# Patient Record
Sex: Female | Born: 1960 | Race: White | Hispanic: No | Marital: Married | Smoking: Never smoker
Health system: Southern US, Community
[De-identification: ages and names within clinical notes are randomized; demographics above are authoritative.]

## PROBLEM LIST (undated history)

## (undated) DIAGNOSIS — R519 Headache, unspecified: Secondary | ICD-10-CM

## (undated) DIAGNOSIS — R51 Headache: Secondary | ICD-10-CM

## (undated) DIAGNOSIS — D649 Anemia, unspecified: Secondary | ICD-10-CM

## (undated) DIAGNOSIS — I700 Atherosclerosis of aorta: Secondary | ICD-10-CM

## (undated) DIAGNOSIS — K760 Fatty (change of) liver, not elsewhere classified: Secondary | ICD-10-CM

## (undated) DIAGNOSIS — A159 Respiratory tuberculosis unspecified: Secondary | ICD-10-CM

## (undated) DIAGNOSIS — I10 Essential (primary) hypertension: Secondary | ICD-10-CM

## (undated) DIAGNOSIS — K219 Gastro-esophageal reflux disease without esophagitis: Secondary | ICD-10-CM

## (undated) DIAGNOSIS — E039 Hypothyroidism, unspecified: Secondary | ICD-10-CM

## (undated) DIAGNOSIS — T7840XA Allergy, unspecified, initial encounter: Secondary | ICD-10-CM

## (undated) HISTORY — PX: LAPAROSCOPIC APPENDECTOMY: SUR753

## (undated) HISTORY — DX: Essential (primary) hypertension: I10

## (undated) HISTORY — DX: Respiratory tuberculosis unspecified: A15.9

## (undated) HISTORY — DX: Allergy, unspecified, initial encounter: T78.40XA

## (undated) HISTORY — PX: BACK SURGERY: SHX140

## (undated) HISTORY — DX: Anemia, unspecified: D64.9

---

## 1998-05-12 ENCOUNTER — Emergency Department (HOSPITAL_COMMUNITY): Admission: EM | Admit: 1998-05-12 | Discharge: 1998-05-12 | Payer: Self-pay | Admitting: Emergency Medicine

## 2001-10-15 ENCOUNTER — Encounter: Payer: Self-pay | Admitting: Neurosurgery

## 2001-10-21 ENCOUNTER — Encounter: Payer: Self-pay | Admitting: Neurosurgery

## 2001-10-21 ENCOUNTER — Ambulatory Visit (HOSPITAL_COMMUNITY): Admission: RE | Admit: 2001-10-21 | Discharge: 2001-10-22 | Payer: Self-pay | Admitting: Neurosurgery

## 2001-11-24 ENCOUNTER — Ambulatory Visit (HOSPITAL_COMMUNITY): Admission: RE | Admit: 2001-11-24 | Discharge: 2001-11-24 | Payer: Self-pay | Admitting: Neurosurgery

## 2001-11-24 ENCOUNTER — Encounter: Payer: Self-pay | Admitting: Neurosurgery

## 2002-02-26 ENCOUNTER — Encounter: Payer: Self-pay | Admitting: Neurosurgery

## 2002-02-26 ENCOUNTER — Encounter: Admission: RE | Admit: 2002-02-26 | Discharge: 2002-02-26 | Payer: Self-pay | Admitting: Neurosurgery

## 2002-03-05 ENCOUNTER — Encounter: Admission: RE | Admit: 2002-03-05 | Discharge: 2002-05-28 | Payer: Self-pay | Admitting: Neurosurgery

## 2002-08-06 ENCOUNTER — Encounter: Admission: RE | Admit: 2002-08-06 | Discharge: 2002-09-09 | Payer: Self-pay | Admitting: Neurosurgery

## 2002-10-09 ENCOUNTER — Encounter: Admission: RE | Admit: 2002-10-09 | Discharge: 2003-01-07 | Payer: Self-pay | Admitting: Anesthesiology

## 2002-11-16 ENCOUNTER — Encounter: Admission: RE | Admit: 2002-11-16 | Discharge: 2002-11-16 | Payer: Self-pay | Admitting: *Deleted

## 2002-11-16 ENCOUNTER — Encounter: Payer: Self-pay | Admitting: *Deleted

## 2004-06-28 ENCOUNTER — Ambulatory Visit (HOSPITAL_COMMUNITY): Admission: RE | Admit: 2004-06-28 | Discharge: 2004-06-29 | Payer: Self-pay | Admitting: Neurosurgery

## 2004-08-02 ENCOUNTER — Encounter: Admission: RE | Admit: 2004-08-02 | Discharge: 2004-08-02 | Payer: Self-pay | Admitting: Neurosurgery

## 2004-08-02 IMAGING — CR DG CERVICAL SPINE 1V
1 series · 1 of 1 positions shown · non-contrast
Comparison: none

CLINICAL DATA: Anterior cervical diskectomy and fusion, follow up evaluation.
 LATERAL CERVICAL SPINE ? [DATE] 
 There has been anterior cervical diskectomy and fusion at the C3-4 and C4-5 levels.  Position and alignment appear satisfactory, and anterior plate and screws appear intact.  There has been previous fusion at the C5-6 level, and there is C6-7 degenerative disk space narrowing noted.  
 IMPRESSION
 Stable appearance of anterior cervical diskectomy and fusion at C3-4 and C4-5 levels, as discussed.

[view not recorded]
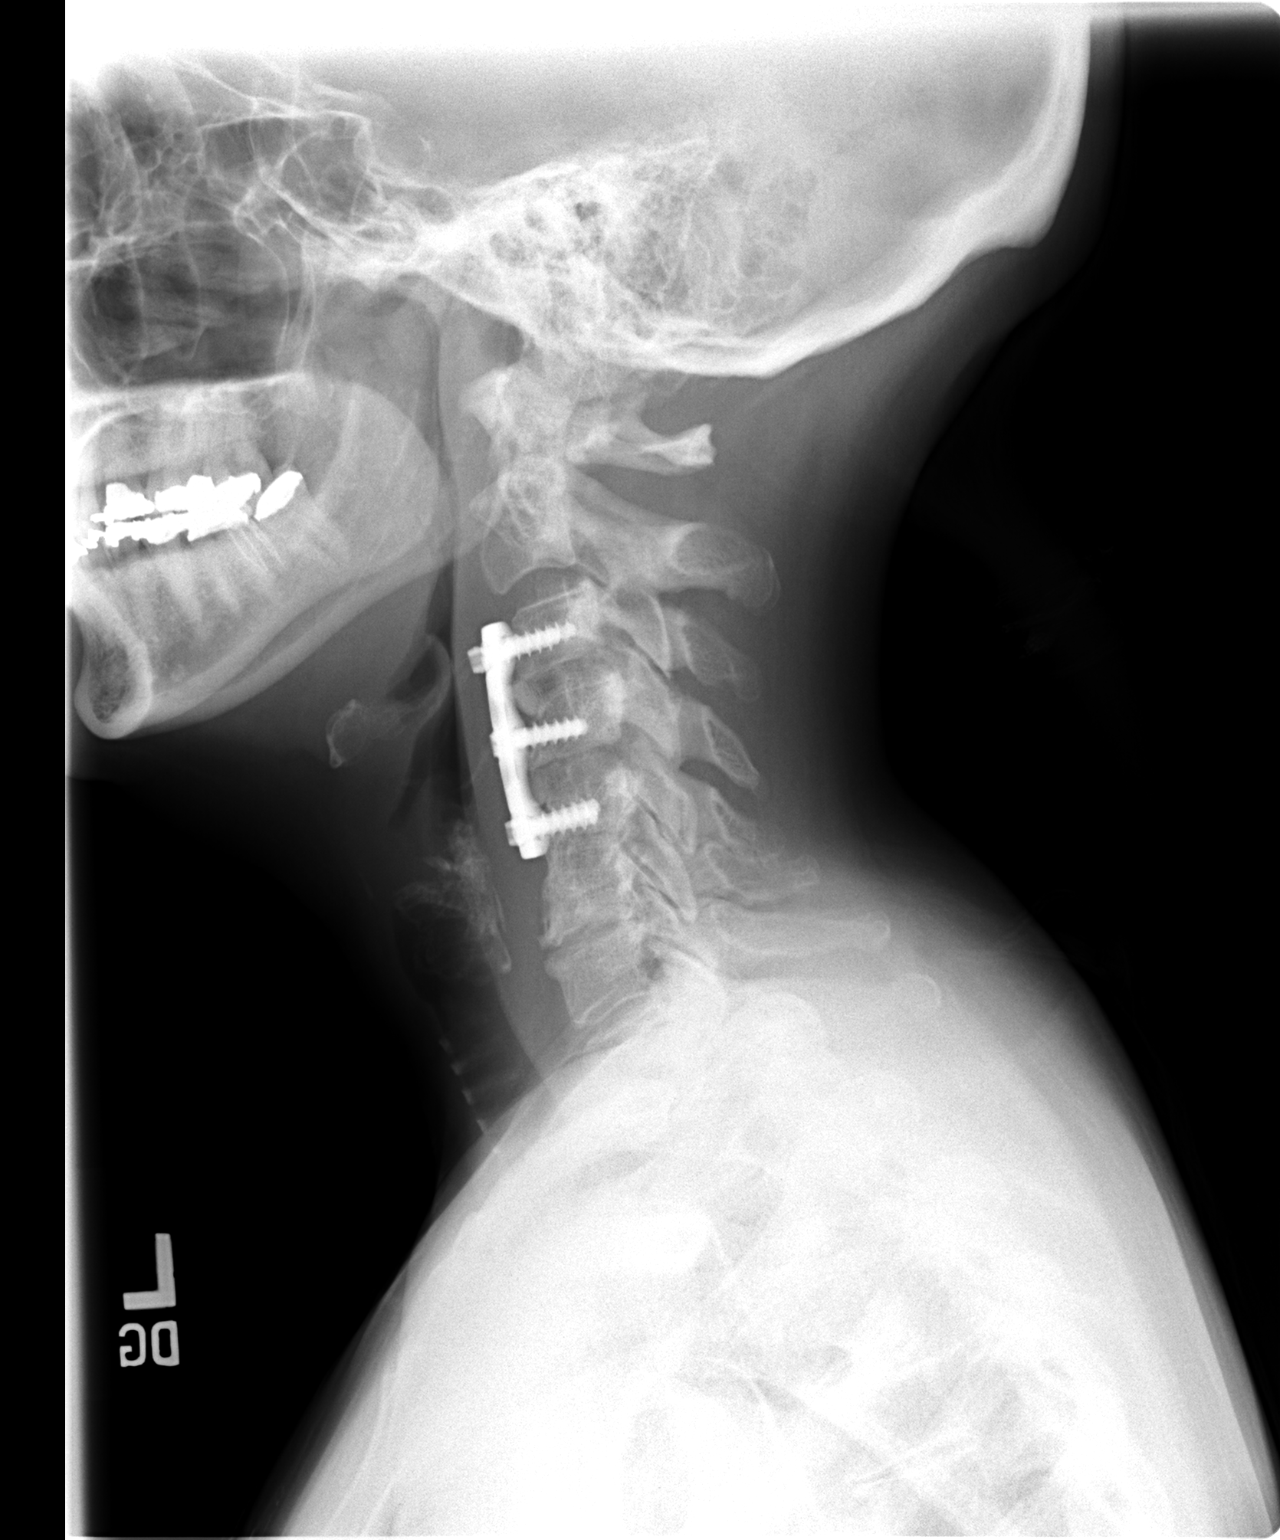

[1 of 1 positions shown; findings below may reference images not displayed]

## 2004-08-07 ENCOUNTER — Emergency Department (HOSPITAL_COMMUNITY): Admission: EM | Admit: 2004-08-07 | Discharge: 2004-08-08 | Payer: Self-pay | Admitting: Emergency Medicine

## 2004-08-16 ENCOUNTER — Emergency Department (HOSPITAL_COMMUNITY): Admission: EM | Admit: 2004-08-16 | Discharge: 2004-08-16 | Payer: Self-pay | Admitting: Emergency Medicine

## 2004-09-14 ENCOUNTER — Encounter: Admission: RE | Admit: 2004-09-14 | Discharge: 2004-09-14 | Payer: Self-pay | Admitting: Neurosurgery

## 2004-09-14 IMAGING — CR DG CERVICAL SPINE 2 OR 3 VIEWS
2 series · 2 of 2 positions shown · non-contrast
Comparison: none

CLINICAL DATA: Anterior cervical spine fusion.  Cervical pain.
 CERVICAL SPINE:
 Two views of the cervical spine were obtained.  These are compared to a lateral cervical spine of [DATE].  Anterior fusion at C3-4 and C4-5 appear stable.  It is difficult to exclude slight compression of the interbody fusion plug, but no change in alignment is seen.  Solid fusion is noted at C5-6.  There is degenerative disk disease at C6-7.

[view not recorded (1 of 2)]
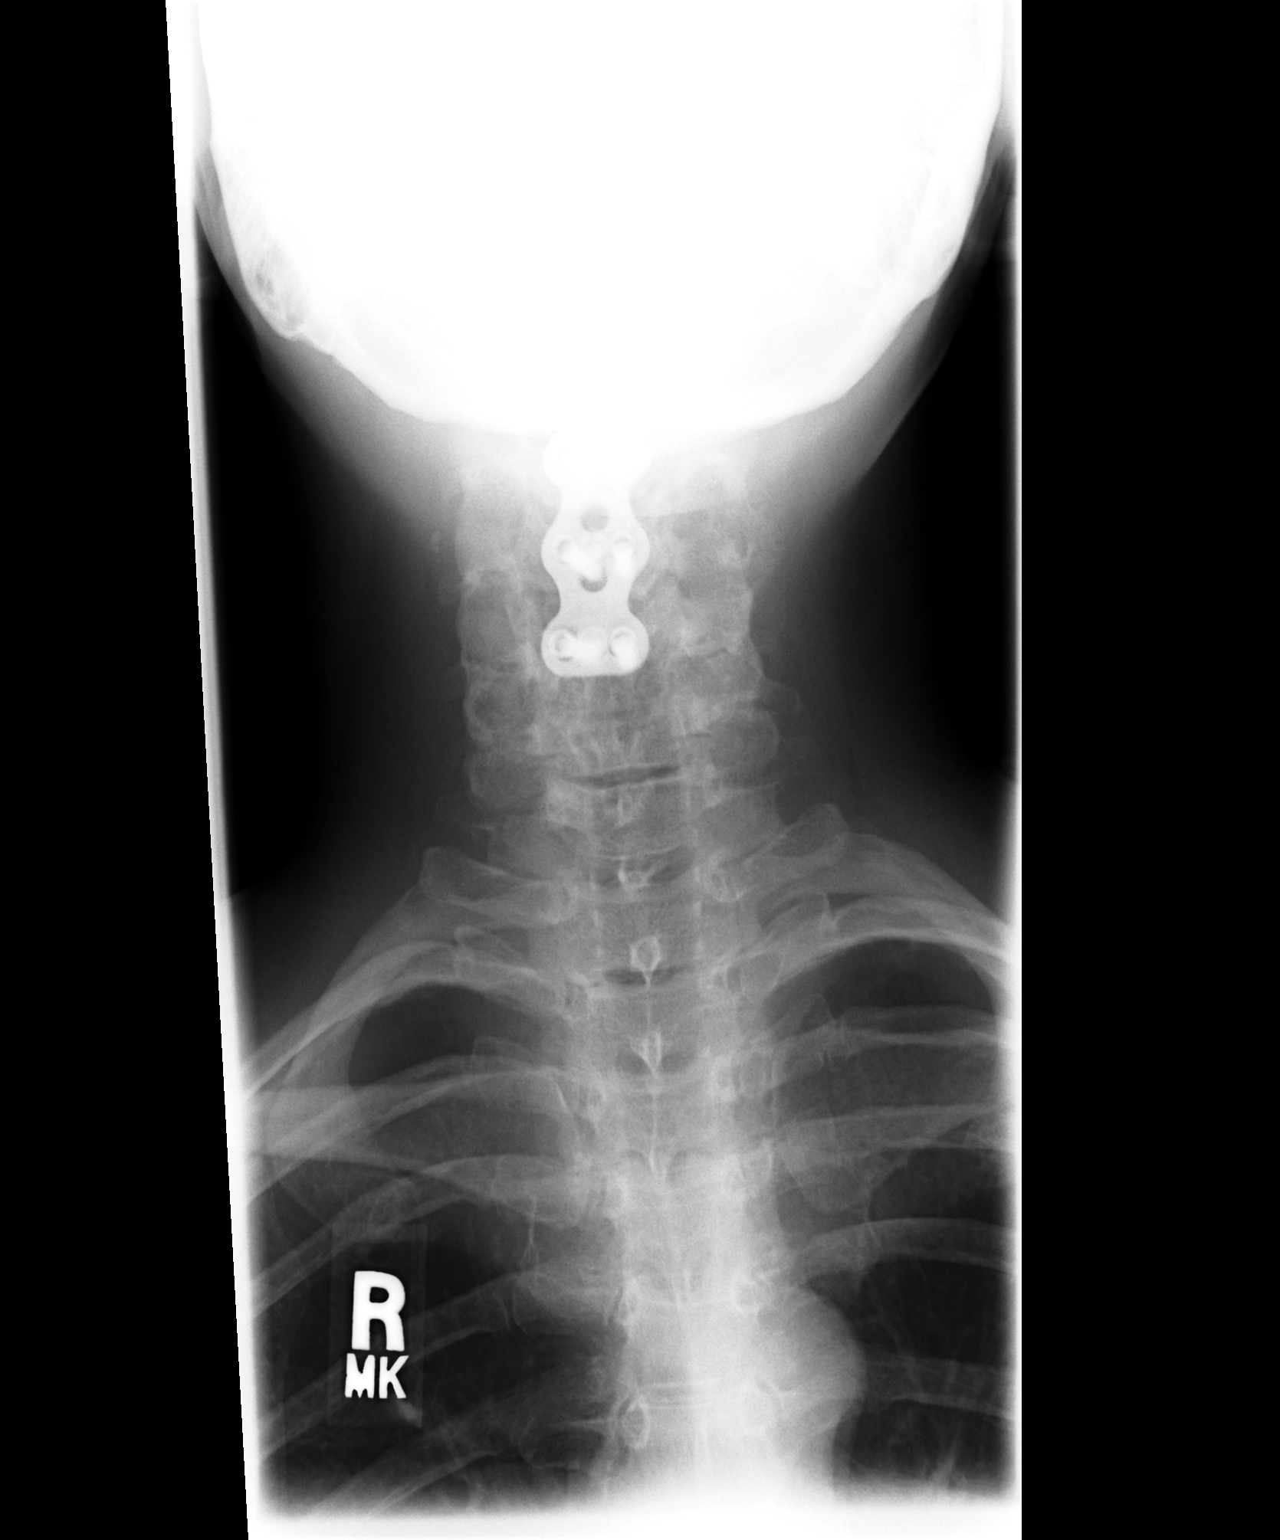

[view not recorded (2 of 2)]
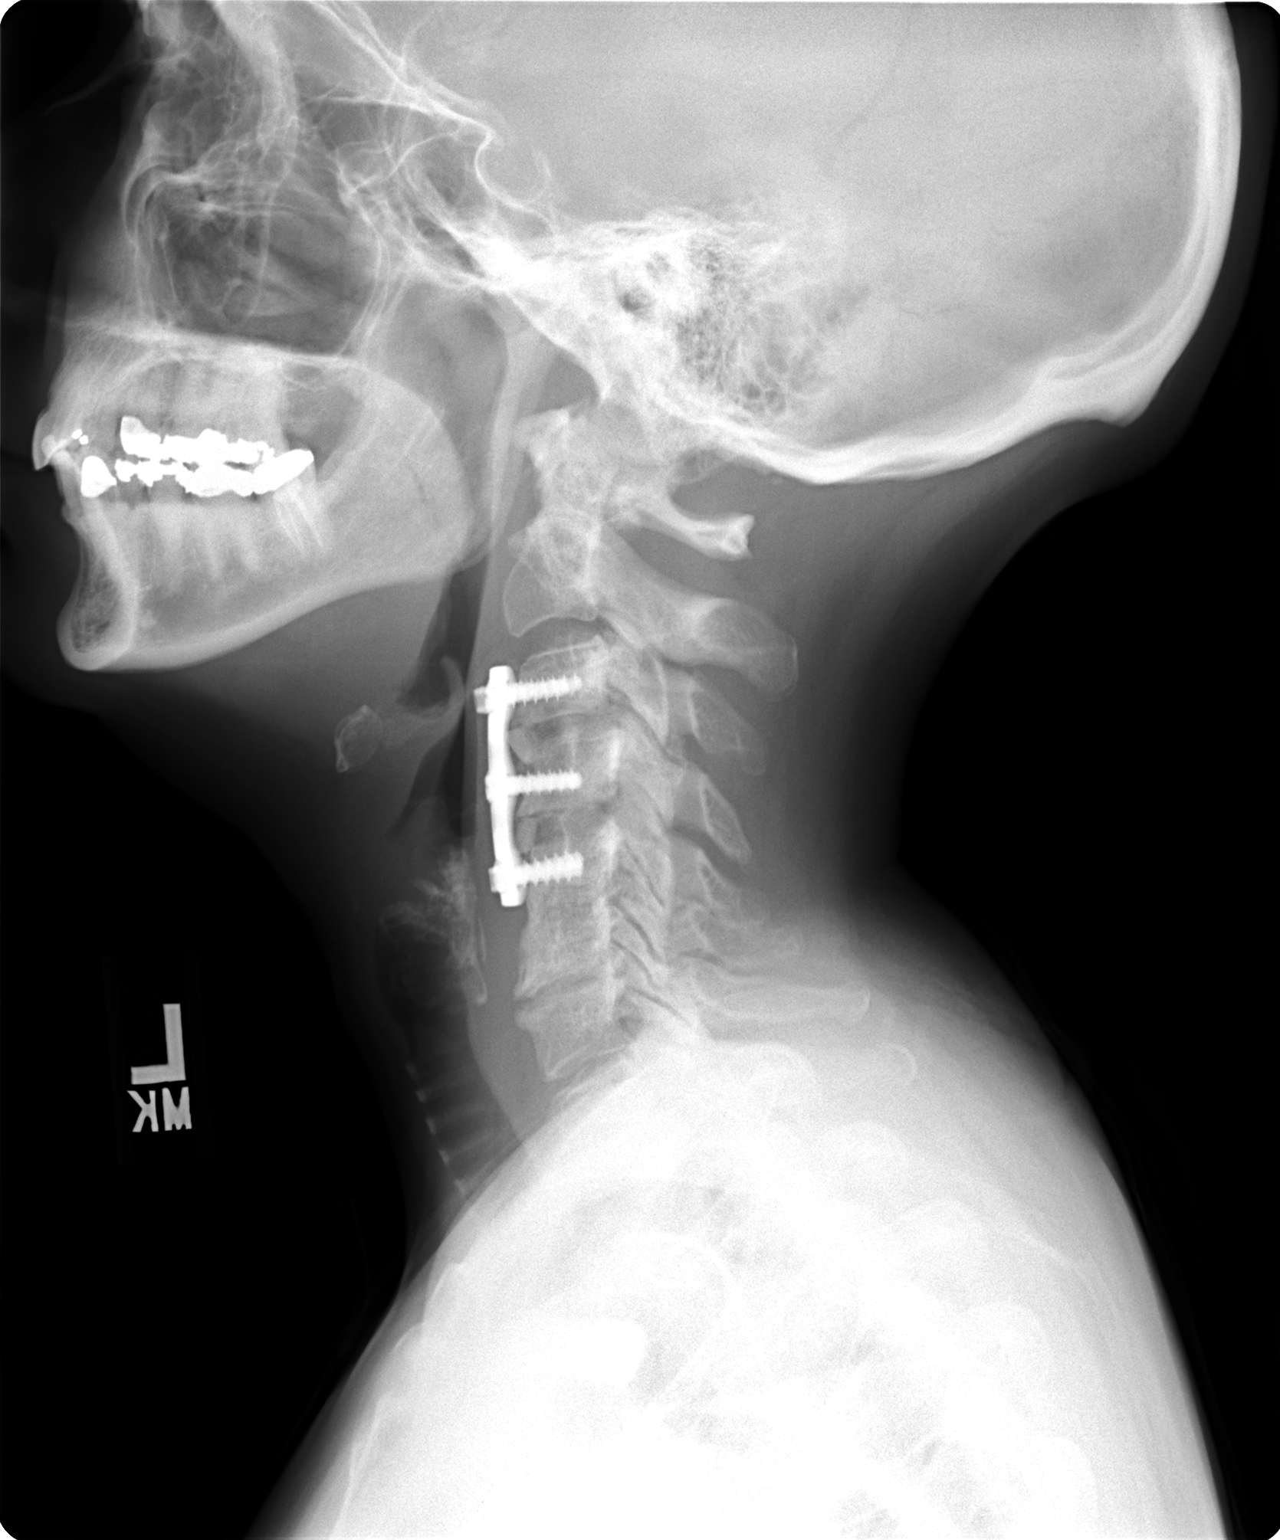

[2 of 2 positions shown; findings below may reference images not displayed]

IMPRESSION: 1.  No change in anterior fusion at C3-4 and C4-5.  Cannot exclude slight compression of bone plug.
 2.  Solid fusion at C5-6.

## 2005-03-06 ENCOUNTER — Encounter: Admission: RE | Admit: 2005-03-06 | Discharge: 2005-03-06 | Payer: Self-pay | Admitting: Neurosurgery

## 2005-03-06 IMAGING — CR DG CERVICAL SPINE 1V
1 series · 1 of 1 positions shown · non-contrast
Comparison: none

CLINICAL DATA: Surgery [DATE] ? Follow up. 
 CERVICAL SPINE: 
 A single lateral view of the cervical spine is compared to a lateral view of [DATE].  Anterior fusion at C3-4 and C4-5 is unchanged.  Interbody fusion plugs are unchanged in position and in height as is the anterior metallic fixation plate.  Solid fusion at C5-6 again is noted with degenerative disc disease at C6-7.  Slight straightened alignment is present.

[w c-spine lat]
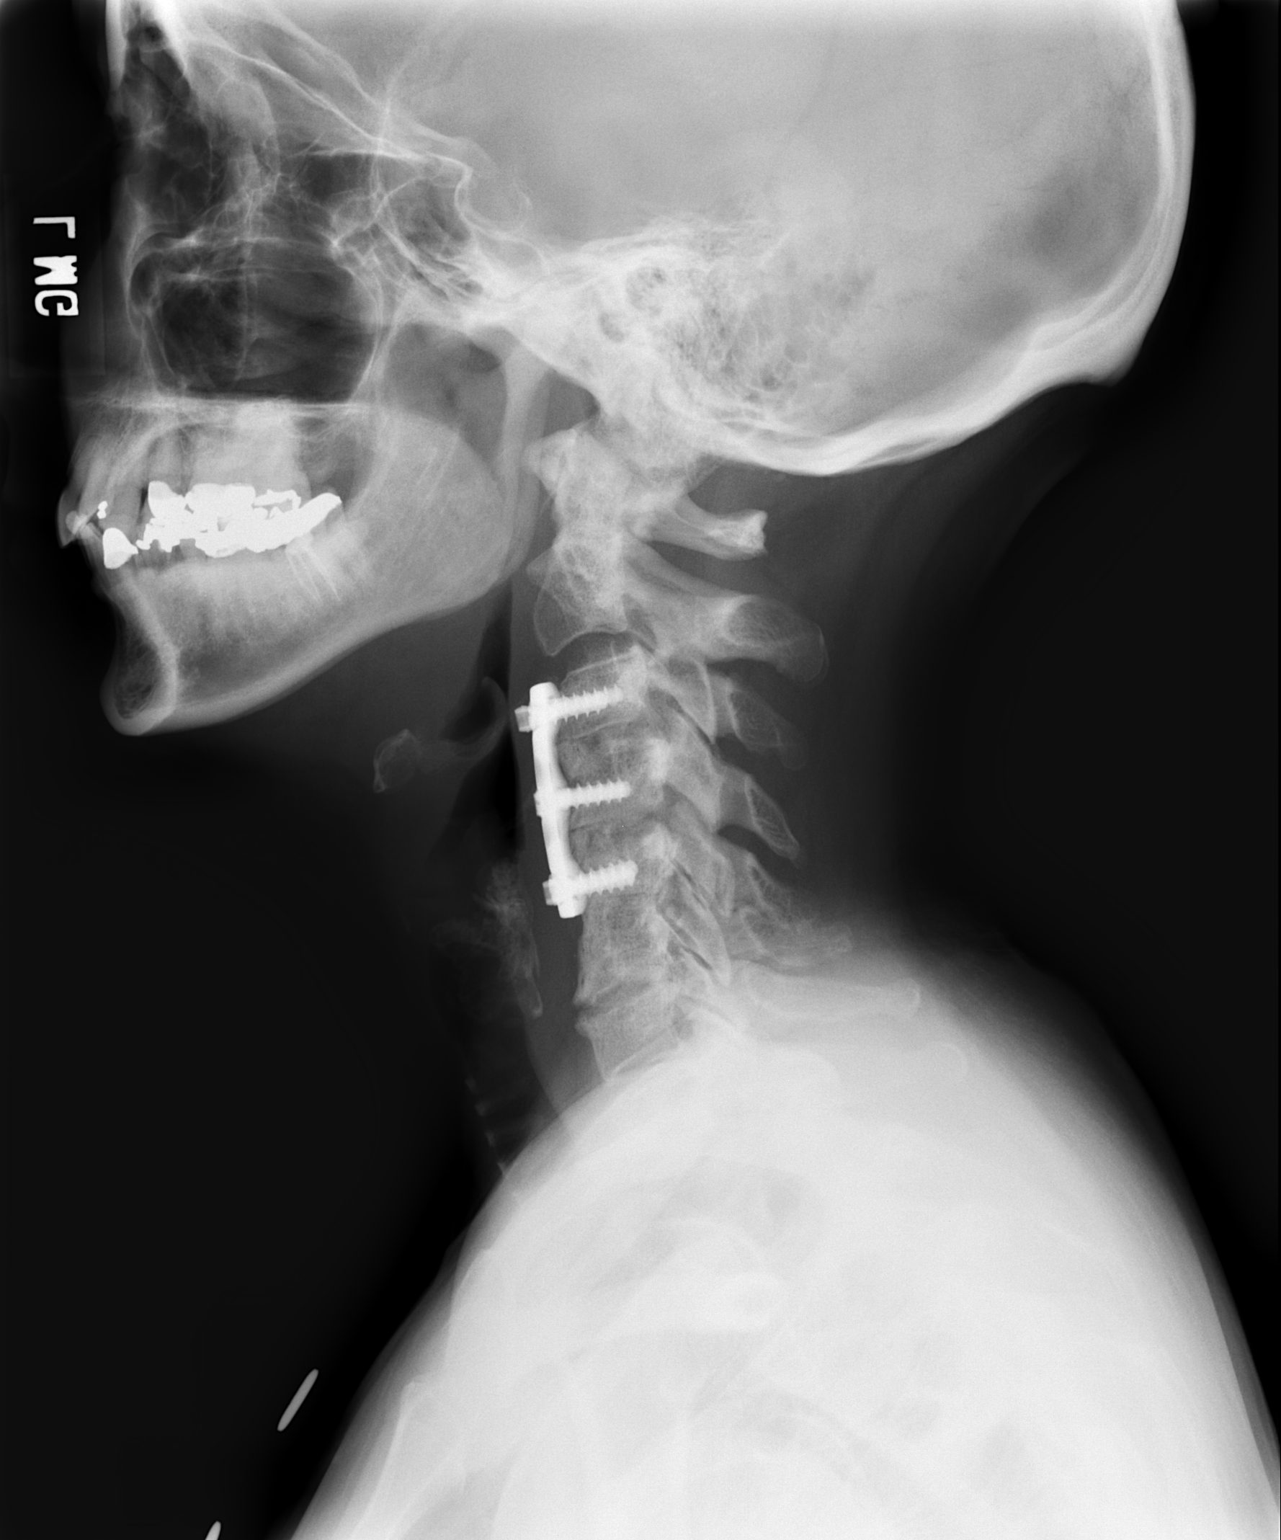

[1 of 1 positions shown; findings below may reference images not displayed]

IMPRESSION: Stable appearance of anterior fusions at C3-4 and C5-6.  Straightened alignment.

## 2007-06-13 ENCOUNTER — Ambulatory Visit (HOSPITAL_COMMUNITY): Admission: RE | Admit: 2007-06-13 | Discharge: 2007-06-13 | Payer: Self-pay | Admitting: Emergency Medicine

## 2007-06-13 IMAGING — US US ABDOMEN COMPLETE
1 series · 14 of 25 positions shown · non-contrast
Comparison: CT dated [DATE]

CLINICAL DATA: Elevated liver function tests.

COMPLETE ABDOMEN ULTRASOUND
TECHNIQUE: Complete abdominal ultrasound examination was performed including
evaluation of the liver, gallbladder, bile ducts, pancreas, kidneys, spleen,
IVC, and abdominal aorta.

[Series 1: unknown · 0.28mm/px · 14 of 68 slices shown]
[im 1/68]
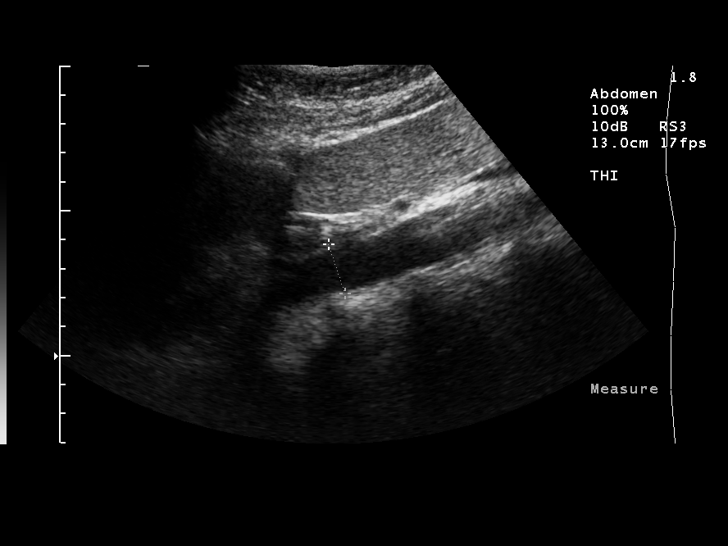
[im 6/68]
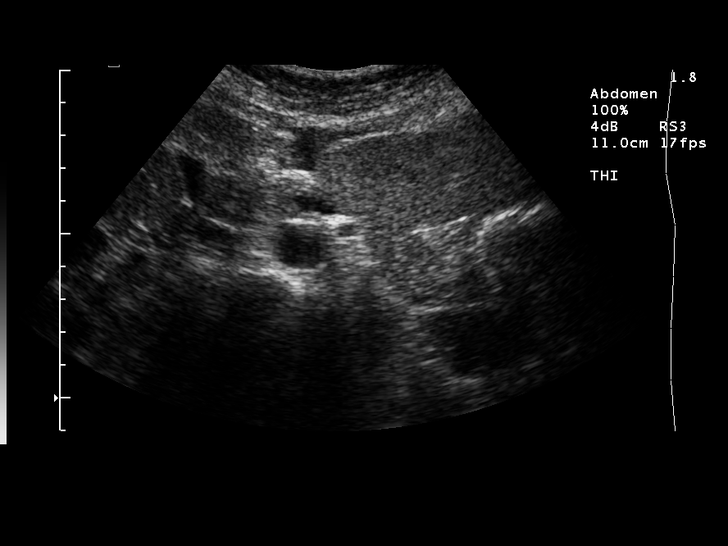
[im 12/68]
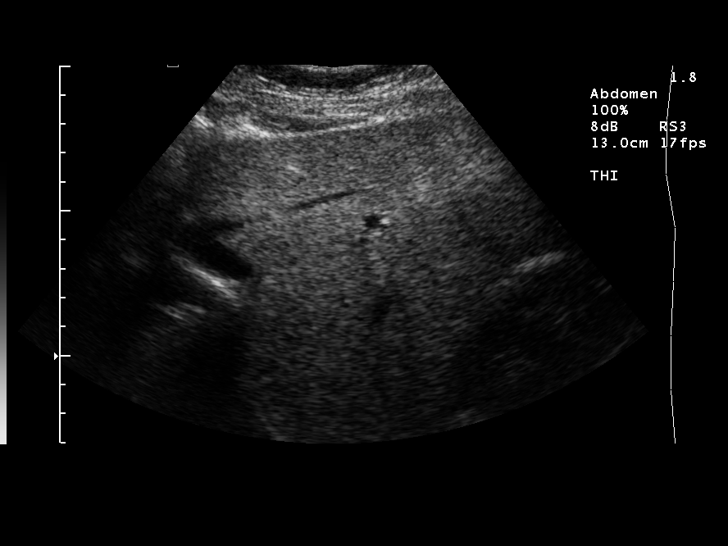
[im 17/68]
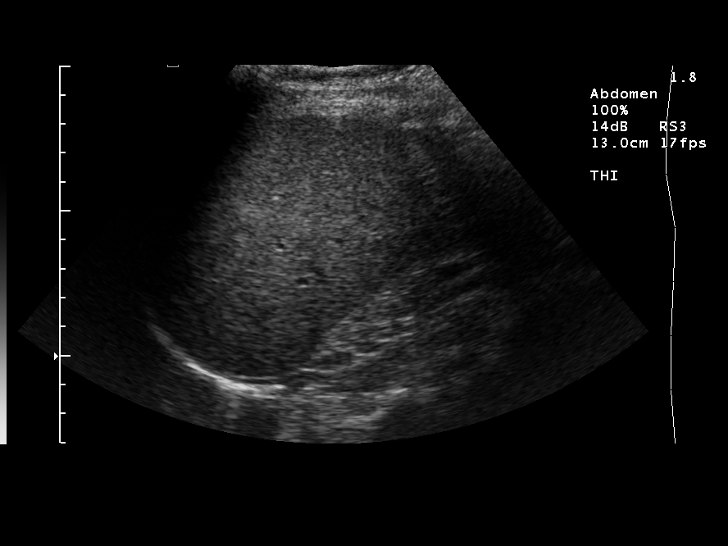
[im 23/68]
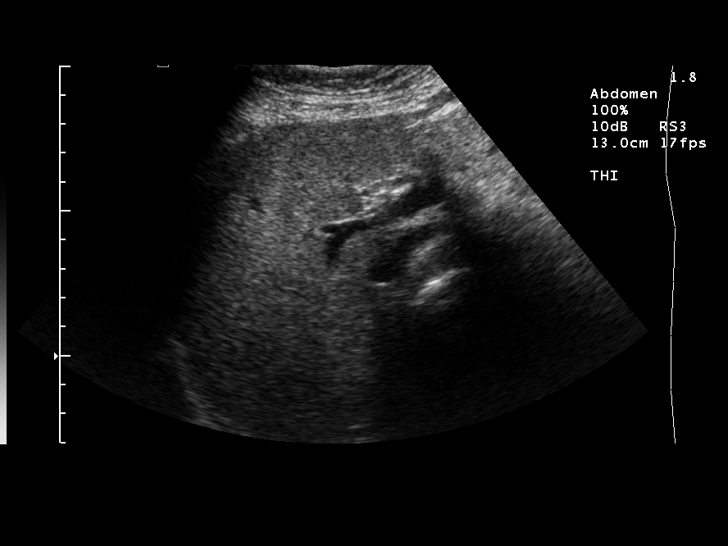
[im 26/68]
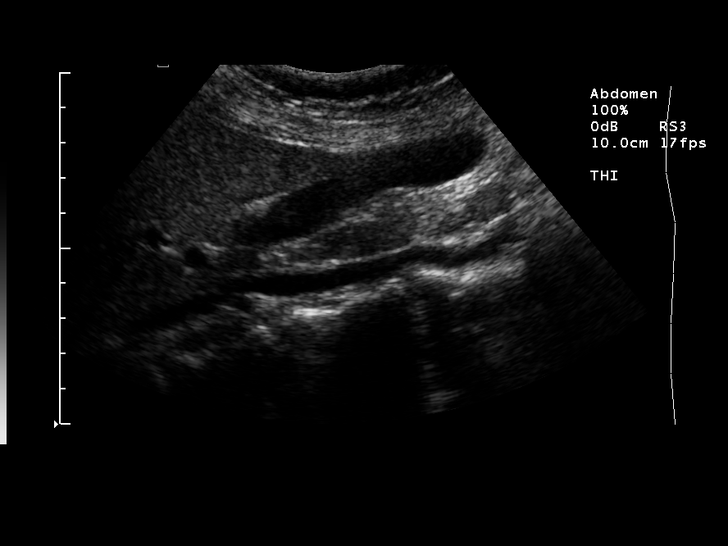
[im 31/68]
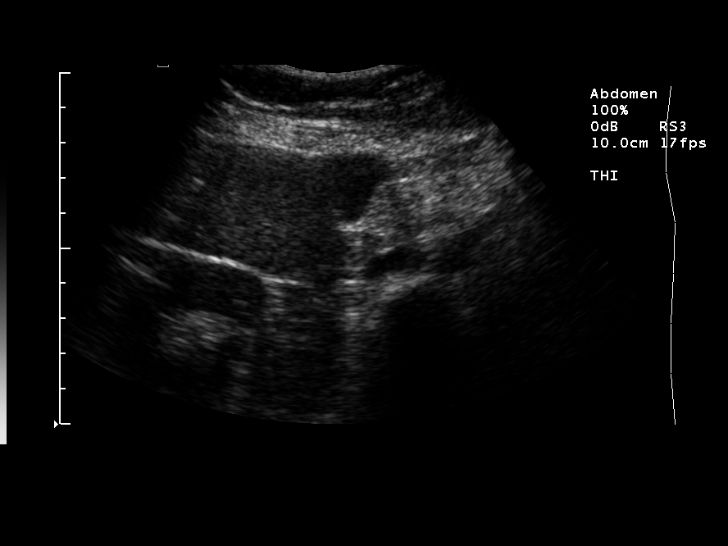
[im 37/68]
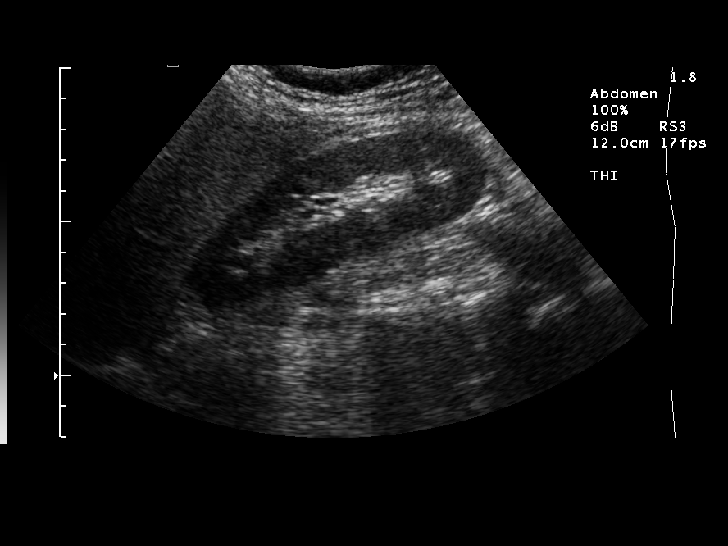
[im 42/68]
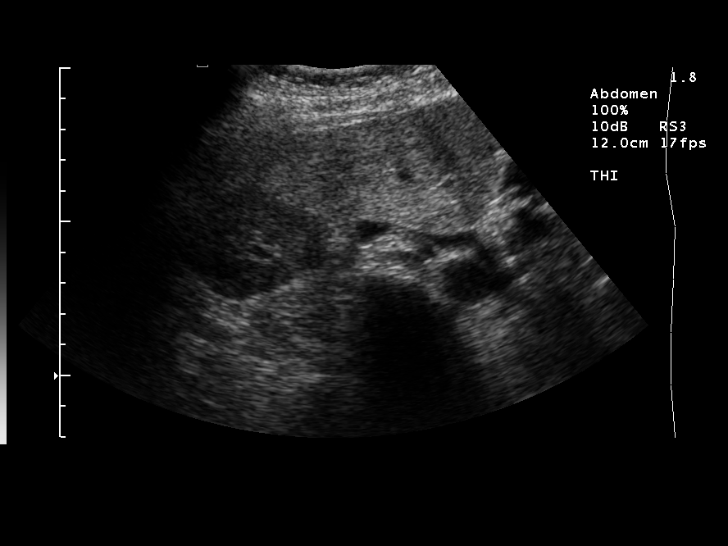
[im 45/68]
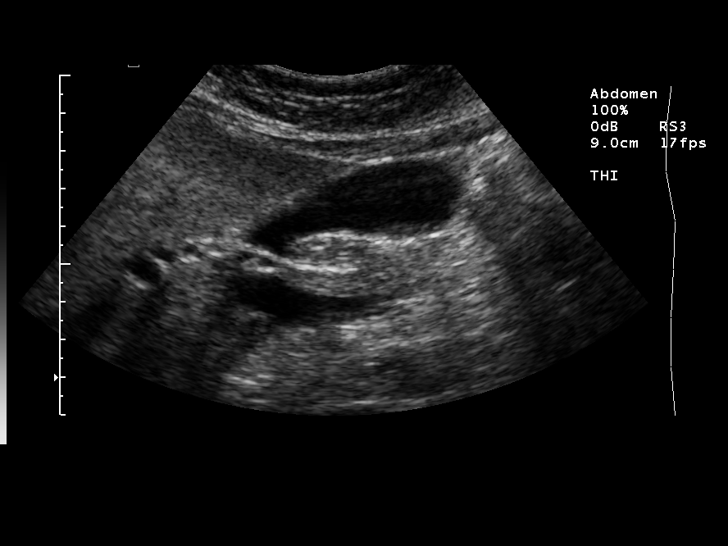
[im 51/68]
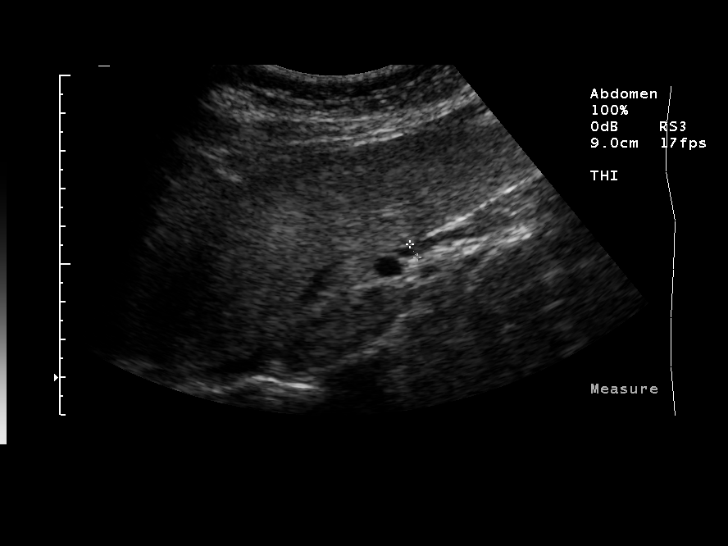
[im 56/68]
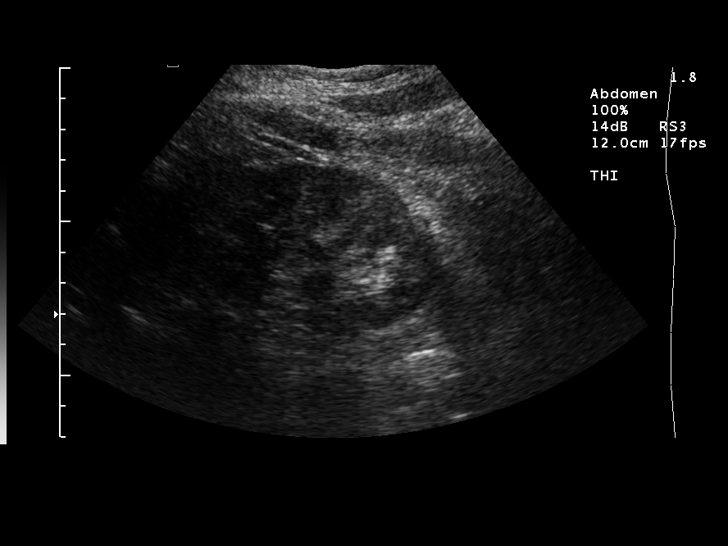
[im 62/68]
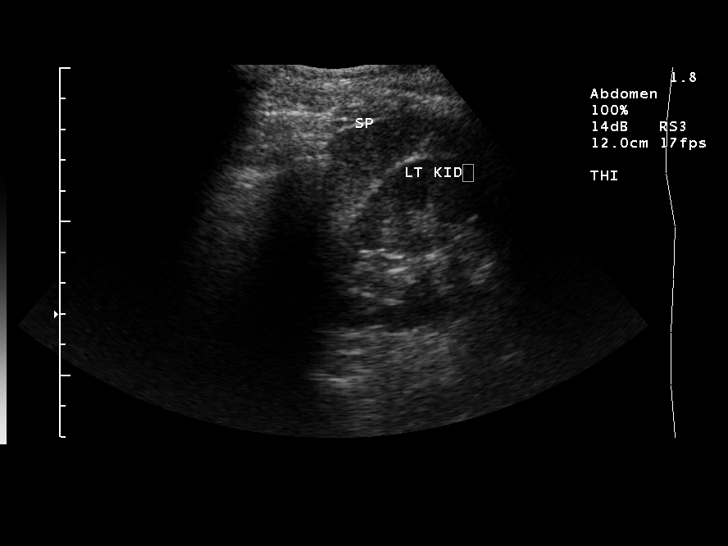
[im 68/68]
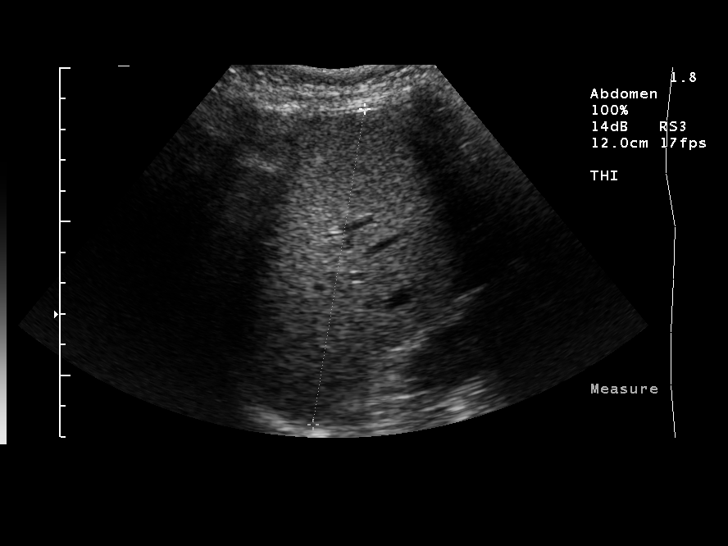

[14 of 25 positions shown; findings below may reference images not displayed]

FINDINGS: Mildly echogenic liver, corresponding to diffuse fatty infiltration
on the previous CT. The gallbladder, spleen, pancreas, kidneys, abdominal aorta
and inferior vena cava have normal appearances. No gallstones, biliary ductal
dilatation or free peritoneal fluid.  The common duct measures 4.0 mm in maximum
diameter proximally.

IMPRESSION

Diffuse fatty infiltration of the liver. Otherwise, normal examination.

## 2007-06-17 ENCOUNTER — Ambulatory Visit: Payer: Self-pay | Admitting: Gastroenterology

## 2007-12-22 ENCOUNTER — Inpatient Hospital Stay (HOSPITAL_COMMUNITY): Admission: EM | Admit: 2007-12-22 | Discharge: 2007-12-26 | Payer: Self-pay | Admitting: Emergency Medicine

## 2008-03-01 DIAGNOSIS — R7989 Other specified abnormal findings of blood chemistry: Secondary | ICD-10-CM | POA: Insufficient documentation

## 2008-03-01 DIAGNOSIS — F411 Generalized anxiety disorder: Secondary | ICD-10-CM

## 2008-03-01 DIAGNOSIS — R945 Abnormal results of liver function studies: Secondary | ICD-10-CM

## 2008-03-01 DIAGNOSIS — F1010 Alcohol abuse, uncomplicated: Secondary | ICD-10-CM | POA: Insufficient documentation

## 2008-03-01 DIAGNOSIS — F419 Anxiety disorder, unspecified: Secondary | ICD-10-CM | POA: Insufficient documentation

## 2008-12-30 ENCOUNTER — Emergency Department (HOSPITAL_COMMUNITY): Admission: EM | Admit: 2008-12-30 | Discharge: 2008-12-30 | Payer: Self-pay | Admitting: Emergency Medicine

## 2008-12-30 IMAGING — CR DG HIP COMPLETE 2+V*R*
3 series · 3 of 3 positions shown · non-contrast
Comparison: None

CLINICAL DATA: Right hip pain.

RIGHT HIP - COMPLETE 2+ VIEW

[t pelvis a.p.]
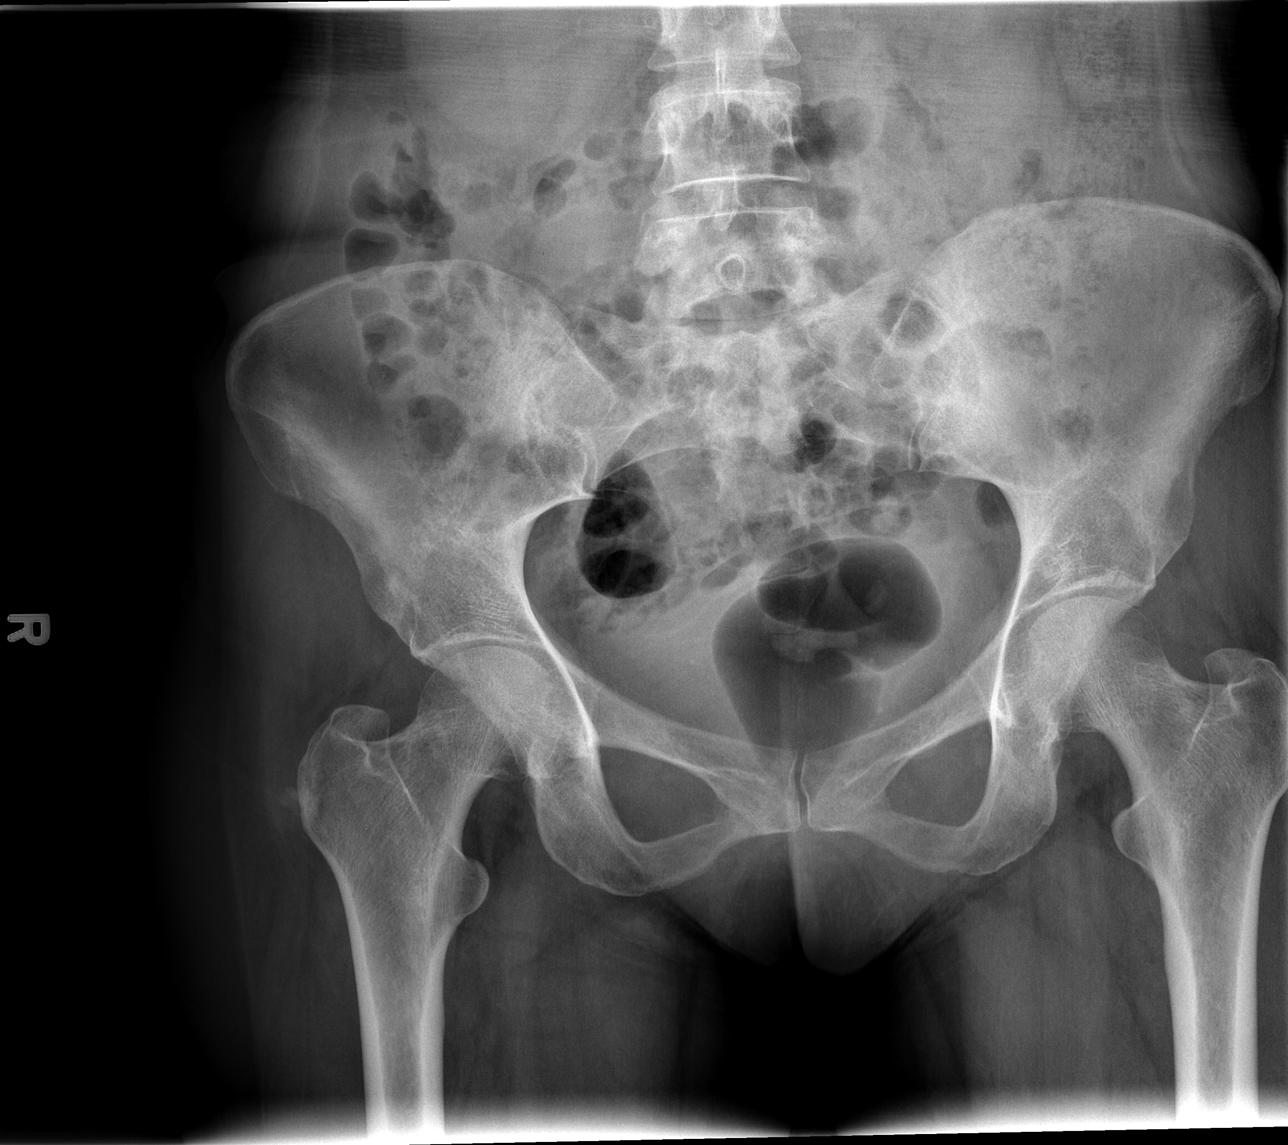

[t hip ap right]
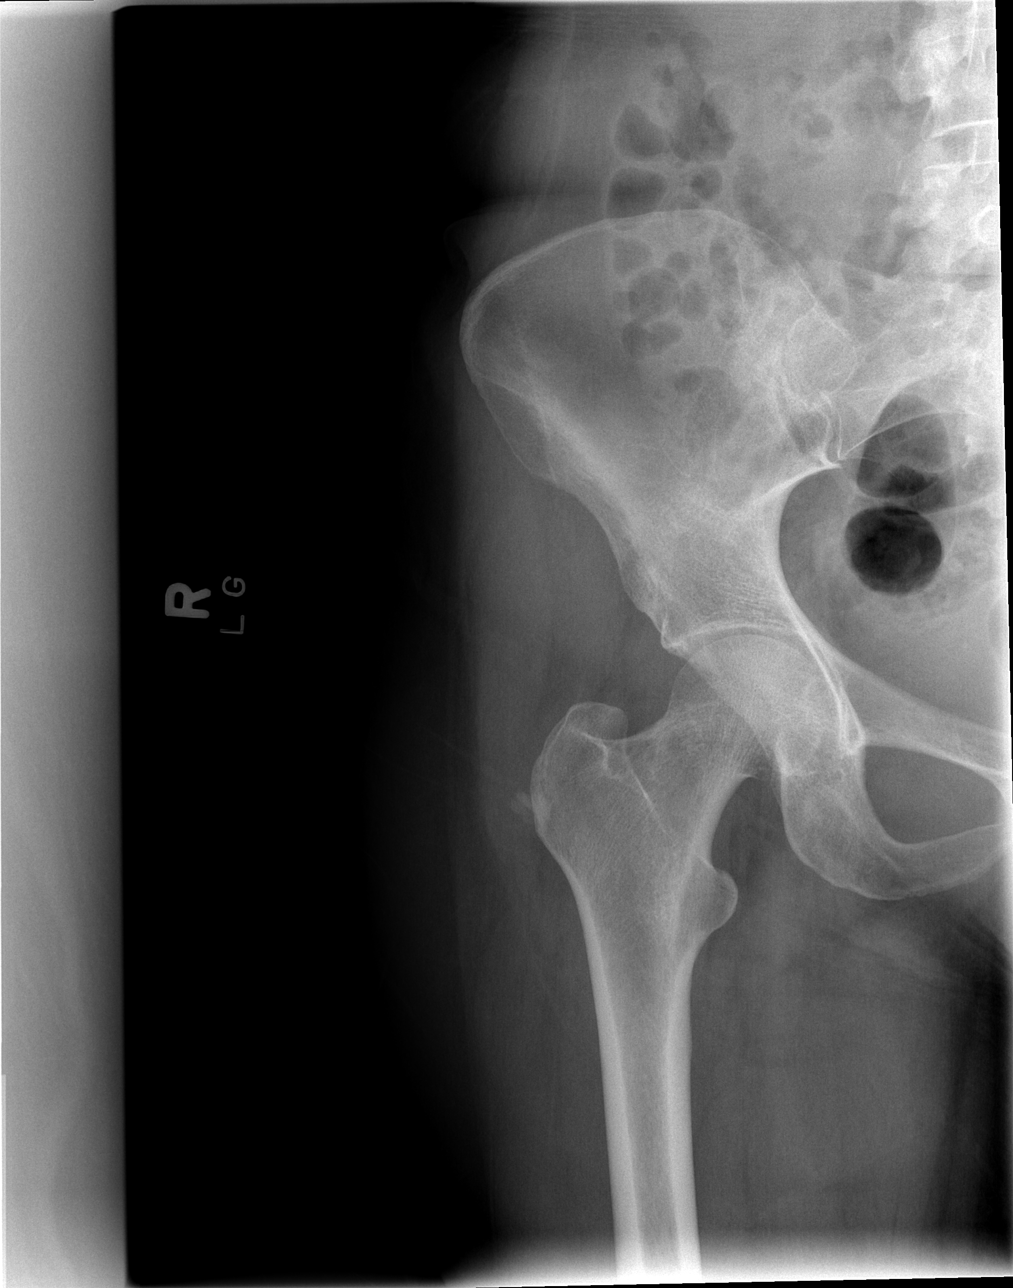

[t hip frog leg right]
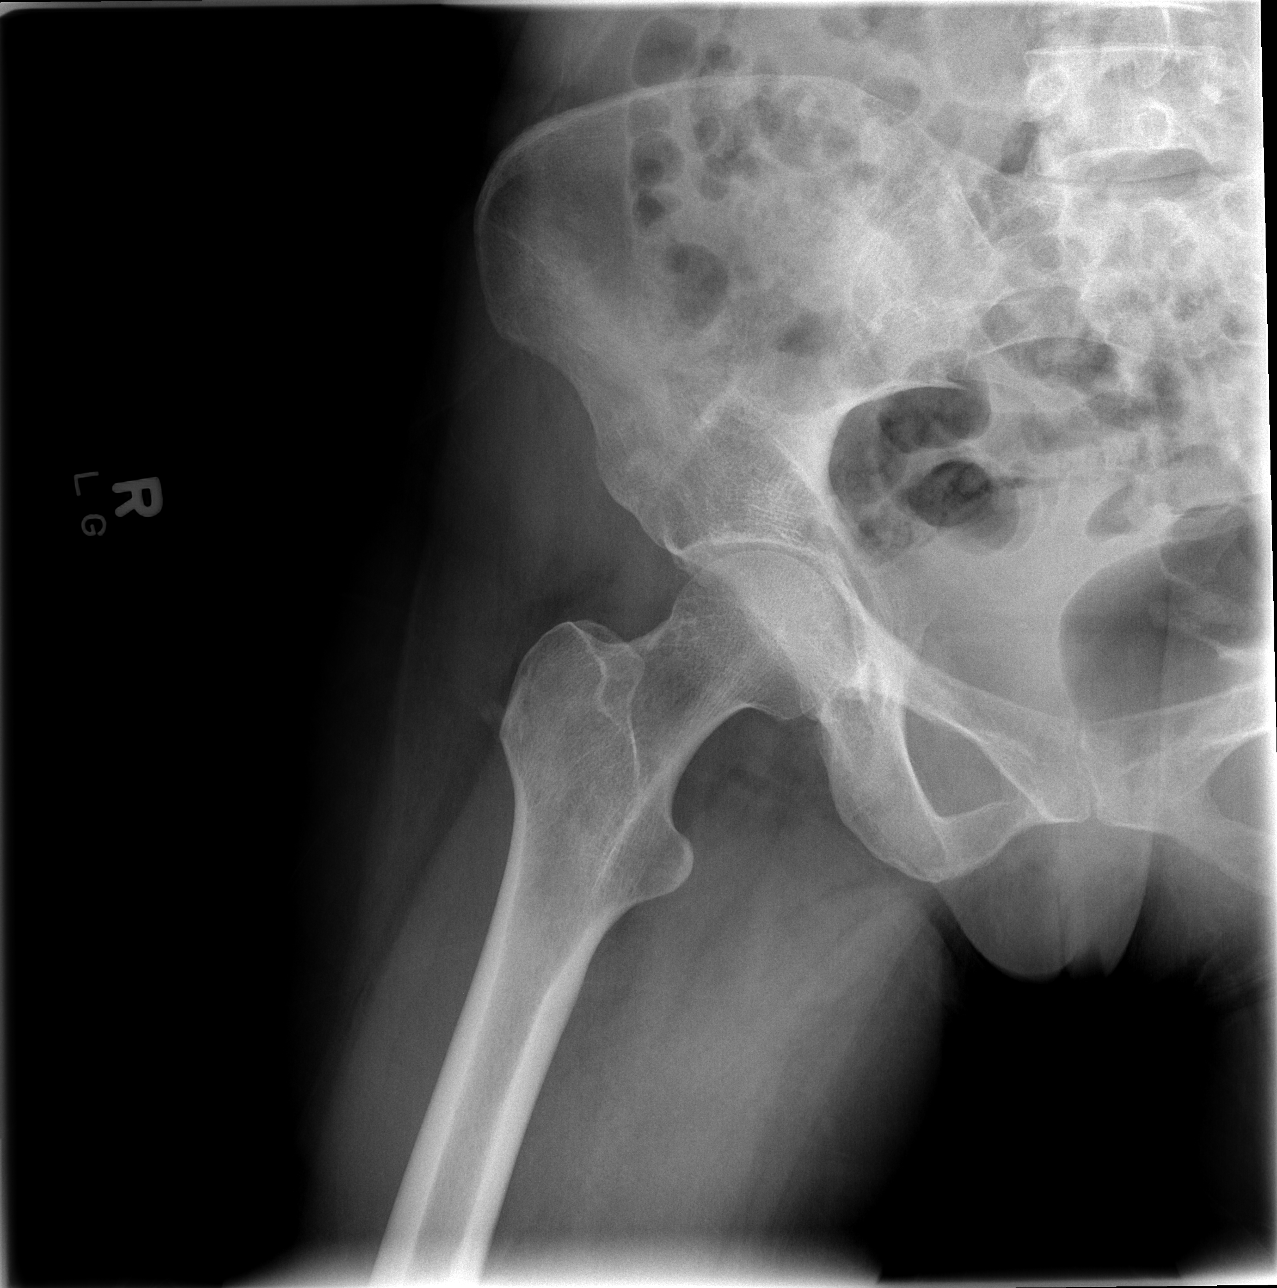

[3 of 3 positions shown; findings below may reference images not displayed]

FINDINGS: Both hips are normally located.  No significant
degenerative changes.  No acute bony findings.  No plain film
evidence for avascular necrosis.  The pubic symphysis and SI joints
are intact.  Small calcifications near the greater trochanter are
likely due to tendon insertion.
IMPRESSION: No acute bony findings.

## 2010-05-07 ENCOUNTER — Emergency Department (HOSPITAL_COMMUNITY): Admission: EM | Admit: 2010-05-07 | Discharge: 2010-05-07 | Payer: Self-pay | Admitting: Emergency Medicine

## 2010-05-07 IMAGING — CT CT HEAD W/O CM
1 of 2 series · 16 of 30 positions shown, 20 images · non-contrast
Comparison: [DATE]

CLINICAL DATA: Fall

CT HEAD WITHOUT CONTRAST
TECHNIQUE: Contiguous axial images were obtained from the base of
the skull through the vertex without contrast.

[Series 3: headseq 2.4 h60s · axial · 0.43mm/px · z∈[-120,+6]mm · 16 of 60 slices shown, 20 images]
[im 4/60  brain]
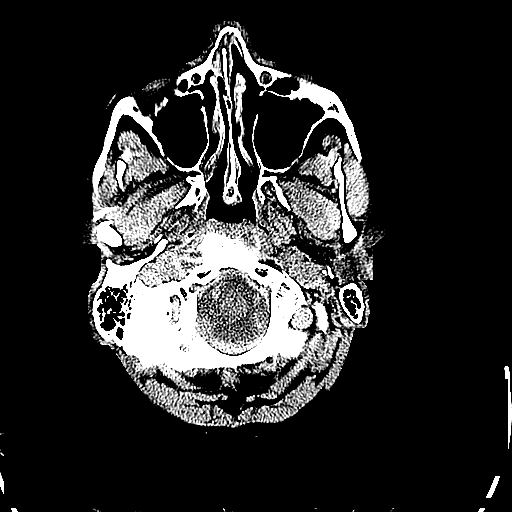
[im 4/60  bone]
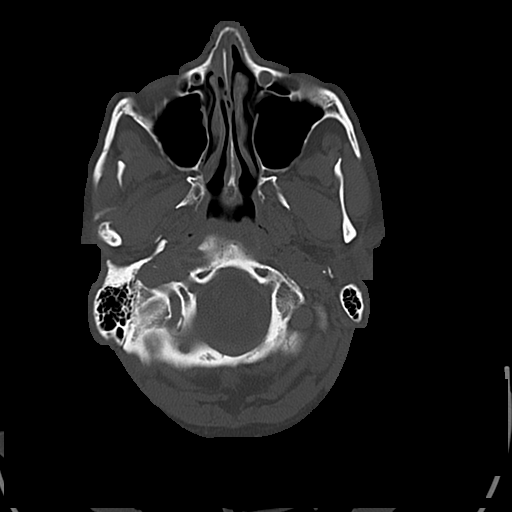
[im 7/60  brain]
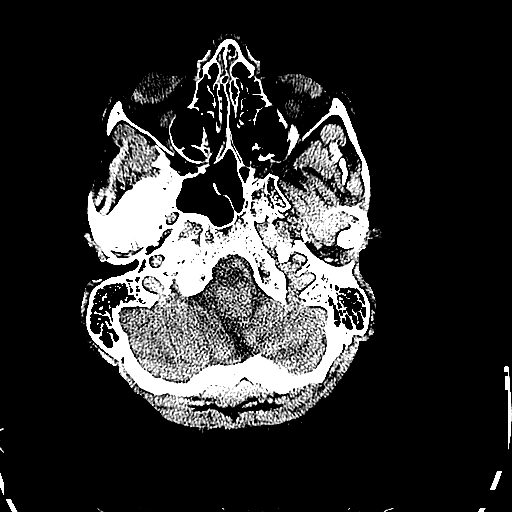
[im 10/60  brain]
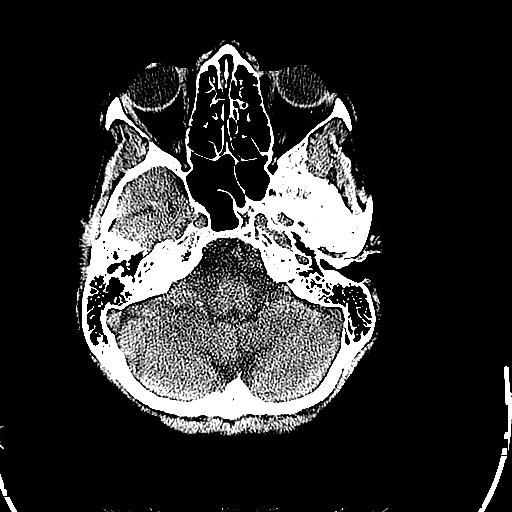
[im 13/60  brain]
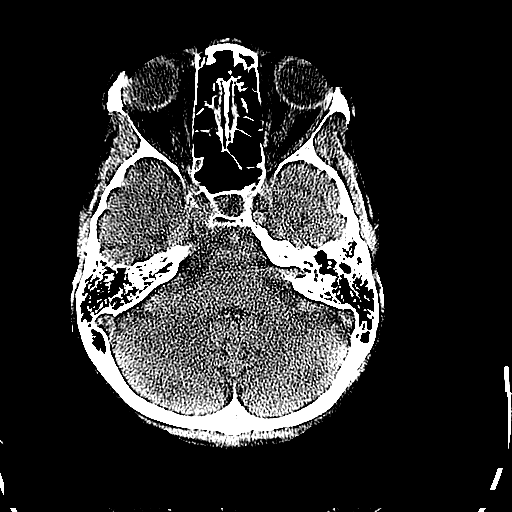
[im 19/60  brain]
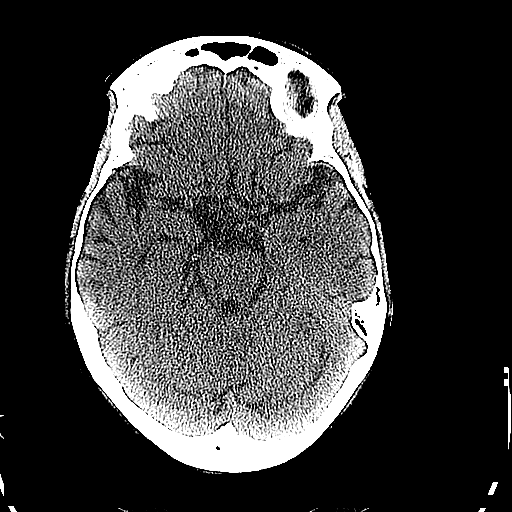
[im 19/60  bone]
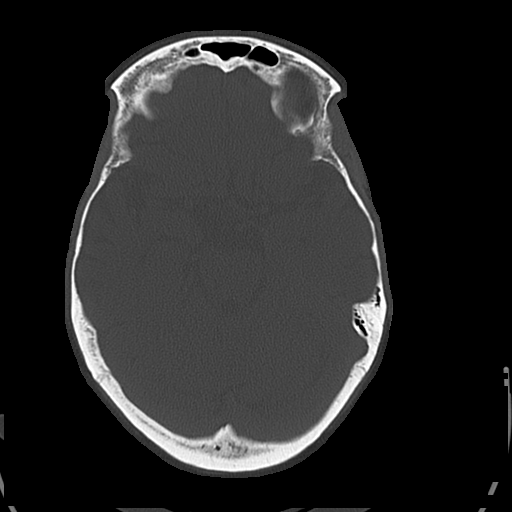
[im 22/60  brain]
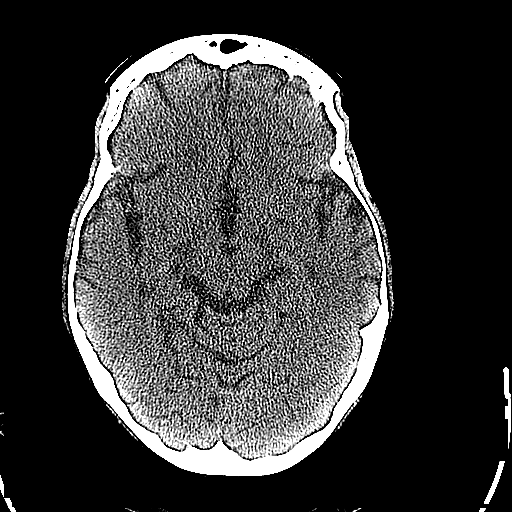
[im 25/60  brain]
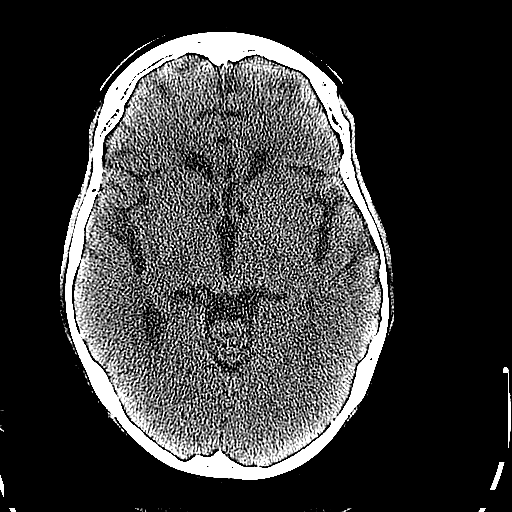
[im 28/60  brain]
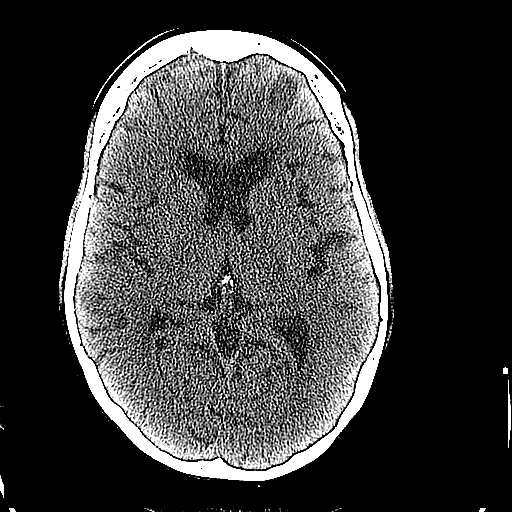
[im 32/60  brain]
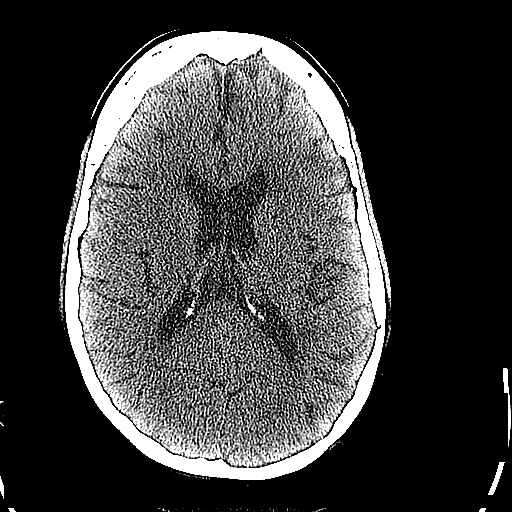
[im 32/60  bone]
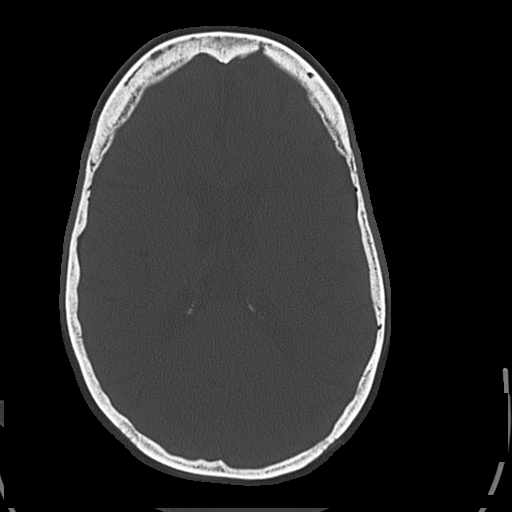
[im 35/60  brain]
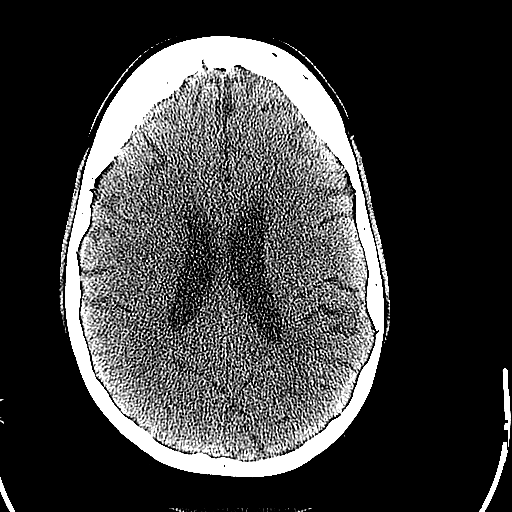
[im 38/60  brain]
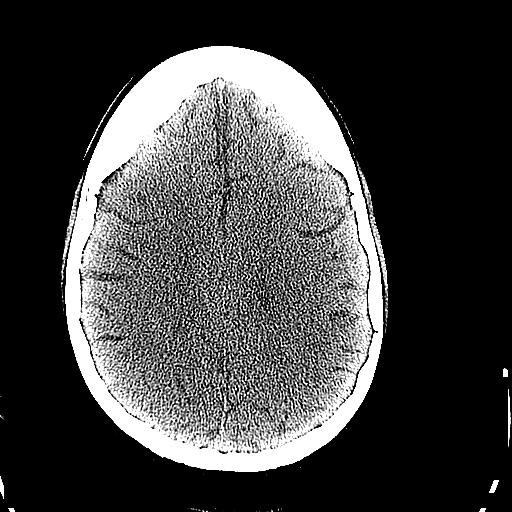
[im 41/60  brain]
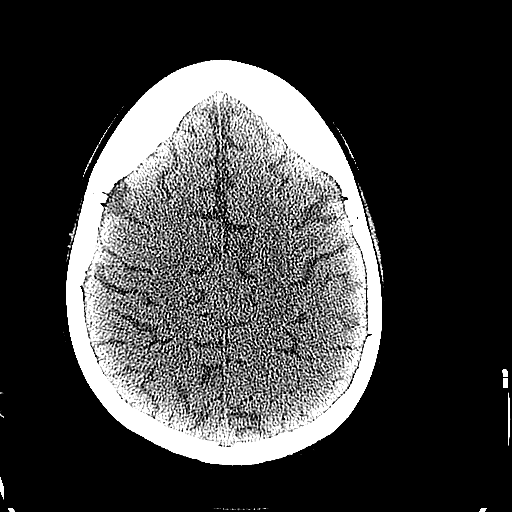
[im 47/60  brain]
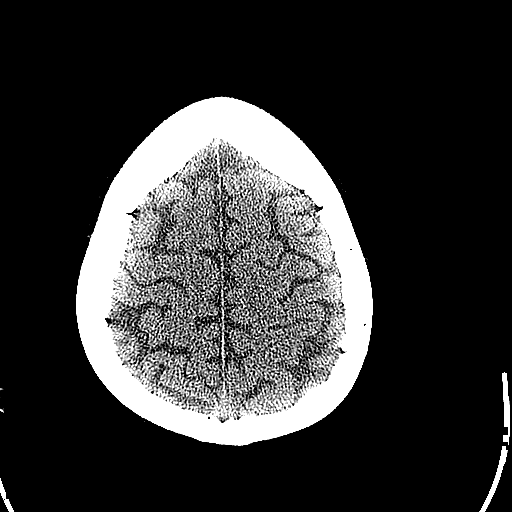
[im 47/60  bone]
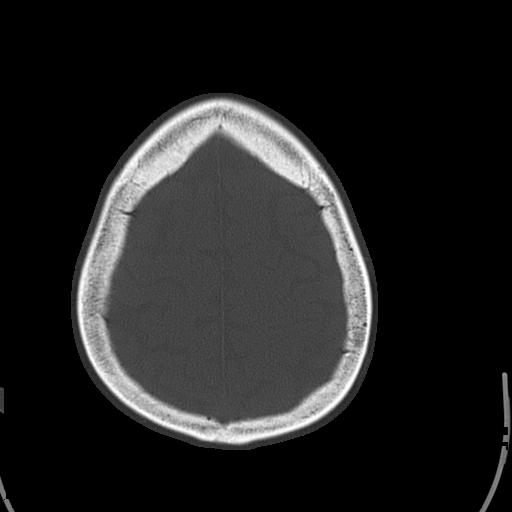
[im 50/60  brain]
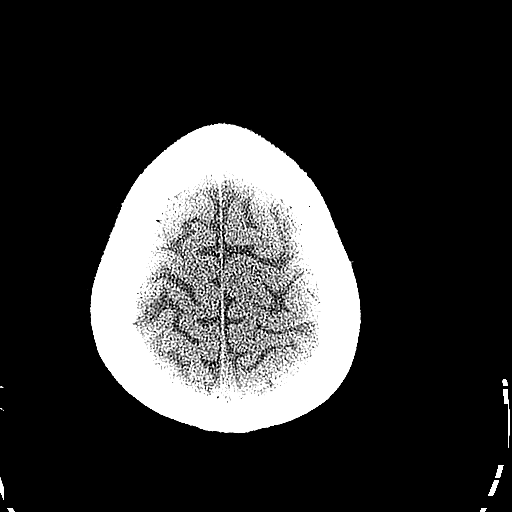
[im 53/60  brain]
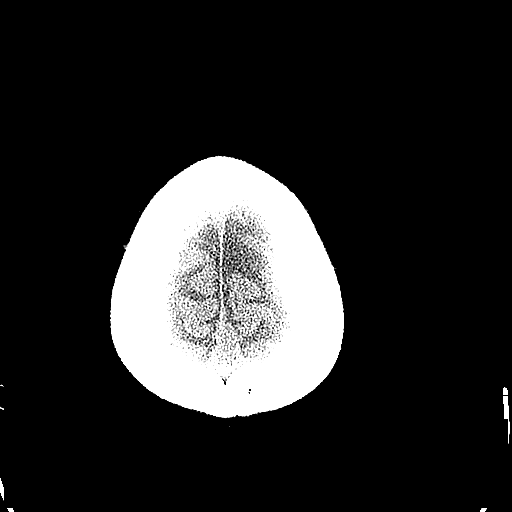
[im 56/60  brain]
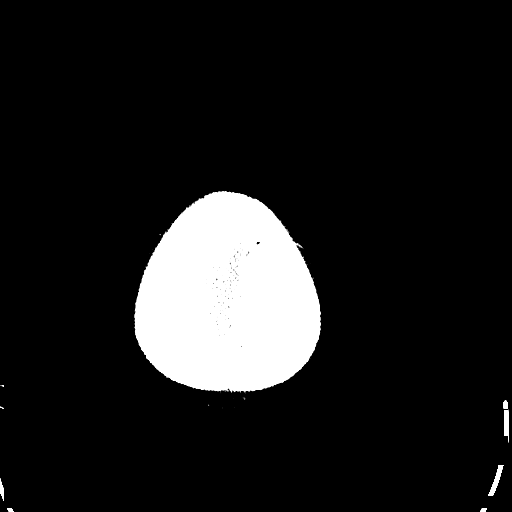

[16 of 30 positions shown; findings below may reference images not displayed]

FINDINGS: Chronic ischemic changes in the left anterior internal
capsule.  No mass effect, midline shift, or acute intracranial
hemorrhage.  Cranium is intact.  Mastoid air cells are clear.  A
right nasal fracture is suspected.  Air is present in the soft
tissues.
IMPRESSION: Nasal fracture and associated soft tissue injury are suspected.

Otherwise no acute intracranial pathology.

## 2010-05-07 IMAGING — CR DG CERVICAL SPINE COMPLETE 4+V
8 series · 8 of 8 positions shown · non-contrast
Comparison: None.

CLINICAL DATA: Fall

CERVICAL SPINE - COMPLETE 4+ VIEW

[w c-spine lat *]
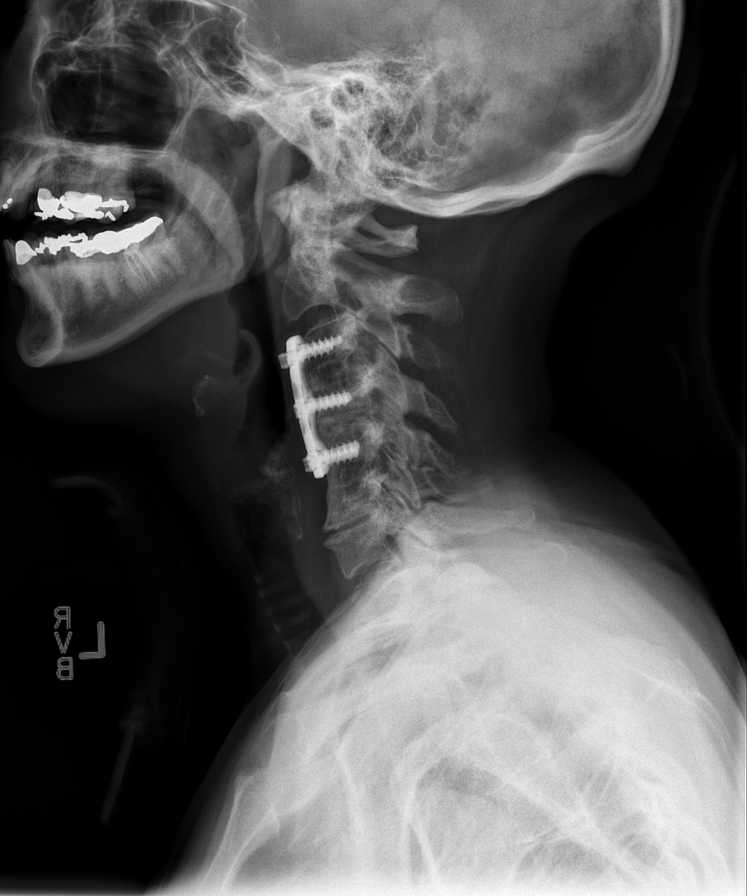

[w c-spine oblique * (1 of 3)]
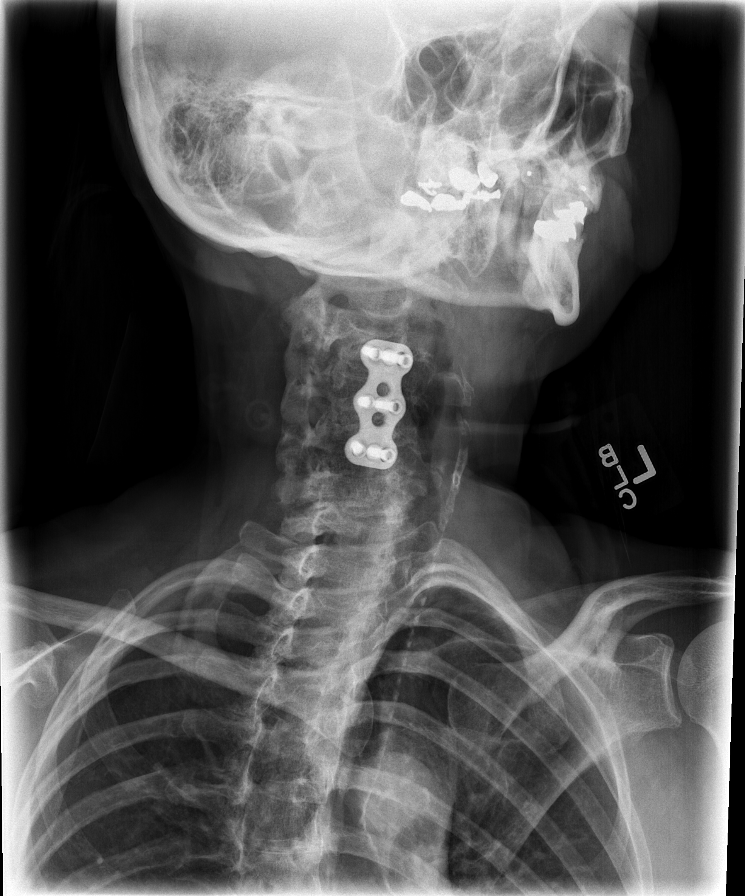

[w c-spine oblique * (2 of 3)]
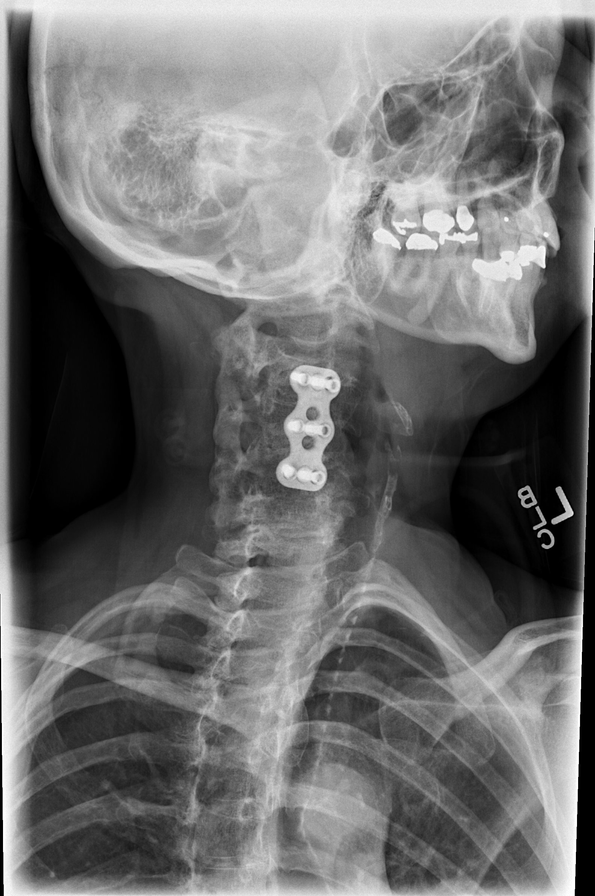

[w swimmers view * (1 of 2)]
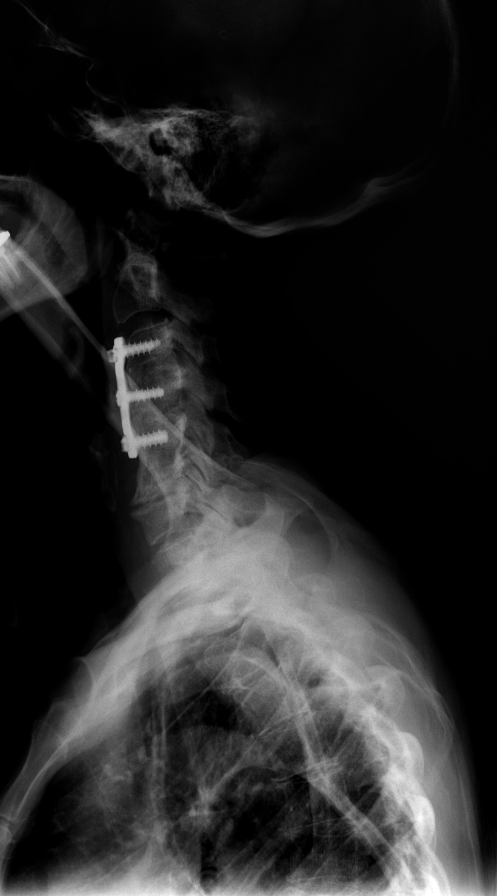

[w c-spine oblique * (3 of 3)]
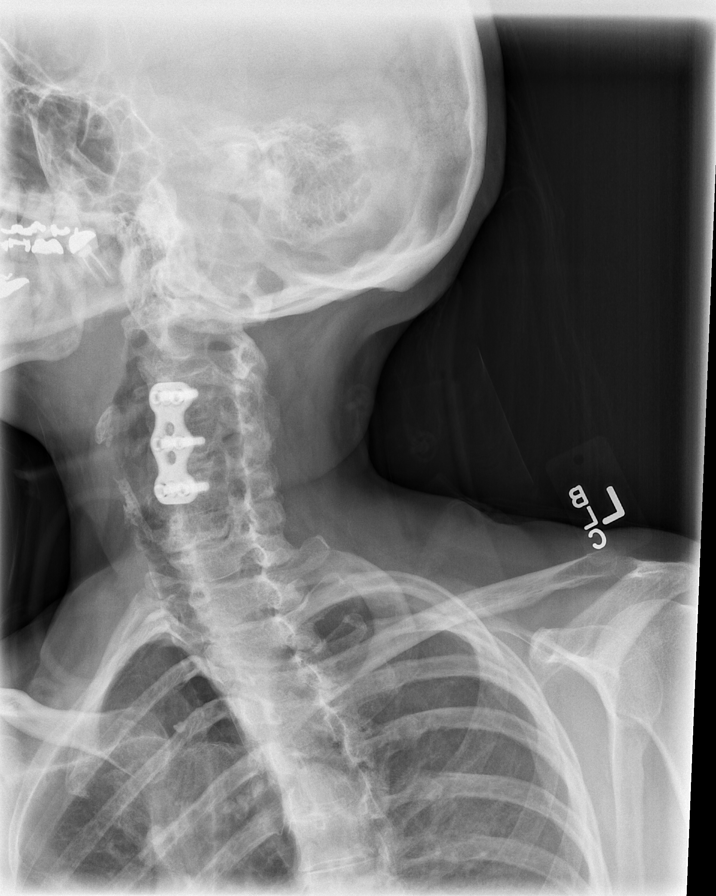

[w c-spine a.p.]
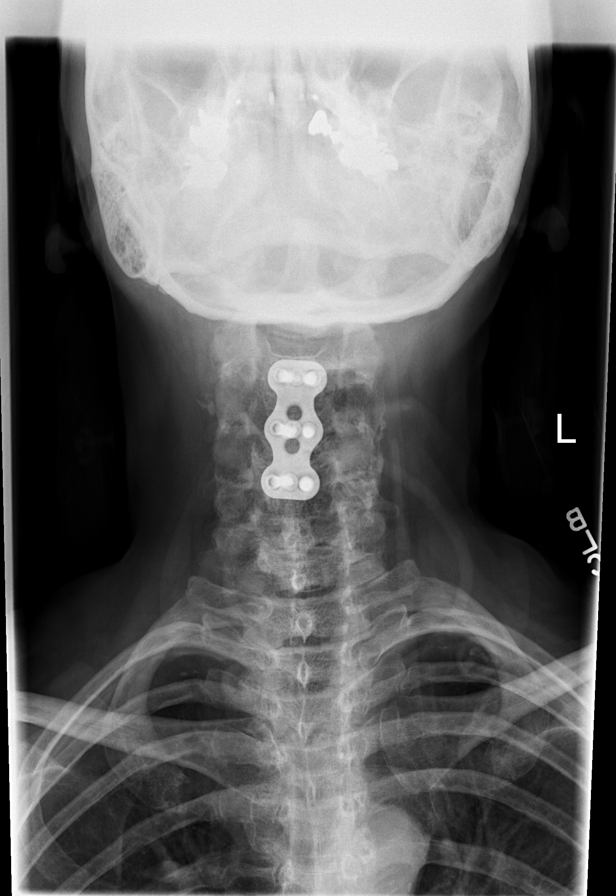

[w c-spine odontoid]
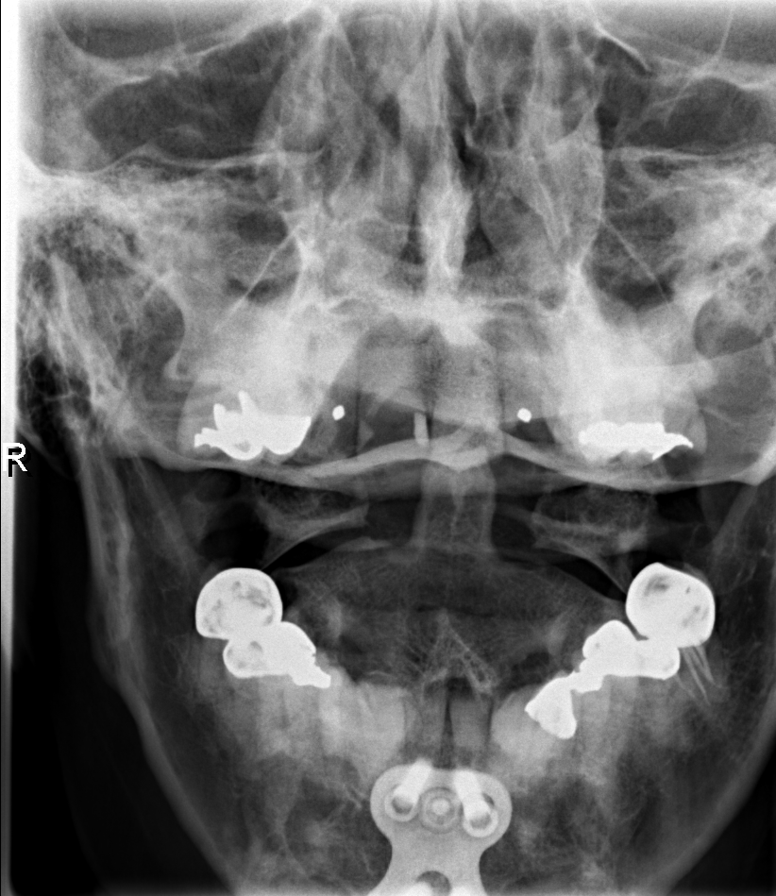

[w swimmers view * (2 of 2)]
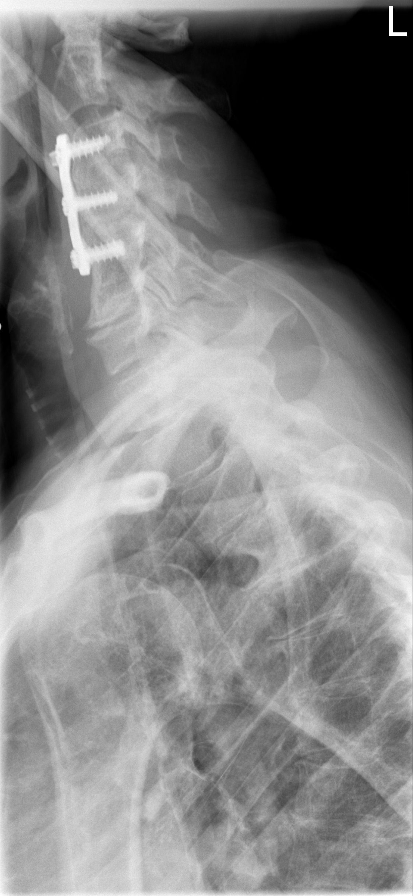

[8 of 8 positions shown; findings below may reference images not displayed]

FINDINGS: Anterior plate and screws are seen transfixing C3-C4 and
C5.  Solid fusion is apparent through the vertebral bodies from C3-
C6.  Severe narrowing of the C6-7 disc is noted.  Anterolisthesis
at C2-3 measures 2 mm.  No obvious acute fractures.  Oblique views
are limited.  Odontoid is intact.
IMPRESSION: Postoperative and degenerative changes.  No definite acute bony
pathology.

## 2011-04-03 NOTE — Letter (Signed)
June 17, 2007    Ms. Merilyn Baba   RE:  REKHA, HOBBINS  MRN:  [id number]  /  DOB:  05-07-1961   Dear Ms. Willeen Cass:   It is my pleasure to have treated you recently as a new patient in my  office.  I appreciate your confidence and the opportunity to participate  in your care.   Since I do have a busy inpatient endoscopy schedule and office schedule,  my office hours vary weekly.  I am, however, available for emergency  calls every day through my office.  If I cannot promptly meet an urgent  office appointment, another one of our gastroenterologists will be able  to assist you.   My well-trained staff are prepared to help you at all times.  For  emergencies after office hours, a physician from our gastroenterology  section is always available through my 24-hour answering service.   While you are under my care, I encourage discussion of your questions  and concerns, and I will be happy to return your calls as soon as I am  available.   Once again, I welcome you as a new patient and I look forward to a happy  and healthy relationship.    Sincerely,      Barbette Hair. Arlyce Dice, MD,FACG  Electronically Signed   RDK/MedQ  DD: 06/17/2007  DT: 06/18/2007  Job #: [id number]

## 2011-04-03 NOTE — Assessment & Plan Note (Signed)
[facility]                         GASTROENTEROLOGY OFFICE NOTE   NAME:BENNETTJimena, Wieczorek                     MRN:          [id number]  DATE:06/17/2007                            DOB:          04/15/1961    PROBLEM:  Abnormal liver tests.   Ms. Shadd is a 50 year old white female referred through the courtesy  of Dr. Perrin Maltese for evaluation.  Routine blood work demonstrated abnormal  liver tests.  This prompted an ultrasound that showed fatty infiltration  of the liver.  On May 30, 2007 alkaline phosphatase was 177, AST 128,  and ALT 53.  Bilirubin was normal.  Blood work was pertinent for a  hemoglobin of 13, and MCV of 101.  Serologies for hepatitis B and C were  negative.  Ms. Wanek admits to drinking 3 to 4 bourbons a day.  There  is no history of alcohol withdrawal symptoms, hepatitis, or IV drug use.   PAST MEDICAL HISTORY:  Pertinent for thyroid disease and hypertension,  and anxiety.  She is status post appendectomy.   FAMILY HISTORY:  Noncontributory.   MEDICATIONS:  Include Synthroid, amlodipine, and potassium.   SHE IS ALLERGIC TO CODEINE AND SULFA.   She smokes.  She is married and works as an Gaffer.   REVIEW OF SYSTEMS:  Positive for back pain.   EXAMINATION:  Pulse 72.  Blood pressure 118/76.  Weight 103.  She has a few vascular spiders on her chest and face.  HEENT: EOMI. PERRLA. Sclerae are anicteric.  Conjunctivae are pink.  NECK:  Supple without thyromegaly, adenopathy or carotid bruits.  CHEST:  Clear to auscultation and percussion without adventitious  sounds.  CARDIAC:  Regular rhythm; normal S1 S2.  There are no murmurs, gallops  or rubs.  ABDOMEN:  Bowel sounds are normoactive.  Abdomen is soft, non-tender and  non-distended.  There are no abdominal masses, tenderness, splenic  enlargement or hepatomegaly.  EXTREMITIES:  Full range of motion.  No cyanosis, clubbing or edema.  RECTAL:  Deferred.   IMPRESSION:  1. Alcoholic hepatitis.  2. 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   RECOMMENDATION:  I explained the implications of her liver disease with  the specific recommendation that she discontinue any alcohol use because  of her high risk for developing  alcoholic cirrhosis.  She appears to understand.  If she is willing to  return, I have recommended that she come back in 6 weeks' time, at which  point I would repeat her liver tests.     Barbette Hair. Arlyce Dice, MD,FACG  Electronically Signed    RDK/MedQ  DD: 06/17/2007  DT: 06/18/2007  Job #: [id number]   cc:   Jonita Albee, M.D.

## 2011-04-03 NOTE — Discharge Summary (Signed)
[name], [name]              ACCOUNT NO.:  [id number]   MEDICAL RECORD NO.:  [id number]          PATIENT TYPE:  INP   LOCATION:  1421                         FACILITY:  [facility]   PHYSICIAN:  Ramiro Harvest, MD    DATE OF BIRTH:  [birth year]   DATE OF ADMISSION:  12/22/2007  DATE OF DISCHARGE:  12/26/2007                               DISCHARGE SUMMARY   PRIMARY CARE PHYSICIAN:  Dr. Dorothe Pea of [facility].   ATTENDING PHYSICIAN:  Ramiro Harvest, MD   DISCHARGE DIAGNOSES:  1. Severe hypokalemia likely secondary to alcohol abuse.  2. Hypomagnesemia.  3. Urinary tract infection.  4. Polysubstance abuse - alcohol/cocaine.  5. Hepatic steatosis secondary to alcohol abuse.  6. Hypertension.  7. Elevated liver function tests.   DISCHARGE MEDICATIONS:  1. Amoxicillin 875 mg p.o. b.i.d. x4 days.  2. Multivitamin 1 tablet p.o. daily.  3. Folic acid 1 mg p.o. daily.  4. Thiamine 100 mg p.o. daily.   DISPOSITION AND FOLLOWUP:  The patient will be discharged home to follow  up with her primary care physician, Dr. Dorothe Pea, in 1 week and a  followup comprehensive metabolic profile needs to be checked to follow  up on the patient's potassium as well as liver function.  Magnesium  level needs to be checked to follow up on the patient's magnesium level.  The patient needs will be set up to enter a chemical dependency  intensive outpatient program on discharge.   CONSULTATIONS:  Psychiatric consult was obtained.  The patient was seen  in consultation with Dr. Jeanie Sewer on December 25, 2007.   PROCEDURES PERFORMED:  None.   BRIEF ADMISSION HISTORY AND PHYSICAL:  [name] is a 50-year-  old white female with past medical history significant for alcohol abuse  and dependence, alcoholic hepatitis and hepatic steatosis secondary to  alcohol abuse, has a history of hypertension and anemia who presented  with complaints of nausea and cramping.  The patient stated that she was  in her usual state of health until 3 days prior to admission when she  began to have generalized malaise and some cramping.  The cramping  continued to worsen and on the day of admission she stated that she had  severe cramps that started out in her lower extremities and up into her  upper extremities as well.  The patient admitted to nausea and vomiting  x2 on the day of admission.  The patient denied any diarrhea, abdominal  pain.  No shortness of breath.  No cough, no fever, no melena, no  hematochezia.  The patient did admit that she has continued to drink  alcohol 2-4 drinks of bourbon on most days and also stated that she has  not followed up with a gastroenterologist as specified.  She also  admitted to some dysuria for the past few days prior to admission.  In  the ED lab work revealed a potassium of 2 with a magnesium of 1.3.  Urinalysis was consistent with a probable urinary tract infection.  The  patient was admitted for further evaluation and management.  The patient  also admitted to noncompliance with her blood pressure medications.   PHYSICAL EXAMINATION:  VITAL SIGNS:  Temperature 98.7, blood pressure  109/65 was initially 151/98, pulse of 98, satting 98%.  GENERAL:  The patient is a middle-aged white female looking older than  her stated age in no respiratory distress.  HEENT:  Normocephalic, atraumatic.  Pupils equal, round and reactive to  light.  Extraocular movements were intact.  Sclerae anicteric.  Moist  mucous membranes.  No oral exudates.  NECK:  Neck was supple.  No lymphadenopathy.  LUNGS:  Lungs were clear to auscultation bilaterally.  No crackles, no  wheezes, no rhonchi.  CARDIOVASCULAR:  Regular rate and rhythm.  Normal S1-S2.  ABDOMEN:  Abdomen was soft, positive bowel sounds, nontender,  nondistended.  No hepatosplenomegaly.  EXTREMITIES:  No clubbing, cyanosis or edema.  The patient did have  tremors in the hands bilaterally.  SKIN:  She had  telangiectasias in the chest and neck area.  NEUROLOGICAL:  The patient was alert and oriented x3.  Cranial nerves II-  XII were grossly intact.  Nonfocal exam.   ADMISSION LABS:  Sodium 138, potassium 2.0, chloride 94, bicarb 27,  glucose 101, BUN 7, creatinine 0.57, calcium of 6.3.  CBC - white count  8.7, hemoglobin 12.3, hematocrit 34.7, platelet count 224, neutrophils  87%.  Magnesium was 1.3.  Urinalysis showed a leukocyte esterase small,  0.2 urobilinogen, urine white blood cells 7-10, many bacteria.   HOSPITAL COURSE:  1. Severe hypokalemia.  It was felt this was secondary to the      patient's chronic alcohol abuse.  A magnesium level was found to      also be low.  The patient's potassium was repleted per IV and      orally.  The patient's potassium was repleted aggressively.  She      was initially started on 40 mEq IV of potassium as well as 40 mEq      orally on day of admission.  On the next day she was given another      40 mEq IV of potassium over 4 hours as well.  The patient ended up      getting a total of 240 mEq of potassium IV before her potassium      stabilized to 3.7.  The patient's symptoms improved and by the day      of discharge the patient was no longer nauseous, was not having any      more cramping and was in stable and improved condition.  The      patient was also hydrated with IV fluids.  The patient will be      discharged home in stable and improved condition to follow up with      her PCP in 1 week.  2. Hypomagnesemia.  The patient's magnesium level was found to be 1.3      on admission.  The patient was initially given 5 g of magnesium      sulfate IV and the patient's magnesium improved and was repleted      and she had a magnesium level of 3.7.  The patient improved during      the hospitalization.  The patient will be discharged in stable and      improved condition to follow up with her PCP to get another      magnesium checked in a week.  3.  Polysubstance abuse, alcohol/cocaine abuse.  The patient was placed  on an Ativan detox protocol.  During the hospitalization the      patient was counseled to quit and psychiatric consult was also      obtained.  The patient was seen in consultation with Dr. Jeanie Sewer      on December 25, 2007.  It was felt that the patient did have      substance abuse dependence and it will be arranged for the patient      to enter a chemical dependence intensive outpatient program on      discharge.  The patient was maintained on multivitamins, folate and      thiamine throughout the hospitalization and will be discharged on      this indefinitely.  The patient finished Ativan detox/withdrawal      protocol during the hospitalization and the patient did not have      any episodes of withdrawal or DTs during the hospitalization.  The      patient will be discharged in stable and improved condition to      follow up with her PCP.  4. Urinary tract infection.  The patient was noted on admission to      have a urinary tract infection.  Urine cultures came back and      cultures had greater than 100,000 of Enterococcus species.  The      patient was initially placed empirically on Rocephin.  Cultures      came back and they were sensitive to ampicillin, levofloxacin,      Macrobid and vancomycin.  The patient was subsequently changed to      amoxicillin 875 mg p.o. b.i.d. to complete a 5 day course of this.      The patient was discharged home on amoxicillin 875 mg p.o. b.i.d.      for 4 more days to complete a 5 day course.  5. Hepatic steatosis stable secondary to alcohol abuse.  6. Elevated LFTs likely secondary to alcohol use.  These will need to      be followed up as an outpatient per her PCP.  On the day of      discharge the patient was in stable and improved condition.  The      patient will be discharged home in stable and improved condition.   DISCHARGE VITAL SIGNS:  Temperature 98.0, pulse  of 79, blood pressure  110/69, respiratory rate 18, satting 100% on room air.   DISCHARGE LABS:  Sodium 141, potassium 3.7, chloride 105, bicarb 28, BUN  2, creatinine 0.51, glucose of 83 and a calcium of 8.7.  It has been a  pleasure taking care of [name].      Ramiro Harvest, MD  Electronically Signed     DT/MEDQ  D:  12/26/2007  T:  12/27/2007  Job:  [id number]   cc:   Jethro Bastos, M.D.  Fax: [phone]

## 2011-04-03 NOTE — Consult Note (Signed)
NAMELEO, WEYANDT              ACCOUNT NO.:  [id number]   MEDICAL RECORD NO.:  [id number]          PATIENT TYPE:  INP   LOCATION:  1421                         FACILITY:  [facility]   PHYSICIAN:  Antonietta Breach, M.D.  DATE OF BIRTH:  14-Feb-1961   DATE OF CONSULTATION:  12/25/2007  DATE OF DISCHARGE:  12/26/2007                                 CONSULTATION   REQUESTING PHYSICIAN:  Dr. Ramiro Harvest.   REASON FOR CONSULTATION:  Anxiety and alcoholism.   HISTORY OF PRESENT ILLNESS:  Ms. Fratto is a 50 year old female  admitted to the [facility] on December 22, 2007 for  hypokalemia. She had been experiencing nausea and vomiting.   The patient had been drinking 11 beers per day for an unknown amount of  time. She acknowledges that she has been having alcohol withdrawal  symptoms including nausea and vomiting.  Her tremors have now resolved,  her sweats have now them resolved.  She is tolerating her Ativan taper  well.  Her period of alcohol withdrawal symptoms lasted approximately 4  days.   The patient does have a history of excessive worry and feeling on edge  as well as muscle tension and insomnia. She has been utilizing alcohol  as an antianxiety agent. She has constructive future goals and  interests, she does not have any thoughts of harming herself or others.  She has no hallucinations or delusions.   PAST PSYCHIATRIC HISTORY:  The patient has no history of suicide  attempts.  She denies any history of psychiatric care or psychotropic  medication treatment.  She denies any history of psychotherapy.   She does have a long-term history of excessive worry, feeling on edge,  and  muscle tension.   FAMILY PSYCHIATRIC HISTORY:  None known.   SOCIAL HISTORY:  The patient is a bookkeeper.  Initially she stated to  one of the evaluating physicians that she only drinks occasionally. She  does have a history of using cocaine.  She makes an effort to keep her  substance  abuse and dependent behavior hidden from her family.   PAST MEDICAL HISTORY:  History of hepatitis with hepatitis steatosis  secondary to alcohol, acute hypokalemia,  nausea and vomiting.   ALLERGIES:  CODEINE, SULFA.   MEDICATIONS:  The  MAR is reviewed.  The patient is on an Ativan taper.  She is also on B1 100 mg daily.   LABORATORY DATA:  Sodium 139, BUN 1, creatinine 0.41, WBC 8.7,  hemoglobin 12.3, platelet count 274.  SGOT 143, SGPT 33.   REVIEW OF SYSTEMS:  Constitutional, head, eyes, ears, nose, throat,  mouth, neurologic, psychiatric,  cardiovascular, respiratory,  gastrointestinal, genitourinary, skin, endocrine, metabolic,  musculoskeletal, neurologic, lymphatic all unremarkable. The patient is  now tolerating food and drink including Coca Cola.   PHYSICAL EXAMINATION:  VITAL SIGNS:  Temperature 98.9, pulse 83,  respiratory rate 20, blood pressure 136/70, O2 saturation on room air  100%.  Bilateral hand extension no tremor.  The patient has no sweats.   GENERAL APPEARANCE:  Ms. Pau is a middle-aged female lying in a  supine  position in her hospital bed with no abnormal involuntary  movements.   OTHER MENTAL STATUS EXAM:  Ms. Woodell is alert.  She has slight  decreased attention span, her concentration is mildly decreased.  Her  eye contact is good. She is oriented to all spheres.  Her memory is  intact to immediate, recent and remote.  Affect is slightly anxious.  Mood is slightly anxious.  Speech involves normal rate and prosody  without dysarthria.  Her speech volume is decreased.  Thought process is  logical, coherent, and goal-directed.  No looseness of associations.  Language expression and comprehension are intact, abstraction intact.  Thought content,  no thoughts of harming herself, no thoughts of harming  others,  no delusions, no hallucinations. Insight is partial,  judgment  is intact.   ASSESSMENT:  AXIS I:  1. 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  2. Alcohol dependence.  AXIS II:  Deferred.  AXIS III:  See general medical section.  AXIS IV:  General medical primary support group.  AXIS V:  55.   Ms. Schartz is not at risk to harm herself or others.  She agrees to  call emergency services immediately for any thoughts of harming herself,  thoughts of harming others or distress.   The undersigned provided ego supportive psychotherapy and education. The  indications, alternatives and adverse effects of the Ativan taper for  prevention of alcohol withdrawal were discussed along with the  psychosocial and 12-step therapies of substance dependence.   The treatments for anxiety were discussed.   The undersigned did recommend that the patient attend a chemical  dependency inpatient rehabilitation program however, the patient  declined and she is not committable.  She wants to attend a chemical  dependency intensive outpatient program.   RECOMMENDATIONS:  1. Would continue the Ativan taper monitoring for any breakthrough      withdrawal. The patient wants to finish the taper in the general      medical hospital.  2. Would continue a multivitamin, thiamine 100 mg and folic acid per      day indefinitely.  3. Will defer definitive psychotropic anti anxiety medication for now.   The patient may be a candidate for an SSRI such as Celexa. Clearly she  is a candidate for cognitive behavioral therapy combined with  progressive muscle relaxation and deep breathing training.      Antonietta Breach, M.D.  Electronically Signed     JW/MEDQ  D:  12/26/2007  T:  12/29/2007  Job:  [id number]

## 2011-04-03 NOTE — Letter (Signed)
June 17, 2007    Jonita Albee, M.D.  [facility]  [address]  [place], [place] [address]   RE:  FRIDA, WAHLSTROM  MRN:  [id number]  /  DOB:  11-09-1961   Dear Dr. Perrin Maltese:   Upon your kind referral, I had the pleasure of evaluating your patient  and I am pleased to offer my findings.  I saw Phyllistine Domingos in the  office today.  Enclosed is a copy of my progress note that details my  findings and recommendations.   Thank you for the opportunity to participate in your patient's care.    Sincerely,      Barbette Hair. Arlyce Dice, MD,FACG  Electronically Signed    RDK/MedQ  DD: 06/17/2007  DT: 06/18/2007  Job #: [id number]

## 2011-04-03 NOTE — H&P (Signed)
Holly Phelps, Holly Phelps              ACCOUNT NO.:  [id number]   MEDICAL RECORD NO.:  [id number]          PATIENT TYPE:  INP   LOCATION:  1421                         FACILITY:  [facility]   PHYSICIAN:  Kela Millin, M.D.DATE OF BIRTH:  Mar 19, 1961   DATE OF ADMISSION:  12/22/2007  DATE OF DISCHARGE:                              HISTORY & PHYSICAL   PRIMARY CARE PHYSICIAN:  Jethro Bastos, M.D.   CHIEF COMPLAINT:  Nausea and cramps.   HISTORY OF PRESENT ILLNESS:  The patient is a 50 year old white female  with past medical history significant for alcohol abuse/dependence,  alcoholic hepatitis and hepatic steatosis secondary to alcohol abuse,  hypertension and anemia, who presents with the above complaints.  She  states that she was in her usual state of health until 3 days ago, when  she began having generalized malaise and some cramping.  The cramping  continued to worsen and today she states she had severe cramps that  started out in her lower extremities and up to her upper extremities as  well.  She admits to nausea and vomiting x2 episodes today.  She denies  diarrhea, abdominal pain, shortness of breath, cough, fever, melena, and  no hematochezia.  She admits that she has continued to drink alcohol, 2-  4 drinks of bourbon on most days.  She states that she has not followed  up with her gastroenterologist.  She admits to dysuria for the past few  days.   In the ER her lab work revealed a potassium of 2 with a magnesium of  1.3, and her urinalysis was consistent with a UTI.  She is admitted for  further evaluation and management.  She also admits to noncompliance  with her blood pressure medications.   PAST MEDICAL HISTORY:  As above.   MEDICATIONS:  She states that she was supposed to be taking blood  pressure medications but she has not and does not remember any of the  names.   ALLERGIES:  CODEINE and SULFA.   SOCIAL HISTORY:  As above, positive for alcohol.  She  states her last  cocaine use was about a month ago, and she smokes about 3 cigarettes per  day.   FAMILY HISTORY:  Her dad had hypertension.   REVIEW OF SYSTEMS:  As per HPI.  Other review of systems negative.   PHYSICAL EXAM:  GENERAL:  The patient is a middle-aged white female.  She looks older than her stated age.  In no respiratory distress.  VITAL SIGNS:  Her temperature is 98.7, blood pressure is 109/65,  initially 151/98.  Her pulse is 98, initially 103.  Her O2 saturation is  98%.  HEENT:  PERRL, EOMI, sclerae anicteric.  Moist mucous membranes and no  oral exudates.  NECK:  Supple, no adenopathy and no thyromegaly.  LUNGS:  Clear to auscultation bilaterally.  No crackles or wheezes.  CARDIOVASCULAR:  Regular rate and rhythm.  Normal S1, S2.  ABDOMEN:  Soft, bowel sounds present, nontender, nondistended, no masses  palpable.  EXTREMITIES:  No cyanosis and no edema.  She has tremors in her  hands.  SKIN:  She has telangiectasias on her chest/neck area.  NEUROLOGIC:  She is alert and oriented x3.  Cranial nerves II-XII  grossly intact.  Nonfocal exam.   LABORATORY DATA:  Her sodium is 138 with a potassium of 2.0, chloride is  94, CO2 is 27, glucose is 101, BUN is 7, creatinine 0.57, calcium is  6.3.  White cell count is 8.7, hemoglobin 12.3, hematocrit of 34.7,  platelet count is 224, neutrophil count is 87%.  Magnesium is 1.3.  Urinalysis shows leukocyte esterase small, 0.2 urobilinogen, urine wbc's  7-10, many bacteria.   ASSESSMENT AND PLAN:  1. Hypokalemia, severe.  Likely secondary to alcohol abuse.  Magnesium      is also depleted.  We will replace.  2. Hypomagnesemia.  As above, replace magnesium.  Also secondary to      alcohol abuse.  3. Alcohol dependence.  The patient counseled to quit.  Will consult      psychiatry.  4. Pyuria, probable urinary tract infection.  Will obtain urine      cultures, place on empiric antibiotics as patient is symptomatic.  5.  History of hepatic steatosis, secondary to alcohol.  We will      recheck LFTs.  6. Hypertension.  Patient noncompliant with medications.  We will      place on clonidine.  7. History of anemia.  Stable.  Follow and recheck.  8. History of cocaine abuse.  Psychiatry consult as above.      Kela Millin, M.D.  Electronically Signed     ACV/MEDQ  D:  12/23/2007  T:  12/23/2007  Job:  [id number]   cc:   Jethro Bastos, M.D.  Fax: [phone]

## 2011-04-06 NOTE — Consult Note (Signed)
NAMECALYSTA, Holly Phelps NO.:  [id number]   MEDICAL RECORD NO.:  [id number]                   PATIENT TYPE:  REC   LOCATION:  TPC                                  FACILITY:  [facility]   PHYSICIAN:  Celene Kras, MD                     DATE OF BIRTH:  05-17-1961   DATE OF CONSULTATION:  10/21/2002  DATE OF DISCHARGE:                                   CONSULTATION   HISTORY OF PRESENT ILLNESS:  The [facility].  I evaluated and reviewed health and history form and 14-  point review of systems.   1. The patient comes in today and I reviewed the elements of the blood tests     that we had obtained.  Notably she has increased LFTs, although modest,     but the GGT is somewhat elevated.  I really do not have a good     explanation for this. She denies excessive acetaminophen and alcohol or     other hepatic toxins, and I did a careful review of these, discussing     them with her, and also discussing p.o. medications, OTC, and     prescription to avoid.  She is not to drink alcohol.  2. Prior to doing any procedures, I am going to go ahead and obtain a     bleeding time, hepatitis panel, CBC, PT, PTT, platelets, sed rate, T4 and     TSH.  Apparently she has not had these done in quite a while, and I am     going to ask her to see her primary care physician.  I really do not see     any reason to hold off doing the injection once we have clearly ruled out     the potential problem with bleeding, but her liver functions need to be     continually monitored and evaluated.  Cause and effect needs to be     identified, and we need a diagnosis here.   OBJECTIVE:  Diffuse paracervical myofacial discomfort, impaired flexion and  extension and lateral rotational pain.  Positive cervical facetal  compression test, this is unchanged.  She has pain with extension.  She has  no neurological findings, motor, sensory or reflexes.   IMPRESSION:  1. Cervicalgia.  2. Degenerative spinal disease cervical spine.  3. Cervical facet syndrome.   PLAN:  I will hold off doing an injection until we can reevaluate her.  Discharge instructions are given.  Extensive consultation as to overall  expectations.  Will see her in follow-up.  Celene Kras, MD    HH/MEDQ  D:  10/21/2002  T:  10/21/2002  Job:  [id number]

## 2011-04-06 NOTE — Consult Note (Signed)
   Holly Phelps, Holly Phelps NO.:  [id number]   MEDICAL RECORD NO.:  [id number]                   PATIENT TYPE:   LOCATION:                                       FACILITY:   PHYSICIAN:  Celene Kras, MD                     DATE OF BIRTH:  Nov 13, 1961   DATE OF CONSULTATION:  11/03/2002  DATE OF DISCHARGE:                                   CONSULTATION   [facility].  I evaluated her  via health and history form and 14-point review of systems.  At no time was  the physician left unattended.  This was a chaperoned visit.   1. As I reviewed the laboratory data and assessed the patient, it is     possible that there is alcohol on her breath today.  Although we planned     interventional procedure, I am going to hold off as her laboratory data     is concerning.  2. As I frankly discussed with her, we are going to go ahead and send her GI     and she agrees to go.  We actually helped make this appointment.  We     strongly counsel as the use of the potential hepatotoxic chemicals.  This     includes ETOH.  3. We would consider readdressing intervention at a later date, but I would     like to see her in follow-up and base this on need and overall directed     care approach with medical issues addressed.    OBJECTIVE:  Diffuse paracervical myofascial discomfort.  Empiric flexion,  extension, and lateral rotational pain.  I do not really feel a liver edge.  She has a soft, nontender abdominal examination.   IMPRESSION:  1. Elevated liver enzymes, possible ethanolism.  2. Degenerative spinal disease of cervical spine.  3. 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   PLAN:  Conservative management.  Refer to GI.  Follow-up in one to two  months.  Discharge instructions given.  Extensive consultation.  Counseling  in excess of 20 minutes face-to-face.                                               Celene Kras, MD    HH/MEDQ  D:  11/03/2002  T:  11/04/2002  Job:  [id number]

## 2011-04-06 NOTE — Op Note (Signed)
NAMEBO, ROGUE                        ACCOUNT NO.:  [id number]   MEDICAL RECORD NO.:  [id number]                   PATIENT TYPE:  OIB   LOCATION:  2899                                 FACILITY:  [facility]   PHYSICIAN:  Donalee Citrin, M.D.                     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   PROCEDURE:  Exploration of fusion at C5-6, removal of plate at E4-5,  anterior cervical decompression and fusion at C3-4 and C4-5 using 6 mm  patella wedges and a 40 mm lance blade with four, 13 mm rescue screws and  two regular 13 mm screws.   SURGEON:  Donalee Citrin, M.D.   ASSISTANT:  Kathaleen Maser. Pool, M.D.   ANESTHESIA:  General endotracheal anesthesia.   INDICATIONS FOR PROCEDURE:  The patient is a very pleasant 50 year old  female who has had long-standing chronic neck pain and underwent an anterior  cervical decompression and fusion at C5-6 a couple of years ago.  She did  very well from this.  However, over the last several months to weeks, she  has had progressively worsening neck pain with radiation into her shoulders.  The patient has been refractory to physical therapy and all forms of  conservative treatment.  Preoperative imaging showed kyphotic deformity with  spinal cord compression at C3-4 and C4-5 with spondylitic compression of the  spinal cord as well as the left C4 and C5 nerve roots.  Due to the patient's  failure of conservative treatment and preoperative imaging, the patient was  recommended to undergo anterior cervical decompression and fusion with  exploration and removal of plate at W0-9.  I subsequently went over the  risks and benefits of surgery with her and she understands and agrees to  proceed forward.   DESCRIPTION OF PROCEDURE:  The patient was brought to the OR and was induced  under  general anesthesia, positioned supine with the neck flexed in slight  extension and in five pounds of halter traction.  An intraoperative x-ray  confirmed localization at the C5 vertebral body. A midline incision was made  superior to the old incision in a curvilinear fashion just off the midline  to the anterior border of the sternocleidomastoid.  The superficial layer of  the platysma was dissected out and divided longitudinally.  The avascular  plane between the sternocleidomastoid and the strap muscles was developed to  the prevertebral fascia.  The prevertebral fascia was dissected away with  the Kitners.  Intraoperative x-ray confirmed localization of the C4-5 disk  space and then the plate was identified. This was dissected free.  The C5-6  level was noted to be strongly fused, so the plate was removed and then the  remainder of the  longus coli was reflected laterally exposing the C3-4 and  C4-5 disk spaces. An annulotomy was made with a 15 blade scalpel after self  retaining retractors were placed.  Then using a BA upgoing curette and 2 and  3 mm Kerrison punches, the anterior margin of the disk space was removed.  There was noted to be large anterior osteophytes coming off the C4-5  interspace and C4 vertebral body. This were bitten off with a Leksell  rongeur.  Then using a combination of a high speed drill and the BA upgoing  curettes, the interspace was drilled down both at C4-5 and C3-4.  The  operating microscope was draped and brought onto the field and under the  microscope with decompression of C4-5.  Using 1 and 2 mm Kerrison punches,  the posterior annulus and posterior longitudinal ligament was removed. A  large posterior osteophytic complex coming off the C4 vertebral body and  uncinate process was removed decompressing the left C5 nerve root.  Then the  remainder of the interspace was decompressed on the right identifying the  proximal right C5 nerve root. Both neural  foramen were explored with an  angled dural hook and there was no further stenosis.  Gelfoam was placed in  the interspace and attention was taken to C3-4.  Again, the annulotomy was  extended and pituitary rongeurs were used to remove the vast majority of the  anterior margin of the annulus.  A high speed drill was used to drill down  to the posterior osteophytic complexes.  There was noted to be severe  osteophytic compression of the left C4 nerve root.  This was all under  bitten with the 1 and 2 mm Kerrison punches decompressing the left C4 neural  foramen. This procedure was continued on the right.  At the end of the  diskectomy, both C4 neural foramen were widely patent. The end plates were  scraped with a BA curette and a 6 mm patellar wedge was sized, selected and  fashioned both in depth and in width and inserted approximately 1 mm deep to  the anterior vertebral body line.  Then the procedure was repeated at C4-5  with the end plates being scraped. A 6 mm wedge was fashioned into the  interspace. After both grafts were in place, the intraoperative x-ray was  removed. A size 40 plate was used.  Two rescue screws were placed in the  previous screw holes at C5 and then the two screws were placed at C3.  The  right sided screw had excellent purchase and was flushed.  The left sided  screw initially stripped and a rescue screw was placed.  The rescue screw  had good purchase, but was not able to be advanced flush with the plate, so  it is sitting approximately one thread proud.  The locking mechanism was  able to capture that thread if not the entire screw head, but it was  anchored down and felt to be stable.  Then two screws were inserted in C4  and his helped bring the C4 vertebral body back in alignment.  All the other  five screws had excellent purchase. The locking screws were tightened down.  Then the wound was copiously irrigated and meticulous hemostasis was maintained.  The  platysma was reapproximated with 3-0 interrupted Vicryl and  the skin was closed with a running 4-0 subcuticular.  Benzoin and Steri-  Strips were applied.  The patient returned to the recovery room in stable  condition.  At the end of the case, needle counts and sponge counts were  correct.                                               Donalee Citrin, M.D.    GC/MEDQ  D:  06/28/2004  T:  06/28/2004  Job:  [id number]

## 2011-04-06 NOTE — Consult Note (Signed)
[name], [name]                          ACCOUNT NO.:  [id number]   MEDICAL RECORD NO.:  [id number]                   PATIENT TYPE:  REC   LOCATION:  TPC                                  FACILITY:  [facility]   PHYSICIAN:  Hans C. Stevphen Rochester, M.D.                DATE OF BIRTH:  [birth year]   DATE OF CONSULTATION:  10/13/2002  DATE OF DISCHARGE:                                   CONSULTATION   HISTORY OF PRESENT ILLNESS:  Holly Phelps comes to pain management today  as a kind referral from Dr. Wynetta Emery. She is a patient complaining of  paracervical discomfort, left shoulder pain, and left arm pain with  associated headaches, relating the pain is a 6/10 on a subjective scale with  dull aching and throbbing. She has had this a number of years but has  escalated over the past few months. She has been recently recovering from  surgery with a cervical ACDF plate, with good fixation and followup. She  relates no specific advancing neurological features and is complaining of  leftward suprascapular myofascial discomfort interfering with rotation and  extension of her cervical spine. She does feel a grinding. She has some  modest left arm pain, described as more of a myofascial facetal referred  pain, C4-5, 5-6, and 6-7. She has improved with ice and medications. There  is no temporal relationship to the day. It is constant, it is also sharp.  She is made worse by working. Tests include x-rays and MRI, which I  reviewed. She has degenerative components in the cervical spine with a  facetal overlay.   MEDICATIONS:  Her medications include Allegra, Naproxen, and diazepam.   ALLERGIES:  Allergic to codeine, which upsets her stomach, sulfa drugs, and  some foods.   States no wish to harm self or others. Fourteen-point review of systems and  health and history form reviewed.   PAST SURGICAL HISTORY:  Cervical diskectomy 12/02 and appendectomy 4/96.   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 She has frequent headaches and numbness on the left side  as so described, but this has been followed and no recent changes.   FAMILY HISTORY:  Remarkable for CVA and hypertension.   SOCIAL HISTORY:  She is a bookkeeper by trade, continues to work. She smokes  five cigarettes a day and is attempting to quit. Occasional alcohol with  caution. She is married.   As I reviewed the records from Dr. Wynetta Emery, we note that she has had previous  elevated LFTs without primary care followup, and cautioned. She is  instructed to maintain contact with primary care.   There is question of ETOH use. She denies at this time.   PHYSICAL EXAMINATION:  GENERAL:  Reveals a pleasant female sitting  comfortably in bed. Gait, affect, appearance is normal, oriented x3.  HEENT:  Unremarkable.  CHEST:  Clear to auscultation and percussion.  HEART:  She has a regular, rate, and rhythm without rub, murmur, or gallop.  She has diffuse paracervical myofascial discomfort.  Flexion, extension,  lateral rotational pain, and positive cervical facetal compression test,  left greater than right. She has no other neurological deficit in motor,  sensory, or reflexes.   Provocative movement includes extension and lateral rotation produces pain.   IMPRESSION:  Cervicalgia, degenerative spinal disease of cervical spine, and  cervical facet syndrome.   PLAN:  1. Cervical facet medial branch block, positive provocative block raises     consideration of radiofrequency and neuroablation.  2. Best outcome of position with cigarette cessation as I relayed to her.  3. Do not believe further imaging or diagnostics are warranted with the     exception of following up with the LFTs. We will also obtain a GGT. She     will follow up with primary care.  4. No evidence of covert coagulopathy and do not believe we need to be     concerned in this regard.  5. We will maintain  nonnarcotic medication alternatives and discuss this     with her. Followup for block is discussed in detail with her. She is to     continue normal work related activities, consider possible stimulator.     Instructed to maintain contact with Dr. Jackelyn Poling C. Stevphen Rochester, M.D.    [facility]  D:  10/13/2002  T:  10/13/2002  Job:  [id number]   cc:   Donalee Citrin, M.D.  [address]  [place]  [place] [address]  Fax: [phone]

## 2011-04-06 NOTE — Op Note (Signed)
[place]  Patient:    Holly Phelps, Holly Phelps Visit Number: [id number] MRN: [id number]          Service Type: DSU Location: 3000 [id number] Attending Physician:  Mariam Dollar Dictated by:   Garlon Hatchet., M.D. Proc. Date: 10/21/01 Admit Date:  10/21/2001                             Operative Report  PREOPERATIVE DIAGNOSIS:  C6 radiculopathy, left greater than right.  PROCEDURE:  Anterior cervical diskectomy and fusion at C5-6 due to severe cervical spondylosis and compressible C6 nerve root split using a 7 mm patellar wedge and a 23 mm Atlantis plate, and four 13 mm variable angle screws.  SURGEON:  Garlon Hatchet., M.D.  ASSISTANT:  ______.  ANESTHESIA:  General.  INDICATIONS FOR PROCEDURE:  The patient is a very pleasant 50 year old female who has had long-standing neck and left arm pain that radiates from the shoulder down into the first two fingers of her left hand.  The patient failed anti-inflammatories and conservative treatment, and films showed severe spondylosis with ______ changes, bilateral compressionable C6 nerve roots and neuroformina with spinal disease in disk.  The patient was subsequently counseled of the risks and benefits of surgery, and decided to proceed forward.  DESCRIPTION OF PROCEDURE:  The patient was brought to the OR, was induced under general anesthesia.  Was positioned supine with neck in slight extension, 5 pounds of Halter traction.  The right side of her neck was prepped and draped in usual sterile fashion.  A curvilinear incision was made just off the midline to the sternocleidomastoid.  Superficially, the platysma was dissected down and divided longitudinally.  The ______ and sternocleidomastoid and the strap muscles were developed down prevertebral fascia.  The prevertebral fascia was dissected away with Kitners, and reflected laterally.  Intraoperative x-ray confirmed localization of needle in C6-7 disk  space.  Preoperative x-ray confirmed needle position over this as well.  Then entering the disk space slightly above this, then the one interspace above the ______ were reflected laterally.  A self-retaining retractor was placed.  Angulotomy was splayed in the disk space above the one that was marked with an 11 blade scalpel.  Pituitary rongeurs were used to remove the anterior margins of the annulus.  Then using high speed drill and upgoing curettes, the remainder of the disk space and end plates were evened off down to the posterior osteophyte.  There was a very large osteophyte coming off the C5 vertebral body.  This was underbitten with a one and two angle Kerrison punch, and then the posterior longitudinal ligament was identified and also removed in a piecemeal fashion.  The thecal sac was visualized.  Then attention was taken to decompress the left C6 nerve root. There was noted to be a severe amount of uncinate hypertrophy and spur compressing the left C6 nerve root.  This was radically decompressed, and the nerve root was widely decompressed, and the foramen explored with angle nerve hook, and noted to have no further osteophytic compression.  Then attention was taken to decompress the right C6 nerve root.  This was also decompressed radically, and an angled nerve hook was easily passed out the foramen.  End plates were then prepared for arthrodesis.  A 7 mm patellar wedge was inserted after antibiotics preoperatively had been placed, and then the 23 mm Atlantis plate was sized, selected, and four  variable angle 13 mm screws were inserted. The wound was copiously irrigated.  Good meticulous hemostasis was maintained. The platysma was closed with 3-0 interrupted Vicryl.  The subcutaneous tissue was closed with running 4-0 Vicryl, and postoperative x-ray confirmed good localization of plate, screws, and bone graft.  At the end of the case, all needle counts and sponge counts were  correct. Dictated by:   Garlon Hatchet., M.D. Attending Physician:  Mariam Dollar DD:  10/21/01 TD:  10/21/01 Job: [id number]

## 2011-06-13 ENCOUNTER — Other Ambulatory Visit: Payer: Self-pay | Admitting: Family Medicine

## 2011-06-13 DIAGNOSIS — R7989 Other specified abnormal findings of blood chemistry: Secondary | ICD-10-CM

## 2011-07-04 ENCOUNTER — Other Ambulatory Visit: Payer: Self-pay | Admitting: Family Medicine

## 2011-07-04 DIAGNOSIS — R779 Abnormality of plasma protein, unspecified: Secondary | ICD-10-CM

## 2011-07-04 DIAGNOSIS — E8809 Other disorders of plasma-protein metabolism, not elsewhere classified: Secondary | ICD-10-CM

## 2011-07-04 DIAGNOSIS — R7989 Other specified abnormal findings of blood chemistry: Secondary | ICD-10-CM

## 2011-07-04 DIAGNOSIS — D649 Anemia, unspecified: Secondary | ICD-10-CM

## 2011-08-10 LAB — COMPREHENSIVE METABOLIC PANEL
ALT: 32
ALT: 33
AST: 109 — ABNORMAL HIGH
AST: 143 — ABNORMAL HIGH
Albumin: 2.8 — ABNORMAL LOW
Albumin: 3 — ABNORMAL LOW
Alkaline Phosphatase: 170 — ABNORMAL HIGH
Alkaline Phosphatase: 179 — ABNORMAL HIGH
BUN: 1 — ABNORMAL LOW
BUN: 1 — ABNORMAL LOW
CO2: 26
CO2: 29
Calcium: 7.2 — ABNORMAL LOW
Calcium: 7.6 — ABNORMAL LOW
Chloride: 105
Chloride: 97
Creatinine, Ser: 0.41
Creatinine, Ser: 0.47
GFR calc Af Amer: >60
GFR calc Af Amer: >60
GFR calc non Af Amer: >60
GFR calc non Af Amer: >60
Glucose, Bld: 111 — ABNORMAL HIGH
Glucose, Bld: 97
Potassium: 2.3 — CL
Potassium: 2.7 — CL
Sodium: 139
Sodium: 139
Total Bilirubin: 1.2
Total Bilirubin: 2.1 — ABNORMAL HIGH
Total Protein: 5.4 — ABNORMAL LOW
Total Protein: 5.8 — ABNORMAL LOW

## 2011-08-10 LAB — BASIC METABOLIC PANEL
BUN: 1 — ABNORMAL LOW
BUN: 2 — ABNORMAL LOW
BUN: 7
CO2: 26
CO2: 27
CO2: 28
Calcium: 8 — ABNORMAL LOW
Calcium: 8.3 — ABNORMAL LOW
Calcium: 8.7
Chloride: 104
Chloride: 105
Chloride: 94 — ABNORMAL LOW
Creatinine, Ser: 0.45
Creatinine, Ser: 0.51
Creatinine, Ser: 0.57
GFR calc Af Amer: >60
GFR calc Af Amer: >60
GFR calc Af Amer: >60
GFR calc non Af Amer: >60
GFR calc non Af Amer: >60
GFR calc non Af Amer: >60
Glucose, Bld: 101 — ABNORMAL HIGH
Glucose, Bld: 83
Glucose, Bld: 87
Potassium: 2 — CL
Potassium: 3.4 — ABNORMAL LOW
Potassium: 3.7
Sodium: 137
Sodium: 138
Sodium: 141

## 2011-08-10 LAB — CBC
HCT: 34.7 — ABNORMAL LOW
Hemoglobin: 12.3
MCHC: 35.4
MCV: 104.3 — ABNORMAL HIGH
Platelets: 274
RBC: 3.33 — ABNORMAL LOW
RDW: 17.3 — ABNORMAL HIGH
WBC: 8.7

## 2011-08-10 LAB — DIFFERENTIAL
Basophils Absolute: 0
Basophils Relative: 0
Eosinophils Absolute: 0
Eosinophils Relative: 0
Lymphocytes Relative: 6 — ABNORMAL LOW
Lymphs Abs: 0.5 — ABNORMAL LOW
Monocytes Absolute: 0.6
Monocytes Relative: 7
Neutro Abs: 7.6
Neutrophils Relative %: 87 — ABNORMAL HIGH

## 2011-08-10 LAB — URINALYSIS, ROUTINE W REFLEX MICROSCOPIC
Bilirubin Urine: NEGATIVE
Glucose, UA: NEGATIVE
Hgb urine dipstick: NEGATIVE
Ketones, ur: 15 — AB
Nitrite: NEGATIVE
Protein, ur: NEGATIVE
Specific Gravity, Urine: 1.009
Urobilinogen, UA: 0.2
pH: 7

## 2011-08-10 LAB — POTASSIUM: Potassium: 3.7

## 2011-08-10 LAB — URINE CULTURE: Colony Count: >=100000

## 2011-08-10 LAB — HEPATIC FUNCTION PANEL
ALT: 44 — ABNORMAL HIGH
AST: 173 — ABNORMAL HIGH
Albumin: 4
Alkaline Phosphatase: 221 — ABNORMAL HIGH
Bilirubin, Direct: 0.4 — ABNORMAL HIGH
Indirect Bilirubin: 1.2 — ABNORMAL HIGH
Total Bilirubin: 1.6 — ABNORMAL HIGH
Total Protein: 7.5

## 2011-08-10 LAB — MAGNESIUM
Magnesium: 1.3 — ABNORMAL LOW
Magnesium: 3.7 — ABNORMAL HIGH

## 2011-08-10 LAB — URINE MICROSCOPIC-ADD ON

## 2011-08-10 LAB — LIPASE, BLOOD: Lipase: 18

## 2011-10-18 ENCOUNTER — Other Ambulatory Visit: Payer: Self-pay | Admitting: Family Medicine

## 2011-10-18 ENCOUNTER — Other Ambulatory Visit (HOSPITAL_COMMUNITY): Payer: Self-pay

## 2011-10-18 ENCOUNTER — Ambulatory Visit
Admission: RE | Admit: 2011-10-18 | Discharge: 2011-10-18 | Disposition: A | Discharge status: Home / Self Care | Payer: No Typology Code available for payment source | Source: Ambulatory Visit | Attending: Family Medicine | Admitting: Family Medicine

## 2011-10-18 DIAGNOSIS — R1031 Right lower quadrant pain: Secondary | ICD-10-CM

## 2011-10-18 DIAGNOSIS — R7989 Other specified abnormal findings of blood chemistry: Secondary | ICD-10-CM

## 2011-10-18 IMAGING — CT CT ABD-PELV W/ CM
2 of 5 series · 17 of 46 positions shown, 19 images · IV contrast (READICAT/WATER & [ID] OMNI 300)
Comparison: CT abdomen pelvis of [DATE]

CLINICAL DATA: Elevated liver function tests, right lower quadrant
pain, hematuria

CT ABDOMEN AND PELVIS WITH CONTRAST
TECHNIQUE: Multidetector CT imaging of the abdomen and pelvis was
performed following the standard protocol during bolus
administration of intravenous contrast.
Contrast: 100mL OMNIPAQUE IOHEXOL 300 MG/ML IV SOLN

[Series 2: abd/pelvis with · axial · 0.66mm/px · z∈[-338,+17]mm · 14 of 79 slices shown, 16 images]
[im 4/79  soft-tissue]
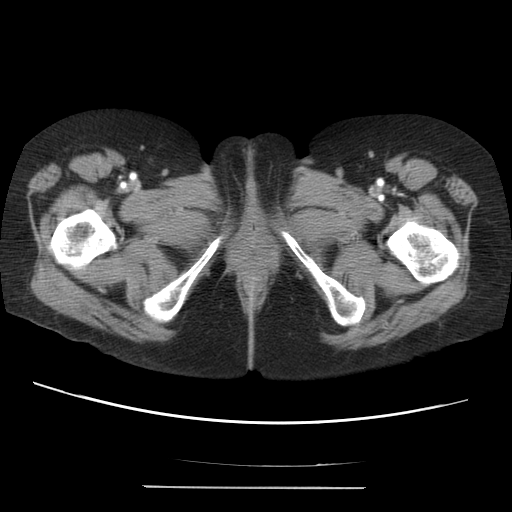
[im 4/79  bone]
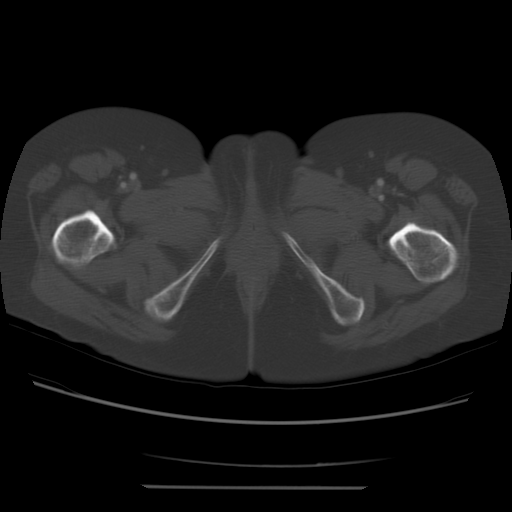
[im 12/79  soft-tissue]
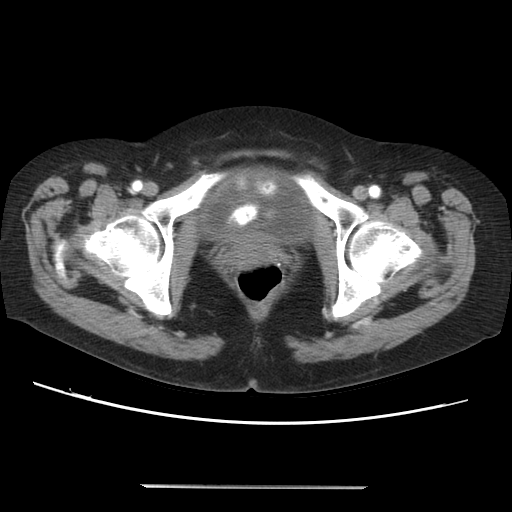
[im 16/79  soft-tissue]
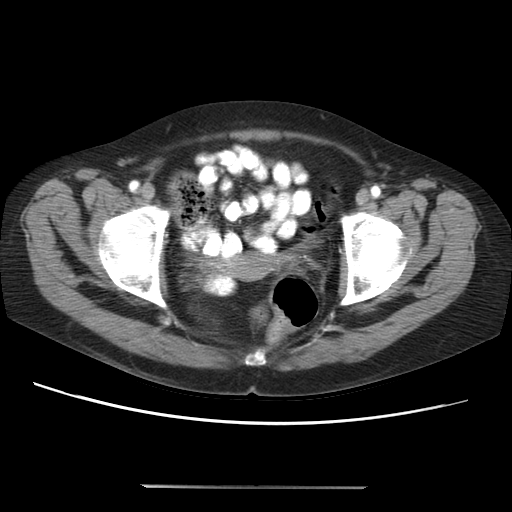
[im 20/79  soft-tissue]
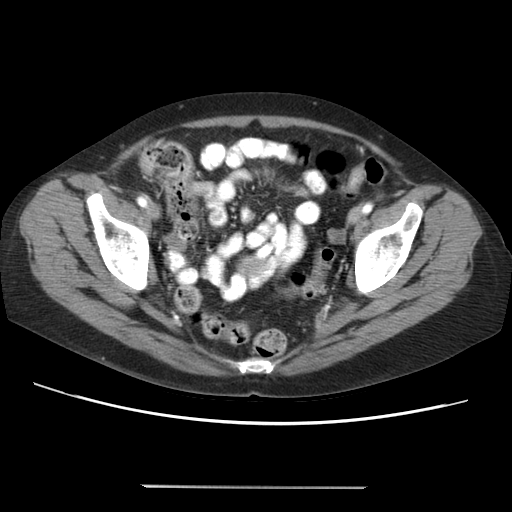
[im 28/79  soft-tissue]
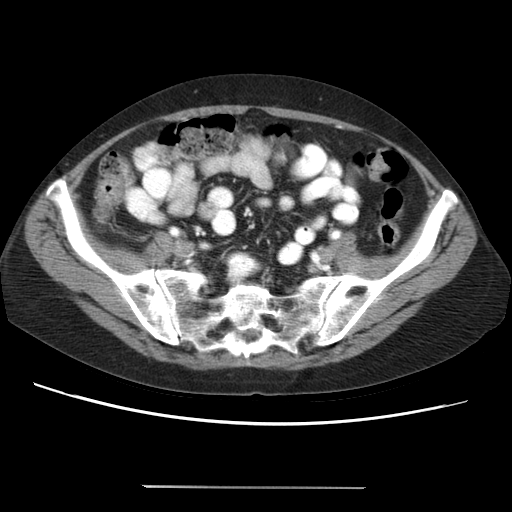
[im 32/79  soft-tissue]
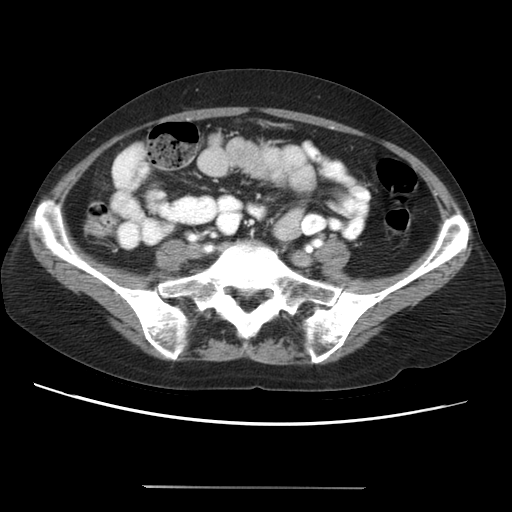
[im 36/79  soft-tissue]
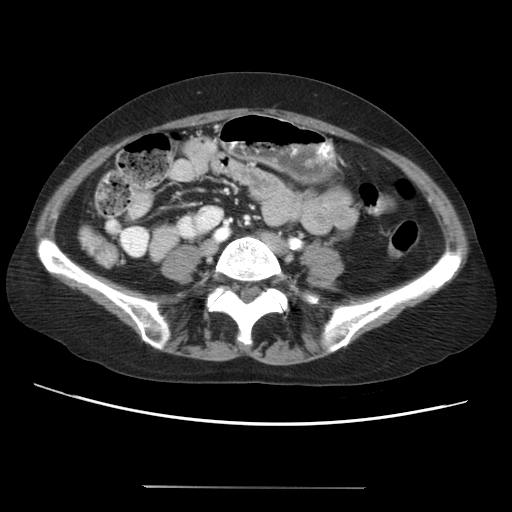
[im 43/79  soft-tissue]
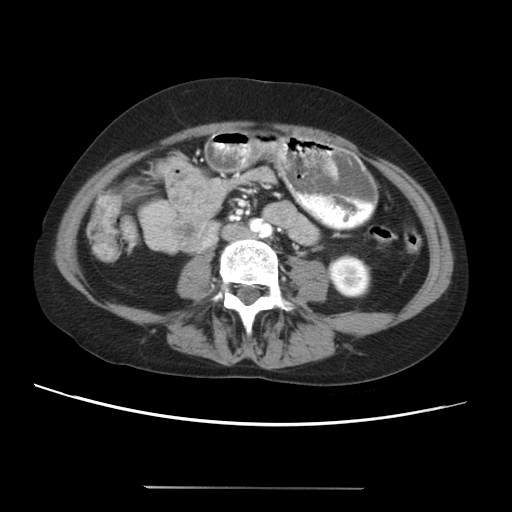
[im 47/79  soft-tissue]
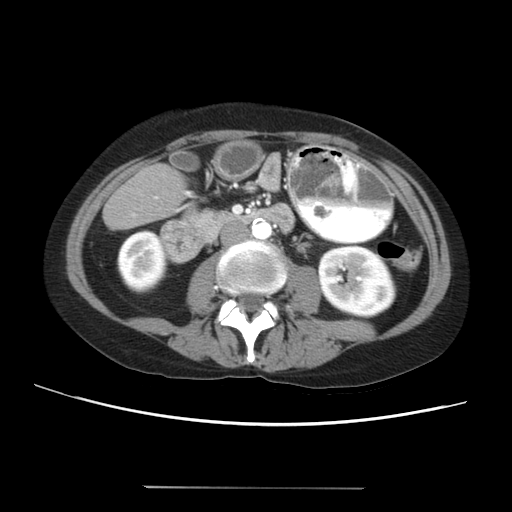
[im 47/79  bone]
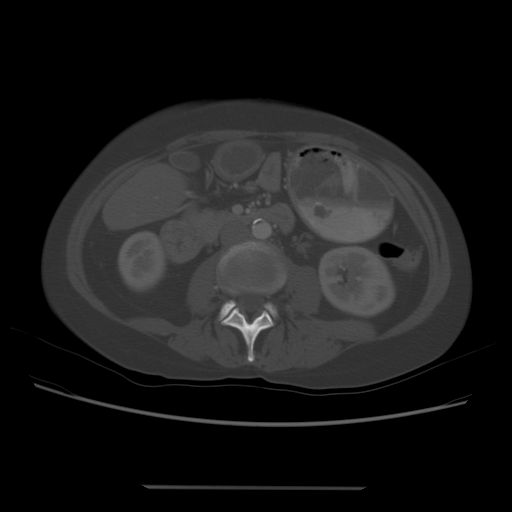
[im 51/79  soft-tissue]
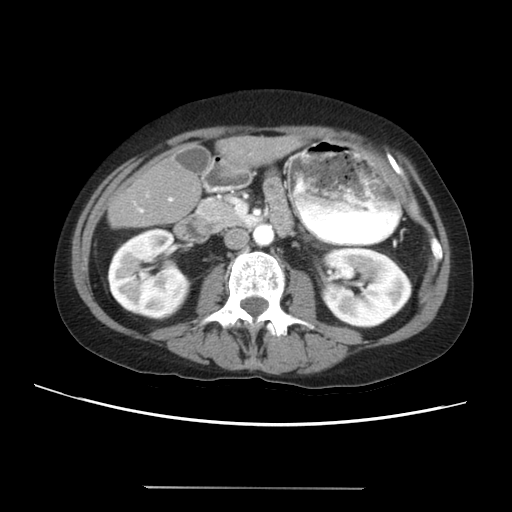
[im 59/79  soft-tissue]
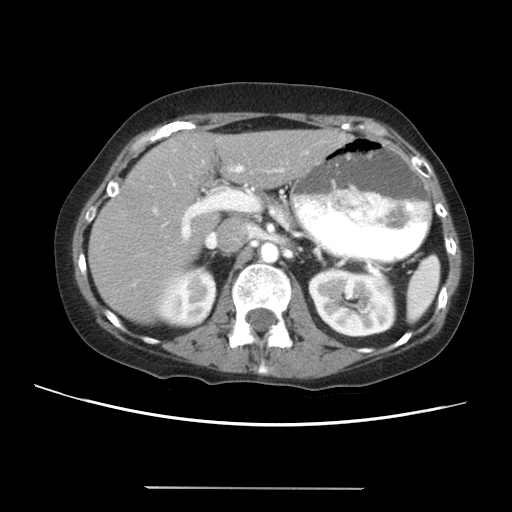
[im 63/79  soft-tissue]
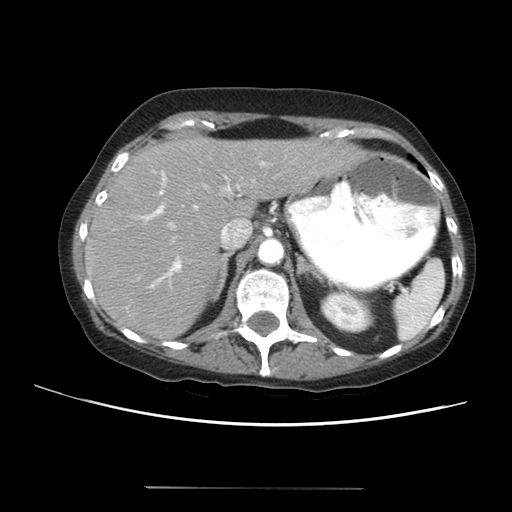
[im 67/79  soft-tissue]
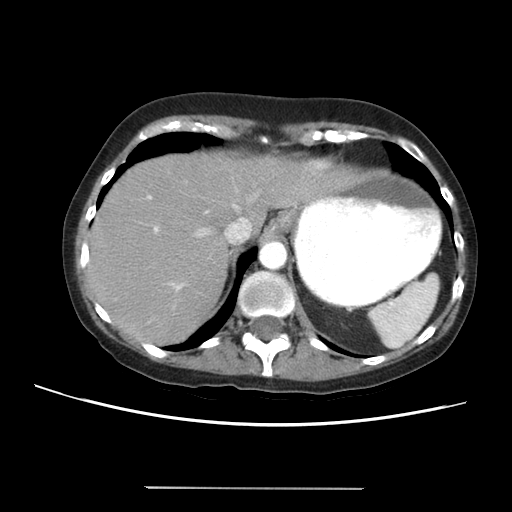
[im 75/79  soft-tissue]
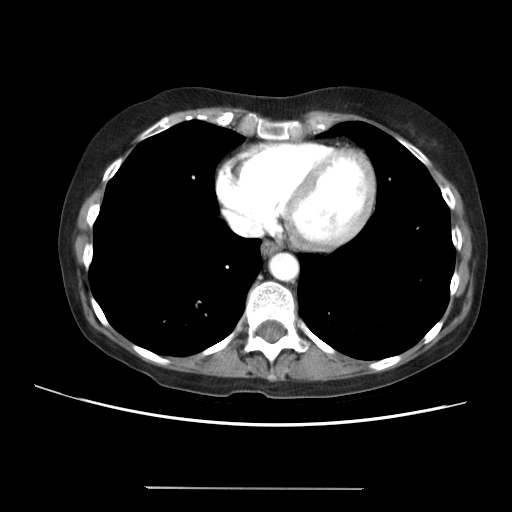

[Series 400: coronal · coronal · 0.84mm/px · 3 of 92 slices shown]
[im 31/92  soft-tissue]
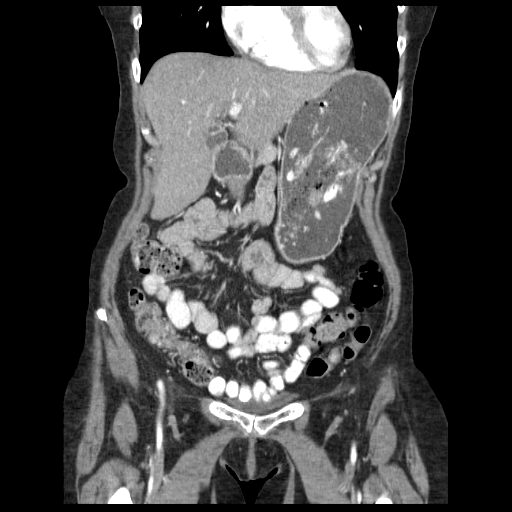
[im 41/92  soft-tissue]
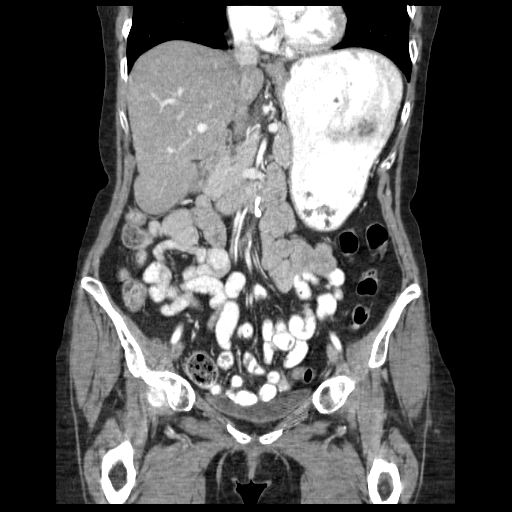
[im 51/92  soft-tissue]
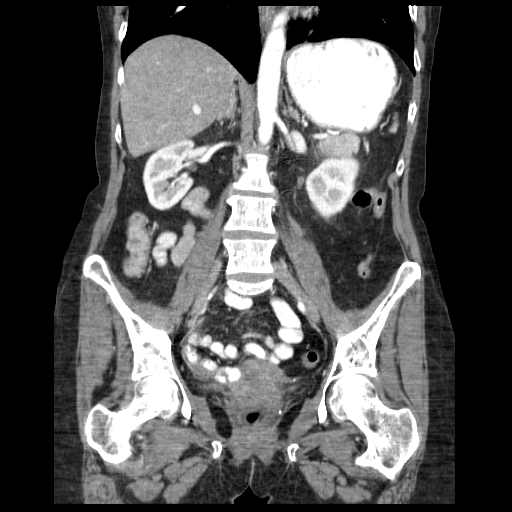

[17 of 46 positions shown; findings below may reference images not displayed]

FINDINGS: The lung bases are clear.  The liver is low attenuation
consistent with fatty infiltration.  No focal abnormality is seen.
No calcified gallstones are noted.  The pancreas is normal in size
and the pancreatic duct is not dilated.  The adrenal glands and
spleen are unremarkable.  The stomach is moderately fluid distended
with no abnormality noted.  The kidneys enhance with no evidence of
calculus or mass.  On delayed images no hydronephrosis is seen and
the pelvocaliceal systems are unremarkable.  Ureters are normal in
caliber.  Atheromatous change is noted within the abdominal aorta.

The uterus is normal in size.  No adnexal lesion is seen.  The
urinary bladder is not well distended.  No fluid is seen within the
pelvis.  No abnormality of the colon is seen with a moderate amount
of feces throughout the colon.  The terminal ileum is unremarkable.
By history the appendix has been resected.  No acute bony
abnormality is seen.
IMPRESSION: 1.  Negative CT of the abdomen and pelvis for mass or adenopathy.
2.  No renal calculi.  No hydronephrosis.
3.  Fatty infiltration of the liver.

## 2011-10-18 MED ORDER — IOHEXOL 300 MG/ML  SOLN
100.0000 mL | Freq: Once | INTRAMUSCULAR | Status: AC | PRN
  Administered 2011-10-18: 100 mL via INTRAVENOUS

## 2012-12-13 ENCOUNTER — Ambulatory Visit (INDEPENDENT_AMBULATORY_CARE_PROVIDER_SITE_OTHER): Payer: BC Managed Care – PPO | Admitting: Emergency Medicine

## 2012-12-13 VITALS — BP 154/78 | HR 92 | Temp 98.6°F | Resp 18 | Ht 62.0 in | Wt 104.6 lb

## 2012-12-13 DIAGNOSIS — R059 Cough, unspecified: Secondary | ICD-10-CM

## 2012-12-13 DIAGNOSIS — R05 Cough: Secondary | ICD-10-CM

## 2012-12-13 DIAGNOSIS — J111 Influenza due to unidentified influenza virus with other respiratory manifestations: Secondary | ICD-10-CM

## 2012-12-13 LAB — POCT INFLUENZA A/B
Influenza A, POC: NEGATIVE
Influenza B, POC: NEGATIVE

## 2012-12-13 MED ORDER — OSELTAMIVIR PHOSPHATE 75 MG PO CAPS: 75.0000 mg | ORAL_CAPSULE | Freq: Two times a day (BID) | ORAL | Status: DC

## 2012-12-13 NOTE — Patient Instructions (Addendum)

## 2012-12-13 NOTE — Progress Notes (Signed)
[facility] [address], [place] [address] [phone]  Date:  12/13/2012   Name:  Holly Phelps   DOB:  03/23/1961   MRN:  [id number]  PCP:  No primary provider on file.    Chief Complaint: URI   History of Present Illness:  Holly Phelps is a 52 y.o. very pleasant female patient who presents with the following:  Ill since Wednesday with myalgias, arthralgias, malaise and fatigue.  Has a cough that is non productive.  Mucoid nasal drainage and post nasal drainage and a sore throat. No improvement with OTC medications.  Did not receive a flu shot this year.  Smokes.  Patient Active Problem List  Diagnosis  . HYPOTHYROIDISM  . ANXIETY  . ALCOHOL ABUSE  . HYPERTENSION  . LIVER FUNCTION TESTS, ABNORMAL, HX OF    Past Medical History  Diagnosis Date  . Allergy     Past Surgical History  Procedure Date  . Appendectomy   . Cervicall fusion     History  Substance Use Topics  . Smoking status: Current Every Day Smoker  . Smokeless tobacco: Not on file     Comment: 3 cigarettes per day  . Alcohol Use: Yes     Comment: occasional    No family history on file.  Allergies  Allergen Reactions  . Codeine Nausea And Vomiting  . Sulfa Antibiotics     Pt does not recall what the reaction was,been too long    Medication list has been reviewed and updated.  No current outpatient prescriptions on file prior to visit.    Review of Systems:  As per HPI, otherwise negative.    Physical Examination: Filed Vitals:   12/13/12 1103  BP: 154/78  Pulse: 92  Temp: 98.6 F (37 C)  Resp: 18   Filed Vitals:   12/13/12 1103  Height: 5\' 2"  (1.575 m)  Weight: 104 lb 9.6 oz (47.446 kg)   Body mass index is 19.13 kg/(m^2). Ideal Body Weight: Weight in (lb) to have BMI = 25: 136.4   GEN: WDWN, NAD, Non-toxic, A & O x 3 HEENT: Atraumatic, Normocephalic. Neck supple. No masses, No LAD. Ears and Nose: No external deformity. CV: RRR, No  M/G/R. No JVD. No thrill. No extra heart sounds. PULM: CTA B, no wheezes, crackles, rhonchi. No retractions. No resp. distress. No accessory muscle use. ABD: S, NT, ND, +BS. No rebound. No HSM. EXTR: No c/c/e NEURO Normal gait.  PSYCH: Normally interactive. Conversant. Not depressed or anxious appearing.  Calm demeanor.    Assessment and Plan: Influenza tamiflu tussionex   Carmelina Dane, MD  Results for orders placed in visit on 12/13/12  POCT INFLUENZA A/B      Component Value Range   Influenza A, POC Negative     Influenza B, POC Negative

## 2012-12-26 ENCOUNTER — Ambulatory Visit (INDEPENDENT_AMBULATORY_CARE_PROVIDER_SITE_OTHER): Payer: BC Managed Care – PPO | Admitting: Emergency Medicine

## 2012-12-26 ENCOUNTER — Ambulatory Visit: Payer: BC Managed Care – PPO

## 2012-12-26 VITALS — BP 143/90 | HR 99 | Temp 97.9°F | Resp 18 | Wt 102.0 lb

## 2012-12-26 DIAGNOSIS — R05 Cough: Secondary | ICD-10-CM

## 2012-12-26 DIAGNOSIS — R059 Cough, unspecified: Secondary | ICD-10-CM

## 2012-12-26 DIAGNOSIS — K921 Melena: Secondary | ICD-10-CM

## 2012-12-26 DIAGNOSIS — D649 Anemia, unspecified: Secondary | ICD-10-CM

## 2012-12-26 LAB — POCT CBC
Granulocyte percent: 69.9 %G (ref 37–80)
HCT, POC: 33.4 % — AB (ref 37.7–47.9)
Hemoglobin: 10 g/dL — AB (ref 12.2–16.2)
Lymph, poc: 2.1 (ref 0.6–3.4)
MCH, POC: 25.2 pg — AB (ref 27–31.2)
MCHC: 29.9 g/dL — AB (ref 31.8–35.4)
MCV: 84.1 fL (ref 80–97)
MID (cbc): 0.6 (ref 0–0.9)
MPV: 7 fL (ref 0–99.8)
POC Granulocyte: 6.2 (ref 2–6.9)
POC LYMPH PERCENT: 23.8 %L (ref 10–50)
POC MID %: 6.3 %M (ref 0–12)
Platelet Count, POC: 467 10*3/uL — AB (ref 142–424)
RBC: 3.97 M/uL — AB (ref 4.04–5.48)
RDW, POC: 19.6 %
WBC: 8.8 10*3/uL (ref 4.6–10.2)

## 2012-12-26 LAB — IFOBT (OCCULT BLOOD): IFOBT: POSITIVE

## 2012-12-26 IMAGING — CR DG CHEST 2V
2 series · 2 of 2 positions shown · non-contrast
Comparison: Chest x-ray of [DATE]

CLINICAL DATA: Cough, shortness of breath

CHEST - 2 VIEW

[PA]
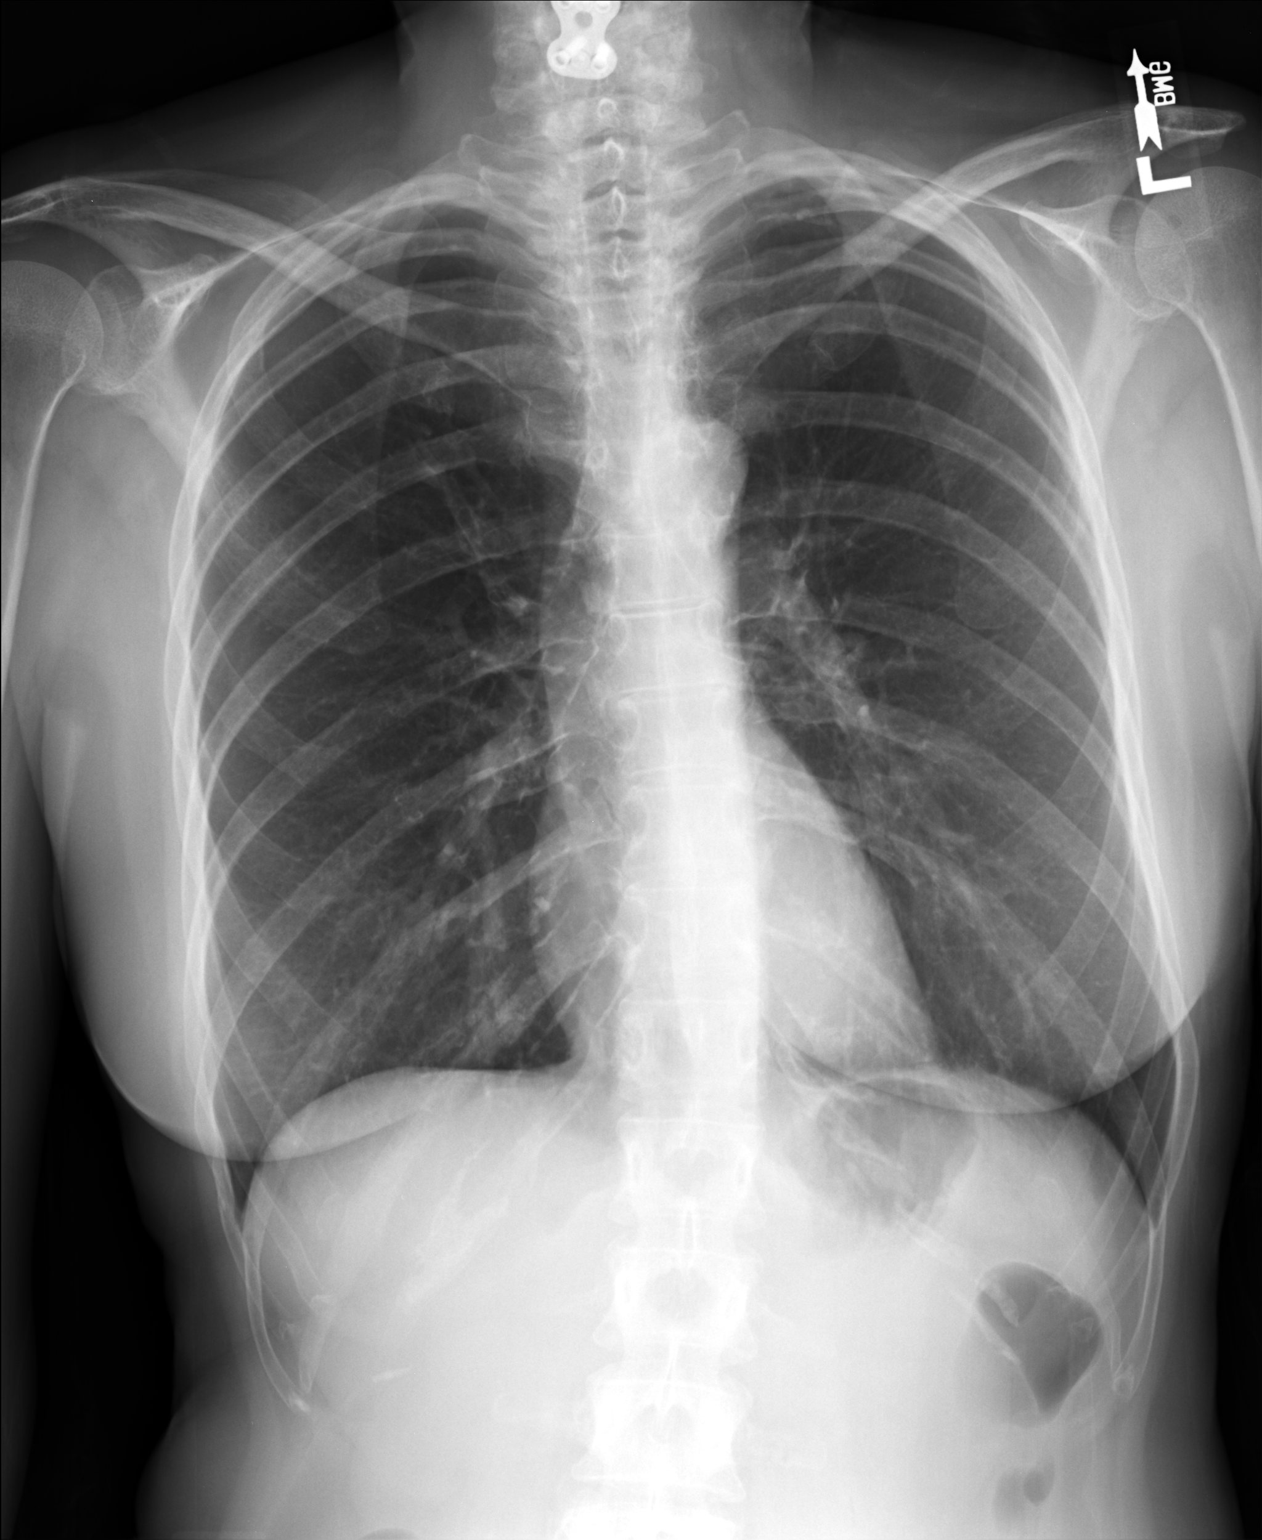

[lateral]
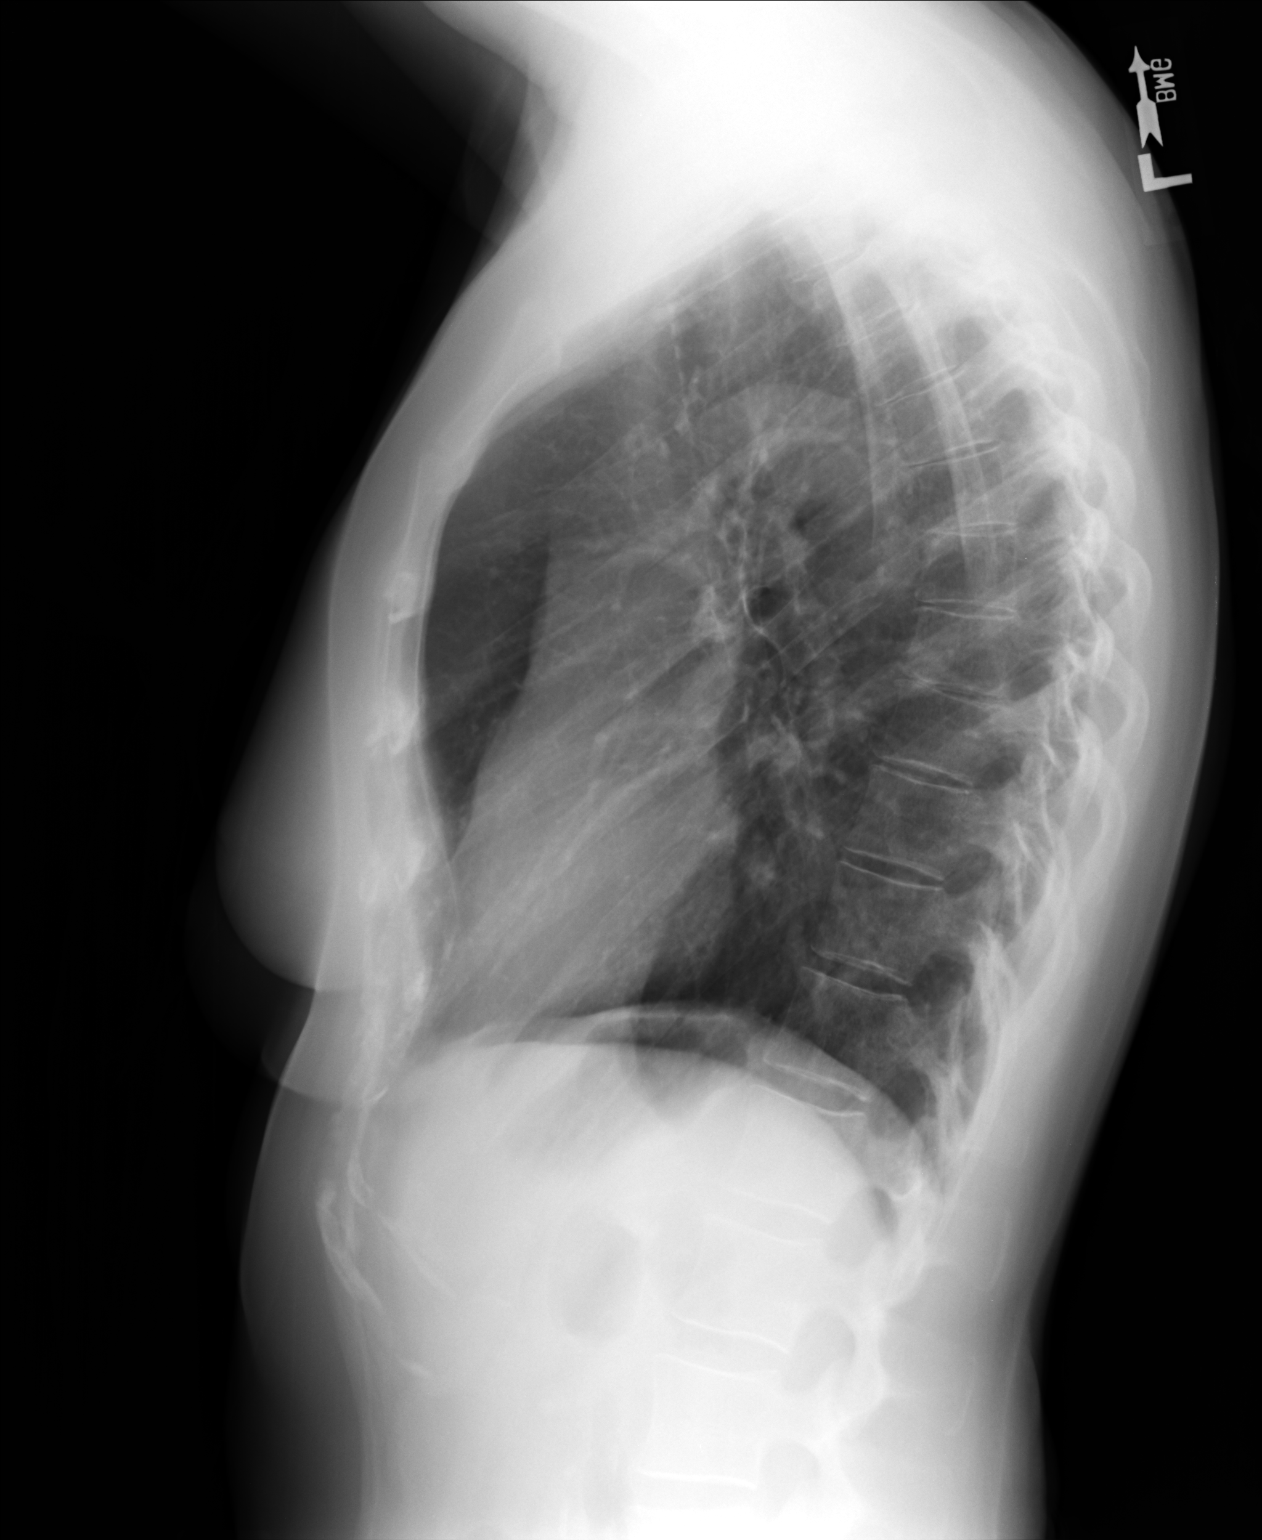

[2 of 2 positions shown; findings below may reference images not displayed]

FINDINGS: The lungs are clear but somewhat hyperaerated.
Mediastinal contours appear stable.  The heart is within normal
limits in size.  The lower anterior cervical spine fusion plate is
noted
IMPRESSION: No active lung disease.  Stable hyperaeration.

## 2012-12-26 MED ORDER — FERROUS SULFATE 325 (65 FE) MG PO TABS: 325.0000 mg | ORAL_TABLET | Freq: Every day | ORAL | Status: DC

## 2012-12-26 MED ORDER — OMEPRAZOLE 20 MG PO CPDR: 20.0000 mg | DELAYED_RELEASE_CAPSULE | Freq: Every day | ORAL | Status: DC

## 2012-12-26 MED ORDER — BENZONATATE 100 MG PO CAPS: 100.0000 mg | ORAL_CAPSULE | Freq: Three times a day (TID) | ORAL | Status: DC | PRN

## 2012-12-26 NOTE — Progress Notes (Signed)
  Subjective:    Patient ID: Holly Phelps, female    DOB: 1961/04/07, 52 y.o.   MRN: [id number]  HPI Pt presents today with a harsh cough that is non productive. Two weeks ago she was diagnosed with the flu and completed the medication Dr. Dareen Piano prescribed, Tamiflu. She reports no interest in eating and extreme fatigue. She ate a small meal yesterday and threw it up. She complains of a headache. Pulse Ox taken during exam 100%.    Review of Systems     Objective:   Physical Exam HEENT exam TMs are clear throat is normal. Neck is supple. Chest is clear to both auscultation and percussion. Cardiac exam has a regular rate no murmurs. Abdomen is soft there are no masses felt patient looks frail and her sclera didn't appear to be pale. On rectal exam I did not feel a mass however there was a bloody mucus on the end of the exam glove  [facility] reading (PRIMARY) by  Dr. Cleta Alberts there are COPD changes no pneumonia is seen .  Results for orders placed in visit on 12/26/12  POCT CBC      Component Value Range   WBC 8.8  4.6 - 10.2 K/uL   Lymph, poc 2.1  0.6 - 3.4   POC LYMPH PERCENT 23.8  10 - 50 %L   MID (cbc) 0.6  0 - 0.9   POC MID % 6.3  0 - 12 %M   POC Granulocyte 6.2  2 - 6.9   Granulocyte percent 69.9  37 - 80 %G   RBC 3.97 (*) 4.04 - 5.48 M/uL   Hemoglobin 10.0 (*) 12.2 - 16.2 g/dL   HCT, POC 40.9 (*) 81.1 - 47.9 %   MCV 84.1  80 - 97 fL   MCH, POC 25.2 (*) 27 - 31.2 pg   MCHC 29.9 (*) 31.8 - 35.4 g/dL   RDW, POC 91.4     Platelet Count, POC 467 (*) 142 - 424 K/uL   MPV 7.0  0 - 99.8 fL   Results for orders placed in visit on 12/26/12  POCT CBC      Component Value Range   WBC 8.8  4.6 - 10.2 K/uL   Lymph, poc 2.1  0.6 - 3.4   POC LYMPH PERCENT 23.8  10 - 50 %L   MID (cbc) 0.6  0 - 0.9   POC MID % 6.3  0 - 12 %M   POC Granulocyte 6.2  2 - 6.9   Granulocyte percent 69.9  37 - 80 %G   RBC 3.97 (*) 4.04 - 5.48 M/uL   Hemoglobin 10.0 (*) 12.2 - 16.2 g/dL   HCT, POC 78.2 (*)  95.6 - 47.9 %   MCV 84.1  80 - 97 fL   MCH, POC 25.2 (*) 27 - 31.2 pg   MCHC 29.9 (*) 31.8 - 35.4 g/dL   RDW, POC 21.3     Platelet Count, POC 467 (*) 142 - 424 K/uL   MPV 7.0  0 - 99.8 fL  IFOBT (OCCULT BLOOD)      Component Value Range   IFOBT Positive        Assessment & Plan:   use Tessalon Perles for cough. We started her on 9 one a day. She is on Prilosec 20 one a day I am somewhat worried about varices as a possibility however she did have red blood on the exam glove during the rectal exam

## 2012-12-27 LAB — COMPREHENSIVE METABOLIC PANEL
ALT: 35 U/L (ref 0–35)
AST: 86 U/L — ABNORMAL HIGH (ref 0–37)
Albumin: 5 g/dL (ref 3.5–5.2)
Alkaline Phosphatase: 158 U/L — ABNORMAL HIGH (ref 39–117)
BUN: 15 mg/dL (ref 6–23)
CO2: 25 mEq/L (ref 19–32)
Calcium: 10.1 mg/dL (ref 8.4–10.5)
Chloride: 93 mEq/L — ABNORMAL LOW (ref 96–112)
Creat: 0.63 mg/dL (ref 0.50–1.10)
Glucose, Bld: 81 mg/dL (ref 70–99)
Potassium: 3.7 mEq/L (ref 3.5–5.3)
Sodium: 137 mEq/L (ref 135–145)
Total Bilirubin: 0.8 mg/dL (ref 0.3–1.2)
Total Protein: 8.4 g/dL — ABNORMAL HIGH (ref 6.0–8.3)

## 2012-12-27 LAB — FERRITIN: Ferritin: 62 ng/mL (ref 10–291)

## 2012-12-27 LAB — IRON: Iron: 221 ug/dL — ABNORMAL HIGH (ref 42–145)

## 2012-12-27 LAB — IBC PANEL
%SAT: 32 % (ref 20–55)
TIBC: 681 ug/dL — ABNORMAL HIGH (ref 250–470)
UIBC: 460 ug/dL — ABNORMAL HIGH (ref 125–400)

## 2013-01-05 IMAGING — CR DG CHEST 2V
2 series · 2 of 2 positions shown · non-contrast
Comparison: [DATE]

CLINICAL DATA: Off and on pain under right breast, chest wall pain,
smoker

EXAM:
CHEST  2 VIEW

[PA]
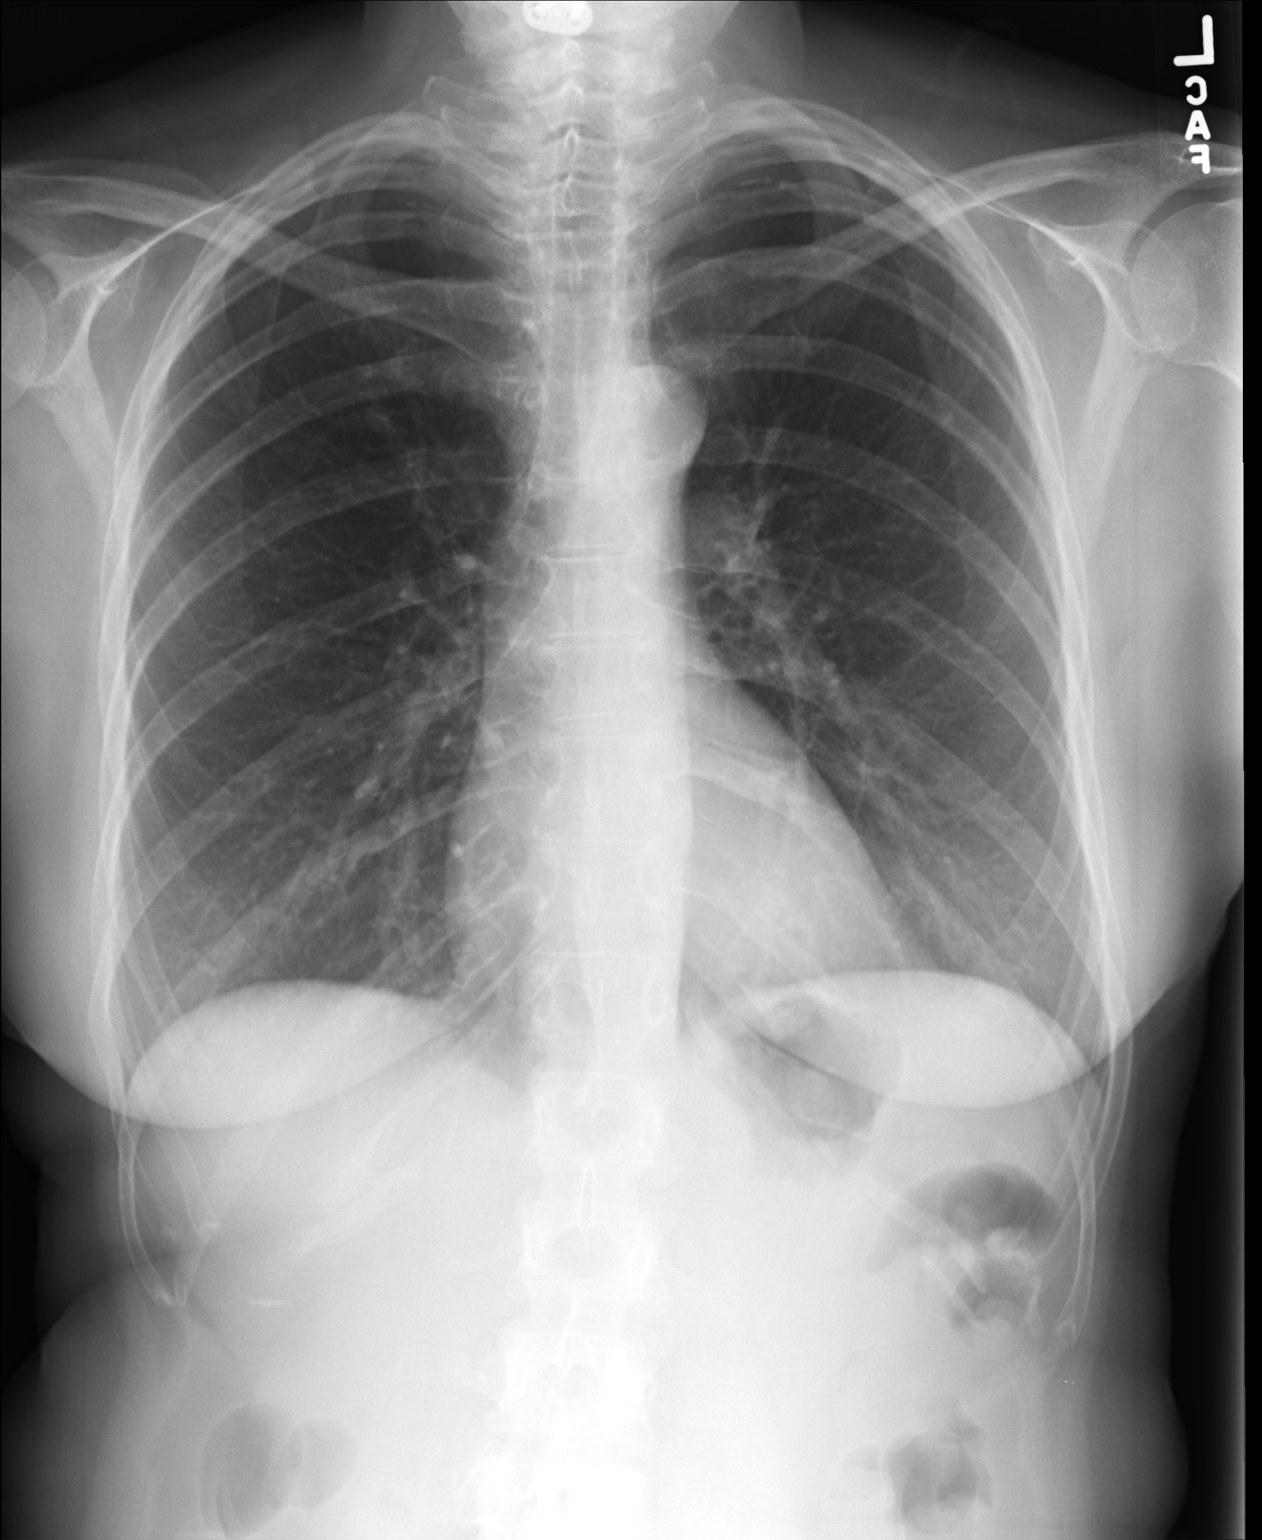

[lateral]
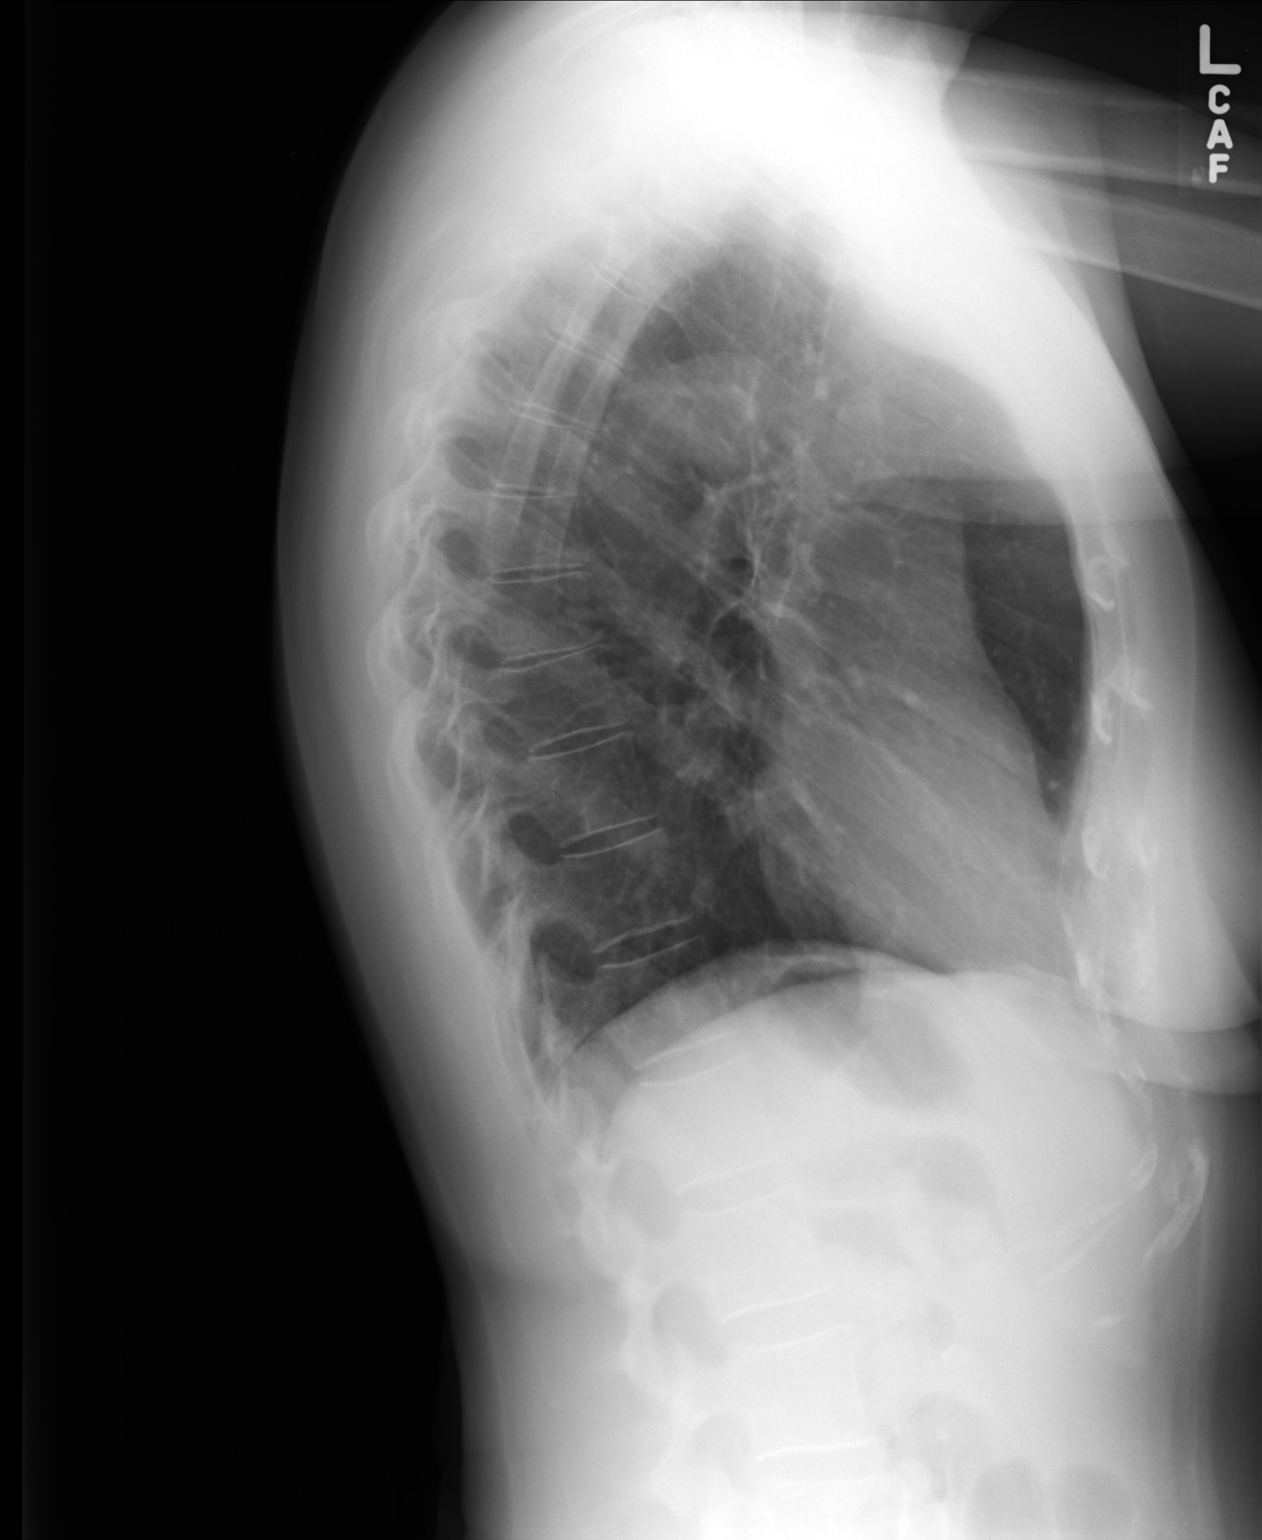

[2 of 2 positions shown; findings below may reference images not displayed]

FINDINGS: Normal heart size, mediastinal contours, and pulmonary vascularity.

Lungs hyperinflated but clear.

No pleural effusion or pneumothorax.

Question mild osseous demineralization.

No acute bony findings identified.

Question old healed fracturea of the lateral right 9th rib.
IMPRESSION: Question old fracture of the lateral right 9th rib.

Hyperinflated lungs.

## 2013-01-13 ENCOUNTER — Ambulatory Visit: Payer: BC Managed Care – PPO

## 2013-01-13 ENCOUNTER — Ambulatory Visit (INDEPENDENT_AMBULATORY_CARE_PROVIDER_SITE_OTHER): Payer: BC Managed Care – PPO | Admitting: Emergency Medicine

## 2013-01-13 VITALS — BP 144/82 | HR 96 | Temp 98.6°F | Resp 16 | Ht 62.0 in | Wt 106.0 lb

## 2013-01-13 DIAGNOSIS — S139XXA Sprain of joints and ligaments of unspecified parts of neck, initial encounter: Secondary | ICD-10-CM

## 2013-01-13 DIAGNOSIS — M542 Cervicalgia: Secondary | ICD-10-CM

## 2013-01-13 DIAGNOSIS — M25512 Pain in left shoulder: Secondary | ICD-10-CM

## 2013-01-13 IMAGING — CR DG CERVICAL SPINE COMPLETE 4+V
5 series · 5 of 5 positions shown · non-contrast
Comparison: [HOSPITAL] cervical spine radiograph
[DATE].

CLINICAL DATA: 51-year-old female with neck pain.

CERVICAL SPINE - COMPLETE 4+ VIEW

[lateral]
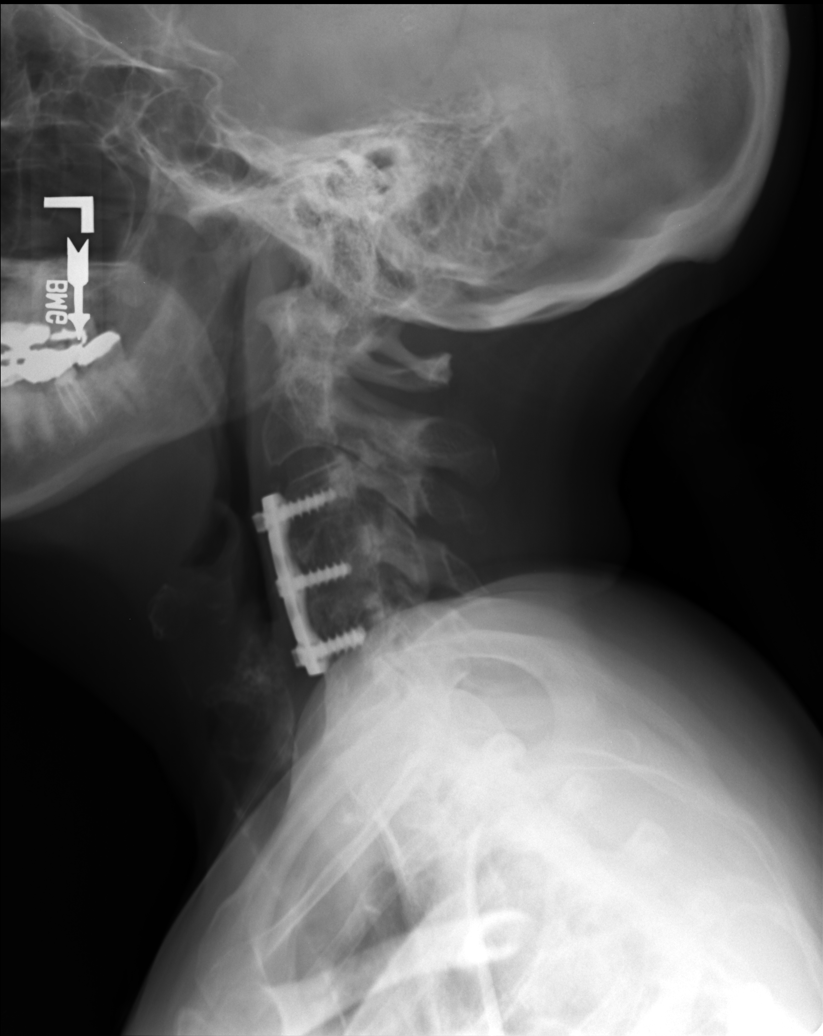

[AP]
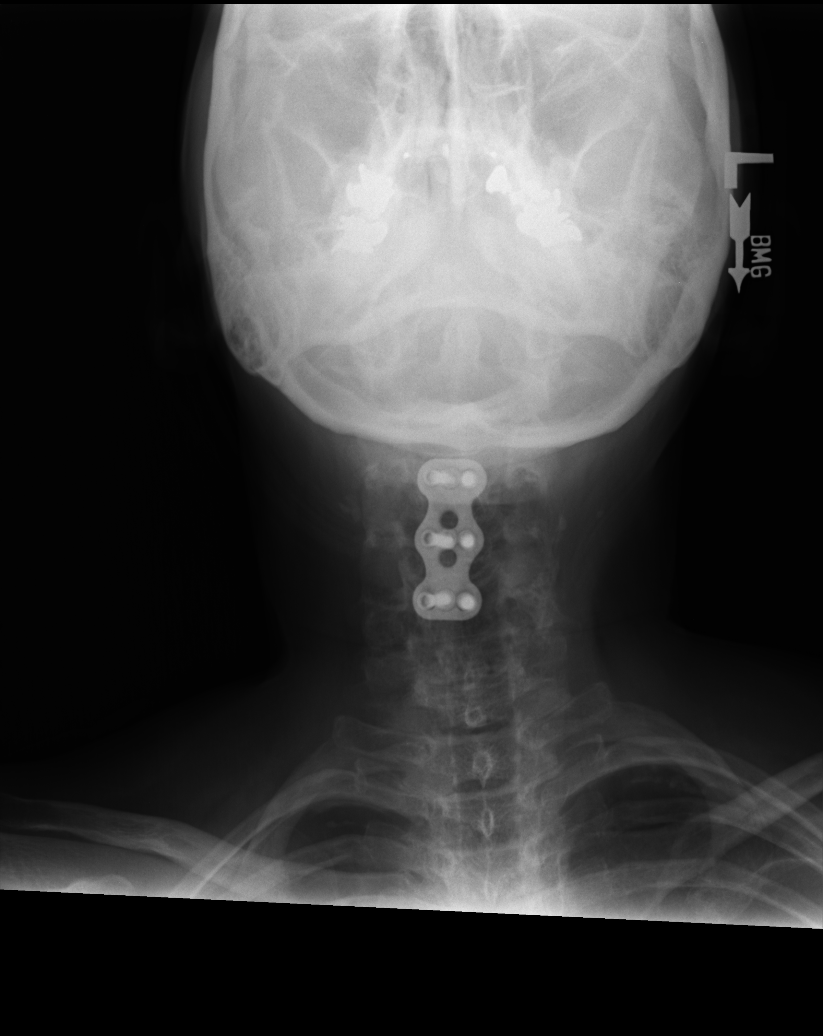

[ap open mouth]
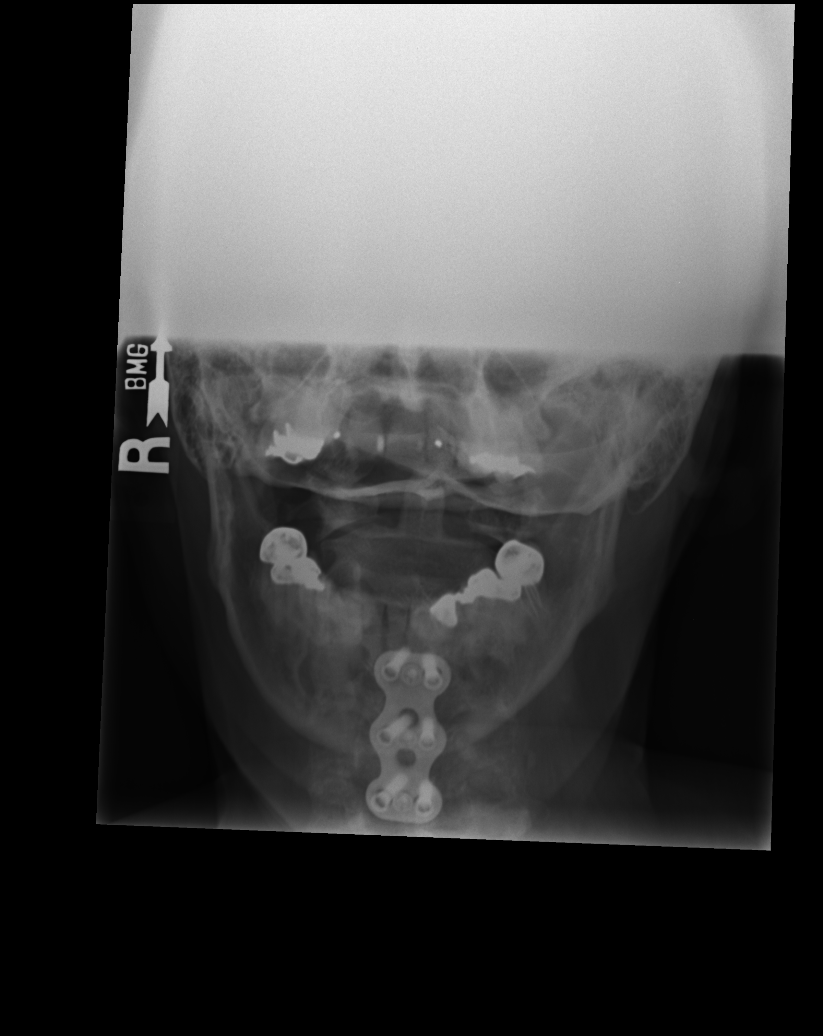

[lpo]
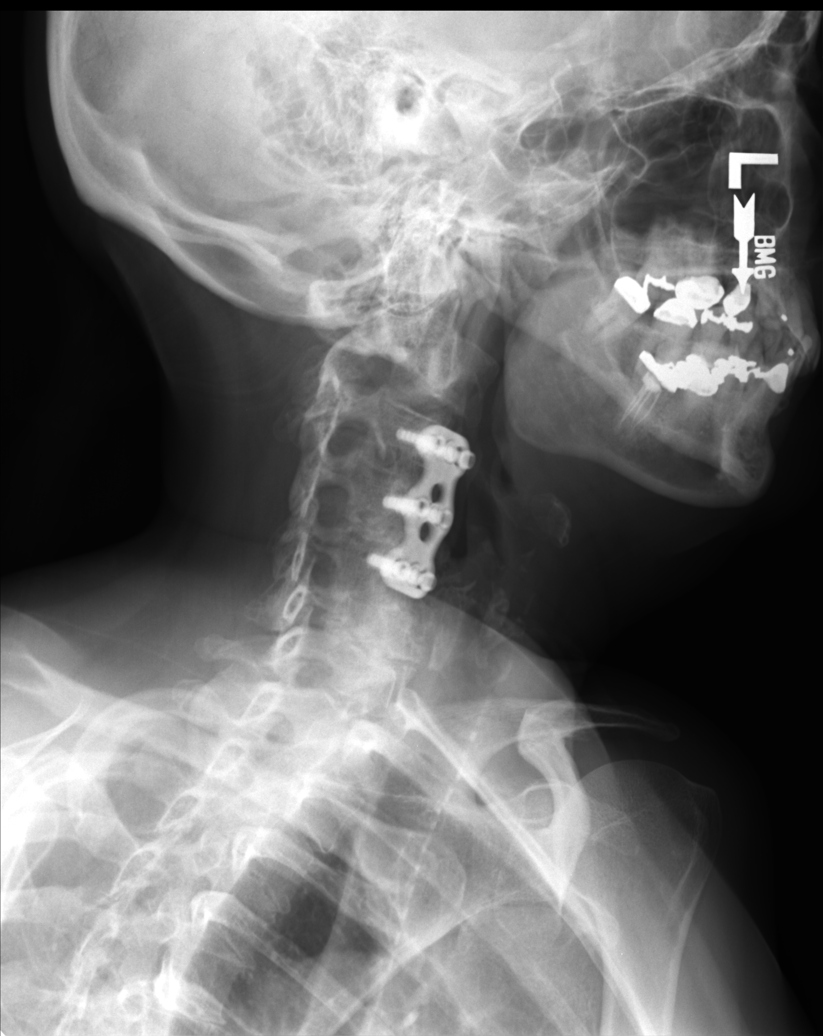

[rpo]
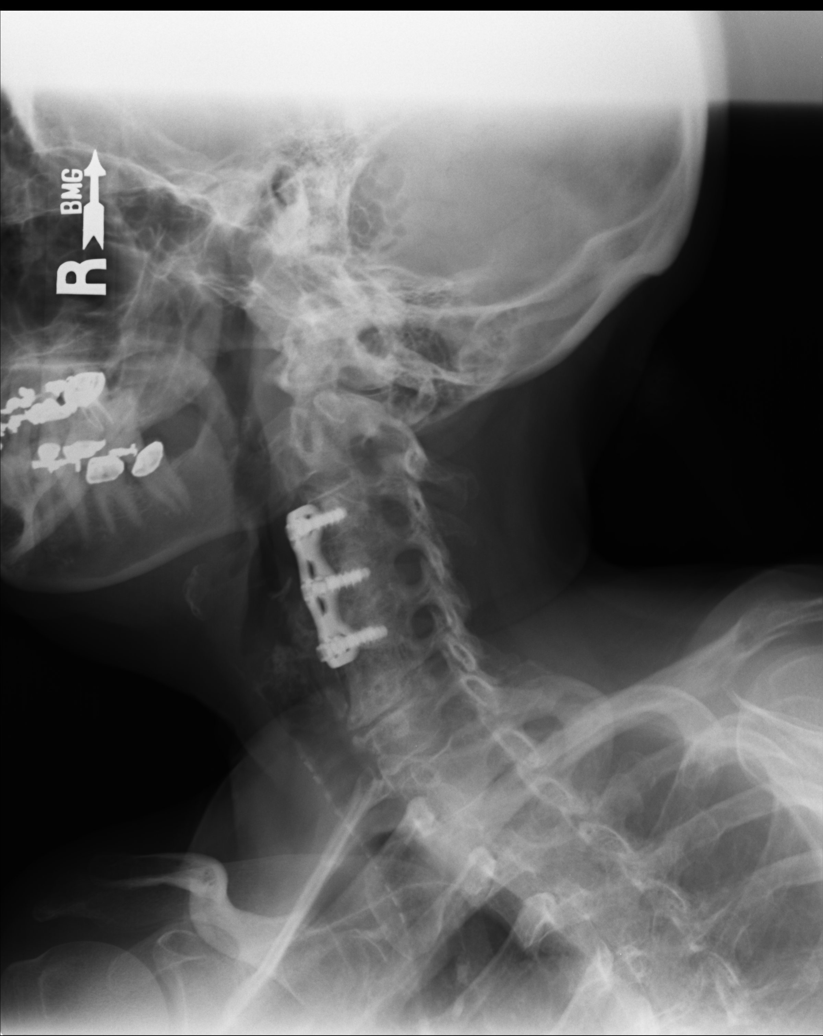

[5 of 5 positions shown; findings below may reference images not displayed]

FINDINGS: C3-C4 and C4-C5 ACDF hardware re-identified and appears
stable in configuration.  Previous C5-C6 ACDF with no C6 level
hardware.  C6-C7 disc space narrowing and endplate spurring.  Mild
anterolisthesis of C2 on C3 is stable.  Prevertebral soft tissue
contours are stable within normal limits.  AP alignment within
normal limits.  Lung apices are stable with mild calcified scarring
or great vessel atherosclerosis.  There is cervical carotid
calcified atherosclerosis re-identified.  On today's left oblique
view there is a fracture through the left C4 screw evident.  The
other levels appear intact on these views. Bilateral posterior
element alignment is within normal limits.
IMPRESSION: 1.  Stable postoperative appearance of the cervical spine except
for evidence of left C4 cortical screw fracture (see oblique view).
2.  Chronic adjacent segment disease at C6-C7.  Stable mild
anterolisthesis at C2-C3.

## 2013-01-13 IMAGING — CR DG SHOULDER 2+V*L*
3 series · 3 of 3 positions shown · non-contrast
Comparison: Chest radiographs [DATE].

CLINICAL DATA: 51-year-old female with joint pain left shoulder.

LEFT SHOULDER - 2+ VIEW

[ap int rot]
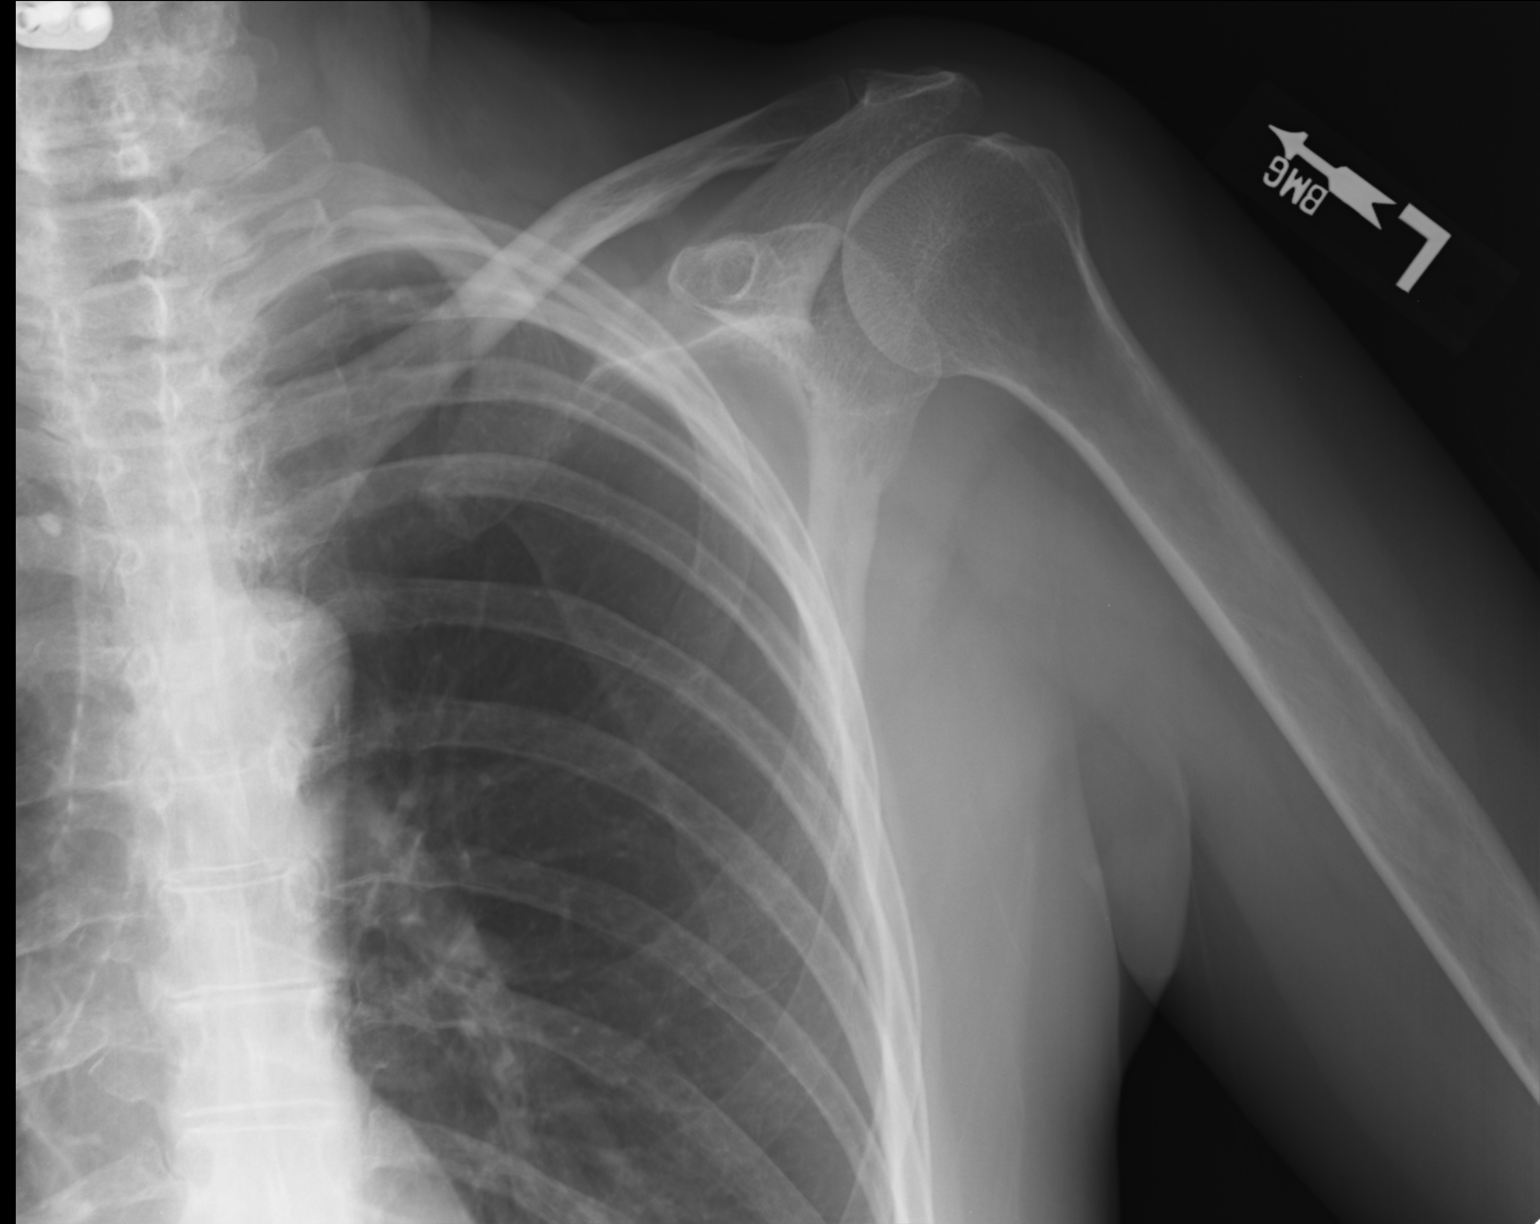

[ap ext rot]
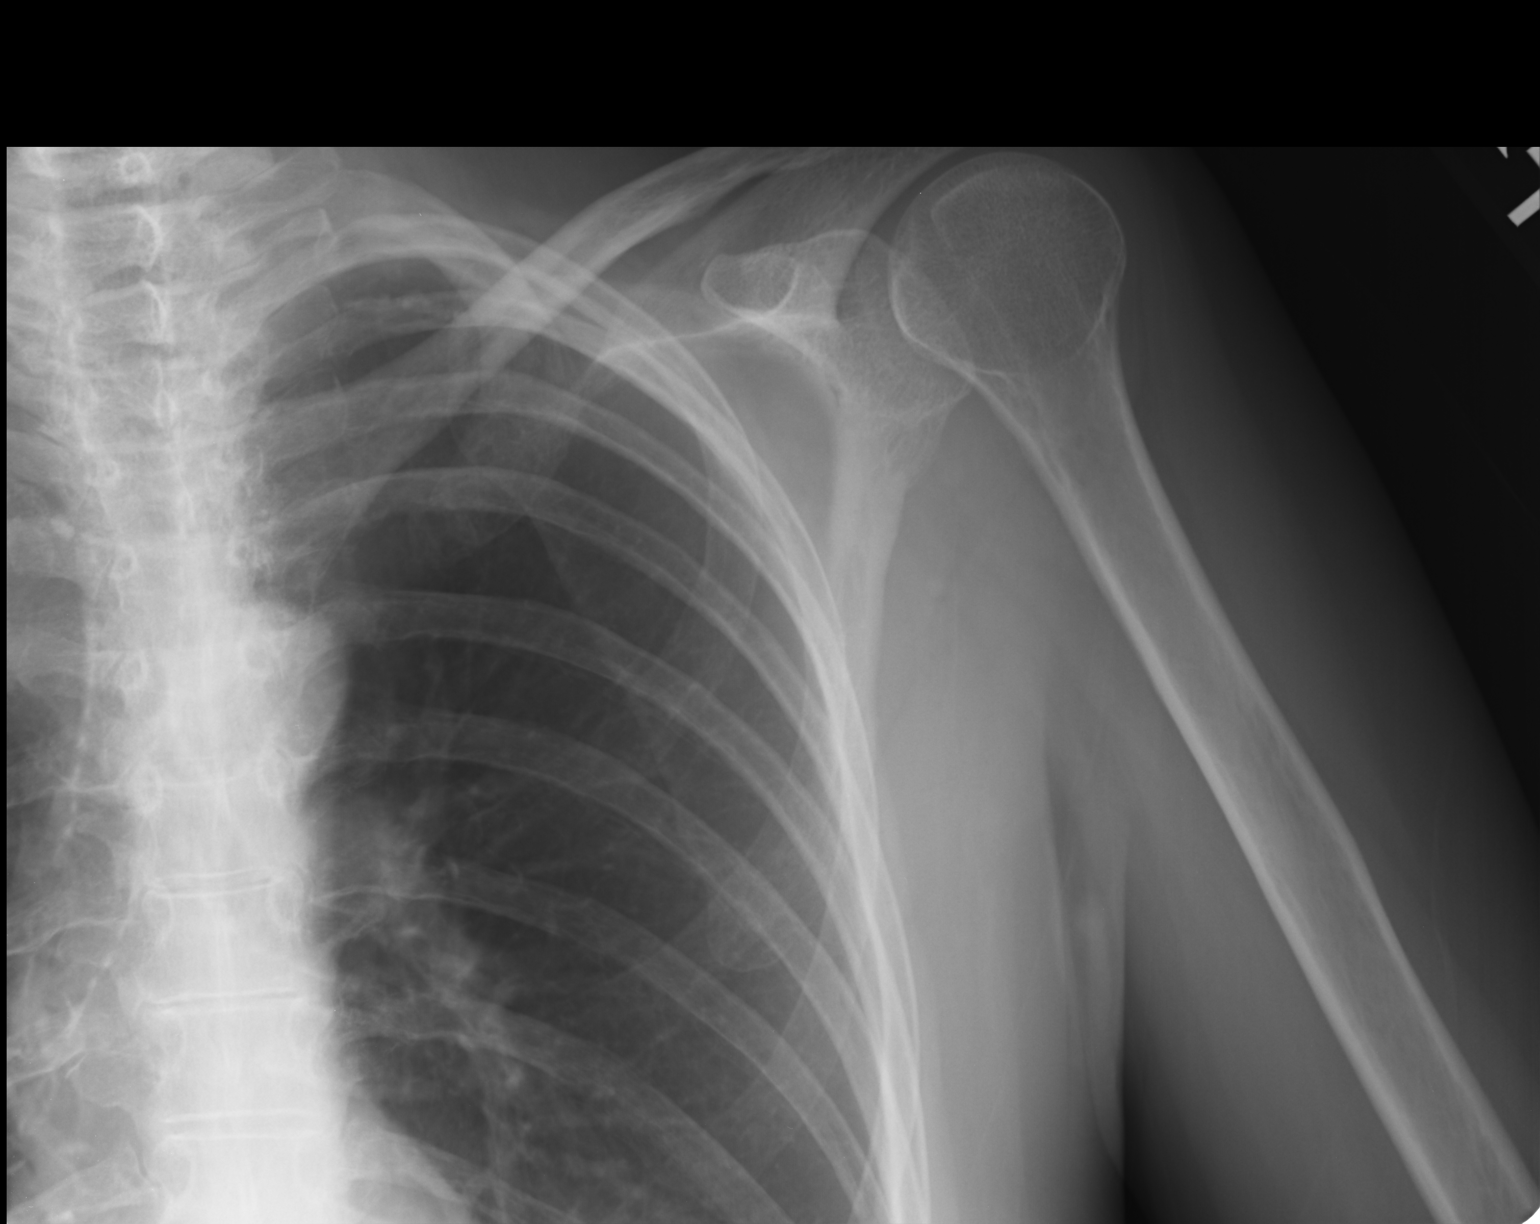

[AP]
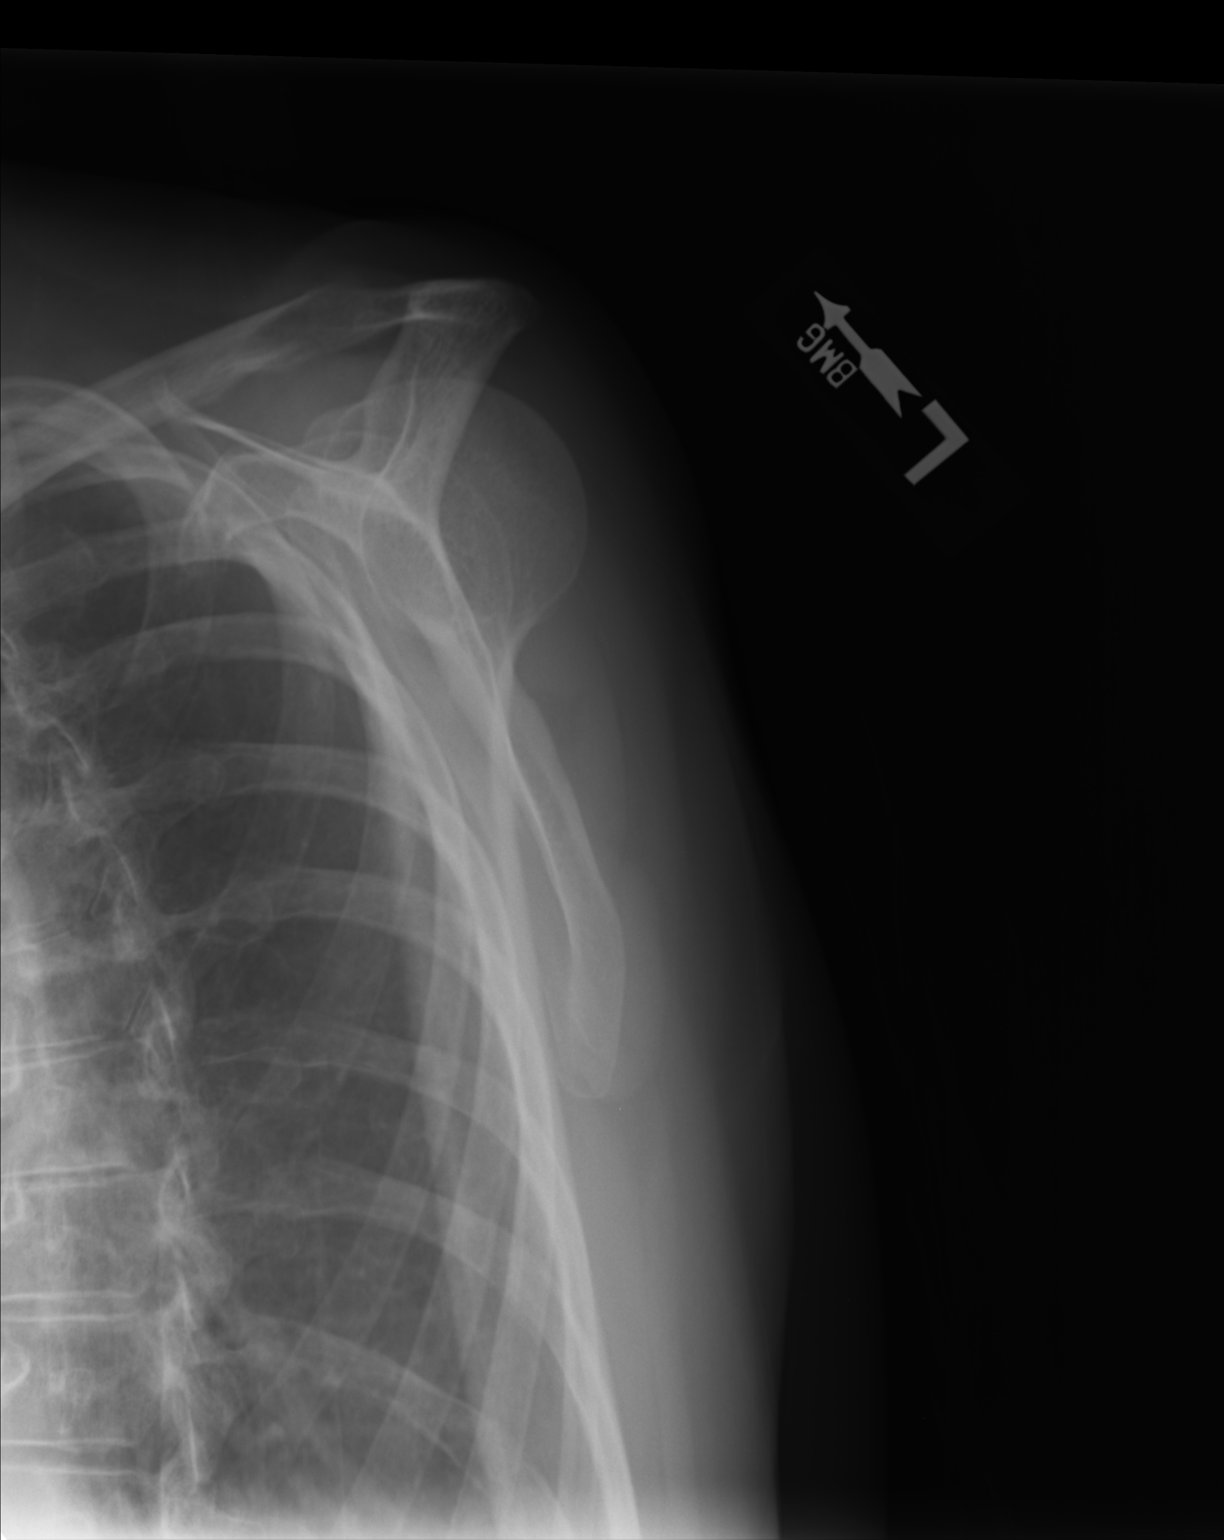

[3 of 3 positions shown; findings below may reference images not displayed]

FINDINGS: No glenohumeral joint dislocation.   Bone mineralization
is within normal limits.  Proximal left humerus intact.  Left
clavicle and scapula intact.  Visualized left ribs and lung
parenchyma within normal limits.  Cervical ACDF hardware partially
visible.
IMPRESSION: No acute osseous abnormality identified about the left shoulder.

## 2013-01-13 MED ORDER — CYCLOBENZAPRINE HCL 10 MG PO TABS: 10.0000 mg | ORAL_TABLET | Freq: Three times a day (TID) | ORAL | Status: DC | PRN

## 2013-01-13 MED ORDER — NAPROXEN SODIUM 550 MG PO TABS: 550.0000 mg | ORAL_TABLET | Freq: Two times a day (BID) | ORAL | Status: DC
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
## 2013-01-13 NOTE — Patient Instructions (Signed)

## 2013-01-26 ENCOUNTER — Emergency Department (HOSPITAL_COMMUNITY): Payer: BC Managed Care – PPO

## 2013-01-26 ENCOUNTER — Other Ambulatory Visit: Payer: Self-pay

## 2013-01-26 ENCOUNTER — Encounter (HOSPITAL_COMMUNITY): Payer: Self-pay | Admitting: *Deleted

## 2013-01-26 ENCOUNTER — Inpatient Hospital Stay (HOSPITAL_COMMUNITY)
Admission: EM | Admit: 2013-01-26 | Discharge: 2013-01-29 | DRG: 551 | Disposition: A | Discharge status: Home / Self Care | Payer: BC Managed Care – PPO | Attending: Internal Medicine | Admitting: Internal Medicine

## 2013-01-26 DIAGNOSIS — K859 Acute pancreatitis without necrosis or infection, unspecified: Secondary | ICD-10-CM | POA: Diagnosis present

## 2013-01-26 DIAGNOSIS — F1010 Alcohol abuse, uncomplicated: Secondary | ICD-10-CM | POA: Diagnosis present

## 2013-01-26 DIAGNOSIS — D649 Anemia, unspecified: Secondary | ICD-10-CM | POA: Diagnosis present

## 2013-01-26 DIAGNOSIS — E860 Dehydration: Secondary | ICD-10-CM | POA: Diagnosis present

## 2013-01-26 DIAGNOSIS — Z885 Allergy status to narcotic agent status: Secondary | ICD-10-CM

## 2013-01-26 DIAGNOSIS — K219 Gastro-esophageal reflux disease without esophagitis: Secondary | ICD-10-CM | POA: Diagnosis present

## 2013-01-26 DIAGNOSIS — Z9089 Acquired absence of other organs: Secondary | ICD-10-CM

## 2013-01-26 DIAGNOSIS — R7989 Other specified abnormal findings of blood chemistry: Secondary | ICD-10-CM | POA: Diagnosis present

## 2013-01-26 DIAGNOSIS — F17200 Nicotine dependence, unspecified, uncomplicated: Secondary | ICD-10-CM | POA: Diagnosis present

## 2013-01-26 DIAGNOSIS — E871 Hypo-osmolality and hyponatremia: Secondary | ICD-10-CM | POA: Diagnosis present

## 2013-01-26 DIAGNOSIS — Z882 Allergy status to sulfonamides status: Secondary | ICD-10-CM

## 2013-01-26 DIAGNOSIS — R748 Abnormal levels of other serum enzymes: Secondary | ICD-10-CM

## 2013-01-26 DIAGNOSIS — Z79899 Other long term (current) drug therapy: Secondary | ICD-10-CM

## 2013-01-26 DIAGNOSIS — E876 Hypokalemia: Secondary | ICD-10-CM | POA: Diagnosis present

## 2013-01-26 DIAGNOSIS — K7689 Other specified diseases of liver: Secondary | ICD-10-CM | POA: Diagnosis present

## 2013-01-26 DIAGNOSIS — R112 Nausea with vomiting, unspecified: Secondary | ICD-10-CM

## 2013-01-26 DIAGNOSIS — K209 Esophagitis, unspecified without bleeding: Principal | ICD-10-CM | POA: Diagnosis present

## 2013-01-26 DIAGNOSIS — E039 Hypothyroidism, unspecified: Secondary | ICD-10-CM | POA: Diagnosis present

## 2013-01-26 DIAGNOSIS — R197 Diarrhea, unspecified: Secondary | ICD-10-CM

## 2013-01-26 LAB — COMPREHENSIVE METABOLIC PANEL
ALT: 59 U/L — ABNORMAL HIGH (ref 0–35)
AST: 127 U/L — ABNORMAL HIGH (ref 0–37)
Albumin: 5.1 g/dL (ref 3.5–5.2)
Alkaline Phosphatase: 215 U/L — ABNORMAL HIGH (ref 39–117)
BUN: 10 mg/dL (ref 6–23)
CO2: 18 mEq/L — ABNORMAL LOW (ref 19–32)
Calcium: 11.5 mg/dL — ABNORMAL HIGH (ref 8.4–10.5)
Chloride: 82 mEq/L — ABNORMAL LOW (ref 96–112)
Creatinine, Ser: 1.05 mg/dL (ref 0.50–1.10)
GFR calc Af Amer: 70 mL/min — ABNORMAL LOW (ref >90)
GFR calc non Af Amer: 60 mL/min — ABNORMAL LOW (ref >90)
Glucose, Bld: 123 mg/dL — ABNORMAL HIGH (ref 70–99)
Potassium: 3.1 mEq/L — ABNORMAL LOW (ref 3.5–5.1)
Sodium: 139 mEq/L (ref 135–145)
Total Bilirubin: 0.7 mg/dL (ref 0.3–1.2)
Total Protein: 9.7 g/dL — ABNORMAL HIGH (ref 6.0–8.3)

## 2013-01-26 LAB — URINE MICROSCOPIC-ADD ON

## 2013-01-26 LAB — CBC WITH DIFFERENTIAL/PLATELET
Basophils Absolute: 0.1 10*3/uL (ref 0.0–0.1)
Basophils Relative: 1 % (ref 0–1)
Eosinophils Absolute: 0 10*3/uL (ref 0.0–0.7)
Eosinophils Relative: 0 % (ref 0–5)
HCT: 34.3 % — ABNORMAL LOW (ref 36.0–46.0)
Hemoglobin: 10.8 g/dL — ABNORMAL LOW (ref 12.0–15.0)
Lymphocytes Relative: 5 % — ABNORMAL LOW (ref 12–46)
Lymphs Abs: 0.6 10*3/uL — ABNORMAL LOW (ref 0.7–4.0)
MCH: 27.4 pg (ref 26.0–34.0)
MCHC: 31.5 g/dL (ref 30.0–36.0)
MCV: 87.1 fL (ref 78.0–100.0)
Monocytes Absolute: 1.7 10*3/uL — ABNORMAL HIGH (ref 0.1–1.0)
Monocytes Relative: 13 % — ABNORMAL HIGH (ref 3–12)
Neutro Abs: 10.2 10*3/uL — ABNORMAL HIGH (ref 1.7–7.7)
Neutrophils Relative %: 81 % — ABNORMAL HIGH (ref 43–77)
Platelets: 479 10*3/uL — ABNORMAL HIGH (ref 150–400)
RBC: 3.94 MIL/uL (ref 3.87–5.11)
RDW: 20 % — ABNORMAL HIGH (ref 11.5–15.5)
WBC: 12.6 10*3/uL — ABNORMAL HIGH (ref 4.0–10.5)

## 2013-01-26 LAB — URINALYSIS, ROUTINE W REFLEX MICROSCOPIC
Bilirubin Urine: NEGATIVE
Glucose, UA: NEGATIVE mg/dL
Hgb urine dipstick: NEGATIVE
Ketones, ur: >80 mg/dL — AB
Nitrite: NEGATIVE
Protein, ur: 30 mg/dL — AB
Specific Gravity, Urine: 1.046 — ABNORMAL HIGH (ref 1.005–1.030)
Urobilinogen, UA: 0.2 mg/dL (ref 0.0–1.0)
pH: 6.5 (ref 5.0–8.0)

## 2013-01-26 LAB — LIPASE, BLOOD: Lipase: 583 U/L — ABNORMAL HIGH (ref 11–59)

## 2013-01-26 LAB — TROPONIN I: Troponin I: <0.3 ng/mL (ref <0.30)

## 2013-01-26 IMAGING — CR DG CHEST 2V
2 series · 2 of 2 positions shown · non-contrast
Comparison: [DATE].

CLINICAL DATA: Chest pain.

CHEST - 2 VIEW

[w chest lat]
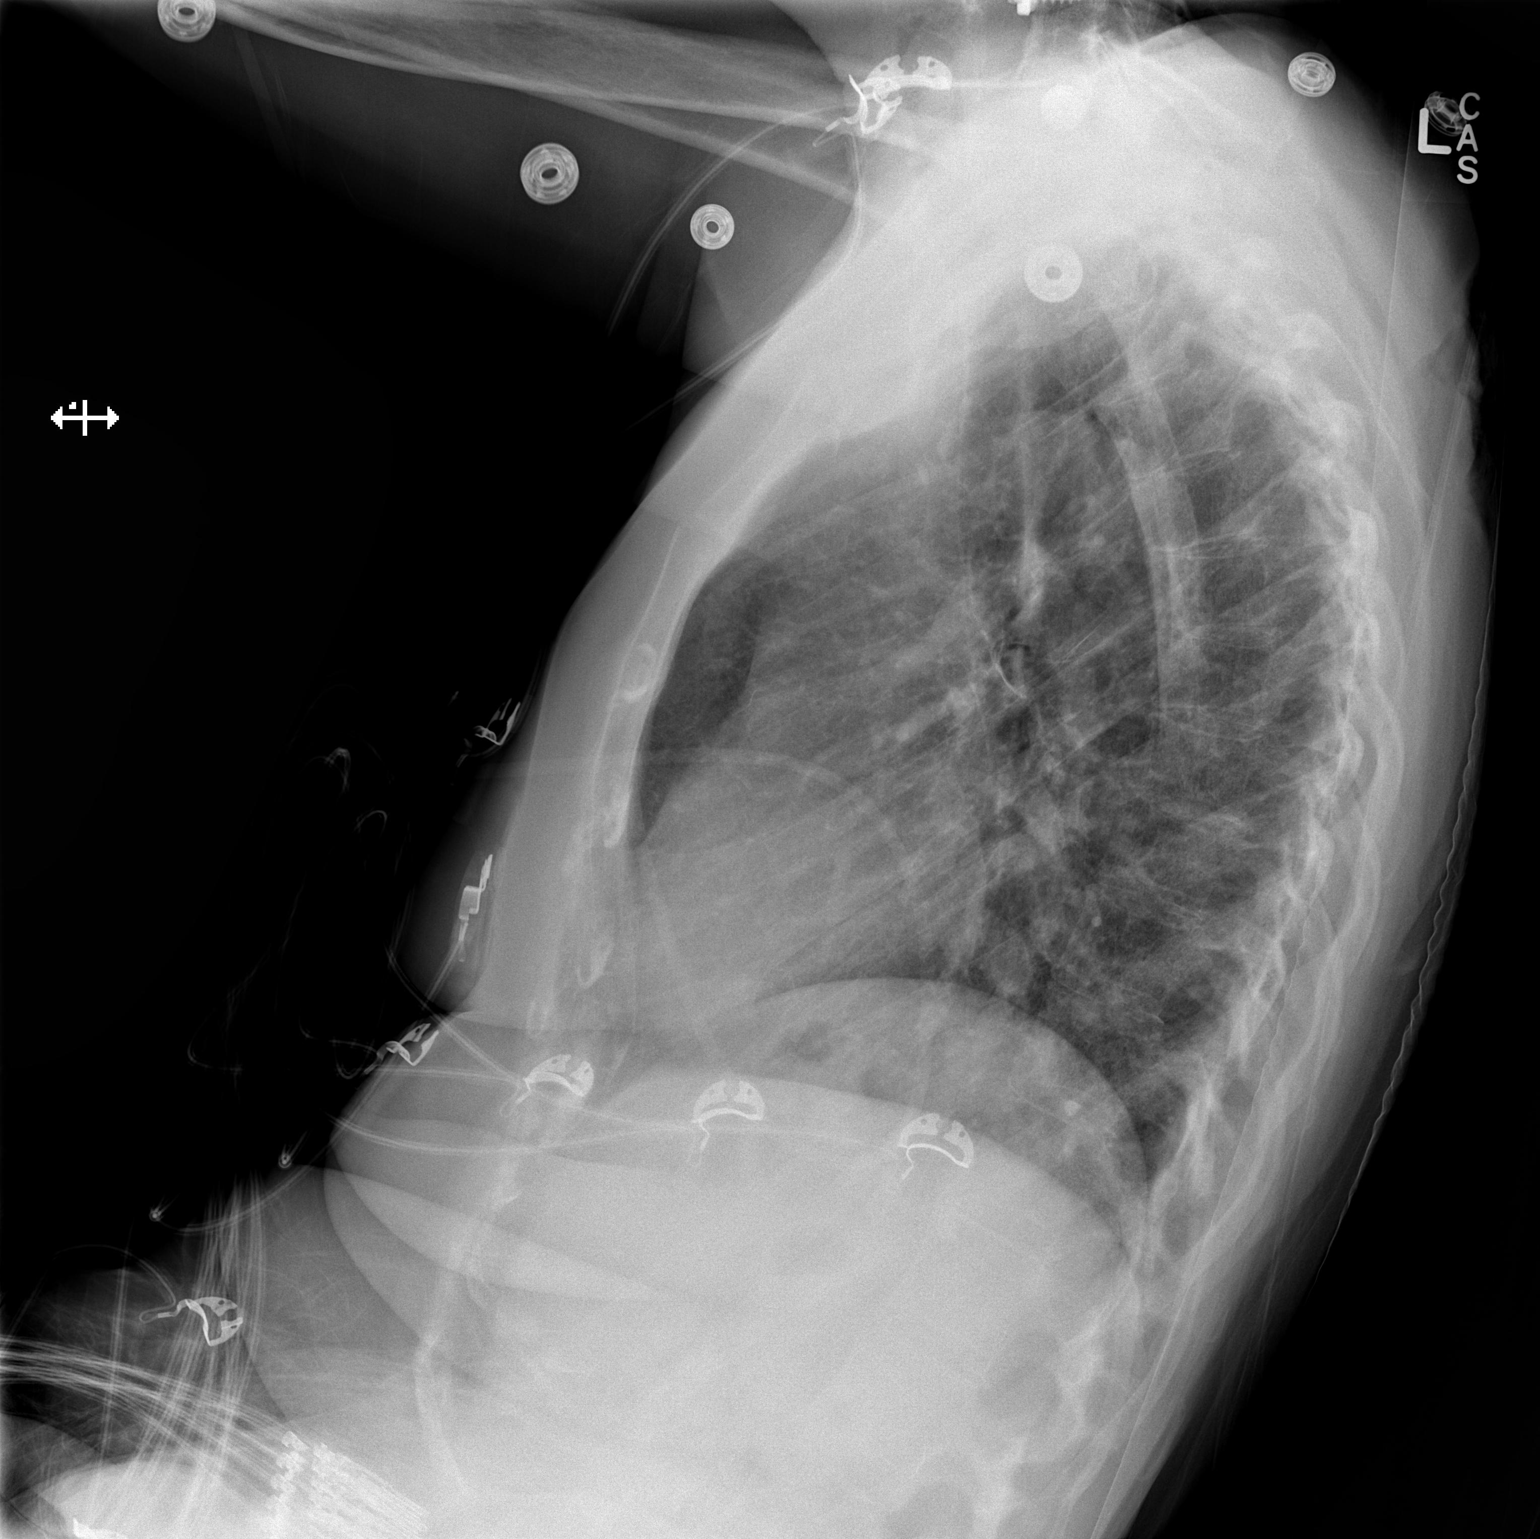

[x chest ap]
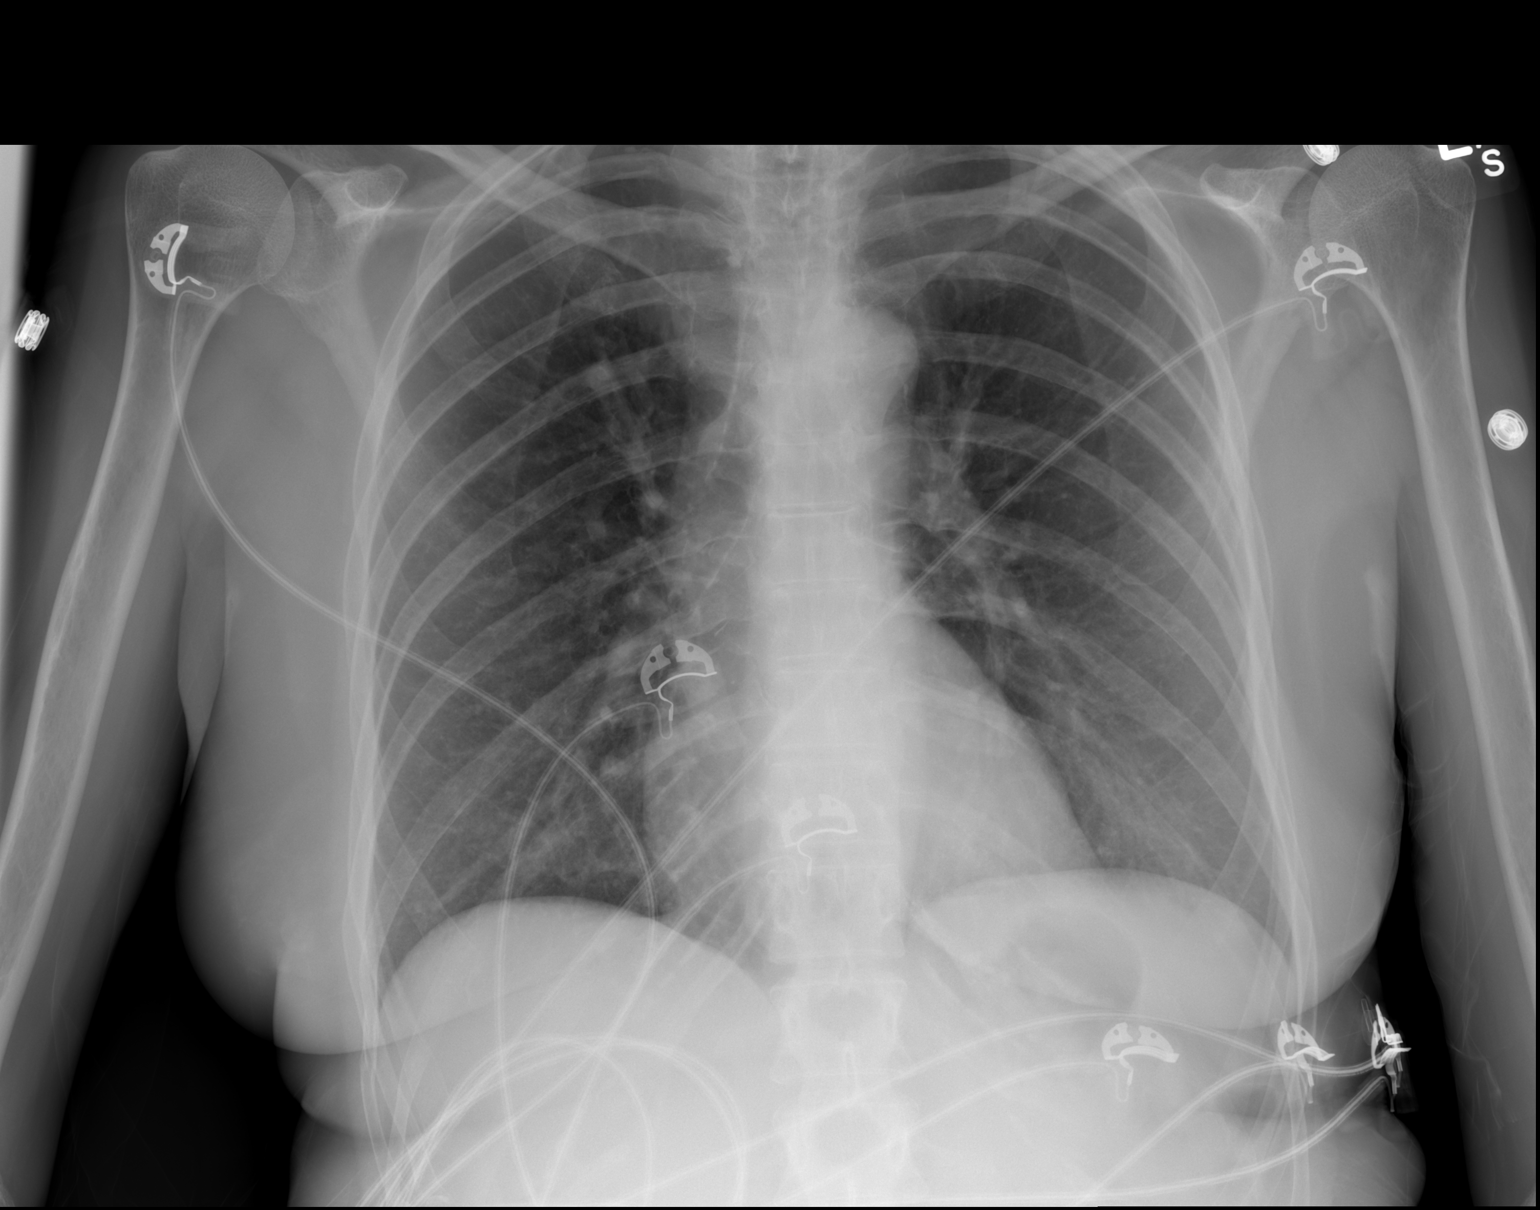

[2 of 2 positions shown; findings below may reference images not displayed]

FINDINGS: Normal sized heart.  Clear lungs.  Normal appearing
bones.
IMPRESSION: Normal examination.

## 2013-01-26 IMAGING — CT CT ABD-PELV W/ CM
1 of 3 series · 14 of 32 positions shown, 19 images · IV contrast (OMNIPAQUE 300)
Comparison: [DATE].

CLINICAL DATA: Epigastric abdominal pain, nausea, vomiting and
diarrhea.  Status post endoscopy 5 days ago.

CT ABDOMEN AND PELVIS WITH CONTRAST
TECHNIQUE: Multidetector CT imaging of the abdomen and pelvis was
performed following the standard protocol during bolus
administration of intravenous contrast.
Contrast: 100mL OMNIPAQUE IOHEXOL 300 MG/ML  SOLN, 50mL OMNIPAQUE
IOHEXOL 300 MG/ML  SOLN

[Series 2: abd/pel with · axial · 0.67mm/px · z∈[+1093,+1433]mm · 14 of 78 slices shown, 19 images]
[im 5/78  soft-tissue]
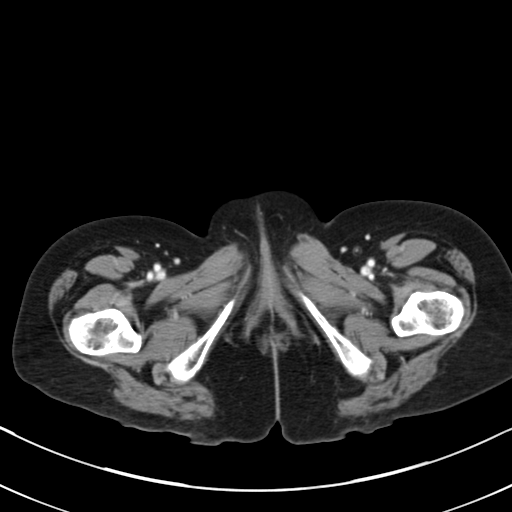
[im 5/78  bone]
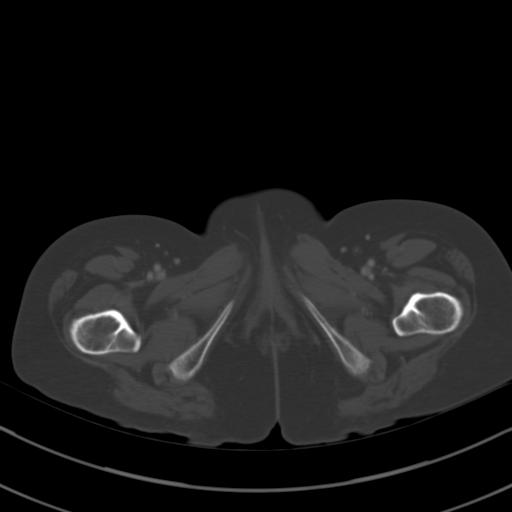
[im 13/78  soft-tissue]
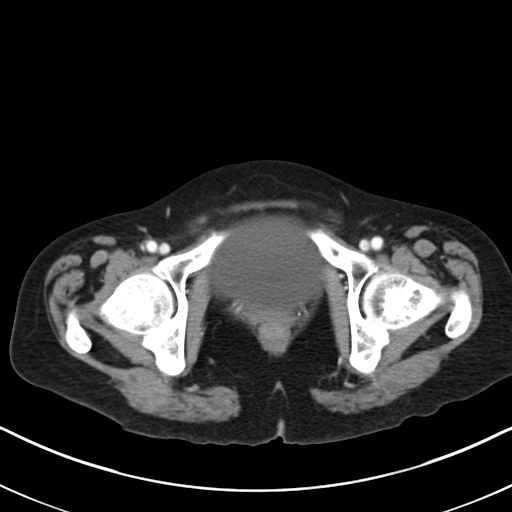
[im 17/78  soft-tissue]
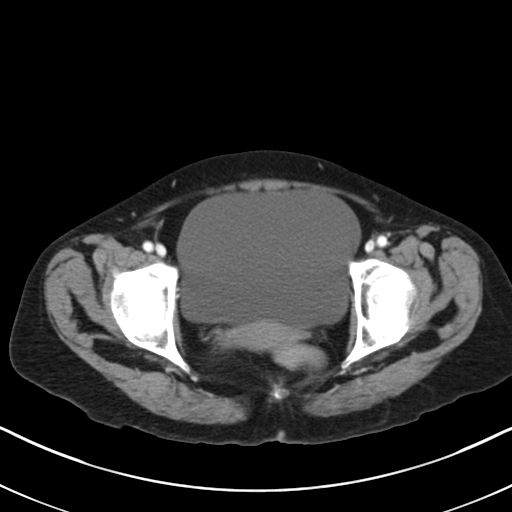
[im 21/78  soft-tissue]
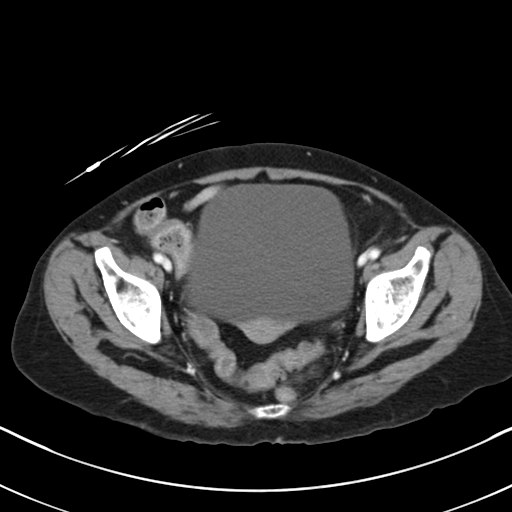
[im 29/78  soft-tissue]
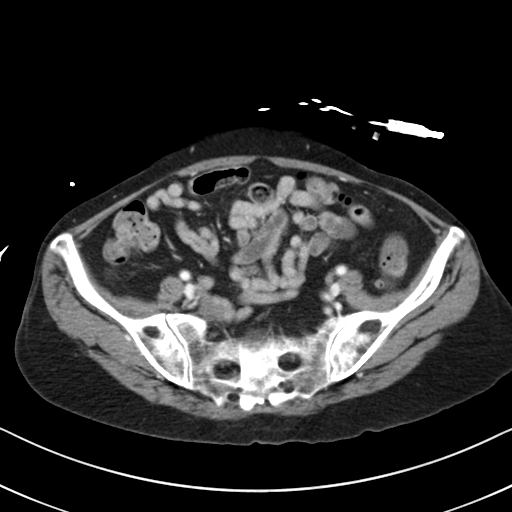
[im 33/78  soft-tissue]
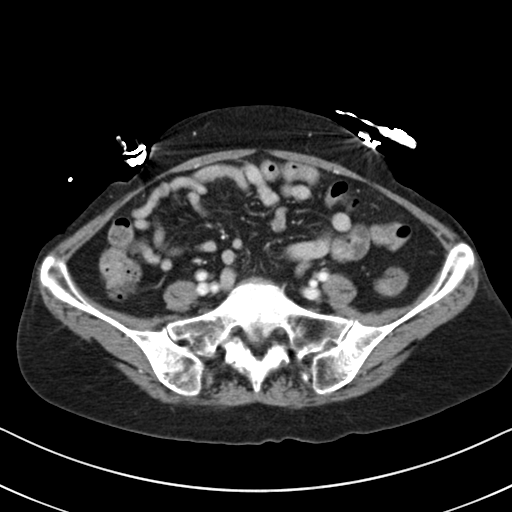
[im 41/78  soft-tissue]
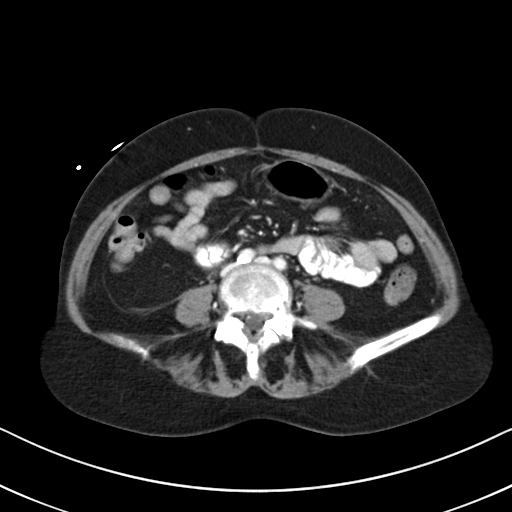
[im 45/78  soft-tissue]
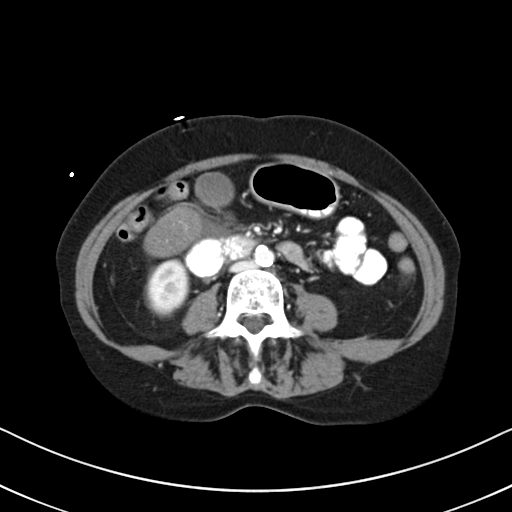
[im 49/78  soft-tissue]
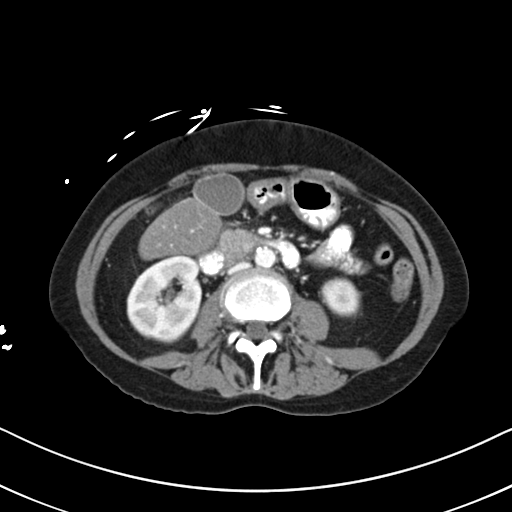
[im 49/78  bone]
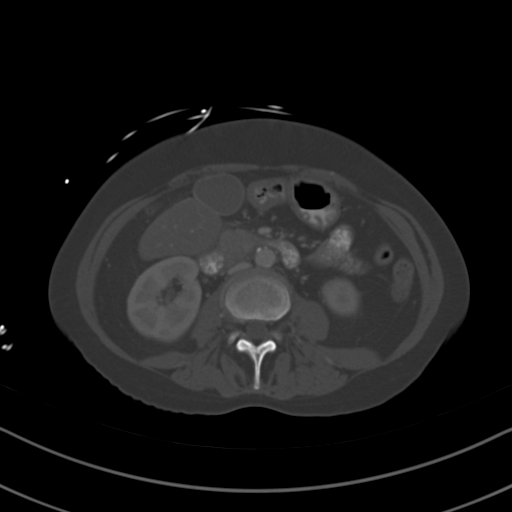
[im 57/78  soft-tissue]
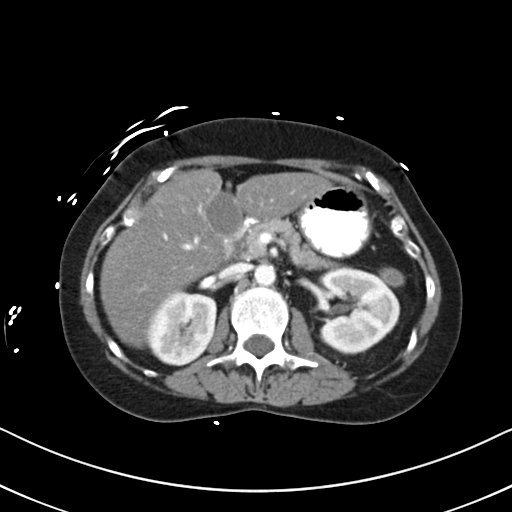
[im 61/78  soft-tissue]
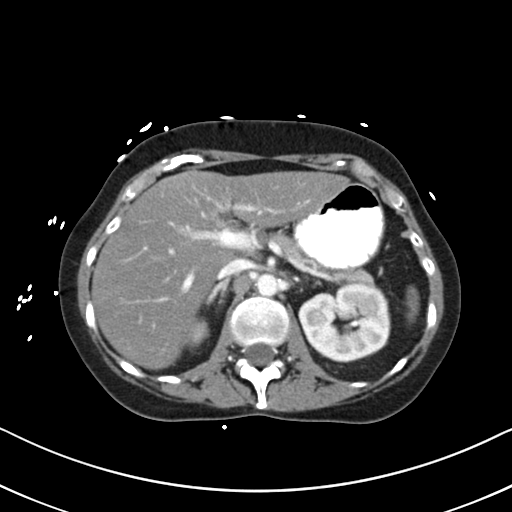
[im 61/78  lung]
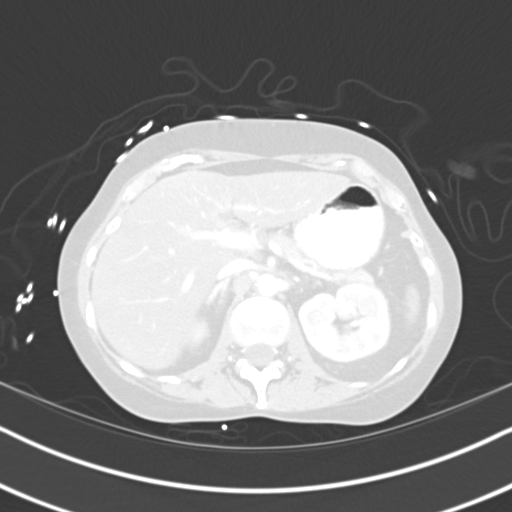
[im 65/78  soft-tissue]
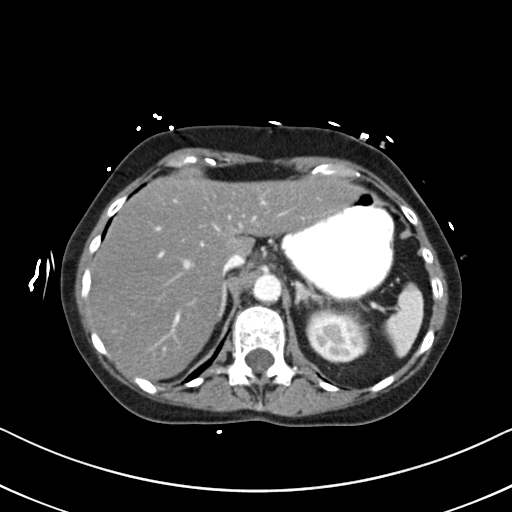
[im 65/78  lung]
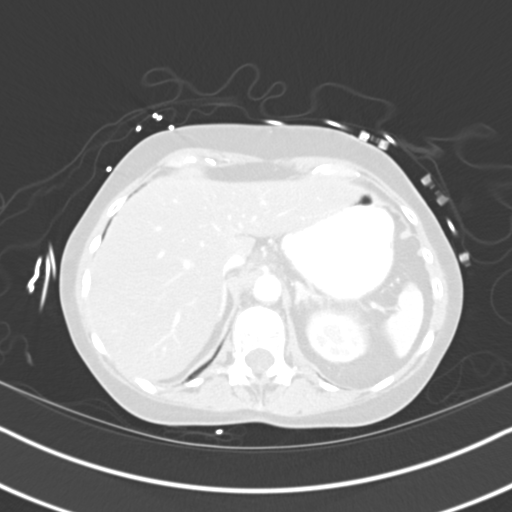
[im 69/78  lung]
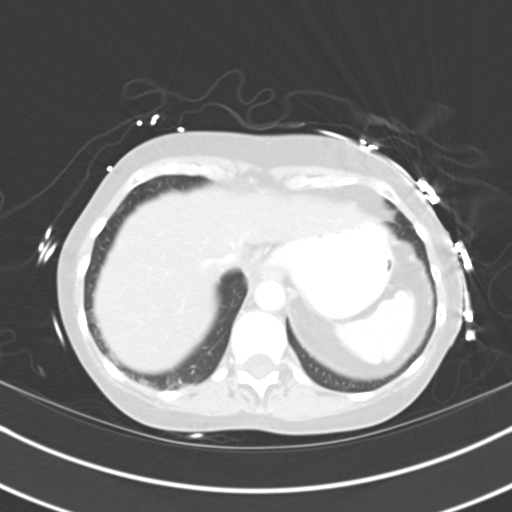
[im 73/78  soft-tissue]
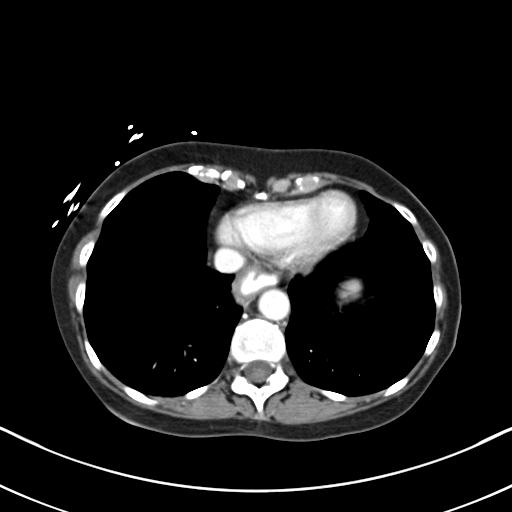
[im 73/78  lung]
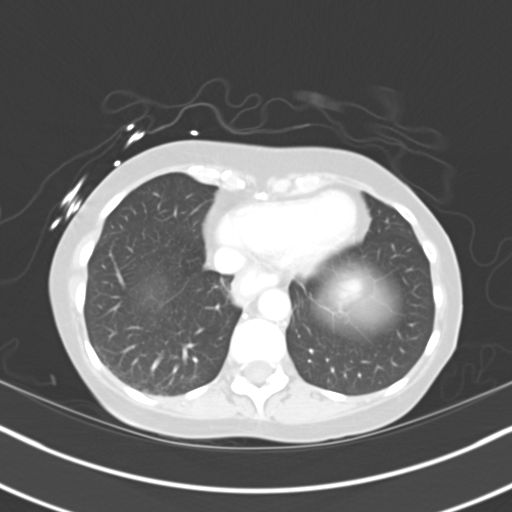

[14 of 32 positions shown; findings below may reference images not displayed]

FINDINGS: Small sliding hiatal hernia.  Diffuse distal esophageal
wall thickening with a maximum thickness of the 9 mm.

Diffuse low density of the liver relative to the spleen.  Better
visualized small cyst in the midportion of the right kidney,
measuring 4 mm in diameter on image number 18 of series 7.
Unremarkable left kidney, ureters and urinary bladder.  No urinary
tract calculi or hydronephrosis seen.

Unremarkable spleen, pancreas, gallbladder, adrenal glands, uterus
and ovaries.  No intestinal abnormalities or enlarged lymph nodes.
Atheromatous arterial calcifications without aneurysm or
dissection.  Minimal dependent atelectasis at the right lung base.
Minimal lumbar spine degenerative changes.
IMPRESSION: 1.  Moderate diffuse distal esophageal wall thickening, most likely
due to esophagitis.  A concentric neoplasm is less likely.
Correlation with the recent endoscopy findings is necessary.
2.  Small sliding hiatal hernia.
3.  Diffuse hepatic steatosis.

## 2013-01-26 MED ORDER — SODIUM CHLORIDE 0.9 % IV BOLUS (SEPSIS)
1000.0000 mL | Freq: Once | INTRAVENOUS | Status: AC
  Administered 2013-01-26: 1000 mL via INTRAVENOUS

## 2013-01-26 MED ORDER — SUCRALFATE 1 GM/10ML PO SUSP
1.0000 g | Freq: Three times a day (TID) | ORAL | Status: DC
  Administered 2013-01-27 – 2013-01-29 (×11): 1 g via ORAL
  Filled 2013-01-26 (×13): qty 10

## 2013-01-26 MED ORDER — ONDANSETRON HCL 4 MG/2ML IJ SOLN: 4.0000 mg | Freq: Three times a day (TID) | INTRAMUSCULAR | Status: DC | PRN

## 2013-01-26 MED ORDER — LORAZEPAM 1 MG PO TABS: 1.0000 mg | ORAL_TABLET | Freq: Four times a day (QID) | ORAL | Status: DC | PRN

## 2013-01-26 MED ORDER — SODIUM CHLORIDE 0.9 % IV BOLUS (SEPSIS): 1000.0000 mL | Freq: Once | INTRAVENOUS | Status: DC

## 2013-01-26 MED ORDER — PANTOPRAZOLE SODIUM 40 MG IV SOLR
40.0000 mg | Freq: Two times a day (BID) | INTRAVENOUS | Status: DC
  Administered 2013-01-27 – 2013-01-28 (×3): 40 mg via INTRAVENOUS
  Filled 2013-01-26 (×4): qty 40

## 2013-01-26 MED ORDER — VITAMIN B-1 100 MG PO TABS
100.0000 mg | ORAL_TABLET | Freq: Every day | ORAL | Status: DC
  Administered 2013-01-27 – 2013-01-29 (×3): 100 mg via ORAL
  Filled 2013-01-26 (×3): qty 1

## 2013-01-26 MED ORDER — ONDANSETRON 8 MG PO TBDP: 8.0000 mg | ORAL_TABLET | Freq: Once | ORAL | Status: DC

## 2013-01-26 MED ORDER — THIAMINE HCL 100 MG/ML IJ SOLN
100.0000 mg | Freq: Every day | INTRAMUSCULAR | Status: DC
  Administered 2013-01-29: 10:00:00 via INTRAVENOUS
  Filled 2013-01-26 (×3): qty 1

## 2013-01-26 MED ORDER — ONDANSETRON HCL 4 MG/2ML IJ SOLN
4.0000 mg | Freq: Once | INTRAMUSCULAR | Status: AC
  Administered 2013-01-26: 4 mg via INTRAVENOUS
  Filled 2013-01-26: qty 2

## 2013-01-26 MED ORDER — NICOTINE 7 MG/24HR TD PT24
7.0000 mg | MEDICATED_PATCH | Freq: Every day | TRANSDERMAL | Status: DC
  Filled 2013-01-26 (×3): qty 1

## 2013-01-26 MED ORDER — IOHEXOL 300 MG/ML  SOLN
50.0000 mL | Freq: Once | INTRAMUSCULAR | Status: AC | PRN
  Administered 2013-01-26: 50 mL via ORAL

## 2013-01-26 MED ORDER — MORPHINE SULFATE 4 MG/ML IJ SOLN
4.0000 mg | Freq: Once | INTRAMUSCULAR | Status: AC
  Administered 2013-01-26: 4 mg via INTRAVENOUS
  Filled 2013-01-26: qty 1

## 2013-01-26 MED ORDER — SODIUM CHLORIDE 0.9 % IV SOLN: INTRAVENOUS | Status: DC

## 2013-01-26 MED ORDER — IOHEXOL 300 MG/ML  SOLN
100.0000 mL | Freq: Once | INTRAMUSCULAR | Status: AC | PRN
  Administered 2013-01-26: 100 mL via INTRAVENOUS

## 2013-01-26 MED ORDER — ONDANSETRON HCL 4 MG PO TABS: 4.0000 mg | ORAL_TABLET | Freq: Four times a day (QID) | ORAL | Status: DC | PRN

## 2013-01-26 MED ORDER — LORAZEPAM 2 MG/ML IJ SOLN: 1.0000 mg | Freq: Four times a day (QID) | INTRAMUSCULAR | Status: DC | PRN

## 2013-01-26 MED ORDER — CYCLOBENZAPRINE HCL 10 MG PO TABS
10.0000 mg | ORAL_TABLET | Freq: Three times a day (TID) | ORAL | Status: DC | PRN
  Filled 2013-01-26: qty 1

## 2013-01-26 MED ORDER — ONDANSETRON HCL 4 MG/2ML IJ SOLN: 4.0000 mg | Freq: Four times a day (QID) | INTRAMUSCULAR | Status: DC | PRN

## 2013-01-26 MED ORDER — SODIUM CHLORIDE 0.9 % IV SOLN
INTRAVENOUS | Status: DC
  Administered 2013-01-27 – 2013-01-28 (×4): via INTRAVENOUS

## 2013-01-26 NOTE — ED Provider Notes (Signed)
  Physical Exam  BP 167/100  Pulse 103  Temp(Src) 99 F (37.2 C) (Oral)  Resp 17  Wt 101 lb (45.813 kg)  BMI 18.47 kg/m2  SpO2 100%  Physical Exam Patient with continued abdominal pain nausea and vomiting   + bilateral upper abdominal pain  ED Course  Procedures  MDM Will admit for pain control       Arman Filter, NP 01/26/13 2210

## 2013-01-26 NOTE — H&P (Signed)
History and Physical  Holly Phelps ZOX:[id number] DOB: 07/15/1961 DOA: 01/26/2013  Referring physician: Earley Favor, NP in ED PCP: No primary provider on file.  GI: Dr. Elnoria Howard  Chief Complaint: Cannot keep anything down  HPI:  52 year old woman presented with four-day history of nausea vomiting and diarrhea. Initial evaluation notable for dehydration, elevated lipase, esophagitis on CT scan.  The patient reports routine screening colonoscopy Dr. Elnoria Howard 3/6. She also had an EGD done at that time, she is not aware of any problems with it. She denies a history of dyspepsia, acid reflux, solid or liquid dysphagia. On 3/7 she developed nausea, vomiting and diarrhea. This persisted throughout the weekend without relief and she has had minimal oral intake. She was seen by Dr. Elnoria Howard in the office today and referred to the emergency department.  In ED  Afebrile, HR 100-120s  CO2 18, chloride 82  WBC 12.6  CXR--negative  CT ab/pelvis--Moderate diffuse distal esophageal wall thickening, most likely due to esophagitis  EKG ST, no acute changes  Review of Systems:  Negative for fever, visual changes, sore throat, rash, new muscle aches, chest pain, SOB, dysuria, bleeding.  Past Medical History  Diagnosis Date  . Allergy   . Anemia     Past Surgical History  Procedure Laterality Date  . Appendectomy    . Cervicall fusion      Social History:  reports that she has been smoking Cigarettes.  She has been smoking about 0.50 packs per day. She does not have any smokeless tobacco history on file. She reports that  drinks alcohol. She reports that she does not use illicit drugs.  Allergies  Allergen Reactions  . Codeine Nausea And Vomiting  . Sulfa Antibiotics     Pt does not recall what the reaction was,been too long    Family History  Problem Relation Age of Onset  . Stroke Father   . Stroke Brother      Prior to Admission medications   Medication Sig Start Date End Date Taking?  Authorizing Provider  cyclobenzaprine (FLEXERIL) 10 MG tablet Take 1 tablet (10 mg total) by mouth 3 (three) times daily as needed for muscle spasms. 01/13/13  Yes Phillips Odor, MD  esomeprazole (NEXIUM) 40 MG capsule Take 40 mg by mouth daily before breakfast.   Yes Historical Provider, MD  ferrous sulfate 325 (65 FE) MG tablet Take 1 tablet (325 mg total) by mouth daily with breakfast. 12/26/12  Yes Collene Gobble, MD  naproxen sodium (ANAPROX DS) 550 MG tablet Take 1 tablet (550 mg total) by mouth 2 (two) times daily with a meal. 01/13/13 01/13/14 Yes Phillips Odor, MD   Physical Exam: Filed Vitals:   01/26/13 1845 01/26/13 1915 01/26/13 1939 01/26/13 1956  BP:   161/100 167/100  Pulse:   113 103  Temp:   99 F (37.2 C)   TempSrc:   Oral   Resp: 16 17 24 17   Weight:      SpO2:   98% 100%    General:  Examined in emergency department. Appears calm and comfortable Eyes: PERRL, normal lids, irises. Wears glasses. ENT: grossly normal hearing, lips & tongue Neck: no LAD, masses or thyromegaly Cardiovascular: Tachycardic, regular rhythm, no m/r/g. No LE edema. Respiratory: CTA bilaterally, no w/r/r. Normal respiratory effort. Abdomen: soft, ntnd, no epigastric pain, no right upper quadrant Skin: no rash or induration seen on limited exam Musculoskeletal: grossly normal tone BUE/BLE Psychiatric: grossly normal mood and affect, speech fluent and appropriate  Neurologic: grossly non-focal.  Wt Readings from Last 3 Encounters:  01/26/13 45.813 kg (101 lb)  01/13/13 48.081 kg (106 lb)  12/26/12 46.267 kg (102 lb)    Labs on Admission:  Basic Metabolic Panel:  Recent Labs Lab 01/26/13 1753  NA 139  K 3.1*  CL 82*  CO2 18*  GLUCOSE 123*  BUN 10  CREATININE 1.05  CALCIUM 11.5*    Liver Function Tests:  Recent Labs Lab 01/26/13 1753  AST 127*  ALT 59*  ALKPHOS 215*  BILITOT 0.7  PROT 9.7*  ALBUMIN 5.1    Recent Labs Lab 01/26/13 1753  LIPASE 583*     CBC:  Recent Labs Lab 01/26/13 1753  WBC 12.6*  NEUTROABS 10.2*  HGB 10.8*  HCT 34.3*  MCV 87.1  PLT 479*    Cardiac Enzymes:  Recent Labs Lab 01/26/13 1753  TROPONINI <0.30   Radiological Exams on Admission: Dg Chest 2 View  01/26/2013  *RADIOLOGY REPORT*  Clinical Data: Chest pain.  CHEST - 2 VIEW  Comparison: 12/26/2012.  Findings: Normal sized heart.  Clear lungs.  Normal appearing bones.  IMPRESSION: Normal examination.   Original Report Authenticated By: Beckie Salts, M.D.    Ct Abdomen Pelvis W Contrast  01/26/2013  *RADIOLOGY REPORT*  Clinical Data: Epigastric abdominal pain, nausea, vomiting and diarrhea.  Status post endoscopy 5 days ago.  CT ABDOMEN AND PELVIS WITH CONTRAST  Technique:  Multidetector CT imaging of the abdomen and pelvis was performed following the standard protocol during bolus administration of intravenous contrast.  Contrast: OMNIPAQUE IOHEXOL 300 MG/ML  SOLN, 50mL OMNIPAQUE IOHEXOL 300 MG/ML  SOLN  Comparison: 10/18/2011.  Findings: Small sliding hiatal hernia.  Diffuse distal esophageal wall thickening with a maximum thickness of the 9 mm.  Diffuse low density of the liver relative to the spleen.  Better visualized small cyst in the midportion of the right kidney, measuring 4 mm in diameter on image number 18 of series 7. Unremarkable left kidney, ureters and urinary bladder.  No urinary tract calculi or hydronephrosis seen.  Unremarkable spleen, pancreas, gallbladder, adrenal glands, uterus and ovaries.  No intestinal abnormalities or enlarged lymph nodes. Atheromatous arterial calcifications without aneurysm or dissection.  Minimal dependent atelectasis at the right lung base. Minimal lumbar spine degenerative changes.  IMPRESSION:  1.  Moderate diffuse distal esophageal wall thickening, most likely due to esophagitis.  A concentric neoplasm is less likely. Correlation with the recent endoscopy findings is necessary. 2.  Small sliding hiatal  hernia. 3.  Diffuse hepatic steatosis.   Original Report Authenticated By: Beckie Salts, M.D.     EKG: Independently reviewed. As above   Principal Problem:   Nausea vomiting and diarrhea Active Problems:   ALCOHOL ABUSE   LIVER FUNCTION TESTS, ABNORMAL, HX OF   Dehydration   Esophagitis   Elevated lipase   Assessment/Plan 1. Nausea, vomiting, diarrhea: Likely secondary to esophagitis. Etiology of diarrhea unclear. Low serum bicarbonate likely secondary to diarrhea. Favor a viral process. Appears clinically well, history does not suggest sepsis. 2. Dehydration: IV fluids. 3. Esophagitis: PPI. GI consultation in the morning for further recommendations. Patient denies NSAID use although Anaprox as listed as a medication. No evidence of perforation. 4. Status post colonoscopy, EGD 3/6: Results not available. 5. Elevated lipase: Denies abdominal pain, no epigastric pain. Lack of pain argues against acute pancreatitis. Secondary to recent procedure? Check fasting lipid panel. 6. Mixed LFT elevation: Chronic. Likely associated with known diffuse fatty infiltration of liver. 7.  Hypokalemia: Replete. 8. Chronic alcohol use, suspect abuse: Monitor for withdrawal. CIWA. 9. Cigarette smoker: Recommend cessation.  Code Status: Full code Family Communication: None present Disposition Plan/Anticipated LOS: Admit, 2-3 days  Time spent: 50 minutes  Brendia Sacks, MD  [facility] Pager [phone] 01/26/2013, 9:55 PM

## 2013-01-26 NOTE — ED Provider Notes (Signed)
History     CSN: [id number]  Arrival date & time 01/26/13  1601   First MD Initiated Contact with Patient 01/26/13 1723      Chief Complaint  Patient presents with  . Abdominal Pain    Epigastric  . Nausea  . Emesis  . Diarrhea    (Consider location/radiation/quality/duration/timing/severity/associated sxs/prior treatment) HPI Comments: Pt states that the symptoms started after a endoscopy that was done by Dr. Elnoria Howard 5 days ago:pt states that she was seen by gi again today and they did some labs and told her that if symptoms continue then she needed to follow up in the er:pt states that she has been unable to keep down food or fluids in the last 4 days:pt has not taken any medications for the symptoms  Patient is a 52 y.o. female presenting with abdominal pain, vomiting, and diarrhea. The history is provided by the patient. No language interpreter was used.  Abdominal Pain Pain location:  Epigastric Pain quality: aching   Pain radiates to:  Does not radiate Pain severity:  Severe Onset quality:  Gradual Duration:  4 days Timing:  Constant Progression:  Unchanged Relieved by:  Nothing Worsened by:  Nothing tried Ineffective treatments:  None tried Associated symptoms: diarrhea and vomiting   Associated symptoms: no cough and no fever   Emesis Associated symptoms: abdominal pain and diarrhea   Diarrhea Associated symptoms: abdominal pain and vomiting   Associated symptoms: no fever     Past Medical History  Diagnosis Date  . Allergy   . Anemia     Past Surgical History  Procedure Laterality Date  . Appendectomy    . Cervicall fusion      History reviewed. No pertinent family history.  History  Substance Use Topics  . Smoking status: Current Every Day Smoker -- 0.50 packs/day    Types: Cigarettes  . Smokeless tobacco: Not on file     Comment: 3 cigarettes per day  . Alcohol Use: Yes     Comment: occasional    OB History   Grav Para Term Preterm Abortions  TAB SAB Ect Mult Living                  Review of Systems  Constitutional: Negative for fever.  Respiratory: Negative for cough.   Cardiovascular: Negative.   Gastrointestinal: Positive for vomiting, abdominal pain and diarrhea.    Allergies  Codeine and Sulfa antibiotics  Home Medications   Current Outpatient Rx  Name  Route  Sig  Dispense  Refill  . cyclobenzaprine (FLEXERIL) 10 MG tablet   Oral   Take 1 tablet (10 mg total) by mouth 3 (three) times daily as needed for muscle spasms.   30 tablet   0   . esomeprazole (NEXIUM) 40 MG capsule   Oral   Take 40 mg by mouth daily before breakfast.         . ferrous sulfate 325 (65 FE) MG tablet   Oral   Take 1 tablet (325 mg total) by mouth daily with breakfast.   90 tablet   3   . naproxen sodium (ANAPROX DS) 550 MG tablet   Oral   Take 1 tablet (550 mg total) by mouth 2 (two) times daily with a meal.   40 tablet   0     BP 157/99  Pulse 74  Temp(Src) 98.2 F (36.8 C) (Oral)  Resp 17  Wt 101 lb (45.813 kg)  BMI 18.47 kg/m2  SpO2  100%  Physical Exam  Nursing note and vitals reviewed. Constitutional: She is oriented to person, place, and time. She appears well-developed and well-nourished.  HENT:  Head: Normocephalic and atraumatic.  Eyes: Conjunctivae and EOM are normal.  Neck: Normal range of motion. Neck supple.  Cardiovascular: Regular rhythm.   Pulmonary/Chest: Effort normal and breath sounds normal.  Abdominal: Soft. Bowel sounds are normal. There is tenderness in the epigastric area.  Musculoskeletal: Normal range of motion.  Neurological: She is alert and oriented to person, place, and time.  Skin: Skin is warm and dry.  Psychiatric: She has a normal mood and affect.    ED Course  Procedures (including critical care time)  Labs Reviewed  CBC WITH DIFFERENTIAL - Abnormal; Notable for the following:    WBC 12.6 (*)    Hemoglobin 10.8 (*)    HCT 34.3 (*)    RDW 20.0 (*)    Platelets 479  (*)    Neutrophils Relative 81 (*)    Neutro Abs 10.2 (*)    Lymphocytes Relative 5 (*)    Lymphs Abs 0.6 (*)    Monocytes Relative 13 (*)    Monocytes Absolute 1.7 (*)    All other components within normal limits  COMPREHENSIVE METABOLIC PANEL - Abnormal; Notable for the following:    Potassium 3.1 (*)    Chloride 82 (*)    CO2 18 (*)    Glucose, Bld 123 (*)    Calcium 11.5 (*)    Total Protein 9.7 (*)    AST 127 (*)    ALT 59 (*)    Alkaline Phosphatase 215 (*)    GFR calc non Af Amer 60 (*)    GFR calc Af Amer 70 (*)    All other components within normal limits  LIPASE, BLOOD - Abnormal; Notable for the following:    Lipase 583 (*)    All other components within normal limits  TROPONIN I  URINALYSIS, ROUTINE W REFLEX MICROSCOPIC   No results found.  Date: 01/26/2013  Rate: 129  Rhythm: sinus tachycardia  QRS Axis: normal  Intervals: normal  ST/T Wave abnormalities: ST depressions inferiorly  Conduction Disutrbances:none  Narrative Interpretation:   Old EKG Reviewed: changes notedtachycardia and st depression    No diagnosis found.    MDM  Ct pending pt left with Earley Favor NP        Teressa Lower, NP 01/26/13 2018

## 2013-01-26 NOTE — ED Notes (Signed)
Writer informed pt that a urine sample was needed pt states that she is unable to to urinate at this time.

## 2013-01-26 NOTE — ED Notes (Signed)
Pt from home with reports of epigastric pain and N/V/D that started on Friday after endoscopy performed on Wednesday. Pt reports seeing GI today for same and was told to come to ED due to not being able to keep anything down for 3 days.

## 2013-01-26 NOTE — ED Notes (Signed)
[id number]<NW> Expected date:<BR> Expected time:<BR> Means of arrival:<BR> Comments:<BR> Triage 2

## 2013-01-26 NOTE — ED Provider Notes (Signed)
  Medical screening examination/treatment/procedure(s) were performed by non-physician practitioner and as supervising physician I was immediately available for consultation/collaboration.    Gerhard Munch, MD 01/26/13 2350

## 2013-01-26 NOTE — ED Provider Notes (Signed)
  Medical screening examination/treatment/procedure(s) were performed by non-physician practitioner and as supervising physician I was immediately available for consultation/collaboration.    Sheela Mcculley, MD 01/26/13 2350 

## 2013-01-27 ENCOUNTER — Encounter (HOSPITAL_COMMUNITY): Payer: Self-pay | Admitting: General Practice

## 2013-01-27 DIAGNOSIS — K859 Acute pancreatitis without necrosis or infection, unspecified: Secondary | ICD-10-CM

## 2013-01-27 DIAGNOSIS — E860 Dehydration: Secondary | ICD-10-CM

## 2013-01-27 LAB — COMPREHENSIVE METABOLIC PANEL
ALT: 39 U/L — ABNORMAL HIGH (ref 0–35)
AST: 75 U/L — ABNORMAL HIGH (ref 0–37)
Albumin: 3.7 g/dL (ref 3.5–5.2)
Alkaline Phosphatase: 145 U/L — ABNORMAL HIGH (ref 39–117)
BUN: 8 mg/dL (ref 6–23)
CO2: 17 mEq/L — ABNORMAL LOW (ref 19–32)
Calcium: 8.7 mg/dL (ref 8.4–10.5)
Chloride: 94 mEq/L — ABNORMAL LOW (ref 96–112)
Creatinine, Ser: 0.7 mg/dL (ref 0.50–1.10)
GFR calc Af Amer: >90 mL/min (ref >90)
GFR calc non Af Amer: >90 mL/min (ref >90)
Glucose, Bld: 84 mg/dL (ref 70–99)
Potassium: 2.5 mEq/L — CL (ref 3.5–5.1)
Sodium: 139 mEq/L (ref 135–145)
Total Bilirubin: 0.4 mg/dL (ref 0.3–1.2)
Total Protein: 7.2 g/dL (ref 6.0–8.3)

## 2013-01-27 LAB — LIPID PANEL
Cholesterol: 228 mg/dL — ABNORMAL HIGH (ref 0–200)
HDL: 102 mg/dL (ref >39)
LDL Cholesterol: 117 mg/dL — ABNORMAL HIGH (ref 0–99)
Total CHOL/HDL Ratio: 2.2 RATIO
Triglycerides: 47 mg/dL (ref <150)
VLDL: 9 mg/dL (ref 0–40)

## 2013-01-27 LAB — CBC
HCT: 26.4 % — ABNORMAL LOW (ref 36.0–46.0)
Hemoglobin: 8.4 g/dL — ABNORMAL LOW (ref 12.0–15.0)
MCH: 27.5 pg (ref 26.0–34.0)
MCHC: 31.8 g/dL (ref 30.0–36.0)
MCV: 86.3 fL (ref 78.0–100.0)
Platelets: 303 10*3/uL (ref 150–400)
RBC: 3.06 MIL/uL — ABNORMAL LOW (ref 3.87–5.11)
RDW: 20.4 % — ABNORMAL HIGH (ref 11.5–15.5)
WBC: 9.8 10*3/uL (ref 4.0–10.5)

## 2013-01-27 MED ORDER — POTASSIUM CHLORIDE CRYS ER 20 MEQ PO TBCR
40.0000 meq | EXTENDED_RELEASE_TABLET | ORAL | Status: AC
  Administered 2013-01-27 (×4): 40 meq via ORAL
  Filled 2013-01-27 (×4): qty 2

## 2013-01-27 MED ORDER — POTASSIUM CHLORIDE 10 MEQ/100ML IV SOLN
10.0000 meq | INTRAVENOUS | Status: AC
  Administered 2013-01-27: 10 meq via INTRAVENOUS
  Filled 2013-01-27 (×5): qty 100

## 2013-01-27 NOTE — Progress Notes (Signed)
Patient asleep, no further c/o burning, IV rate adjusted to 74ml/hr will titrate KCL runs as patient can tolerate.

## 2013-01-27 NOTE — Progress Notes (Signed)
Spoke to Dr. Benjamine Mola, states to discontinue IV KCL runs and will put in order for po potassium

## 2013-01-27 NOTE — Progress Notes (Signed)
Paged Dr. Benjamine Mola that patient had one KCL run but cannot tolerate IV runs, can we switch to po.

## 2013-01-27 NOTE — ED Notes (Signed)
Talked to Dr. Irene Limbo. He d/c'd cardiac monitoring on the phone.

## 2013-01-27 NOTE — Progress Notes (Signed)
[facility] PROGRESS NOTE  Holly Phelps ZOX:[id number] DOB: 1961/02/19 DOA: 01/26/2013 PCP: No primary provider on file.  Assessment/Plan: Nausea, vomiting, diarrhea: Likely secondary to esophagitis. No diarrhea. Low serum bicarbonate likely secondary to diarrhea. Favor a viral process. Appears clinically well, history does not suggest sepsis.   Dehydration: IV fluids.   Esophagitis: PPI. GI consultation in the morning for further recommendations. Patient denies NSAID use although Anaprox as listed as a medication. No evidence of perforation.   Status post colonoscopy, EGD 3/6: Results not available.- Dr. Elnoria Howard consulted  Elevated lipase: Denies abdominal pain, no epigastric pain. Lack of pain argues against acute pancreatitis. Secondary to recent procedure? Check fasting lipid panel.   Mixed LFT elevation: Chronic. Likely associated with known diffuse fatty infiltration of liver and alcohol abuse  Hypokalemia: Replete. Check Mg  Chronic alcohol use, suspect abuse: Monitor for withdrawal. CIWA.   Cigarette smoker: Recommend cessation   Code Status: full Family Communication: patient at bedside Disposition Plan: home 1-2 days   Consultants:  GI- hung- LM at office for consult as he was in a procedure  Procedures:    Antibiotics:    HPI/Subjective: No nausea, vomiting, diarrhea since 2 days ago Asking for food  Objective: Filed Vitals:   01/26/13 2330 01/26/13 2336 01/27/13 0021 01/27/13 0559  BP:  142/83 175/94 149/79  Pulse:   115 109  Temp:   97.9 F (36.6 C) 98.2 F (36.8 C)  TempSrc:   Oral Oral  Resp: 14 12  16   Height:   5\' 2"  (1.575 m)   Weight:   45.813 kg (101 lb)   SpO2:   100% 100%    Intake/Output Summary (Last 24 hours) at 01/27/13 1323 Last data filed at 01/27/13 1226  Gross per 24 hour  Intake 1308.33 ml  Output   1000 ml  Net 308.33 ml   Filed Weights   01/26/13 1651 01/27/13 0021  Weight: 45.813 kg (101 lb) 45.813 kg (101  lb)    Exam:   General:  Wakes up, NAD  Cardiovascular: rrr  Respiratory: clear, no wheezing  Abdomen: +BS, soft, NT  Musculoskeletal: no focal deficit  Data Reviewed: Basic Metabolic Panel:  Recent Labs Lab 01/26/13 1753 01/27/13 0410  NA 139 139  K 3.1* 2.5*  CL 82* 94*  CO2 18* 17*  GLUCOSE 123* 84  BUN 10 8  CREATININE 1.05 0.70  CALCIUM 11.5* 8.7   Liver Function Tests:  Recent Labs Lab 01/26/13 1753 01/27/13 0410  AST 127* 75*  ALT 59* 39*  ALKPHOS 215* 145*  BILITOT 0.7 0.4  PROT 9.7* 7.2  ALBUMIN 5.1 3.7    Recent Labs Lab 01/26/13 1753  LIPASE 583*   No results found for this basename: AMMONIA,  in the last 168 hours CBC:  Recent Labs Lab 01/26/13 1753 01/27/13 0410  WBC 12.6* 9.8  NEUTROABS 10.2*  --   HGB 10.8* 8.4*  HCT 34.3* 26.4*  MCV 87.1 86.3  PLT 479* 303   Cardiac Enzymes:  Recent Labs Lab 01/26/13 1753  TROPONINI <0.30   BNP (last 3 results) No results found for this basename: PROBNP,  in the last 8760 hours CBG: No results found for this basename: GLUCAP,  in the last 168 hours  No results found for this or any previous visit (from the past 240 hour(s)).   Studies: Dg Chest 2 View  01/26/2013  *RADIOLOGY REPORT*  Clinical Data: Chest pain.  CHEST - 2 VIEW  Comparison: 12/26/2012.  Findings: Normal sized heart.  Clear lungs.  Normal appearing bones.  IMPRESSION: Normal examination.   Original Report Authenticated By: Beckie Salts, M.D.    Ct Abdomen Pelvis W Contrast  01/26/2013  *RADIOLOGY REPORT*  Clinical Data: Epigastric abdominal pain, nausea, vomiting and diarrhea.  Status post endoscopy 5 days ago.  CT ABDOMEN AND PELVIS WITH CONTRAST  Technique:  Multidetector CT imaging of the abdomen and pelvis was performed following the standard protocol during bolus administration of intravenous contrast.  Contrast: OMNIPAQUE IOHEXOL 300 MG/ML  SOLN, 50mL OMNIPAQUE IOHEXOL 300 MG/ML  SOLN  Comparison: 10/18/2011.   Findings: Small sliding hiatal hernia.  Diffuse distal esophageal wall thickening with a maximum thickness of the 9 mm.  Diffuse low density of the liver relative to the spleen.  Better visualized small cyst in the midportion of the right kidney, measuring 4 mm in diameter on image number 18 of series 7. Unremarkable left kidney, ureters and urinary bladder.  No urinary tract calculi or hydronephrosis seen.  Unremarkable spleen, pancreas, gallbladder, adrenal glands, uterus and ovaries.  No intestinal abnormalities or enlarged lymph nodes. Atheromatous arterial calcifications without aneurysm or dissection.  Minimal dependent atelectasis at the right lung base. Minimal lumbar spine degenerative changes.  IMPRESSION:  1.  Moderate diffuse distal esophageal wall thickening, most likely due to esophagitis.  A concentric neoplasm is less likely. Correlation with the recent endoscopy findings is necessary. 2.  Small sliding hiatal hernia. 3.  Diffuse hepatic steatosis.   Original Report Authenticated By: Beckie Salts, M.D.     Scheduled Meds: . nicotine  7 mg Transdermal Daily  . pantoprazole (PROTONIX) IV  40 mg Intravenous Q12H  . potassium chloride  40 mEq Oral Q2H  . sucralfate  1 g Oral TID WC & HS  . thiamine  100 mg Oral Daily   Or  . thiamine  100 mg Intravenous Daily   Continuous Infusions: . sodium chloride 125 mL/hr at 01/27/13 1233    Principal Problem:   Nausea vomiting and diarrhea Active Problems:   ALCOHOL ABUSE   LIVER FUNCTION TESTS, ABNORMAL, HX OF   Dehydration   Esophagitis   Elevated lipase    Time spent: 25    [facility], Elesa Garman  [facility] Pager [phone]. If 7PM-7AM, please contact night-coverage at [email or web address], password [facility] 01/27/2013, 1:23 PM  LOS: 1 day

## 2013-01-27 NOTE — Care Management Note (Signed)
    Page 1 of 1   01/27/2013     11:31:10 AM   CARE MANAGEMENT NOTE 01/27/2013  Patient:  Holly Phelps, Holly Phelps   Account Number:  [id number]  Date Initiated:  01/27/2013  Documentation initiated by:  Lorenda Ishihara  Subjective/Objective Assessment:   52 yo female admitted with vomiting and diarrhea. PTA lived at home with spouse.     Action/Plan:   Home when stable   Anticipated DC Date:  01/27/2013   Anticipated DC Plan:  HOME/SELF CARE      DC Planning Services  CM consult      Choice offered to / List presented to:             Status of service:  Completed, signed off Medicare Important Message given?   (If response is "NO", the following Medicare IM given date fields will be blank) Date Medicare IM given:   Date Additional Medicare IM given:    Discharge Disposition:  HOME/SELF CARE  Per UR Regulation:  Reviewed for med. necessity/level of care/duration of stay  If discussed at Long Length of Stay Meetings, dates discussed:    Comments:

## 2013-01-27 NOTE — Consult Note (Signed)
Reason for Consult: Nausea/Vomiting, epigastric pain, and pancreatitis. Referring Physician: [facility].  Holly Phelps HPI: This is a 52 year old female with PMH of ETOH abuse who is well-known to me.  She was recently evaluated with an EGD and attempted colonoscopy last week for an anemia, but she did not prep.  Her EGD was negative for any overt pathology, but small bowel biopsies were obtained to evaluate for Celiac disease.  She started to have nausea, vomiting, and epigastric pain this past Saturday.  She was evaluated in the office yesterday for these symptoms and I encouraged her to go to the ER if she worsened.  Her blood work from the office was consistent with a pancreatitis and this correlated with the findings in the hospital.  The pancreatitis is secondary to her ETOH abuse.  No prior history of this type of pain.  Past Medical History  Diagnosis Date  . Allergy   . Anemia     Past Surgical History  Procedure Laterality Date  . Appendectomy    . Cervicall fusion      Family History  Problem Relation Age of Onset  . Stroke Father   . Stroke Brother     Social History:  reports that she has been smoking Cigarettes.  She has been smoking about 0.50 packs per day. She does not have any smokeless tobacco history on file. She reports that  drinks alcohol. She reports that she does not use illicit drugs.  Allergies:  Allergies  Allergen Reactions  . Codeine Nausea And Vomiting  . Sulfa Antibiotics     Pt does not recall what the reaction was,been too long    Medications:  Scheduled: . nicotine  7 mg Transdermal Daily  . pantoprazole (PROTONIX) IV  40 mg Intravenous Q12H  . sucralfate  1 g Oral TID WC & HS  . thiamine  100 mg Oral Daily   Or  . thiamine  100 mg Intravenous Daily   Continuous: . sodium chloride 125 mL/hr at 01/27/13 1233    Results for orders placed during the hospital encounter of 01/26/13 (from the past 24 hour(s))  URINALYSIS, ROUTINE  W REFLEX MICROSCOPIC     Status: Abnormal   Collection Time    01/26/13 10:42 PM      Result Value Range   Color, Urine YELLOW  YELLOW   APPearance CLEAR  CLEAR   Specific Gravity, Urine 1.046 (*) 1.005 - 1.030   pH 6.5  5.0 - 8.0   Glucose, UA NEGATIVE  NEGATIVE mg/dL   Hgb urine dipstick NEGATIVE  NEGATIVE   Bilirubin Urine NEGATIVE  NEGATIVE   Ketones, ur >80 (*) NEGATIVE mg/dL   Protein, ur 30 (*) NEGATIVE mg/dL   Urobilinogen, UA 0.2  0.0 - 1.0 mg/dL   Nitrite NEGATIVE  NEGATIVE   Leukocytes, UA MODERATE (*) NEGATIVE  URINE MICROSCOPIC-ADD ON     Status: Abnormal   Collection Time    01/26/13 10:42 PM      Result Value Range   Squamous Epithelial / LPF RARE  RARE   WBC, UA 11-20  <3 WBC/hpf   Casts HYALINE CASTS (*) NEGATIVE  COMPREHENSIVE METABOLIC PANEL     Status: Abnormal   Collection Time    01/27/13  4:10 AM      Result Value Range   Sodium 139  135 - 145 mEq/L   Potassium 2.5 (*) 3.5 - 5.1 mEq/L   Chloride 94 (*) 96 - 112 mEq/L  CO2 17 (*) 19 - 32 mEq/L   Glucose, Bld 84  70 - 99 mg/dL   BUN 8  6 - 23 mg/dL   Creatinine, Ser 8.29  0.50 - 1.10 mg/dL   Calcium 8.7  8.4 - 56.2 mg/dL   Total Protein 7.2  6.0 - 8.3 g/dL   Albumin 3.7  3.5 - 5.2 g/dL   AST 75 (*) 0 - 37 U/L   ALT 39 (*) 0 - 35 U/L   Alkaline Phosphatase 145 (*) 39 - 117 U/L   Total Bilirubin 0.4  0.3 - 1.2 mg/dL   GFR calc non Af Amer >90  >90 mL/min   GFR calc Af Amer >90  >90 mL/min  CBC     Status: Abnormal   Collection Time    01/27/13  4:10 AM      Result Value Range   WBC 9.8  4.0 - 10.5 K/uL   RBC 3.06 (*) 3.87 - 5.11 MIL/uL   Hemoglobin 8.4 (*) 12.0 - 15.0 g/dL   HCT 13.0 (*) 86.5 - 78.4 %   MCV 86.3  78.0 - 100.0 fL   MCH 27.5  26.0 - 34.0 pg   MCHC 31.8  30.0 - 36.0 g/dL   RDW 69.6 (*) 29.5 - 28.4 %   Platelets 303  150 - 400 K/uL  LIPID PANEL     Status: Abnormal   Collection Time    01/27/13  4:10 AM      Result Value Range   Cholesterol 228 (*) 0 - 200 mg/dL    Triglycerides 47  <132 mg/dL   HDL 440  >10 mg/dL   Total CHOL/HDL Ratio 2.2     VLDL 9  0 - 40 mg/dL   LDL Cholesterol 272 (*) 0 - 99 mg/dL     Dg Chest 2 View  5/36/6440  *RADIOLOGY REPORT*  Clinical Data: Chest pain.  CHEST - 2 VIEW  Comparison: 12/26/2012.  Findings: Normal sized heart.  Clear lungs.  Normal appearing bones.  IMPRESSION: Normal examination.   Original Report Authenticated By: Beckie Salts, M.D.    Ct Abdomen Pelvis W Contrast  01/26/2013  *RADIOLOGY REPORT*  Clinical Data: Epigastric abdominal pain, nausea, vomiting and diarrhea.  Status post endoscopy 5 days ago.  CT ABDOMEN AND PELVIS WITH CONTRAST  Technique:  Multidetector CT imaging of the abdomen and pelvis was performed following the standard protocol during bolus administration of intravenous contrast.  Contrast: OMNIPAQUE IOHEXOL 300 MG/ML  SOLN, 50mL OMNIPAQUE IOHEXOL 300 MG/ML  SOLN  Comparison: 10/18/2011.  Findings: Small sliding hiatal hernia.  Diffuse distal esophageal wall thickening with a maximum thickness of the 9 mm.  Diffuse low density of the liver relative to the spleen.  Better visualized small cyst in the midportion of the right kidney, measuring 4 mm in diameter on image number 18 of series 7. Unremarkable left kidney, ureters and urinary bladder.  No urinary tract calculi or hydronephrosis seen.  Unremarkable spleen, pancreas, gallbladder, adrenal glands, uterus and ovaries.  No intestinal abnormalities or enlarged lymph nodes. Atheromatous arterial calcifications without aneurysm or dissection.  Minimal dependent atelectasis at the right lung base. Minimal lumbar spine degenerative changes.  IMPRESSION:  1.  Moderate diffuse distal esophageal wall thickening, most likely due to esophagitis.  A concentric neoplasm is less likely. Correlation with the recent endoscopy findings is necessary. 2.  Small sliding hiatal hernia. 3.  Diffuse hepatic steatosis.   Original Report Authenticated By: Beckie Salts,  M.D.     ROS:  As stated above in the HPI otherwise negative.  Blood pressure 147/84, pulse 83, temperature 98.7 F (37.1 C), temperature source Oral, resp. rate 18, height 5\' 2"  (1.575 m), weight 101 lb (45.813 kg), SpO2 100.00%.    PE: Gen: NAD, Alert and Oriented HEENT:  [place]/AT, EOMI Neck: Supple, no LAD Lungs: CTA Bilaterally CV: RRR without M/G/R ABM: Soft, NTND, +BS Ext: No C/C/E  Assessment/Plan: 1) ETOH panreatitis. 2) ETOH abuse.   I had a long talk with the patient about her ETOH abuse.  She needs to get help.  Clinically she appears better and she denies any abdominal pain at this time.  Plan: 1) Continue with supportive care. 2) Advance diet as tolerated. 3) Monitor for any signs of ETOH withdrawal.   Denys Salinger D 01/27/2013, 6:26 PM

## 2013-01-27 NOTE — Progress Notes (Signed)
CRITICAL VALUE ALERT  Critical value received:  Potassium  Date of notification:  01/27/13  Time of notification:  0525  Critical value read back:yes  Nurse who received alert:  Armando Reichert  MD notified (1st page):  Kirtland Bouchard Schorr  Time of first page:  0531  MD notified (2nd page):  Time of second page:  Responding MD:  Merdis Delay   Time MD responded:  [phone]

## 2013-01-27 NOTE — Progress Notes (Signed)
Patient in room screaming and moaning out in pain that IV site burning due to KCL run infusing. Rate turned down to 53ml/hr but continues to burn patient. Blood return noted at site and flushed, rate turned down to 45ml/hr for patient to tolerate. Ice pack applied will continue to monitor.

## 2013-01-28 LAB — COMPREHENSIVE METABOLIC PANEL
ALT: 30 U/L (ref 0–35)
AST: 66 U/L — ABNORMAL HIGH (ref 0–37)
Albumin: 3.4 g/dL — ABNORMAL LOW (ref 3.5–5.2)
Alkaline Phosphatase: 137 U/L — ABNORMAL HIGH (ref 39–117)
BUN: <3 mg/dL — ABNORMAL LOW (ref 6–23)
CO2: 19 mEq/L (ref 19–32)
Calcium: 8.3 mg/dL — ABNORMAL LOW (ref 8.4–10.5)
Chloride: 94 mEq/L — ABNORMAL LOW (ref 96–112)
Creatinine, Ser: 0.5 mg/dL (ref 0.50–1.10)
GFR calc Af Amer: >90 mL/min (ref >90)
GFR calc non Af Amer: >90 mL/min (ref >90)
Glucose, Bld: 77 mg/dL (ref 70–99)
Potassium: 3.3 mEq/L — ABNORMAL LOW (ref 3.5–5.1)
Sodium: 133 mEq/L — ABNORMAL LOW (ref 135–145)
Total Bilirubin: 0.4 mg/dL (ref 0.3–1.2)
Total Protein: 6.8 g/dL (ref 6.0–8.3)

## 2013-01-28 LAB — LIPASE, BLOOD: Lipase: 444 U/L — ABNORMAL HIGH (ref 11–59)

## 2013-01-28 LAB — CBC
HCT: 26.2 % — ABNORMAL LOW (ref 36.0–46.0)
Hemoglobin: 8.2 g/dL — ABNORMAL LOW (ref 12.0–15.0)
MCH: 27.1 pg (ref 26.0–34.0)
MCHC: 31.3 g/dL (ref 30.0–36.0)
MCV: 86.5 fL (ref 78.0–100.0)
Platelets: 292 10*3/uL (ref 150–400)
RBC: 3.03 MIL/uL — ABNORMAL LOW (ref 3.87–5.11)
RDW: 19.8 % — ABNORMAL HIGH (ref 11.5–15.5)
WBC: 7.4 10*3/uL (ref 4.0–10.5)

## 2013-01-28 LAB — MAGNESIUM: Magnesium: 0.8 mg/dL — CL (ref 1.5–2.5)

## 2013-01-28 MED ORDER — MAGNESIUM SULFATE 40 MG/ML IJ SOLN
4.0000 g | Freq: Once | INTRAMUSCULAR | Status: AC
  Administered 2013-01-28: 4 g via INTRAVENOUS
  Filled 2013-01-28: qty 100

## 2013-01-28 MED ORDER — PANTOPRAZOLE SODIUM 40 MG PO TBEC
40.0000 mg | DELAYED_RELEASE_TABLET | Freq: Two times a day (BID) | ORAL | Status: DC
  Administered 2013-01-28 – 2013-01-29 (×2): 40 mg via ORAL
  Filled 2013-01-28 (×3): qty 1

## 2013-01-28 MED ORDER — ADULT MULTIVITAMIN W/MINERALS CH
1.0000 | ORAL_TABLET | Freq: Every day | ORAL | Status: DC
  Administered 2013-01-28 – 2013-01-29 (×2): 1 via ORAL
  Filled 2013-01-28 (×2): qty 1

## 2013-01-28 MED ORDER — POTASSIUM CHLORIDE CRYS ER 20 MEQ PO TBCR
40.0000 meq | EXTENDED_RELEASE_TABLET | Freq: Once | ORAL | Status: AC
  Administered 2013-01-28: 40 meq via ORAL
  Filled 2013-01-28: qty 2

## 2013-01-28 NOTE — Progress Notes (Addendum)
[facility] PROGRESS NOTE  Holly Phelps:[id number] DOB: February 28, 1961 DOA: 01/26/2013 PCP: No primary provider on file.  Assessment/Plan: Nausea vomiting and diarrhea Likely secondary to esophagitis as seen on CT scan however given her history of active alcohol use and elevated lipase she possibly has underlying acute pancreatitis as well. Lipase is trending down. Continue IV hydration. Symptoms much improved now. Tolerating clear liquid diet. We'll advance her to regular and monitor.  Esophagitis As seen on CT scan possibly in the setting of recurrent vomiting. Continue PPI Recent EGD unremarkable.  Alcohol abuse Likely has acute pancreatitis secondary to this and still has least 2 drinks daily. She'll strongly on alcohol cessation and promises to quit drinking upon discharge. Social work consulted to provide resources in the community.  Hypokalemia/hypomagnesemia Replenished  Anemia Recent iron panel normal. Recent EGD unremarkable. We'll check TSH and B12 level.   Code Status: Full code Family Communication: None at bedside Disposition Plan: Home likely tomorrow if tolerating advanced diet.   Consultants:  Dr. Elnoria Howard  Procedures:  None  Antibiotics:  None  HPI/Subjective: Denies any abdominal pain, nausea or vomiting today.  Objective: Filed Vitals:   01/27/13 1400 01/27/13 2109 01/28/13 0558 01/28/13 1400  BP: 147/84 135/79 145/84 140/86  Pulse: 83 102 103 95  Temp: 98.7 F (37.1 C) 99.1 F (37.3 C) 98.7 F (37.1 C) 99.3 F (37.4 C)  TempSrc: Oral Oral Oral Oral  Resp: 18 16 18 18   Height:      Weight:      SpO2: 100% 100% 100% 92%    Intake/Output Summary (Last 24 hours) at 01/28/13 1603 Last data filed at 01/28/13 1400  Gross per 24 hour  Intake   2740 ml  Output   3000 ml  Net   -260 ml   Filed Weights   01/26/13 1651 01/27/13 0021  Weight: 45.813 kg (101 lb) 45.813 kg (101 lb)    Exam:   General: Middle aged female in no acute  distress  HEENT: No pallor, moist oral mucosa  Cardiovascular: Normal S1 and S2, no murmurs rub or gallop  Respiratory: Clear to auscultation bilaterally no added sounds  Abdomen: Soft, nontender, nondistended, bowel sounds present  Musculoskeletal: Warm, no edema  Illness: AAO x3, nonfocal, no tremors  Data Reviewed: Basic Metabolic Panel:  Recent Labs Lab 01/26/13 1753 01/27/13 0410 01/28/13 0405  NA 139 139 133*  K 3.1* 2.5* 3.3*  CL 82* 94* 94*  CO2 18* 17* 19  GLUCOSE 123* 84 77  BUN 10 8 <3*  CREATININE 1.05 0.70 0.50  CALCIUM 11.5* 8.7 8.3*  MG  --   --  0.8*   Liver Function Tests:  Recent Labs Lab 01/26/13 1753 01/27/13 0410 01/28/13 0405  AST 127* 75* 66*  ALT 59* 39* 30  ALKPHOS 215* 145* 137*  BILITOT 0.7 0.4 0.4  PROT 9.7* 7.2 6.8  ALBUMIN 5.1 3.7 3.4*    Recent Labs Lab 01/26/13 1753 01/28/13 0405  LIPASE 583* 444*   No results found for this basename: AMMONIA,  in the last 168 hours CBC:  Recent Labs Lab 01/26/13 1753 01/27/13 0410 01/28/13 0405  WBC 12.6* 9.8 7.4  NEUTROABS 10.2*  --   --   HGB 10.8* 8.4* 8.2*  HCT 34.3* 26.4* 26.2*  MCV 87.1 86.3 86.5  PLT 479* 303 292   Cardiac Enzymes:  Recent Labs Lab 01/26/13 1753  TROPONINI <0.30   BNP (last 3 results) No results found for this basename: PROBNP,  in the last 8760 hours CBG: No results found for this basename: GLUCAP,  in the last 168 hours  No results found for this or any previous visit (from the past 240 hour(s)).   Studies: Dg Chest 2 View  01/26/2013  *RADIOLOGY REPORT*  Clinical Data: Chest pain.  CHEST - 2 VIEW  Comparison: 12/26/2012.  Findings: Normal sized heart.  Clear lungs.  Normal appearing bones.  IMPRESSION: Normal examination.   Original Report Authenticated By: Beckie Salts, M.D.    Ct Abdomen Pelvis W Contrast  01/26/2013  *RADIOLOGY REPORT*  Clinical Data: Epigastric abdominal pain, nausea, vomiting and diarrhea.  Status post endoscopy 5  days ago.  CT ABDOMEN AND PELVIS WITH CONTRAST  Technique:  Multidetector CT imaging of the abdomen and pelvis was performed following the standard protocol during bolus administration of intravenous contrast.  Contrast: OMNIPAQUE IOHEXOL 300 MG/ML  SOLN, 50mL OMNIPAQUE IOHEXOL 300 MG/ML  SOLN  Comparison: 10/18/2011.  Findings: Small sliding hiatal hernia.  Diffuse distal esophageal wall thickening with a maximum thickness of the 9 mm.  Diffuse low density of the liver relative to the spleen.  Better visualized small cyst in the midportion of the right kidney, measuring 4 mm in diameter on image number 18 of series 7. Unremarkable left kidney, ureters and urinary bladder.  No urinary tract calculi or hydronephrosis seen.  Unremarkable spleen, pancreas, gallbladder, adrenal glands, uterus and ovaries.  No intestinal abnormalities or enlarged lymph nodes. Atheromatous arterial calcifications without aneurysm or dissection.  Minimal dependent atelectasis at the right lung base. Minimal lumbar spine degenerative changes.  IMPRESSION:  1.  Moderate diffuse distal esophageal wall thickening, most likely due to esophagitis.  A concentric neoplasm is less likely. Correlation with the recent endoscopy findings is necessary. 2.  Small sliding hiatal hernia. 3.  Diffuse hepatic steatosis.   Original Report Authenticated By: Beckie Salts, M.D.     Scheduled Meds: . nicotine  7 mg Transdermal Daily  . pantoprazole (PROTONIX) IV  40 mg Intravenous Q12H  . sucralfate  1 g Oral TID WC & HS  . thiamine  100 mg Oral Daily   Or  . thiamine  100 mg Intravenous Daily   Continuous Infusions: . sodium chloride 125 mL/hr at 01/28/13 1438      Time spent: 25 minutes    Dwanda Tufano  [facility] Pager [phone] If 7PM-7AM, please contact night-coverage at [email or web address], password [facility] 01/28/2013, 4:03 PM  LOS: 2 days

## 2013-01-28 NOTE — Progress Notes (Signed)
Subjective: Patient seems to be doing somewhat better today. Her abdominal pain, nausea and vomiting have resolved. She is anticipating discharge in AM.   Objective: Vital signs in last 24 hours: Temp:  [98.7 F (37.1 C)-99.3 F (37.4 C)] 99.3 F (37.4 C) (03/12 1400) Pulse Rate:  [95-103] 95 (03/12 1400) Resp:  [16-18] 18 (03/12 1400) BP: (135-145)/(79-86) 140/86 mmHg (03/12 1400) SpO2:  [92 %-100 %] 92 % (03/12 1400) Last BM Date: 01/25/13  Intake/Output from previous day: 03/11 0701 - 03/12 0700 In: 3808.3 [P.O.:300; I.V.:3508.3] Out: 2700 [Urine:2700] Intake/Output this shift:   GPE Alert, oriented x 3 in NAD Chest: CTA S1 and S2 regular.  Abdomen: Soft NTNABS.    Lab Results:  Recent Labs  01/26/13 1753 01/27/13 0410 01/28/13 0405  WBC 12.6* 9.8 7.4  HGB 10.8* 8.4* 8.2*  HCT 34.3* 26.4* 26.2*  PLT 479* 303 292   BMET  Recent Labs  01/26/13 1753 01/27/13 0410 01/28/13 0405  NA 139 139 133*  K 3.1* 2.5* 3.3*  CL 82* 94* 94*  CO2 18* 17* 19  GLUCOSE 123* 84 77  BUN 10 8 <3*  CREATININE 1.05 0.70 0.50  CALCIUM 11.5* 8.7 8.3*   LFT  Recent Labs  01/28/13 0405  PROT 6.8  ALBUMIN 3.4*  AST 66*  ALT 30  ALKPHOS 137*  BILITOT 0.4   Studies/Results: Dg Chest 2 View  01/26/2013  *RADIOLOGY REPORT*  Clinical Data: Chest pain.  CHEST - 2 VIEW  Comparison: 12/26/2012.  Findings: Normal sized heart.  Clear lungs.  Normal appearing bones.  IMPRESSION: Normal examination.   Original Report Authenticated By: Beckie Salts, M.D.    Ct Abdomen Pelvis W Contrast  01/26/2013  *RADIOLOGY REPORT*  Clinical Data: Epigastric abdominal pain, nausea, vomiting and diarrhea.  Status post endoscopy 5 days ago.  CT ABDOMEN AND PELVIS WITH CONTRAST  Technique:  Multidetector CT imaging of the abdomen and pelvis was performed following the standard protocol during bolus administration of intravenous contrast.  Contrast: OMNIPAQUE IOHEXOL 300 MG/ML  SOLN, 50mL OMNIPAQUE  IOHEXOL 300 MG/ML  SOLN  Comparison: 10/18/2011.  Findings: Small sliding hiatal hernia.  Diffuse distal esophageal wall thickening with a maximum thickness of the 9 mm.  Diffuse low density of the liver relative to the spleen.  Better visualized small cyst in the midportion of the right kidney, measuring 4 mm in diameter on image number 18 of series 7. Unremarkable left kidney, ureters and urinary bladder.  No urinary tract calculi or hydronephrosis seen.  Unremarkable spleen, pancreas, gallbladder, adrenal glands, uterus and ovaries.  No intestinal abnormalities or enlarged lymph nodes. Atheromatous arterial calcifications without aneurysm or dissection.  Minimal dependent atelectasis at the right lung base. Minimal lumbar spine degenerative changes.  IMPRESSION:  1.  Moderate diffuse distal esophageal wall thickening, most likely due to esophagitis.  A concentric neoplasm is less likely. Correlation with the recent endoscopy findings is necessary. 2.  Small sliding hiatal hernia. 3.  Diffuse hepatic steatosis.   Original Report Authenticated By: Beckie Salts, M.D.     Medications: I have reviewed the patient's current medications.  Assessment/Plan: 1) Esophagitis/GERD: Improved. Continue PPI's on discharge.  2) Alcohol abuse with pancreatitis: patient has had a long discussion with Dr. Elnoria Howard yesterday about abstention from alcohol use.  3) Diarrhea: improved; probably secondary to pancreatic insufficiency. She will benefit from pancreatic enzyme supplements.  LOS: 2 days   Emani Morad 01/28/2013, 7:10 PM

## 2013-01-28 NOTE — Progress Notes (Signed)
INITIAL NUTRITION ASSESSMENT  Pt meets criteria for severe MALNUTRITION in the context of chronic illness as evidenced by <75% estimated energy intake in the past month in addition to pt with visible severe muscle wasting and subcutaneous fat loss in upper arm and leg regions.  DOCUMENTATION CODES Per approved criteria  -Severe malnutrition in the context of chronic illness -Underweight   INTERVENTION: - Multivitamin 1 tablet PO daily - Educated pt on low fat diet for pancreatitis - Will continue to monitor   NUTRITION DIAGNOSIS: Altered GI function related to alcohol related pancreatitis as evidenced by GI notes.   Goal: Pt to consume >90% of meals.   Monitor:  Weights, labs, intake  Reason for Assessment: Nutrition risk   52 y.o. female  Admitting Dx: Nausea vomiting and diarrhea  ASSESSMENT: Pt admitted with 4 day history of nausea/vomiting/diarrhea. Pt with chronic alcohol use PTA. Pt getting thiamin. Met with pt who reports she could not eat/drink during those 4 days. Pt reports before then she was eating 2 meals/day and snacks. Pt reports losing 5 pounds in the past week. Pt reports nausea/vomiting/diarrhea have resolved. Pt only able to eat a bite of her cheeseburger for lunch. Encouraged low fat choices instead. Pt denies wanting any nutritional supplements at this time. Performed nutrition focused physical exam.   Nutrition Focused Physical Exam:  Subcutaneous Fat:  Orbital Region: WNL Upper Arm Region: severe wasting Thoracic and Lumbar Region: N/A  Muscle:  Temple Region: WNL Clavicle Bone Region: N/A Clavicle and Acromion Bone Region: N/A Scapular Bone Region: N/A Dorsal Hand: severe wasting Patellar Region: severe wasting Anterior Thigh Region: severe wasting Posterior Calf Region: severe wasting  Edema: None noted   Height: Ht Readings from Last 1 Encounters:  01/27/13 5\' 2"  (1.575 m)    Weight: Wt Readings from Last 1 Encounters:  01/27/13 101  lb (45.813 kg)    Ideal Body Weight: 110 lb  % Ideal Body Weight: 92  Wt Readings from Last 10 Encounters:  01/27/13 101 lb (45.813 kg)  01/13/13 106 lb (48.081 kg)  12/26/12 102 lb (46.267 kg)  12/13/12 104 lb 9.6 oz (47.446 kg)    Usual Body Weight: 106 lb in past week per pt report  % Usual Body Weight: 95  BMI:  Body mass index is 18.47 kg/(m^2). Underweight  Estimated Nutritional Needs: Kcal: 1400-1600 Protein: 70g Fluid: 1.4-1.6L/day  Skin: Intact  Diet Order: General  EDUCATION NEEDS: -Education needs addressed - discussed low fat diet for possible pancreatitis and encouraged pt to take daily multivitamin   Intake/Output Summary (Last 24 hours) at 01/28/13 1907 Last data filed at 01/28/13 1800  Gross per 24 hour  Intake 3015.42 ml  Output   2200 ml  Net 815.42 ml    Last BM: 3/9  Labs:   Recent Labs Lab 01/26/13 1753 01/27/13 0410 01/28/13 0405  NA 139 139 133*  K 3.1* 2.5* 3.3*  CL 82* 94* 94*  CO2 18* 17* 19  BUN 10 8 <3*  CREATININE 1.05 0.70 0.50  CALCIUM 11.5* 8.7 8.3*  MG  --   --  0.8*  GLUCOSE 123* 84 77    CBG (last 3)  No results found for this basename: GLUCAP,  in the last 72 hours  Scheduled Meds: . nicotine  7 mg Transdermal Daily  . pantoprazole  40 mg Oral BID  . sucralfate  1 g Oral TID WC & HS  . thiamine  100 mg Oral Daily   Or  .  thiamine  100 mg Intravenous Daily    Continuous Infusions:   Past Medical History  Diagnosis Date  . Allergy   . Anemia     Past Surgical History  Procedure Laterality Date  . Appendectomy    . Cervicall fusion       Levon Hedger MS, RD, [place] [phone] Pager [phone] After Hours Pager

## 2013-01-28 NOTE — Progress Notes (Signed)
CRITICAL VALUE ALERT  Critical value received:  Magnesium of 0.8  Date of notification:  01/28/2013  Time of notification:  0520  Critical value read back:yes  Nurse who received alert:  Pilar Plate, RN  MD notified (1st page):  Kirtland Bouchard Schorr  Time of first page:  0525  MD notified (2nd page):  Time of second page:  Responding MD:  Merdis Delay  Time MD responded:  [phone]

## 2013-01-29 DIAGNOSIS — D649 Anemia, unspecified: Secondary | ICD-10-CM | POA: Diagnosis present

## 2013-01-29 DIAGNOSIS — K859 Acute pancreatitis without necrosis or infection, unspecified: Secondary | ICD-10-CM | POA: Diagnosis present

## 2013-01-29 DIAGNOSIS — E871 Hypo-osmolality and hyponatremia: Secondary | ICD-10-CM | POA: Diagnosis present

## 2013-01-29 DIAGNOSIS — E876 Hypokalemia: Secondary | ICD-10-CM

## 2013-01-29 LAB — GLUCOSE, CAPILLARY: Glucose-Capillary: 123 mg/dL — ABNORMAL HIGH (ref 70–99)

## 2013-01-29 LAB — BASIC METABOLIC PANEL
BUN: <3 mg/dL — ABNORMAL LOW (ref 6–23)
BUN: <3 mg/dL — ABNORMAL LOW (ref 6–23)
CO2: 24 mEq/L (ref 19–32)
CO2: 25 mEq/L (ref 19–32)
Calcium: 8.7 mg/dL (ref 8.4–10.5)
Calcium: 8.7 mg/dL (ref 8.4–10.5)
Chloride: 88 mEq/L — ABNORMAL LOW (ref 96–112)
Chloride: 90 mEq/L — ABNORMAL LOW (ref 96–112)
Creatinine, Ser: 0.42 mg/dL — ABNORMAL LOW (ref 0.50–1.10)
Creatinine, Ser: 0.45 mg/dL — ABNORMAL LOW (ref 0.50–1.10)
GFR calc Af Amer: >90 mL/min (ref >90)
GFR calc Af Amer: >90 mL/min (ref >90)
GFR calc non Af Amer: >90 mL/min (ref >90)
GFR calc non Af Amer: >90 mL/min (ref >90)
Glucose, Bld: 89 mg/dL (ref 70–99)
Glucose, Bld: 106 mg/dL — ABNORMAL HIGH (ref 70–99)
Potassium: 2.6 mEq/L — CL (ref 3.5–5.1)
Potassium: 3.5 mEq/L (ref 3.5–5.1)
Sodium: 129 mEq/L — ABNORMAL LOW (ref 135–145)
Sodium: 129 mEq/L — ABNORMAL LOW (ref 135–145)

## 2013-01-29 LAB — VITAMIN B12: Vitamin B-12: 636 pg/mL (ref 211–911)

## 2013-01-29 LAB — MAGNESIUM: Magnesium: 1.6 mg/dL (ref 1.5–2.5)

## 2013-01-29 LAB — TSH: TSH: 6.076 u[IU]/mL — ABNORMAL HIGH (ref 0.350–4.500)

## 2013-01-29 LAB — LIPASE, BLOOD: Lipase: 508 U/L — ABNORMAL HIGH (ref 11–59)

## 2013-01-29 LAB — T4, FREE: Free T4: 1.01 ng/dL (ref 0.80–1.80)

## 2013-01-29 MED ORDER — PANCRELIPASE (LIP-PROT-AMYL) 12000-38000 UNITS PO CPEP
1.0000 | ORAL_CAPSULE | Freq: Three times a day (TID) | ORAL | Status: DC
  Administered 2013-01-29 (×3): 1 via ORAL
  Filled 2013-01-29 (×4): qty 1

## 2013-01-29 MED ORDER — SUCRALFATE 1 GM/10ML PO SUSP: 1.0000 g | Freq: Three times a day (TID) | ORAL | Status: DC

## 2013-01-29 MED ORDER — MAGNESIUM SULFATE 40 MG/ML IJ SOLN
2.0000 g | Freq: Once | INTRAMUSCULAR | Status: AC
  Administered 2013-01-29: 2 g via INTRAVENOUS
  Filled 2013-01-29: qty 50

## 2013-01-29 MED ORDER — PANCRELIPASE (LIP-PROT-AMYL) 12000-38000 UNITS PO CPEP: 1.0000 | ORAL_CAPSULE | Freq: Three times a day (TID) | ORAL | Status: DC

## 2013-01-29 MED ORDER — ADULT MULTIVITAMIN W/MINERALS CH: 1.0000 | ORAL_TABLET | Freq: Every day | ORAL | Status: DC

## 2013-01-29 MED ORDER — POTASSIUM CHLORIDE 20 MEQ/15ML (10%) PO LIQD
40.0000 meq | Freq: Once | ORAL | Status: AC
  Administered 2013-01-29: 40 meq via ORAL
  Filled 2013-01-29: qty 30

## 2013-01-29 MED ORDER — SODIUM CHLORIDE 0.9 % IV SOLN: INTRAVENOUS | Status: DC

## 2013-01-29 MED ORDER — POTASSIUM CHLORIDE CRYS ER 20 MEQ PO TBCR
30.0000 meq | EXTENDED_RELEASE_TABLET | Freq: Once | ORAL | Status: AC
  Administered 2013-01-29: 30 meq via ORAL
  Filled 2013-01-29: qty 1

## 2013-01-29 MED ORDER — THIAMINE HCL 100 MG PO TABS: 100.0000 mg | ORAL_TABLET | Freq: Every day | ORAL | Status: DC

## 2013-01-29 NOTE — Discharge Summary (Signed)
Physician Discharge Summary  Holly Phelps WUJ:[id number] DOB: 1961-08-24 DOA: 01/26/2013  PCP: follows at [facility]  Admit date: 01/26/2013 Discharge date: 01/29/2013  Time spent 40 minutes  Recommendations for Outpatient Follow-up:  Home with outpatient follow up at [facility]. Follow free T4 level and restart on synthroid Check electrolytes during follow up  -f/up with Dr Elnoria Howard in 2 weeks. ( office will call for appt)   Discharge Diagnoses:  Principal Problem:  acute Etoh  Pancreatitis  Active Problems:    Etoh abuse   Nausea vomiting and diarrhea   Dehydration   Esophagitis   Elevated lipase   Hyponatremia   Anemia hypothyroidism   Discharge Condition: fair  Diet recommendation: regular  Filed Weights   01/26/13 1651 01/27/13 0021  Weight: 45.813 kg (101 lb) 45.813 kg (101 lb)    History of present illness:  Please refer to admission H&P for details but in brief,   Hospital Course:  Acute etoh pancreatitis Patient has active etoh use and presented with markedly elevated lipase. Symptoms of abdominal pain, nausea and vomiting improved with IV hydration and pain control. Symptoms now resolved. Lipase slowly improving.   counseled strongly on etoh cessation. continue thiamine, MV. Will discharge on pancreatic enzymes. Continue PPI.  -Patient refused for any resources offered  for etoh abuse.  Esophagitis  As seen on CT scan possibly in the setting of recurrent vomiting. Continue PPI  Recent EGD unremarkable.   Alcohol abuse  Likely has acute pancreatitis secondary to this and still has least 2 drinks daily. She'll strongly on alcohol cessation and promises to quit drinking upon discharge.   Hypokalemia/hypomagnesemia  Replenished    Hyponatremia hydrated.  still low at 129. Check as outpatient.  Anemia  Recent iron panel normal. Recent EGD unremarkable. B12 wnl. Elevated TSH noted.  Hypothyroidism  patient has hx of  hypothyroidism but informs being taken off meds in past. TSh elevated at 6. Free t4 ordered. Follow up as outpatient.   Code Status: Full code   Procedures:  none  Consultations:  Dr Elnoria Howard ( GI)  Discharge Exam: Filed Vitals:   01/28/13 1400 01/28/13 2100 01/29/13 0515 01/29/13 1409  BP: 140/86 127/83 149/76 139/90  Pulse: 95 100 104 97  Temp: 99.3 F (37.4 C) 99.1 F (37.3 C) 98.2 F (36.8 C) 98.1 F (36.7 C)  TempSrc: Oral Oral Oral Oral  Resp: 18 18 18 18   Height:      Weight:      SpO2: 92% 100% 100% 100%     General: Middle aged female in no acute distress  HEENT: No pallor, moist oral mucosa  Cardiovascular: Normal S1 and S2, no murmurs rub or gallop  Respiratory: Clear to auscultation bilaterally no added sounds  Abdomen: Soft, nontender, nondistended, bowel sounds present  Musculoskeletal: Warm, no edema  Illness: AAO x3, nonfocal, no tremors  Discharge Instructions     Medication List    STOP taking these medications       naproxen sodium 550 MG tablet  Commonly known as:  ANAPROX DS      TAKE these medications       cyclobenzaprine 10 MG tablet  Commonly known as:  FLEXERIL  Take 1 tablet (10 mg total) by mouth 3 (three) times daily as needed for muscle spasms.     esomeprazole 40 MG capsule  Commonly known as:  NEXIUM  Take 40 mg by mouth daily before breakfast.     ferrous  sulfate 325 (65 FE) MG tablet  Take 1 tablet (325 mg total) by mouth daily with breakfast.     lipase/protease/amylase 16109 UNITS Cpep  Commonly known as:  CREON-10/PANCREASE  Take 1 capsule by mouth 3 (three) times daily before meals.     multivitamin with minerals Tabs  Take 1 tablet by mouth daily.     sucralfate 1 GM/10ML suspension  Commonly known as:  CARAFATE  Take 10 mLs (1 g total) by mouth 4 (four) times daily -  with meals and at bedtime.     thiamine 100 MG tablet  Take 1 tablet (100 mg total) by mouth daily.           Follow-up Information    Follow up with HUNG,PATRICK D, MD In 2 weeks. (office will call for appointment)    Contact information:   [address] [place] [address] [phone]       [facility] with [facility] Schedule an appointment as soon as possible for a visit in 1 week.   Contact information:   [address] [place] [address] [phone]       The results of significant diagnostics from this hospitalization (including imaging, microbiology, ancillary and laboratory) are listed below for reference.    Significant Diagnostic Studies: Dg Chest 2 View  01/26/2013  *RADIOLOGY REPORT*  Clinical Data: Chest pain.  CHEST - 2 VIEW  Comparison: 12/26/2012.  Findings: Normal sized heart.  Clear lungs.  Normal appearing bones.  IMPRESSION: Normal examination.   Original Report Authenticated By: Beckie Salts, M.D.    Dg Cervical Spine Complete  01/13/2013  *RADIOLOGY REPORT*  Clinical Data: 52 year old female with neck pain.  CERVICAL SPINE - COMPLETE 4+ VIEW  Comparison: [facility] cervical spine radiograph 05/07/2010.  Findings: C3-C4 and C4-C5 ACDF hardware re-identified and appears stable in configuration.  Previous C5-C6 ACDF with no C6 level hardware.  C6-C7 disc space narrowing and endplate spurring.  Mild anterolisthesis of C2 on C3 is stable.  Prevertebral soft tissue contours are stable within normal limits.  AP alignment within normal limits.  Lung apices are stable with mild calcified scarring or great vessel atherosclerosis.  There is cervical carotid calcified atherosclerosis re-identified.  On today's left oblique view there is a fracture through the left C4 screw evident.  The other levels appear intact on these views. Bilateral posterior element alignment is within normal limits.  IMPRESSION: 1.  Stable postoperative appearance of the cervical spine except for evidence of left C4 cortical screw fracture (see oblique view). 2.  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  Stable mild anterolisthesis at C2-C3.   Original Report Authenticated By: Erskine Speed, M.D.    Ct Abdomen Pelvis W Contrast  01/26/2013  *RADIOLOGY REPORT*  Clinical Data: Epigastric abdominal pain, nausea, vomiting and diarrhea.  Status post endoscopy 5 days ago.  CT ABDOMEN AND PELVIS WITH CONTRAST  Technique:  Multidetector CT imaging of the abdomen and pelvis was performed following the standard protocol during bolus administration of intravenous contrast.  Contrast: OMNIPAQUE IOHEXOL 300 MG/ML  SOLN, 50mL OMNIPAQUE IOHEXOL 300 MG/ML  SOLN  Comparison: 10/18/2011.  Findings: Small sliding hiatal hernia.  Diffuse distal esophageal wall thickening with a maximum thickness of the 9 mm.  Diffuse low density of the liver relative to the spleen.  Better visualized small cyst in the midportion of the right kidney, measuring 4 mm in diameter on image number 18 of series 7.  Unremarkable left kidney, ureters and urinary bladder.  No urinary tract calculi or hydronephrosis seen.  Unremarkable spleen, pancreas, gallbladder, adrenal glands, uterus and ovaries.  No intestinal abnormalities or enlarged lymph nodes. Atheromatous arterial calcifications without aneurysm or dissection.  Minimal dependent atelectasis at the right lung base. Minimal lumbar spine degenerative changes.  IMPRESSION:  1.  Moderate diffuse distal esophageal wall thickening, most likely due to esophagitis.  A concentric neoplasm is less likely. Correlation with the recent endoscopy findings is necessary. 2.  Small sliding hiatal hernia. 3.  Diffuse hepatic steatosis.   Original Report Authenticated By: Beckie Salts, M.D.    Dg Shoulder Left  01/13/2013  *RADIOLOGY REPORT*  Clinical Data: 52 year old female with joint pain left shoulder.  LEFT SHOULDER - 2+ VIEW  Comparison: Chest radiographs 12/26/2012.  Findings: No glenohumeral joint dislocation.   Bone mineralization is within normal limits.  Proximal left humerus  intact.  Left clavicle and scapula intact.  Visualized left ribs and lung parenchyma within normal limits.  Cervical ACDF hardware partially visible.  IMPRESSION: No acute osseous abnormality identified about the left shoulder.   Original Report Authenticated By: Erskine Speed, M.D.     Microbiology: No results found for this or any previous visit (from the past 240 hour(s)).   Labs: Basic Metabolic Panel:  Recent Labs Lab 01/26/13 1753 01/27/13 0410 01/28/13 0405 01/29/13 0355 01/29/13 1543  NA 139 139 133* 129* 129*  K 3.1* 2.5* 3.3* 2.6* 3.5  CL 82* 94* 94* 88* 90*  CO2 18* 17* 19 24 25   GLUCOSE 123* 84 77 89 106*  BUN 10 8 <3* <3* <3*  CREATININE 1.05 0.70 0.50 0.42* 0.45*  CALCIUM 11.5* 8.7 8.3* 8.7 8.7  MG  --   --  0.8* 1.6  --    Liver Function Tests:  Recent Labs Lab 01/26/13 1753 01/27/13 0410 01/28/13 0405  AST 127* 75* 66*  ALT 59* 39* 30  ALKPHOS 215* 145* 137*  BILITOT 0.7 0.4 0.4  PROT 9.7* 7.2 6.8  ALBUMIN 5.1 3.7 3.4*    Recent Labs Lab 01/26/13 1753 01/28/13 0405 01/29/13 0355  LIPASE 583* 444* 508*   No results found for this basename: AMMONIA,  in the last 168 hours CBC:  Recent Labs Lab 01/26/13 1753 01/27/13 0410 01/28/13 0405  WBC 12.6* 9.8 7.4  NEUTROABS 10.2*  --   --   HGB 10.8* 8.4* 8.2*  HCT 34.3* 26.4* 26.2*  MCV 87.1 86.3 86.5  PLT 479* 303 292   Cardiac Enzymes:  Recent Labs Lab 01/26/13 1753  TROPONINI <0.30   BNP: BNP (last 3 results) No results found for this basename: PROBNP,  in the last 8760 hours CBG:  Recent Labs Lab 01/29/13 1142  GLUCAP 123*       Signed:  Rourke Mcquitty  [facility] 01/29/2013, 4:44 PM

## 2013-01-29 NOTE — Progress Notes (Signed)
Subjective: No complaints.    Objective: Vital signs in last 24 hours: Temp:  [98.2 F (36.8 C)-99.3 F (37.4 C)] 98.2 F (36.8 C) (03/13 0515) Pulse Rate:  [95-104] 104 (03/13 0515) Resp:  [18] 18 (03/13 0515) BP: (127-149)/(76-86) 149/76 mmHg (03/13 0515) SpO2:  [92 %-100 %] 100 % (03/13 0515) Last BM Date: 01/25/13  Intake/Output from previous day: 03/12 0701 - 03/13 0700 In: 1600 [P.O.:600; I.V.:1000] Out: 2200 [Urine:2200] Intake/Output this shift:    General appearance: alert and no distress GI: soft, non-tender; bowel sounds normal; no masses,  no organomegaly  Lab Results:  Recent Labs  01/26/13 1753 01/27/13 0410 01/28/13 0405  WBC 12.6* 9.8 7.4  HGB 10.8* 8.4* 8.2*  HCT 34.3* 26.4* 26.2*  PLT 479* 303 292   BMET  Recent Labs  01/27/13 0410 01/28/13 0405 01/29/13 0355  NA 139 133* 129*  K 2.5* 3.3* 2.6*  CL 94* 94* 88*  CO2 17* 19 24  GLUCOSE 84 77 89  BUN 8 <3* <3*  CREATININE 0.70 0.50 0.42*  CALCIUM 8.7 8.3* 8.7   LFT  Recent Labs  01/28/13 0405  PROT 6.8  ALBUMIN 3.4*  AST 66*  ALT 30  ALKPHOS 137*  BILITOT 0.4   PT/INR No results found for this basename: LABPROT, INR,  in the last 72 hours Hepatitis Panel No results found for this basename: HEPBSAG, HCVAB, HEPAIGM, HEPBIGM,  in the last 72 hours C-Diff No results found for this basename: CDIFFTOX,  in the last 72 hours Fecal Lactopherrin No results found for this basename: FECLLACTOFRN,  in the last 72 hours  Studies/Results: No results found.  Medications:  Scheduled: . multivitamin with minerals  1 tablet Oral Daily  . nicotine  7 mg Transdermal Daily  . pantoprazole  40 mg Oral BID  . potassium chloride  30 mEq Oral Once  . sucralfate  1 g Oral TID WC & HS  . thiamine  100 mg Oral Daily   Or  . thiamine  100 mg Intravenous Daily   Continuous:   Assessment/Plan: 1) ETOH pancreatitis. 2) ETOH abuse. 3) Anemia.   The patient appears to be stable for D/C.  I  will have her follow up in the office and continue the work up for her anemia.  The EGD was negative as well as the biopsies for Celiac disease.  Plan: 1) Okay for D/C. 2) Replete K+ as you are doing. 3) Follow up in 2 weeks. 4) Signing off.   LOS: 3 days   Nellie Pester D 01/29/2013, 7:49 AM

## 2013-01-29 NOTE — Progress Notes (Signed)
   Critical value received:  Potassium 2.6  Date of notification:  01/29/2013  Time of notification:  508  Critical value read back:yes  Nurse who received alert:  Macarius Ruark   MD notified (1st page):  Schorr     Time of first page:  509  MD notified (2nd page):  Time of second page:  Responding MD:  Schorr  Time MD responded:  523

## 2013-01-29 NOTE — Discharge Instructions (Signed)
Acute Pancreatitis Acute pancreatitis is a disease in which the pancreas becomes suddenly irritated (inflamed). The pancreas is a large gland behind your stomach. The pancreas makes enzymes that help digest food. The pancreas also makes 2 hormones that help control your blood sugar. Acute pancreatitis happens when the enzymes attack and damage the pancreas. Most attacks last a couple of days and can cause serious problems. HOME CARE  Follow your doctor's diet instructions. You may need to avoid alcohol and limit fat in your diet.  Eat small meals often.  Drink enough fluids to keep your pee (urine) clear or pale yellow.  Only take medicines as told by your doctor.  Avoid drinking alcohol if it caused your disease.  Do not smoke.  Get plenty of rest.  Check your blood sugar at home as told by your doctor.  Keep all doctor visits as told. GET HELP RIGHT AWAY IF:   You are unable to eat or keep fluids down.  Your pain becomes severe.  You have a fever or lasting symptoms for more than 2 to 3 days.  You have a fever and your symptoms suddenly get worse.  Your skin or the white part of your eyes turn yellow (jaundice).  You throw up (vomit).  You feel dizzy, or you pass out (faint).  Your blood sugar is high (over 300 mg/dL).  You do not get better as quickly as expected.  You have new or worsening symptoms.  You have lasting pain, weakness, or feel sick to your stomach (nauseous).  You get better and then have another pain attack. MAKE SURE YOU:   Understand these instructions.  Will watch your condition.  Will get help right away if you are not doing well or get worse. Document Released: 04/23/2008 Document Revised: 05/06/2012 Document Reviewed: 02/14/2012 [facility] Patient Information 2013 [place], [place].  Alcohol and Nutrition Nutrition serves two purposes. It provides energy. It also maintains body structure and function. Food supplies energy. It also provides  the building blocks needed to replace worn or damaged cells. Alcoholics often eat poorly. This limits their supply of essential nutrients. This affects energy supply and structure maintenance. Alcohol also affects the body's nutrients in:  Digestion.  Storage.  Using and getting rid of waste products. IMPAIRMENT OF NUTRIENT DIGESTION AND UTILIZATION   Once ingested, food must be broken down into small components (digested). Then it is available for energy. It helps maintain body structure and function. Digestion begins in the mouth. It continues in the stomach and intestines, with help from the pancreas. The nutrients from digested food are absorbed from the intestines into the blood. Then they are carried to the liver. The liver prepares nutrients for:  Immediate use.  Storage and future use.  Alcohol inhibits the breakdown of nutrients into usable molecules.  It decreases secretion of digestive enzymes from the pancreas.  Alcohol impairs nutrient absorption by damaging the cells lining the stomach and intestines.  It also interferes with moving some nutrients into the blood.  In addition, nutritional deficiencies themselves may lead to further absorption problems.  For example, folate deficiency changes the cells that line the small intestine. This impairs how water is absorbed. It also affects absorbed nutrients. These include glucose, sodium, and additional folate.  Even if nutrients are digested and absorbed, alcohol can prevent them from being fully used. It changes their transport, storage, and excretion. Impaired utilization of nutrients by alcoholics is indicated by:  Decreased liver stores of vitamins, such as vitamin  A.  Increased excretion of nutrients such as fat. ALCOHOL AND ENERGY SUPPLY   Three basic nutritional components found in food are:  Carbohydrates.  Proteins.  Fats.  These are used as energy. Some alcoholics take in as much as 50% of their total  daily calories from alcohol. They often neglect important foods.  Even when enough food is eaten, alcohol can impair the ways the body controls blood sugar (glucose) levels. It may either increase or decrease blood sugar.  In non-diabetic alcoholics, increased blood sugar (hyperglycemia) is caused by poor insulin secretion. It is usually temporary.  Decreased blood sugar (hypoglycemia) can cause serious injury even if this condition is short-lived. Low blood sugar can happen when a fasting or malnourished person drinks alcohol. When there is no food to supply energy, stored sugar is used up. The products of alcohol inhibit forming glucose from other compounds such as amino acids. As a result, alcohol causes the brain and other body tissue to lack glucose. It is needed for energy and function.  Alcohol is an energy source. But how the body processes and uses the energy from alcohol is complex. Also, when alcohol is substituted for carbohydrates, subjects tend to lose weight. This indicates that they get less energy from alcohol than from food. ALCOHOL - MAINTAINING CELL STRUCTURE AND FUNCTION  Structure Cells are made mostly of protein. So an adequate protein diet is important for maintaining cell structure. This is especially true if cells are being damaged. Research indicates that alcohol affects protein nutrition by causing impaired:  Digestion of proteins to amino acids.  Processing of amino acids by the small intestine and liver.  Synthesis of proteins from amino acids.  Protein secretion by the liver. Function Nutrients are essential for the body to function well. They provide the tools that the body needs to work well:   Proteins.  Vitamins.  Minerals. Alcohol can disrupt body function. It may cause nutrient deficiencies. And it may interfere with the way nutrients are processed. Vitamins  Vitamins are essential to maintain growth and normal metabolism. They regulate many of the  body`s processes. Chronic heavy drinking causes deficiencies in many vitamins. This is caused by eating less. And, in some cases, vitamins may be poorly absorbed. For example, alcohol inhibits fat absorption. It impairs how the vitamins A, E, and D are normally absorbed along with dietary fats. Not enough vitamin A may cause night blindness. Not enough vitamin D may cause softening of the bones.  Some alcoholics lack vitamins A, C, D, E, K, and the B vitamins. These are all involved in wound healing and cell maintenance. In particular, because vitamin K is necessary for blood clotting, lacking that vitamin can cause delayed clotting. The result is excess bleeding. Lacking other vitamins involved in brain function may cause severe neurological damage. Minerals Deficiencies of minerals such as calcium, magnesium, iron, and zinc are common in alcoholics. The alcohol itself does not seem to affect how these minerals are absorbed. Rather, they seem to occur secondary to other alcohol-related problems, such as:  Less calcium absorbed.  Not enough magnesium.  More urinary excretion.  Vomiting.  Diarrhea.  Not enough iron due to gastrointestinal bleeding.  Not enough zinc or losses related to other nutrient deficiencies.  Mineral deficiencies can cause a variety of medical consequences. These range from calcium-related bone disease to zinc-related night blindness and skin lesions. ALCOHOL, MALNUTRITION, AND MEDICAL COMPLICATIONS  Liver Disease   Alcoholic liver damage is caused primarily by alcohol  itself. But poor nutrition may increase the risk of alcohol-related liver damage. For example, nutrients normally found in the liver are known to be affected by drinking alcohol. These include carotenoids, which are the major sources of vitamin A, and vitamin E compounds. Decreases in such nutrients may play some role in alcohol-related liver damage. Pancreatitis  Research suggests that malnutrition may  increase the risk of developing alcoholic pancreatitis. Research suggests that a diet lacking in protein may increase alcohol's damaging effect on the pancreas. Brain  Nutritional deficiencies may have severe effects on brain function. These may be permanent. Specifically, thiamine deficiencies are often seen in alcoholics. They can cause severe neurological problems. These include:  Impaired movement.  Memory loss seen in Wernicke-Korsakoff syndrome. Pregnancy  Alcohol has toxic effects on fetal development. It causes alcohol-related birth defects. They include fetal alcohol syndrome. Alcohol itself is toxic to the fetus. Also, the nutritional deficiency can affect how the fetus develops. That may compound the risk of developmental damage.  Nutritional needs during pregnancy are 10% to 30% greater than normal. Food intake can increase by as much as 140% to cover the needs of both mother and fetus. An alcoholic mother`s nutritional problems may adversely affect the nutrition of the fetus. And alcohol itself can also restrict nutrition flow to the fetus. NUTRITIONAL STATUS OF ALCOHOLICS  Techniques for assessing nutritional status include:  Taking body measurements to estimate fat reserves. They include:  Weight.  Height.  Mass.  Skin fold thickness.  Performing blood analysis to provide measurements of circulating:  Proteins.  Vitamins.  Minerals.  These techniques tend to be imprecise. For many nutrients, there is no clear "cut-off" point that would allow an accurate definition of deficiency. So assessing the nutritional status of alcoholics is limited by these techniques. Dietary status may provide information about the risk of developing nutritional problems. Dietary status is assessed by:  Taking patients' dietary histories.  Evaluating the amount and types of food they are eating.  It is difficult to determine what exact amount of alcohol begins to have damaging effects  on nutrition. In general, moderate drinkers have 2 drinks or less per day. They seem to be at little risk for nutritional problems. Various medical disorders begin to appear at greater levels.  Research indicates that the majority of even the heaviest drinkers have few obvious nutritional deficiencies. Many alcoholics who are hospitalized for medical complications of their disease do have severe malnutrition. Alcoholics tend to eat poorly. Often they eat less than the amounts of food necessary to provide enough:  Carbohydrates.  Protein.  Fat.  Vitamins A and C.  B vitamins.  Minerals like calcium and iron. Of major concern is alcohol's effect on digesting food and use of nutrients. It may shift a mildly malnourished person toward severe malnutrition. Document Released: 08/30/2005 Document Revised: 01/28/2012 Document Reviewed: 02/13/2006 [facility] Patient Information 2013 [place], [place].

## 2013-01-29 NOTE — Progress Notes (Signed)
Clinical Social Work Department BRIEF PSYCHOSOCIAL ASSESSMENT 01/29/2013  Patient:  Holly Phelps, Holly Phelps     Account Number:  [id number]     Admit date:  01/26/2013  Clinical Social Worker:  Hattie Perch  Date/Time:  01/29/2013 12:00 M  Referred by:  Physician  Date Referred:  01/29/2013 Referred for  Substance Abuse   Other Referral:   Interview type:  Patient Other interview type:    PSYCHOSOCIAL DATA Living Status:  FAMILY Admitted from facility:   Level of care:   Primary support name:  Rosette Reveal Primary support relationship to patient:  SPOUSE Degree of support available:   fair    CURRENT CONCERNS Current Concerns  Substance Abuse   Other Concerns:    SOCIAL WORK ASSESSMENT / PLAN CSW met with patient. Patient is alert and oriented X3. CSW discussed patient's etoh abuse with patient. Patient does not think she needs any help and declines any resources from CSW   Assessment/plan status:   Other assessment/ plan:   Information/referral to community resources:    PATIENT'S/FAMILY'S RESPONSE TO PLAN OF CARE: Patient declines resources or any assistance for her substance abuse issue. She also denies that she has a substance abuse issue. No further CSW needs noted.

## 2013-02-11 ENCOUNTER — Ambulatory Visit (INDEPENDENT_AMBULATORY_CARE_PROVIDER_SITE_OTHER): Payer: BC Managed Care – PPO | Admitting: Family Medicine

## 2013-02-11 VITALS — BP 144/88 | HR 98 | Temp 97.7°F | Resp 16 | Ht 62.0 in | Wt 108.2 lb

## 2013-02-11 DIAGNOSIS — Z8719 Personal history of other diseases of the digestive system: Secondary | ICD-10-CM

## 2013-02-11 DIAGNOSIS — K209 Esophagitis, unspecified without bleeding: Secondary | ICD-10-CM

## 2013-02-11 DIAGNOSIS — R7989 Other specified abnormal findings of blood chemistry: Secondary | ICD-10-CM

## 2013-02-11 DIAGNOSIS — K625 Hemorrhage of anus and rectum: Secondary | ICD-10-CM

## 2013-02-11 DIAGNOSIS — R6889 Other general symptoms and signs: Secondary | ICD-10-CM

## 2013-02-11 DIAGNOSIS — E876 Hypokalemia: Secondary | ICD-10-CM

## 2013-02-11 LAB — COMPREHENSIVE METABOLIC PANEL
ALT: 34 U/L (ref 0–35)
AST: 43 U/L — ABNORMAL HIGH (ref 0–37)
Albumin: 4 g/dL (ref 3.5–5.2)
Alkaline Phosphatase: 169 U/L — ABNORMAL HIGH (ref 39–117)
BUN: 7 mg/dL (ref 6–23)
CO2: 28 mEq/L (ref 19–32)
Calcium: 10.2 mg/dL (ref 8.4–10.5)
Chloride: 100 mEq/L (ref 96–112)
Creat: 0.52 mg/dL (ref 0.50–1.10)
Glucose, Bld: 89 mg/dL (ref 70–99)
Potassium: 4.4 mEq/L (ref 3.5–5.3)
Sodium: 138 mEq/L (ref 135–145)
Total Bilirubin: 0.3 mg/dL (ref 0.3–1.2)
Total Protein: 6.9 g/dL (ref 6.0–8.3)

## 2013-02-11 LAB — MAGNESIUM: Magnesium: 1.6 mg/dL (ref 1.5–2.5)

## 2013-02-11 LAB — TSH: TSH: 6.212 u[IU]/mL — ABNORMAL HIGH (ref 0.350–4.500)

## 2013-02-11 NOTE — Patient Instructions (Signed)
Continue same medications  Avoid any alcohol  Return if stomach upset or pains

## 2013-02-11 NOTE — Progress Notes (Signed)
Subjective: Patient is here for several things. She was in the hospital a couple of weeks ago. It appears that she was in there with pancreatitis and esophagitis.  She also has a history of having had a referral recently to a gastroenterologist for a colonoscopy for some rectal blood, but she didn't like the physician and has requested that she can go to [facility] for colonoscopy. I told her I  wasn't sure that they would do one, but she says that she caled over there and they said they would. She is feeling okay. She is taking her medicines as ordered which left the hospital. She had a number of labs abnormal, and was asked your thyroid bloodwork recheck  Says she drinks one alcoholic drink every other day, and smokes 3 cigarettes a day.  Objective: Pleasant lady in no major distress. Smells strongly of cigarette smoke. Her neck is supple without nodes. Chest is clear to auscultation. Heart regular without murmurs. Abdomen soft without masses or tenderness.  Assessment: History of elevated TSH, here for repeat History of hypomagnesemia and hypokalemia Recent pancreatitis History of rectal bleeding  Tobacco abuse  Plan: Check her labs. Make a referral she requested. Return when necessary

## 2013-02-13 MED ORDER — LEVOTHYROXINE SODIUM 50 MCG PO TABS: 50.0000 ug | ORAL_TABLET | Freq: Every day | ORAL | Status: DC

## 2013-02-13 NOTE — Addendum Note (Signed)
Addended by: Johnnette Litter on: 02/13/2013 10:08 AM   Modules accepted: Orders

## 2013-02-16 ENCOUNTER — Telehealth: Payer: Self-pay

## 2013-02-16 NOTE — Telephone Encounter (Signed)
Thanks, I have called her to advise. Holly Phelps

## 2013-02-16 NOTE — Telephone Encounter (Signed)
PT STATES SHE IS ITCHING ALL OVER AND THINK THE MEDICINE MAY HAVE SOMETHING TO DO WITH IT. PLEASE CALL [phone] I ADVISED PT TO COME BACK IN IF SHE WAS HAVING A REACTION, BUT SHE WOULD PREFER A CALL BACK    [facility] [address]

## 2013-02-16 NOTE — Telephone Encounter (Signed)
Called her, asked if there is anything else she could be having a reaction to, she states nothing else new, I asked if it could be something she was given in the hospital. She relates it to the Synthroid. Please advise.

## 2013-02-16 NOTE — Telephone Encounter (Signed)
Have her stop the levothyroxine. Since it looks like she may be allergic to a component in the medication and her labs indicate a sub-clinical hypothyroidism we can hold off treatment at this time while we let this rash calm down rather than jumping right into another medication. Why don't we have her come back in to recheck her labs in 6 weeks. If she is still being bothered by pruritis she can take some Benadryl.

## 2013-02-26 ENCOUNTER — Encounter (INDEPENDENT_AMBULATORY_CARE_PROVIDER_SITE_OTHER): Payer: Self-pay | Admitting: General Surgery

## 2013-02-26 ENCOUNTER — Ambulatory Visit (INDEPENDENT_AMBULATORY_CARE_PROVIDER_SITE_OTHER): Payer: BC Managed Care – PPO | Admitting: General Surgery

## 2013-02-26 VITALS — BP 140/80 | HR 88 | Resp 14 | Ht 62.0 in | Wt 108.0 lb

## 2013-02-26 DIAGNOSIS — K625 Hemorrhage of anus and rectum: Secondary | ICD-10-CM

## 2013-02-26 NOTE — Progress Notes (Signed)
Chief Complaint  Patient presents with  . Rectal Bleeding    HISTORY:  Holly Phelps is a 52 y.o. female who presents to clinic with a history of rectal bleeding.  She denies any history of constipation.  She describes blood on her toilet paper about 3 weeks ago for about a week.  This is a new finding for her.    Past Medical History  Diagnosis Date  . Allergy   . Anemia   . 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 Hypertension        Past Surgical History  Procedure Laterality Date  . Laparoscopic appendectomy    . Back surgery      x 2      Current Outpatient Prescriptions  Medication Sig Dispense Refill  . esomeprazole (NEXIUM) 40 MG capsule Take 40 mg by mouth daily before breakfast.      . lipase/protease/amylase (CREON-10/PANCREASE) 12000 UNITS CPEP Take 1 capsule by mouth 3 (three) times daily before meals.  270 capsule  0  . Multiple Vitamin (MULTIVITAMIN WITH MINERALS) TABS Take 1 tablet by mouth daily.  30 tablet  0  . thiamine 100 MG tablet Take 1 tablet (100 mg total) by mouth daily.  30 tablet  0   No current facility-administered medications for this visit.     Allergies  Allergen Reactions  . Codeine Nausea And Vomiting  . Flexeril (Cyclobenzaprine) Itching  . Sulfa Antibiotics     Pt does not recall what the reaction was,been too long  . Synthroid (Levothyroxine) Rash      Family History  Problem Relation Age of Onset  . Stroke Father   . Stroke Brother   . Stroke Brother       History   Social History  . Marital Status: Married    Spouse Name: N/A    Number of Children: N/A  . Years of Education: N/A   Social History Main Topics  . Smoking status: Current Every Day Smoker -- 0.50 packs/day    Types: Cigarettes  . Smokeless tobacco: None     Comment: 3 cigarettes per day  . Alcohol Use: Yes     Comment: occasional  . Drug Use: No  . Sexually Active: Yes   Other Topics Concern  . None   Social History Narrative  . None       REVIEW OF  SYSTEMS - PERTINENT POSITIVES ONLY: Review of Systems - General ROS: negative for - chills, fatigue or fever Hematological and Lymphatic ROS: negative for - bleeding problems, blood clots or bruising Respiratory ROS: no cough, shortness of breath, or wheezing Cardiovascular ROS: no chest pain or dyspnea on exertion Gastrointestinal ROS: positive for - blood in stools negative for - abdominal pain, change in bowel habits, change in stools, constipation or diarrhea  EXAM: Filed Vitals:   02/26/13 1500  BP: 140/80  Pulse: 88  Resp: 14    General appearance: alert and cooperative Resp: clear to auscultation bilaterally Cardio: regular rate and rhythm GI: soft, non-tender; bowel sounds normal; no masses,  no organomegaly  Procedure: Anoscopy Surgeon: Akil Hoos Assistant: Glaspey After the risks and benefits were explained, verbal consent was obtained for above procedure  Anesthesia: none Diagnosis: Rectal bleeding Findings: no hemorrhoidal disease, rectal mucosa possibly slightly inflamed   ASSESSMENT AND PLAN: Holly Phelps is a 52 y.o. F with a history of rectal bleeding.  She has never had a colonoscopy.  I do not see any anorectal   cause for her bleeding either.  Given these findings, I will schedule her for a colonoscopy to evaluate the source of her bleeding further.      Mercury Rock C Nature Vogelsang, MD Colon and Rectal Surgery / General Surgery [facility]      Visit Diagnoses: No diagnosis found.  Primary Care Physician: GUEST, CHRIS WARREN, MD    

## 2013-02-26 NOTE — Patient Instructions (Signed)
[facility]  ONE-DAY (1) PRE-OP HOME COLON PREP INSTRUCTIONS: ** MIRALAX / GATORADE PREP **  You must follow the instructions below carefully.  If you have questions or problems, please call and speak to someone in the clinic department at our office:   [phone].     INSTRUCTIONS: 1. Five days prior to your procedure do not eat nuts, popcorn, or fruit with seeds.  Stop all fiber supplements such as Metamucil, Citrucel, etc. 2. Two days before procedure purchase the supplies below and drink only liquids this day.  No solid food.         MIRALAX - GATORADE -- DULCOLAX TABS:   Purchase a bottle of MIRALAX  (255 gm bottle)    In addition, purchase four (4) DULCOLAX TABLETS (no prescription required- ask the pharmacist if you can't find them)    Purchase one 64 oz GATORADE.  (Do NOT purchase red Gatorade; any other flavor is acceptable) and place in refrigerator to get cold.  3.   Day Before Surgery:   6 am: take the 4 Dulcolax tablets   You may only have clear liquids (tea, coffee, juice, broth, jello, soft drinks, gummy bears).  You cannot have solid foods, cream, milk or milk products.  Drink at lease 8 ounces of liquids every hour while awake.   Mix the entire bottle of MiraLax and the Gatorade in a large container.    10:00am: Begin drinking the Gatorade mixture until gone (8 oz every 15-30 minutes).      You may suck on a lime wedge or hard candy to "freshen your palate" in between glasses   If you are a diabetic, take your blood sugar reading several time throughout the prep.  Have some juice available to take if your sugar level gets too low   You may feel chilled while taking the prep.  Have some warm tea or broth to help warm up.   Continue clear liquids until midnight or bedtime  3. The day of your procedure:   Do not eat or drink ANYTHING after midnight before your surgery.     If you take Heart or Blood Pressure medicine, ask the pre-op nurses about these during your  preop appointment.   Further pre-operative instructions will be given to you from the hospital.   Expect to be contacted 5-7 days before your surgery.

## 2013-03-13 ENCOUNTER — Ambulatory Visit (HOSPITAL_COMMUNITY)
Admission: RE | Admit: 2013-03-13 | Discharge: 2013-03-13 | Disposition: A | Discharge status: Home / Self Care | Payer: BC Managed Care – PPO | Source: Ambulatory Visit | Attending: General Surgery | Admitting: General Surgery

## 2013-03-13 ENCOUNTER — Encounter (HOSPITAL_COMMUNITY): Payer: Self-pay | Admitting: *Deleted

## 2013-03-13 ENCOUNTER — Encounter (HOSPITAL_COMMUNITY)
Admission: RE | Disposition: A | Discharge status: Home / Self Care | Payer: Self-pay | Source: Ambulatory Visit | Attending: General Surgery

## 2013-03-13 DIAGNOSIS — I10 Essential (primary) hypertension: Secondary | ICD-10-CM | POA: Insufficient documentation

## 2013-03-13 DIAGNOSIS — K5730 Diverticulosis of large intestine without perforation or abscess without bleeding: Secondary | ICD-10-CM

## 2013-03-13 DIAGNOSIS — K625 Hemorrhage of anus and rectum: Secondary | ICD-10-CM | POA: Insufficient documentation

## 2013-03-13 DIAGNOSIS — Z79899 Other long term (current) drug therapy: Secondary | ICD-10-CM | POA: Insufficient documentation

## 2013-03-13 HISTORY — DX: Gastro-esophageal reflux disease without esophagitis: K21.9

## 2013-03-13 HISTORY — PX: COLONOSCOPY: SHX5424

## 2013-03-13 HISTORY — DX: Hypothyroidism, unspecified: E03.9

## 2013-03-13 SURGERY — COLONOSCOPY
Anesthesia: Moderate Sedation

## 2013-03-13 MED ORDER — FENTANYL CITRATE 0.05 MG/ML IJ SOLN
INTRAMUSCULAR | Status: DC | PRN
  Administered 2013-03-13 (×4): 25 ug via INTRAVENOUS

## 2013-03-13 MED ORDER — MIDAZOLAM HCL 10 MG/2ML IJ SOLN
INTRAMUSCULAR | Status: DC | PRN
  Administered 2013-03-13 (×2): 1 mg via INTRAVENOUS
  Administered 2013-03-13 (×3): 2 mg via INTRAVENOUS

## 2013-03-13 MED ORDER — FENTANYL CITRATE 0.05 MG/ML IJ SOLN
INTRAMUSCULAR | Status: AC
  Filled 2013-03-13: qty 4

## 2013-03-13 MED ORDER — MIDAZOLAM HCL 10 MG/2ML IJ SOLN
INTRAMUSCULAR | Status: AC
  Filled 2013-03-13: qty 4

## 2013-03-13 MED ORDER — SODIUM CHLORIDE 0.9 % IV SOLN: INTRAVENOUS | Status: DC

## 2013-03-13 MED ORDER — DIPHENHYDRAMINE HCL 50 MG/ML IJ SOLN
INTRAMUSCULAR | Status: AC
  Filled 2013-03-13: qty 1

## 2013-03-13 NOTE — Interval H&P Note (Signed)
History and Physical Interval Note:  03/13/2013 7:20 AM  Holly Phelps  has presented today for surgery, with the diagnosis of rectal bleeding  The various methods of treatment have been discussed with the patient and family. After consideration of risks, benefits and other options for treatment, the patient has consented to  Procedure(s): COLONOSCOPY (N/A) as a surgical intervention .  The patient's history has been reviewed, patient examined, no change in status, stable for surgery.  I have reviewed the patient's chart and labs.  Questions were answered to the patient's satisfaction.     Vanita Panda, MD  Colorectal and General Surgery [facility]

## 2013-03-13 NOTE — Op Note (Signed)
[facility] [address] [place], [address]   COLONOSCOPY PROCEDURE REPORT  PATIENT: Holly, Phelps  MR#: [id number] BIRTHDATE: 06/12/1961 , 52  yrs. old GENDER: Female ENDOSCOPIST: Vanita Panda, MD REFERRED AO:ZHYQM, Chris PROCEDURE DATE:  03/13/2013 PROCEDURE:   Colonoscopy, diagnostic ASA CLASS:   Class II INDICATIONS:rectal bleeding. MEDICATIONS: Versed 8 mg IV and Fentanyl 100 mcg IV  DESCRIPTION OF PROCEDURE:   After the risks benefits and alternatives of the procedure were thoroughly explained, informed consent was obtained.  A digital rectal exam revealed no abnormalities of the perianal region.   The Pentax Ped Colon [id number]  endoscope was introduced through the anus and advanced to the cecum, which was identified by both the appendix and ileocecal valve. No adverse events experienced.   The quality of the prep was adequate, using MiraLax  The instrument was then slowly withdrawn as the colon was fully examined.    Findings: Diverticulosis   COLON FINDINGS: Diverticulum was found in the transverse colon, descending colon, and sigmoid colon.  Retroflexed views revealed no abnormalities.  Withdrawal time was 18 minutes.  The scope was withdrawn and the procedure completed.  ENDOSCOPIC IMPRESSION:     Diverticulum in the transverse colon, descending colon, and sigmoid colon No polyps or masses identifies   RECOMMENDATIONS:     1.  Call to schedule a follow-up appointment with Routine colonoscopy for screening in 10 years 2.  High fiber diet with liberal fluid intake.   eSigned:  Vanita Panda, MD 03/13/2013 8:24 AM   cc: Robert Bellow, MD

## 2013-03-13 NOTE — Discharge Instructions (Addendum)
Post Colonoscopy Instructions  1. DIET: Follow a light bland diet the first 24 hours after arrival home, such as soup, liquids, crackers, etc.  Be sure to include lots of fluids daily.  Avoid fast food or heavy meals as your are more likely to get nauseated.   2. You may have some mild rectal bleeding for the first few days after the procedure.  This should get less and less with time.  Resume any blood thinners 2 days after your procedure unless directed otherwise by your physician. 3. Take your usually prescribed home medications unless otherwise directed. a. If you have any pain, it is helpful to get up and walk around, as it is usually from excess gas. b. If this is not helpful, you can take an over-the-counter pain medication.  Choose one of the following that works best for you: i. Naproxen (Aleve, etc)  Two 220mg  tabs twice a day ii. Ibuprofen (Advil, etc) Three 200mg  tabs four times a day (every meal & bedtime) iii. If you still have pain after using one of these, please call the office 4. It is normal to not have a bowel movement for 2-3 days after colonoscopy.    5. ACTIVITIES as tolerated:   6. You may resume regular (light) daily activities beginning the next day--such as daily self-care, walking, climbing stairs--gradually increasing activities as tolerated.    WHEN TO CALL us [phone]: 1. Fever over 101.5 F (38.5 C)  2. Severe abdominal or chest pain  3. Large amount of rectal bleeding, passing multiple blood clots  4. Dizziness or shortness of breath 5. Increasing nausea or vomiting   The clinic staff is available to answer your questions during regular business hours (8:30am-5pm).  Please don't hesitate to call and ask to speak to one of our nurses for clinical concerns.   If you have a medical emergency, go to the nearest emergency room or call 911.  A surgeon from [facility] is always on call at the [facility], [place] [address], [address], [place], [place]  [address] ? MAIN: [phone] ? TOLL FREE: [phone] ?  FAX [phone] [email or web address]  High-Fiber Diet Fiber is found in fruits, vegetables, and grains. A high-fiber diet encourages the addition of more whole grains, legumes, fruits, and vegetables in your diet. The recommended amount of fiber for adult males is 38 g per day. For adult females, it is 25 g per day. Pregnant and lactating women should get 28 g of fiber per day. If you have a digestive or bowel problem, ask your caregiver for advice before adding high-fiber foods to your diet. Eat a variety of high-fiber foods instead of only a select few type of foods.  PURPOSE  To increase stool bulk.  To make bowel movements more regular to prevent constipation.  To lower cholesterol.  To prevent overeating. WHEN IS THIS DIET USED?  It may be used if you have constipation and hemorrhoids.  It may be used if you have uncomplicated diverticulosis (intestine condition) and irritable bowel syndrome.  It may be used if you need help with weight management.  It may be used if you want to add it to your diet as a protective measure against atherosclerosis, diabetes, and cancer. SOURCES OF FIBER  Whole-grain breads and cereals.  Fruits, such as apples, oranges, bananas, berries, prunes, and pears.  Vegetables, such as green peas, carrots, sweet potatoes, beets, broccoli, cabbage, spinach, and artichokes.  Legumes, such split peas, soy, lentils.  Almonds. FIBER CONTENT IN FOODS Starches and Grains / Dietary Fiber (g)  Cheerios, 1 cup / 3 g  Corn Flakes cereal, 1 cup / 0.7 g  Rice crispy treat cereal, 1 cup / 0.3 g  Instant oatmeal (cooked),  cup / 2 g  Frosted wheat cereal, 1 cup / 5.1 g  Brown, long-grain rice (cooked), 1 cup / 3.5 g  White, long-grain rice (cooked), 1 cup / 0.6 g  Enriched macaroni (cooked), 1 cup / 2.5 g Legumes / Dietary Fiber  (g)  Baked beans (canned, plain, or vegetarian),  cup / 5.2 g  Kidney beans (canned),  cup / 6.8 g  Pinto beans (cooked),  cup / 5.5 g Breads and Crackers / Dietary Fiber (g)  Plain or honey graham crackers, 2 squares / 0.7 g  Saltine crackers, 3 squares / 0.3 g  Plain, salted pretzels, 10 pieces / 1.8 g  Whole-wheat bread, 1 slice / 1.9 g  White bread, 1 slice / 0.7 g  Raisin bread, 1 slice / 1.2 g  Plain bagel, 3 oz / 2 g  Flour tortilla, 1 oz / 0.9 g  Corn tortilla, 1 small / 1.5 g  Hamburger or hotdog bun, 1 small / 0.9 g Fruits / Dietary Fiber (g)  Apple with skin, 1 medium / 4.4 g  Sweetened applesauce,  cup / 1.5 g  Banana,  medium / 1.5 g  Grapes, 10 grapes / 0.4 g  Orange, 1 small / 2.3 g  Raisin, 1.5 oz / 1.6 g  Melon, 1 cup / 1.4 g Vegetables / Dietary Fiber (g)  Green beans (canned),  cup / 1.3 g  Carrots (cooked),  cup / 2.3 g  Broccoli (cooked),  cup / 2.8 g  Peas (cooked),  cup / 4.4 g  Mashed potatoes,  cup / 1.6 g  Lettuce, 1 cup / 0.5 g  Corn (canned),  cup / 1.6 g  Tomato,  cup / 1.1 g Document Released: 11/05/2005 Document Revised: 05/06/2012 Document Reviewed: 02/07/2012 [facility] Patient Information 2013 [place], [place].    Fiber Content in Foods Drinking plenty of fluids and consuming foods high in fiber can help with constipation. See the list below for the fiber content of some common foods. Starches and Grains / Dietary Fiber (g)  Cheerios, 1 cup / 3 g  Kellogg's Corn Flakes, 1 cup / 0.7 g  Rice Krispies, 1  cup / 0.3 g  Quaker Oat Life Cereal,  cup / 2.1 g  Oatmeal, instant (cooked),  cup / 2 g  Kellogg's Frosted Mini Wheats, 1 cup / 5.1 g  Rice, brown, long-grain (cooked), 1 cup / 3.5 g  Rice, white, long-grain (cooked), 1 cup / 0.6 g  Macaroni, cooked, enriched, 1 cup / 2.5 g Legumes / Dietary Fiber (g)  Beans, baked, canned, plain or vegetarian,  cup / 5.2 g  Beans, kidney, canned,   cup / 6.8 g  Beans, pinto, dried (cooked),  cup / 7.7 g  Beans, pinto, canned,  cup / 5.5 g Breads and Crackers / Dietary Fiber (g)  Graham crackers, plain or honey, 2 squares / 0.7 g  Saltine crackers, 3 squares / 0.3 g  Pretzels, plain, salted, 10 pieces / 1.8 g  Bread, whole-wheat, 1 slice / 1.9 g  Bread, white, 1 slice / 0.7 g  Bread, raisin, 1 slice / 1.2 g  Bagel, plain, 3 oz / 2 g  Tortilla, flour,  1 oz / 0.9 g  Tortilla, corn, 1 small / 1.5 g  Bun, hamburger or hotdog, 1 small / 0.9 g Fruits / Dietary Fiber (g)  Apple, raw with skin, 1 medium / 4.4 g  Applesauce, sweetened,  cup / 1.5 g  Banana,  medium / 1.5 g  Grapes, 10 grapes / 0.4 g  Orange, 1 small / 2.3 g  Raisin, 1.5 oz / 1.6 g  Melon, 1 cup / 1.4 g Vegetables / Dietary Fiber (g)  Green beans, canned,  cup / 1.3 g  Carrots (cooked),  cup / 2.3 g  Broccoli (cooked),  cup / 2.8 g  Peas, frozen (cooked),  cup / 4.4 g  Potatoes, mashed,  cup / 1.6 g  Lettuce, 1 cup / 0.5 g  Corn, canned,  cup / 1.6 g  Tomato,  cup / 1.1 g Document Released: 03/24/2007 Document Revised: 01/28/2012 Document Reviewed: 05/19/2007 [facility] Patient Information 2013 [place], [place].

## 2013-03-13 NOTE — H&P (View-Only) (Signed)
Chief Complaint  Patient presents with  . Rectal Bleeding    HISTORY:  Holly Phelps is a 52 y.o. female who presents to clinic with a history of rectal bleeding.  She denies any history of constipation.  She describes blood on her toilet paper about 3 weeks ago for about a week.  This is a new finding for her.    Past Medical History  Diagnosis Date  . Allergy   . Anemia   . 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 Hypertension        Past Surgical History  Procedure Laterality Date  . Laparoscopic appendectomy    . Back surgery      x 2      Current Outpatient Prescriptions  Medication Sig Dispense Refill  . esomeprazole (NEXIUM) 40 MG capsule Take 40 mg by mouth daily before breakfast.      . lipase/protease/amylase (CREON-10/PANCREASE) 12000 UNITS CPEP Take 1 capsule by mouth 3 (three) times daily before meals.  270 capsule  0  . Multiple Vitamin (MULTIVITAMIN WITH MINERALS) TABS Take 1 tablet by mouth daily.  30 tablet  0  . thiamine 100 MG tablet Take 1 tablet (100 mg total) by mouth daily.  30 tablet  0   No current facility-administered medications for this visit.     Allergies  Allergen Reactions  . Codeine Nausea And Vomiting  . Flexeril (Cyclobenzaprine) Itching  . Sulfa Antibiotics     Pt does not recall what the reaction was,been too long  . Synthroid (Levothyroxine) Rash      Family History  Problem Relation Age of Onset  . Stroke Father   . Stroke Brother   . Stroke Brother       History   Social History  . Marital Status: Married    Spouse Name: N/A    Number of Children: N/A  . Years of Education: N/A   Social History Main Topics  . Smoking status: Current Every Day Smoker -- 0.50 packs/day    Types: Cigarettes  . Smokeless tobacco: None     Comment: 3 cigarettes per day  . Alcohol Use: Yes     Comment: occasional  . Drug Use: No  . Sexually Active: Yes   Other Topics Concern  . None   Social History Narrative  . None       REVIEW OF  SYSTEMS - PERTINENT POSITIVES ONLY: Review of Systems - General ROS: negative for - chills, fatigue or fever Hematological and Lymphatic ROS: negative for - bleeding problems, blood clots or bruising Respiratory ROS: no cough, shortness of breath, or wheezing Cardiovascular ROS: no chest pain or dyspnea on exertion Gastrointestinal ROS: positive for - blood in stools negative for - abdominal pain, change in bowel habits, change in stools, constipation or diarrhea  EXAM: Filed Vitals:   02/26/13 1500  BP: 140/80  Pulse: 88  Resp: 14    General appearance: alert and cooperative Resp: clear to auscultation bilaterally Cardio: regular rate and rhythm GI: soft, non-tender; bowel sounds normal; no masses,  no organomegaly  Procedure: Anoscopy Surgeon: Maisie Fus Assistant: Christella Scheuermann After the risks and benefits were explained, verbal consent was obtained for above procedure  Anesthesia: none Diagnosis: Rectal bleeding Findings: no hemorrhoidal disease, rectal mucosa possibly slightly inflamed   ASSESSMENT AND PLAN: Holly Phelps is a 52 y.o. F with a history of rectal bleeding.  She has never had a colonoscopy.  I do not see any anorectal  cause for her bleeding either.  Given these findings, I will schedule her for a colonoscopy to evaluate the source of her bleeding further.      Vanita Panda, MD Colon and Rectal Surgery / General Surgery [facility]      Visit Diagnoses: No diagnosis found.  Primary Care Physician: Tally Due, MD

## 2013-03-16 ENCOUNTER — Encounter (HOSPITAL_COMMUNITY): Payer: Self-pay | Admitting: General Surgery

## 2013-03-31 ENCOUNTER — Encounter (INDEPENDENT_AMBULATORY_CARE_PROVIDER_SITE_OTHER): Payer: Self-pay | Admitting: General Surgery

## 2013-03-31 ENCOUNTER — Ambulatory Visit (INDEPENDENT_AMBULATORY_CARE_PROVIDER_SITE_OTHER): Payer: BC Managed Care – PPO | Admitting: General Surgery

## 2013-03-31 VITALS — BP 118/78 | HR 88 | Temp 96.3°F | Resp 16 | Ht 62.0 in | Wt 108.0 lb

## 2013-03-31 DIAGNOSIS — K625 Hemorrhage of anus and rectum: Secondary | ICD-10-CM

## 2013-03-31 NOTE — Progress Notes (Signed)
Holly Phelps is a 52 y.o. female who is here for a follow up visit regarding her rectal bleeding.  This has stopped and her colonscopy was normal.    Objective: Filed Vitals:   03/31/13 0935  BP: 118/78  Pulse: 88  Temp: 96.3 F (35.7 C)  Resp: 16    General appearance: alert and cooperative GI: normal findings: soft, non-tender   Assessment and Plan: Repeat colonoscopy in 10 years.  Follow up as needed.  Cont high fiber diet    .Vanita Panda, MD [facility], [place] [phone]

## 2013-03-31 NOTE — Patient Instructions (Signed)
Eat a high fiber diet and drink plenty of liquids.  You do not need a colonoscopy for another 77yrs.  Return to the office as needed.

## 2013-08-06 ENCOUNTER — Ambulatory Visit (INDEPENDENT_AMBULATORY_CARE_PROVIDER_SITE_OTHER): Payer: BC Managed Care – PPO | Admitting: Family Medicine

## 2013-08-06 VITALS — BP 118/72 | HR 94 | Temp 97.9°F | Resp 18 | Ht 62.0 in | Wt 112.0 lb

## 2013-08-06 DIAGNOSIS — N23 Unspecified renal colic: Secondary | ICD-10-CM

## 2013-08-06 DIAGNOSIS — R3915 Urgency of urination: Secondary | ICD-10-CM

## 2013-08-06 DIAGNOSIS — N390 Urinary tract infection, site not specified: Secondary | ICD-10-CM

## 2013-08-06 DIAGNOSIS — R350 Frequency of micturition: Secondary | ICD-10-CM

## 2013-08-06 DIAGNOSIS — R309 Painful micturition, unspecified: Secondary | ICD-10-CM

## 2013-08-06 LAB — POCT URINALYSIS DIPSTICK
Bilirubin, UA: NEGATIVE
Glucose, UA: NEGATIVE
Ketones, UA: NEGATIVE
Nitrite, UA: NEGATIVE
Protein, UA: 30
Spec Grav, UA: <=1.005
Urobilinogen, UA: 0.2
pH, UA: 6

## 2013-08-06 LAB — POCT UA - MICROSCOPIC ONLY
Bacteria, U Microscopic: NEGATIVE
Casts, Ur, LPF, POC: NEGATIVE
Crystals, Ur, HPF, POC: NEGATIVE
Epithelial cells, urine per micros: NEGATIVE
Mucus, UA: NEGATIVE
Yeast, UA: NEGATIVE

## 2013-08-06 MED ORDER — CIPROFLOXACIN HCL 500 MG PO TABS: 500.0000 mg | ORAL_TABLET | Freq: Two times a day (BID) | ORAL | Status: DC

## 2013-08-06 NOTE — Progress Notes (Signed)
Subjective:    Patient ID: Holly Phelps, female    DOB: [birth year], 52 y.o.   MRN: [id number]  HPI Holly Phelps is a 52 y.o. female  Thinks has UTI.  Started with urgency, frequency, burning with urination - mild initially starting a week ago.  Past 2 days - more sore in lower belly.  No fever. No back pain. Had episode of vomiting 5 days ago, but had been drinking some alcohol prior and thinks something she ate.   Tx: otc Azo.    Alcohol - about a glass per day.  Has cut back from the past. Smoker - 3 cigs per day.     Past Medical History  Diagnosis Date  . Allergy   . Anemia   . 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 Hypertension   . Hypothyroidism   . GERD (gastroesophageal reflux disease)    Past Surgical History  Procedure Laterality Date  . Laparoscopic appendectomy    . Back surgery      x 2  . Colonoscopy N/A 03/13/2013    Procedure: COLONOSCOPY;  Surgeon: Romie Levee, MD;  Location: [facility];  Service: Endoscopy;  Laterality: N/A;    Patient Active Problem List   Diagnosis Date Noted  . Pancreatitis 01/29/2013  . Hyponatremia 01/29/2013  . Anemia 01/29/2013  . Hypokalemia 01/29/2013  . Nausea vomiting and diarrhea 01/26/2013  . Dehydration 01/26/2013  . Esophagitis 01/26/2013  . Elevated lipase 01/26/2013  . HYPOTHYROIDISM 03/01/2008  . ANXIETY 03/01/2008  . ALCOHOL ABUSE 03/01/2008  . HYPERTENSION 03/01/2008  . LIVER FUNCTION TESTS, ABNORMAL, HX OF 03/01/2008   Allergies  Allergen Reactions  . Codeine Nausea And Vomiting  . Flexeril [Cyclobenzaprine] Itching  . Sulfa Antibiotics     Pt does not recall what the reaction was,been too long  . Synthroid [Levothyroxine] Rash   Prior to Admission medications   Medication Sig Start Date End Date Taking? Authorizing Provider  Cranberry-Vitamin C-Probiotic (AZO CRANBERRY PO) Take by mouth.   Yes Historical Provider, MD  esomeprazole (NEXIUM) 40 MG capsule Take 40 mg by mouth daily before breakfast.    Yes Historical Provider, MD  Multiple Vitamin (MULTIVITAMIN WITH MINERALS) TABS Take 1 tablet by mouth daily. 01/29/13  Yes [name], MD  lipase/protease/amylase (CREON-10/PANCREASE) 12000 UNITS CPEP Take 1 capsule by mouth 3 (three) times daily before meals. 01/29/13   [name], MD  thiamine (VITAMIN B-1) 100 MG tablet Take 100 mg by mouth daily.    Historical Provider, MD   History   Social History  . Marital Status: Married    Spouse Name: N/A    Number of Children: N/A  . Years of Education: N/A   Occupational History  . Not on file.   Social History Main Topics  . Smoking status: Current Every Day Smoker -- 0.50 packs/day    Types: Cigarettes  . Smokeless tobacco: Not on file     Comment: 3 cigarettes per day  . Alcohol Use: Yes     Comment: occasional  . Drug Use: No  . Sexual Activity: Yes   Other Topics Concern  . Not on file   Social History Narrative  . No narrative on file     Review of Systems  Constitutional: Negative for fever and chills.  Gastrointestinal: Positive for abdominal pain (lower only. ). Negative for nausea and vomiting (once last saturday, no recent n/v. ).       Lower abdomen/bladder area  Genitourinary:  Positive for dysuria, urgency and frequency. Negative for hematuria, vaginal bleeding, vaginal discharge, difficulty urinating, vaginal pain, menstrual problem and pelvic pain.  Musculoskeletal: Negative for back pain.  Skin: Negative for rash.       Objective:   Physical Exam  Nursing note and vitals reviewed. Constitutional: She is oriented to person, place, and time. She appears well-developed and well-nourished.  HENT:  Head: Normocephalic and atraumatic.  Pulmonary/Chest: Effort normal.  Abdominal: Soft. Normal appearance and bowel sounds are normal. She exhibits no distension. There is tenderness in the suprapubic area. There is no rigidity, no rebound, no guarding, no CVA tenderness, no tenderness at McBurney's point  and negative Murphy's sign.  Neurological: She is alert and oriented to person, place, and time.  Skin: Skin is warm.  Psychiatric: She has a normal mood and affect. Her behavior is normal.    Results for orders placed in visit on 08/06/13  POCT URINALYSIS DIPSTICK      Result Value Range   Color, UA yellow     Clarity, UA clear     Glucose, UA neg     Bilirubin, UA neg     Ketones, UA neg     Spec Grav, UA <=1.005     Blood, UA large     pH, UA 6.0     Protein, UA 30     Urobilinogen, UA 0.2     Nitrite, UA neg     Leukocytes, UA large (3+)    POCT UA - MICROSCOPIC ONLY      Result Value Range   WBC, Ur, HPF, POC TNTC     RBC, urine, microscopic TNTC     Bacteria, U Microscopic neg     Mucus, UA neg     Epithelial cells, urine per micros neg     Crystals, Ur, HPF, POC neg     Casts, Ur, LPF, POC neg     Yeast, UA neg         Assessment & Plan:  Holly Phelps is a 52 y.o. female Frequent urination - Plan: POCT urinalysis dipstick, POCT UA - Microscopic Only, Urine culture  Pain with urination - Plan: POCT urinalysis dipstick, POCT UA - Microscopic Only, Urine culture  Urgency of urination - Plan: POCT urinalysis dipstick, POCT UA - Microscopic Only, Urine culture  UTI (urinary tract infection) - Plan: ciprofloxacin (CIPRO) 500 MG tablet  UTI - start Cipro 500mg  BID, 1 week illness, so will rx abx for full week. Urine cx.  Azo ok, fluids, rtc precautions per h/o.   Declined flu shot today  - recommended.  Meds ordered this encounter  . ciprofloxacin (CIPRO) 500 MG tablet    Sig: Take 1 tablet (500 mg total) by mouth 2 (two) times daily.    Dispense:  14 tablet    Refill:  0   Patient Instructions  Drink plenty of fluids, ok to continue Azo if needed. Start antibiotic - take for entire week.  Return to the clinic or go to the nearest emergency room if any of your symptoms worsen or new symptoms occur. Urinary Tract Infection Urinary tract infections (UTIs) can  develop anywhere along your urinary tract. Your urinary tract is your body's drainage system for removing wastes and extra water. Your urinary tract includes two kidneys, two ureters, a bladder, and a urethra. Your kidneys are a pair of bean-shaped organs. Each kidney is about the size of your fist. They are located below your ribs, one  on each side of your spine. CAUSES Infections are caused by microbes, which are microscopic organisms, including fungi, viruses, and bacteria. These organisms are so small that they can only be seen through a microscope. Bacteria are the microbes that most commonly cause UTIs. SYMPTOMS  Symptoms of UTIs may vary by age and gender of the patient and by the location of the infection. Symptoms in young women typically include a frequent and intense urge to urinate and a painful, burning feeling in the bladder or urethra during urination. Older women and men are more likely to be tired, shaky, and weak and have muscle aches and abdominal pain. A fever may mean the infection is in your kidneys. Other symptoms of a kidney infection include pain in your back or sides below the ribs, nausea, and vomiting. DIAGNOSIS To diagnose a UTI, your caregiver will ask you about your symptoms. Your caregiver also will ask to provide a urine sample. The urine sample will be tested for bacteria and white blood cells. White blood cells are made by your body to help fight infection. TREATMENT  Typically, UTIs can be treated with medication. Because most UTIs are caused by a bacterial infection, they usually can be treated with the use of antibiotics. The choice of antibiotic and length of treatment depend on your symptoms and the type of bacteria causing your infection. HOME CARE INSTRUCTIONS  If you were prescribed antibiotics, take them exactly as your caregiver instructs you. Finish the medication even if you feel better after you have only taken some of the medication.  Drink enough water  and fluids to keep your urine clear or pale yellow.  Avoid caffeine, tea, and carbonated beverages. They tend to irritate your bladder.  Empty your bladder often. Avoid holding urine for long periods of time.  Empty your bladder before and after sexual intercourse.  After a bowel movement, women should cleanse from front to back. Use each tissue only once. SEEK MEDICAL CARE IF:   You have back pain.  You develop a fever.  Your symptoms do not begin to resolve within 3 days. SEEK IMMEDIATE MEDICAL CARE IF:   You have severe back pain or lower abdominal pain.  You develop chills.  You have nausea or vomiting.  You have continued burning or discomfort with urination. MAKE SURE YOU:   Understand these instructions.  Will watch your condition.  Will get help right away if you are not doing well or get worse. Document Released: 08/15/2005 Document Revised: 05/06/2012 Document Reviewed: 12/14/2011 [facility] Patient Information 2014 [place], [place].

## 2013-08-06 NOTE — Patient Instructions (Signed)
Drink plenty of fluids, ok to continue Azo if needed. Start antibiotic - take for entire week.  Return to the clinic or go to the nearest emergency room if any of your symptoms worsen or new symptoms occur. Urinary Tract Infection Urinary tract infections (UTIs) can develop anywhere along your urinary tract. Your urinary tract is your body's drainage system for removing wastes and extra water. Your urinary tract includes two kidneys, two ureters, a bladder, and a urethra. Your kidneys are a pair of bean-shaped organs. Each kidney is about the size of your fist. They are located below your ribs, one on each side of your spine. CAUSES Infections are caused by microbes, which are microscopic organisms, including fungi, viruses, and bacteria. These organisms are so small that they can only be seen through a microscope. Bacteria are the microbes that most commonly cause UTIs. SYMPTOMS  Symptoms of UTIs may vary by age and gender of the patient and by the location of the infection. Symptoms in young women typically include a frequent and intense urge to urinate and a painful, burning feeling in the bladder or urethra during urination. Older women and men are more likely to be tired, shaky, and weak and have muscle aches and abdominal pain. A fever may mean the infection is in your kidneys. Other symptoms of a kidney infection include pain in your back or sides below the ribs, nausea, and vomiting. DIAGNOSIS To diagnose a UTI, your caregiver will ask you about your symptoms. Your caregiver also will ask to provide a urine sample. The urine sample will be tested for bacteria and white blood cells. White blood cells are made by your body to help fight infection. TREATMENT  Typically, UTIs can be treated with medication. Because most UTIs are caused by a bacterial infection, they usually can be treated with the use of antibiotics. The choice of antibiotic and length of treatment depend on your symptoms and the type  of bacteria causing your infection. HOME CARE INSTRUCTIONS  If you were prescribed antibiotics, take them exactly as your caregiver instructs you. Finish the medication even if you feel better after you have only taken some of the medication.  Drink enough water and fluids to keep your urine clear or pale yellow.  Avoid caffeine, tea, and carbonated beverages. They tend to irritate your bladder.  Empty your bladder often. Avoid holding urine for long periods of time.  Empty your bladder before and after sexual intercourse.  After a bowel movement, women should cleanse from front to back. Use each tissue only once. SEEK MEDICAL CARE IF:   You have back pain.  You develop a fever.  Your symptoms do not begin to resolve within 3 days. SEEK IMMEDIATE MEDICAL CARE IF:   You have severe back pain or lower abdominal pain.  You develop chills.  You have nausea or vomiting.  You have continued burning or discomfort with urination. MAKE SURE YOU:   Understand these instructions.  Will watch your condition.  Will get help right away if you are not doing well or get worse. Document Released: 08/15/2005 Document Revised: 05/06/2012 Document Reviewed: 12/14/2011 [facility] Patient Information 2014 [place], [place].

## 2013-08-07 NOTE — Progress Notes (Signed)
Left msg for pt to call for appt.

## 2013-08-08 LAB — URINE CULTURE: Colony Count: >=100000

## 2013-08-12 NOTE — Progress Notes (Signed)
Sent pt reminder letter to schedule future CPE.  

## 2013-08-18 ENCOUNTER — Encounter (INDEPENDENT_AMBULATORY_CARE_PROVIDER_SITE_OTHER): Payer: Self-pay

## 2013-08-25 ENCOUNTER — Other Ambulatory Visit: Payer: Self-pay

## 2013-08-25 DIAGNOSIS — Z1231 Encounter for screening mammogram for malignant neoplasm of breast: Secondary | ICD-10-CM

## 2013-08-28 ENCOUNTER — Ambulatory Visit (INDEPENDENT_AMBULATORY_CARE_PROVIDER_SITE_OTHER): Payer: BC Managed Care – PPO | Admitting: Family Medicine

## 2013-08-28 ENCOUNTER — Ambulatory Visit: Payer: BC Managed Care – PPO

## 2013-08-28 VITALS — BP 122/78 | HR 95 | Temp 97.6°F | Resp 16 | Ht 62.75 in | Wt 113.0 lb

## 2013-08-28 DIAGNOSIS — R0789 Other chest pain: Secondary | ICD-10-CM

## 2013-08-28 DIAGNOSIS — Z1239 Encounter for other screening for malignant neoplasm of breast: Secondary | ICD-10-CM

## 2013-08-28 DIAGNOSIS — Z8719 Personal history of other diseases of the digestive system: Secondary | ICD-10-CM

## 2013-08-28 DIAGNOSIS — R12 Heartburn: Secondary | ICD-10-CM

## 2013-08-28 DIAGNOSIS — R079 Chest pain, unspecified: Secondary | ICD-10-CM

## 2013-08-28 IMAGING — CR DG RIBS W/ CHEST 3+V*R*
2 series · 2 of 2 positions shown · non-contrast
Comparison: Chest radiograph [DATE]

CLINICAL DATA: Off and on pain under right breast, chest wall pain

EXAM:
RIGHT RIBS AND CHEST - 3+ VIEW

[AP]
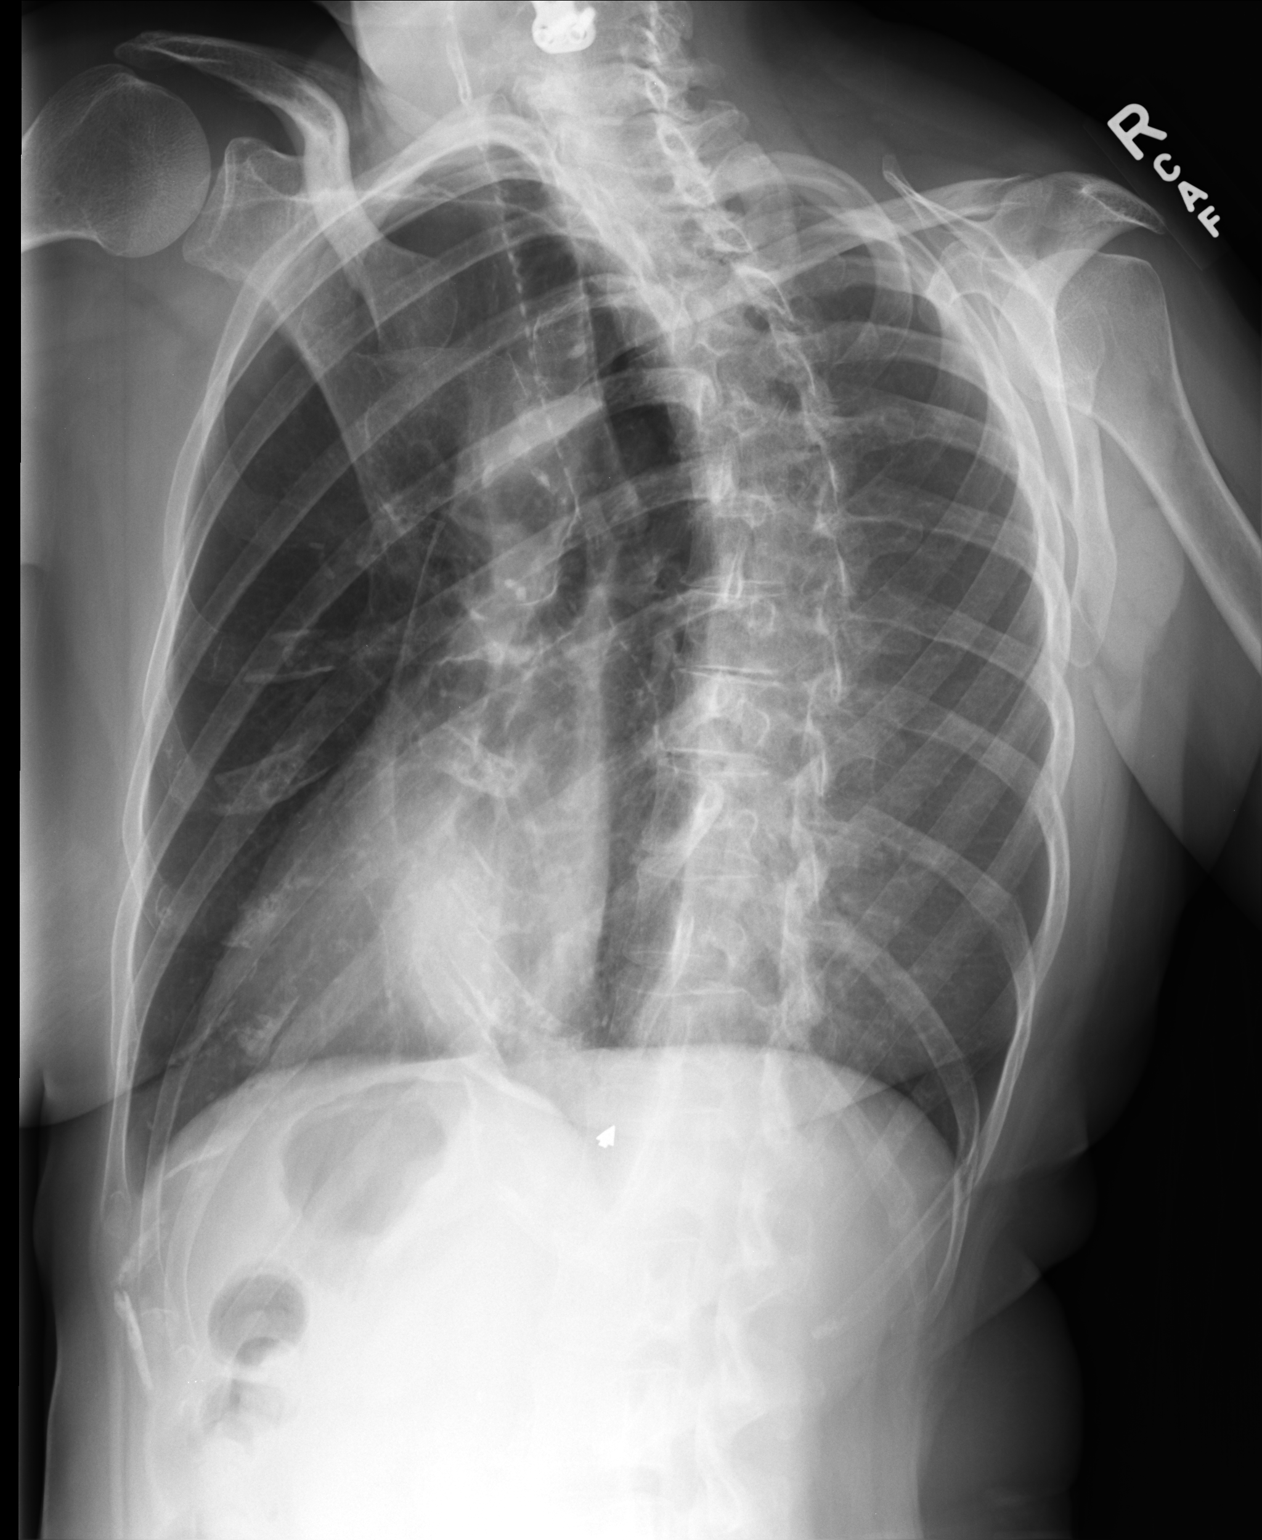

[rpo]
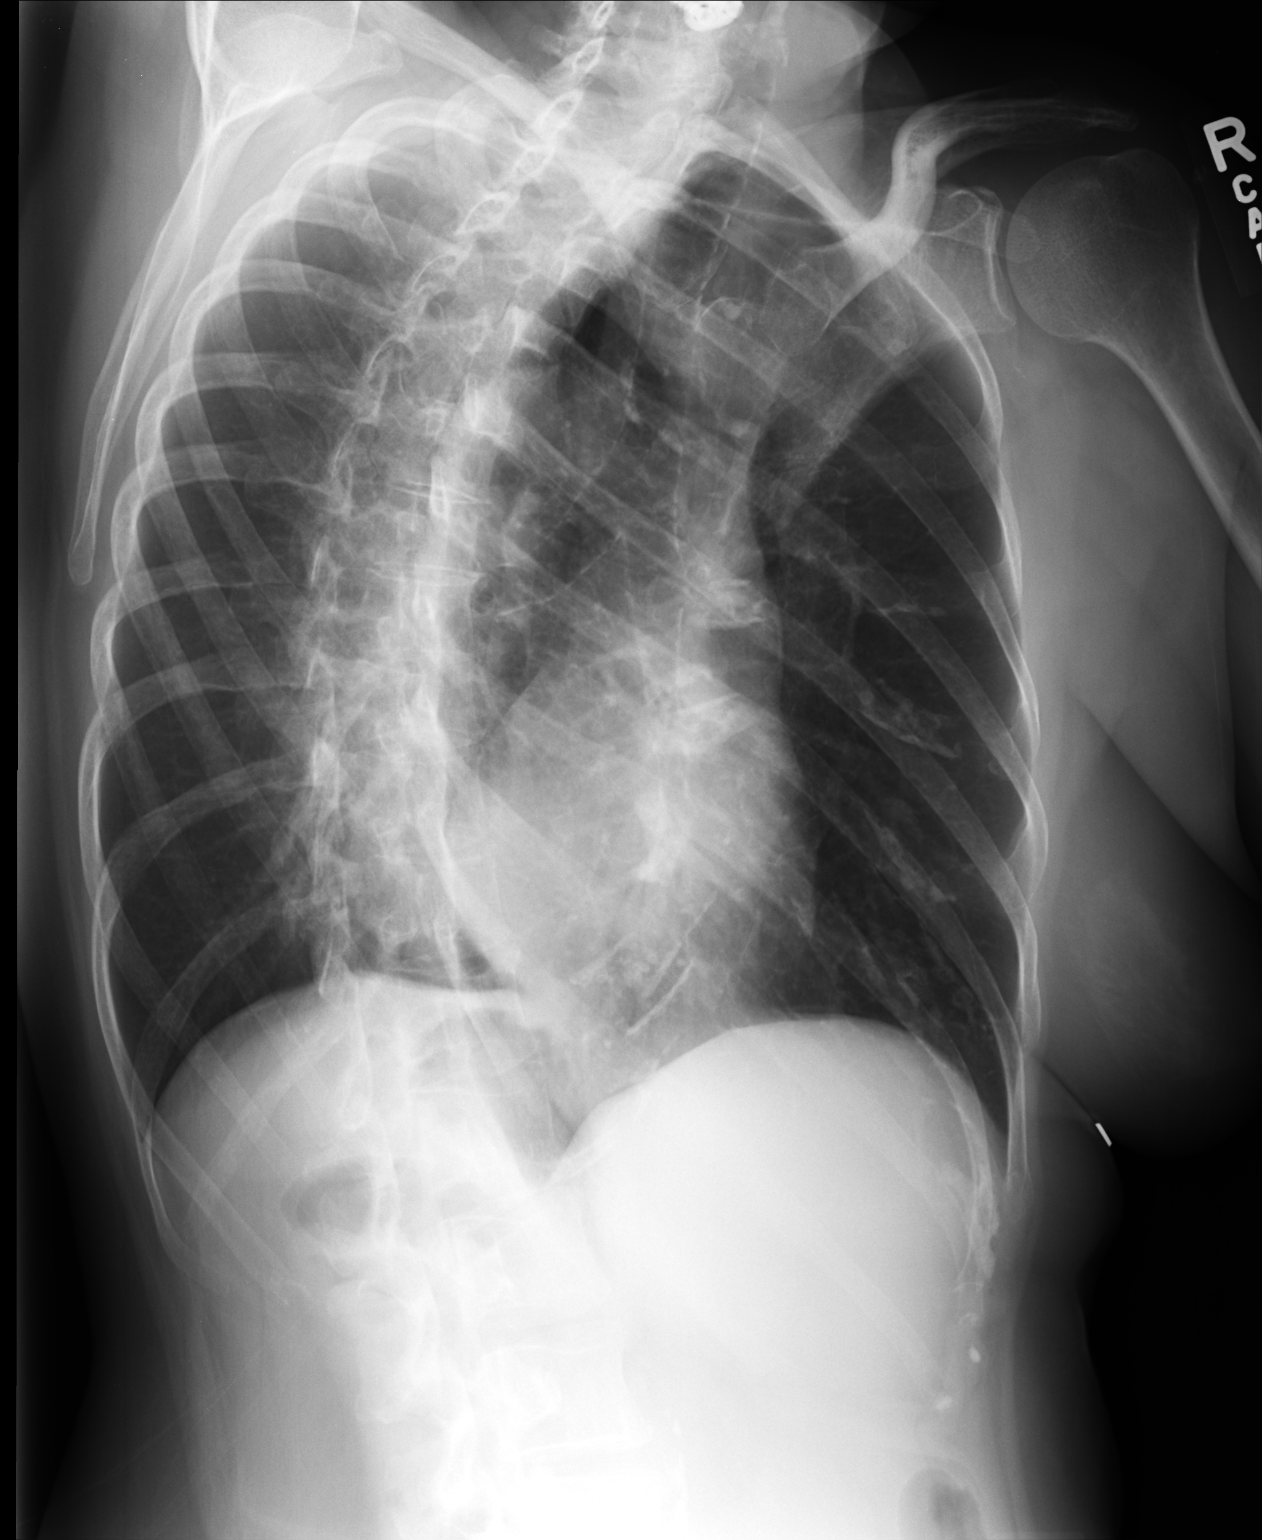

[2 of 2 positions shown; findings below may reference images not displayed]

FINDINGS: Steep oblique views.

Marker placed at site of symptoms.

Old fractures of the right 9th, 11th and 12th ribs.

No definite acute fractures identified.

No additional fracture, dislocation or bone destruction.
IMPRESSION: Old appearing lower right rib fractures.

No definite acute bony abnormalities.

## 2013-08-28 MED ORDER — ESOMEPRAZOLE MAGNESIUM 40 MG PO CPDR: 40.0000 mg | DELAYED_RELEASE_CAPSULE | Freq: Every day | ORAL | Status: DC

## 2013-08-28 NOTE — Patient Instructions (Addendum)
I placed an order for screening mammogram, but you need to return for a physical - can schedule this at 104 and select primary provider who is accepting new patients.  Restart nexium at 40mg  each day, avoid spicy foods, avoid aspirin or NSAIDS like ibuprofen or alleve, and limit alcohol intake as this can worsen or trigger esophagitis. If symptoms not continuing to improve into next week, may need to see gastroenterologist for further evaluation. Return to the clinic or go to the nearest emergency room if any of your symptoms worsen or new symptoms occur.  Tylenol if needed for chest wall pain, but if not improving in next 2 weeks, return to discuss further. This could be due to inflammation in the cartilege - "costochondritis"  As you declined blood tests and urine test today, I am unable to determine if you are anemic(low blood) or to check for infection as we discussed.  If you change your mind about these tests, or want to return after you have eaten, we can do the testing then.  If any lightheadedness, dark/tarry stools, vomiting - especially any coffee grounds, or any worsening of symptoms - you need to call 911 or go to an emergency room.    Esophagitis Esophagitis is inflammation of the esophagus. It can involve swelling, soreness, and pain in the esophagus. This condition can make it difficult and painful to swallow. CAUSES  Most causes of esophagitis are not serious. Many different factors can cause esophagitis, including:  Gastroesophageal reflux disease (GERD). This is when acid from your stomach flows up into the esophagus.  Recurrent vomiting.  An allergic-type reaction.  Certain medicines, especially those that come in large pills.  Ingestion of harmful chemicals, such as household cleaning products.  Heavy alcohol use.  An infection of the esophagus.  Radiation treatment for cancer.  Certain diseases such as sarcoidosis, Crohn's disease, and scleroderma. These diseases  may cause recurrent esophagitis. SYMPTOMS   Trouble swallowing.  Painful swallowing.  Chest pain.  Difficulty breathing.  Nausea.  Vomiting.  Abdominal pain. DIAGNOSIS  Your caregiver will take your history and do a physical exam. Depending upon what your caregiver finds, certain tests may also be done, including:  Barium X-ray. You will drink a solution that coats the esophagus, and X-rays will be taken.  Endoscopy. A lighted tube is put down the esophagus so your caregiver can examine the area.  Allergy tests. These can sometimes be arranged through follow-up visits. TREATMENT  Treatment will depend on the cause of your esophagitis. In some cases, steroids or other medicines may be given to help relieve your symptoms or to treat the underlying cause of your condition. Medicines that may be recommended include:  Viscous lidocaine, to soothe the esophagus.  Antacids.  Acid reducers.  Proton pump inhibitors.  Antiviral medicines for certain viral infections of the esophagus.  Antifungal medicines for certain fungal infections of the esophagus.  Antibiotic medicines, depending on the cause of the esophagitis. HOME CARE INSTRUCTIONS   Avoid foods and drinks that seem to make your symptoms worse.  Eat small, frequent meals instead of large meals.  Avoid eating for the 3 hours prior to your bedtime.  If you have trouble taking pills, use a pill splitter to decrease the size and likelihood of the pill getting stuck or injuring the esophagus on the way down. Drinking water after taking a pill also helps.  Stop smoking if you smoke.  Maintain a healthy weight.  Wear loose-fitting clothing. Do not  wear anything tight around your waist that causes pressure on your stomach.  Raise the head of your bed 6 to 8 inches with wood blocks to help you sleep. Extra pillows will not help.  Only take over-the-counter or prescription medicines as directed by your caregiver. SEEK  IMMEDIATE MEDICAL CARE IF:  You have severe chest pain that radiates into your arm, neck, or jaw.  You feel sweaty, dizzy, or lightheaded.  You have shortness of breath.  You vomit blood.  You have difficulty or pain with swallowing.  You have bloody or black, tarry stools.  You have a fever.  You have a burning sensation in the chest more than 3 times a week for more than 2 weeks.  You cannot swallow, drink, or eat.  You drool because you cannot swallow your saliva. MAKE SURE YOU:  Understand these instructions.  Will watch your condition.  Will get help right away if you are not doing well or get worse. Document Released: 12/13/2004 Document Revised: 01/28/2012 Document Reviewed: 07/06/2011 [facility] Patient Information 2014 [place], [place].

## 2013-08-28 NOTE — Progress Notes (Addendum)
This chart was scribed by Leone Payor, ED Scribe. This patient was seen in room Room 8 and the patient's care was started 9:38 AM.  Subjective:    Patient ID: Holly Phelps, female    DOB: July 01, 1961, 52 y.o.   MRN: [id number]  HPI  HPI Comments: Holly Phelps is a 52 y.o. female with a history of remission to hospital in March 2014 for acute pancreatitis and esophagitis, with hx alcohol abuse then.  who presents to [facility] complaining of heart burn beginning 4 nights ago to the center of chest. Pt states she felt associated nausea but denies vomiting. She states she feels some constant, gradually improving, central chest soreness since the onset of the heartburn. She reports taking an OTC ASA 500 mg and caffeine 32.5 mg pill prior to the onset of symptoms. Pt states she drinks a glass of wine 2-3 days per week, and did not drink any the day her heartburn symptoms began. She has tried OTC Nexium 24hr as needed for this heartburn. She takes it about once per week. She was taking the acid blocker that was prescribed since her hospital stay daily until about 1 month ago and now only takes it as needed. She states the chest soreness has been improving since onset. She denies chest heaviness or pressure. She denies radiation to the left shoulder or left arm. She denies abdominal pain, SOB, fever, chills, unexpected weight loss, cough.  She also complains of right chest wall pain that has been ongoing for a few months. She denies nipple discharge or lumps in the breast. She denies personal or family history of breast cancer.  She reports doing regular breast exams at home without feeling any abnormalities.      Past Medical History  Diagnosis Date  . Allergy   . Anemia   . 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 Hypertension   . Hypothyroidism   . GERD (gastroesophageal reflux disease)    Past Surgical History  Procedure Laterality Date  . Laparoscopic appendectomy    . Back surgery      x 2  .  Colonoscopy N/A 03/13/2013    Procedure: COLONOSCOPY;  Surgeon: Romie Levee, MD;  Location: [facility] ENDOSCOPY;  Service: Endoscopy;  Laterality: N/A;   Allergies  Allergen Reactions  . Codeine Nausea And Vomiting  . Flexeril [Cyclobenzaprine] Itching  . Sulfa Antibiotics     Pt does not recall what the reaction was,been too long  . Synthroid [Levothyroxine] Rash   Prior to Admission medications   Medication Sig Start Date End Date Taking? Authorizing Provider  Cranberry-Vitamin C-Probiotic (AZO CRANBERRY PO) Take by mouth.   Yes Historical Provider, MD  esomeprazole (NEXIUM) 40 MG capsule Take 40 mg by mouth daily before breakfast.   Yes Historical Provider, MD  Multiple Vitamin (MULTIVITAMIN WITH MINERALS) TABS Take 1 tablet by mouth daily. 01/29/13  Yes [name], MD  ciprofloxacin (CIPRO) 500 MG tablet Take 1 tablet (500 mg total) by mouth 2 (two) times daily. 08/06/13   Shade Flood, MD  lipase/protease/amylase (CREON-10/PANCREASE) 12000 UNITS CPEP Take 1 capsule by mouth 3 (three) times daily before meals. 01/29/13   [name], MD  thiamine (VITAMIN B-1) 100 MG tablet Take 100 mg by mouth daily.    Historical Provider, MD   History   Social History  . Marital Status: Married    Spouse Name: N/A    Number of Children: N/A  . Years of Education: N/A  Occupational History  . Not on file.   Social History Main Topics  . Smoking status: Current Every Day Smoker -- 0.50 packs/day    Types: Cigarettes  . Smokeless tobacco: Not on file     Comment: 3 cigarettes per day  . Alcohol Use: Yes     Comment: occasional  . Drug Use: No  . Sexual Activity: Yes   Other Topics Concern  . Not on file   Social History Narrative  . No narrative on file      Review of Systems  Constitutional: Negative for unexpected weight change.  Respiratory: Negative for chest tightness and shortness of breath.   Cardiovascular: Positive for chest pain (right sided and central).    Gastrointestinal: Positive for nausea. Negative for vomiting and abdominal pain.       Objective:   Physical Exam  Nursing note and vitals reviewed. Constitutional: She is oriented to person, place, and time. She appears well-developed and well-nourished.  HENT:  Head: Normocephalic and atraumatic.  Eyes: Conjunctivae and EOM are normal. Pupils are equal, round, and reactive to light.  Neck: Carotid bruit is not present.  Cardiovascular: Normal rate, regular rhythm, normal heart sounds and intact distal pulses.   Pulmonary/Chest: Effort normal and breath sounds normal. She exhibits no mass. Right breast exhibits no mass and no nipple discharge. Left breast exhibits no mass, no nipple discharge and no tenderness.    Right breast exam: no lumps, masses, nodules palpated. No nipple discharge. Left breast exam normal.  Abdominal: Soft. She exhibits no distension and no pulsatile midline mass. There is tenderness ( Minimal suprapubic tenderness to palpation. ). There is no rebound and no guarding.  Neurological: She is alert and oriented to person, place, and time.  Skin: Skin is warm and dry.  Psychiatric: She has a normal mood and affect. Her behavior is normal.    EKG: NSR, no acute findings.  [facility] reading (PRIMARY) by  Dr. Neva Seat: CXR and R rib series: no apparent fx, no acute findings.       Assessment & Plan:  Holly Phelps is a 52 y.o. female Screening for breast cancer - Plan: MM Digital Screening  Chest wall pain - Plan: EKG 12-Lead, DG Ribs Unilateral W/Chest Right, DG Chest 2 View  Heartburn - Plan: EKG 12-Lead, CANCELED: POCT CBC  Suspected esophagitis - hx of alcohol abuse, but denies recent heavy use. Discussed CBC to r/o anemia, as ddx inludes esophageal varicies but this and u/a refused d/t concern for vagal episodes when blood drawn.  I offered her crackers and juice prior and to have drawn supine, but also refused. She expressed understanding of risks of  undiagnosed anemia and infection, not limited to continued bleeding and possible death. Discussed alcohol avoidance, restart nexium 40mg  qd, avoid heartburn triggers and if not continuing to improve - recheck in next 1 week. Rtc/er sooner if any worsening or if she will agree to blood draw and other testing as discussed. Precautions given as below.   Chest wall pain - rib series done, without apparent fx. Possible costochondritis. tylenol if needed, avoid NSAIDS with current esophageal sx's.  MM scheduled for screening, but also discussed need for CPE. Recheck to discuss chest wall further if needed.    I personally performed the services described in this documentation, which was scribed in my presence. The recorded information has been reviewed and considered, and addended by me as needed.   Meds ordered this encounter  Medications  .  esomeprazole (NEXIUM) 40 MG capsule    Sig: Take 1 capsule (40 mg total) by mouth daily before breakfast.    Dispense:  30 capsule    Refill:  2   Patient Instructions  I placed an order for screening mammogram, but you need to return for a physical - can schedule this at 104 and select primary provider who is accepting new patients.  Restart nexium at 40mg  each day, avoid spicy foods, avoid aspirin or NSAIDS like ibuprofen or alleve, and limit alcohol intake as this can worsen or trigger esophagitis. If symptoms not continuing to improve into next week, may need to see gastroenterologist for further evaluation. Return to the clinic or go to the nearest emergency room if any of your symptoms worsen or new symptoms occur.  Tylenol if needed for chest wall pain, but if not improving in next 2 weeks, return to discuss further. This could be due to inflammation in the cartilege - "costochondritis"  As you declined blood tests and urine test today, I am unable to determine if you are anemic(low blood) or to check for infection as we discussed.  If you change your mind  about these tests, or want to return after you have eaten, we can do the testing then.  If any lightheadedness, dark/tarry stools, vomiting - especially any coffee grounds, or any worsening of symptoms - you need to call 911 or go to an emergency room.    Esophagitis Esophagitis is inflammation of the esophagus. It can involve swelling, soreness, and pain in the esophagus. This condition can make it difficult and painful to swallow. CAUSES  Most causes of esophagitis are not serious. Many different factors can cause esophagitis, including:  Gastroesophageal reflux disease (GERD). This is when acid from your stomach flows up into the esophagus.  Recurrent vomiting.  An allergic-type reaction.  Certain medicines, especially those that come in large pills.  Ingestion of harmful chemicals, such as household cleaning products.  Heavy alcohol use.  An infection of the esophagus.  Radiation treatment for cancer.  Certain diseases such as sarcoidosis, Crohn's disease, and scleroderma. These diseases may cause recurrent esophagitis. SYMPTOMS   Trouble swallowing.  Painful swallowing.  Chest pain.  Difficulty breathing.  Nausea.  Vomiting.  Abdominal pain. DIAGNOSIS  Your caregiver will take your history and do a physical exam. Depending upon what your caregiver finds, certain tests may also be done, including:  Barium X-ray. You will drink a solution that coats the esophagus, and X-rays will be taken.  Endoscopy. A lighted tube is put down the esophagus so your caregiver can examine the area.  Allergy tests. These can sometimes be arranged through follow-up visits. TREATMENT  Treatment will depend on the cause of your esophagitis. In some cases, steroids or other medicines may be given to help relieve your symptoms or to treat the underlying cause of your condition. Medicines that may be recommended include:  Viscous lidocaine, to soothe the esophagus.  Antacids.  Acid  reducers.  Proton pump inhibitors.  Antiviral medicines for certain viral infections of the esophagus.  Antifungal medicines for certain fungal infections of the esophagus.  Antibiotic medicines, depending on the cause of the esophagitis. HOME CARE INSTRUCTIONS   Avoid foods and drinks that seem to make your symptoms worse.  Eat small, frequent meals instead of large meals.  Avoid eating for the 3 hours prior to your bedtime.  If you have trouble taking pills, use a pill splitter to decrease the size and  likelihood of the pill getting stuck or injuring the esophagus on the way down. Drinking water after taking a pill also helps.  Stop smoking if you smoke.  Maintain a healthy weight.  Wear loose-fitting clothing. Do not wear anything tight around your waist that causes pressure on your stomach.  Raise the head of your bed 6 to 8 inches with wood blocks to help you sleep. Extra pillows will not help.  Only take over-the-counter or prescription medicines as directed by your caregiver. SEEK IMMEDIATE MEDICAL CARE IF:  You have severe chest pain that radiates into your arm, neck, or jaw.  You feel sweaty, dizzy, or lightheaded.  You have shortness of breath.  You vomit blood.  You have difficulty or pain with swallowing.  You have bloody or black, tarry stools.  You have a fever.  You have a burning sensation in the chest more than 3 times a week for more than 2 weeks.  You cannot swallow, drink, or eat.  You drool because you cannot swallow your saliva. MAKE SURE YOU:  Understand these instructions.  Will watch your condition.  Will get help right away if you are not doing well or get worse. Document Released: 12/13/2004 Document Revised: 01/28/2012 Document Reviewed: 07/06/2011 [facility] Patient Information 2014 [place], [place].

## 2013-10-04 ENCOUNTER — Telehealth: Payer: Self-pay

## 2013-10-04 NOTE — Telephone Encounter (Signed)
Message copied by [employer] Seeds on Sun Oct 04, 2013  1:46 PM ------      Message from: [place], [place] R      Created: Fri Oct 02, 2013 10:53 PM       Can we try to call again to make sure she received this message and recommendations? - JG ------

## 2013-10-04 NOTE — Telephone Encounter (Signed)
lmom to call back 

## 2013-10-07 ENCOUNTER — Encounter: Payer: Self-pay | Admitting: Radiology

## 2013-10-14 ENCOUNTER — Telehealth: Payer: Self-pay

## 2013-10-14 NOTE — Telephone Encounter (Signed)
Pt received a letter from [place] stating that she may need to schedule a bone density test. She needs more information About how to go about doing that. Please call [phone]

## 2013-10-14 NOTE — Telephone Encounter (Signed)
I do not see anything regarding her bone density but she does need a mammogram called her.  She indicates she has been out of town. She will call and get this set up soon. Please advise does she need a bone density?

## 2013-10-16 NOTE — Telephone Encounter (Signed)
I do not see a recommendation of a bone density in the office note or xray letter than was sent -- I do see that she needs to make an appt for mammogram - would you please review.

## 2013-10-16 NOTE — Telephone Encounter (Signed)
See xray report and instructions from 08/28/13 office visit:  Call pt - check status. On her last office visit - no acute abnormalities seen, but a few old rib fractures were noted. Does she remember an injury or fracture prior? May need bone density testing. Recommend recheck to discuss this further.  Recommended follow up to discuss these fractures, and to determine if bone density test needed due to multiple rib fractures and risk for osteoporosis. Letter was sent as unable to reach.

## 2013-10-19 NOTE — Telephone Encounter (Signed)
Left message for her to call me back. 

## 2013-10-22 NOTE — Telephone Encounter (Signed)
Left message, patient has not responded to calls .

## 2013-10-27 ENCOUNTER — Other Ambulatory Visit: Payer: Self-pay | Admitting: Radiology

## 2013-10-27 DIAGNOSIS — Z1239 Encounter for other screening for malignant neoplasm of breast: Secondary | ICD-10-CM

## 2013-11-04 NOTE — Telephone Encounter (Signed)
error 

## 2013-11-17 ENCOUNTER — Ambulatory Visit: Payer: BC Managed Care – PPO

## 2013-11-17 ENCOUNTER — Ambulatory Visit
Admission: RE | Admit: 2013-11-17 | Discharge: 2013-11-17 | Disposition: A | Discharge status: Home / Self Care | Payer: BC Managed Care – PPO | Source: Ambulatory Visit | Attending: Family Medicine | Admitting: Family Medicine

## 2013-11-17 DIAGNOSIS — Z1239 Encounter for other screening for malignant neoplasm of breast: Secondary | ICD-10-CM

## 2013-11-17 IMAGING — MG STANDARD SCREENING - COMBOHD
9 of 12 series · 9 of 28 positions shown · non-contrast
Comparison: None.

CLINICAL DATA: Screening.

EXAM:
DIGITAL SCREENING BILATERAL MAMMOGRAM WITH CAD
DIGITAL BREAST TOMOSYNTHESIS
Digital breast tomosynthesis images are acquired in two projections.
These images are reviewed in combination with the digital mammogram,
confirming the findings below.

[R CC]
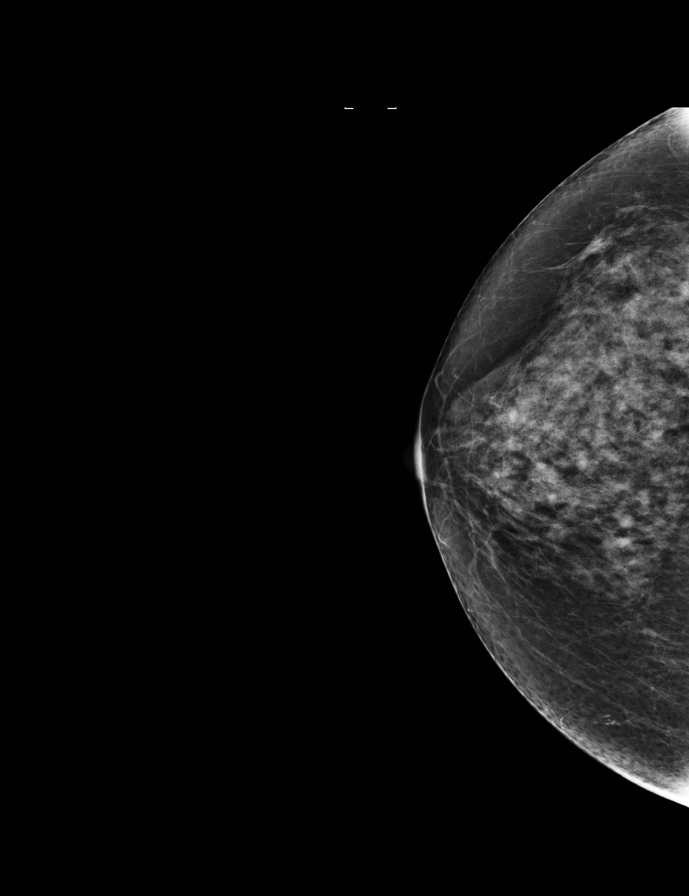

[R MLO]
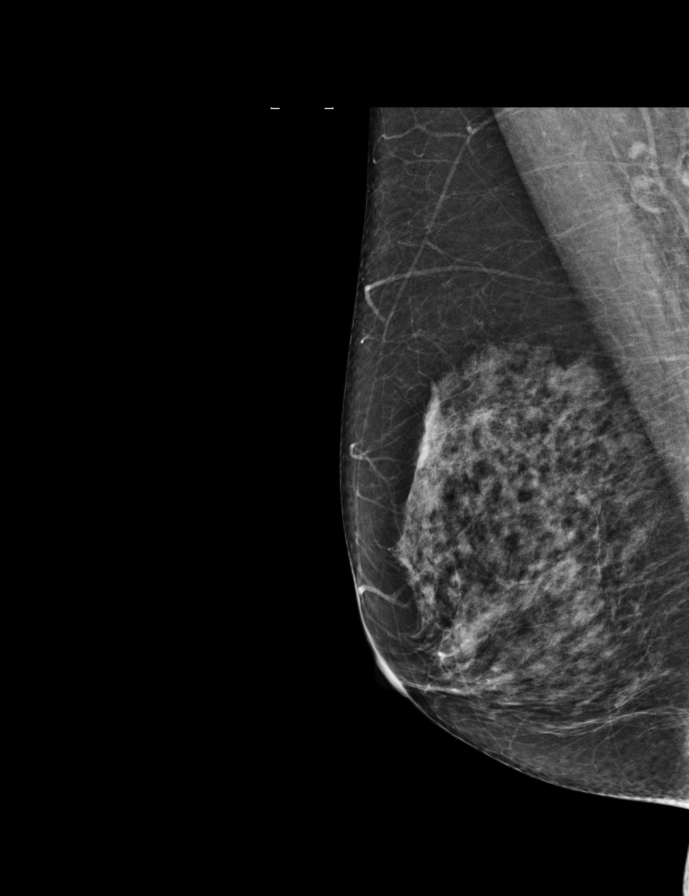

[L CC]
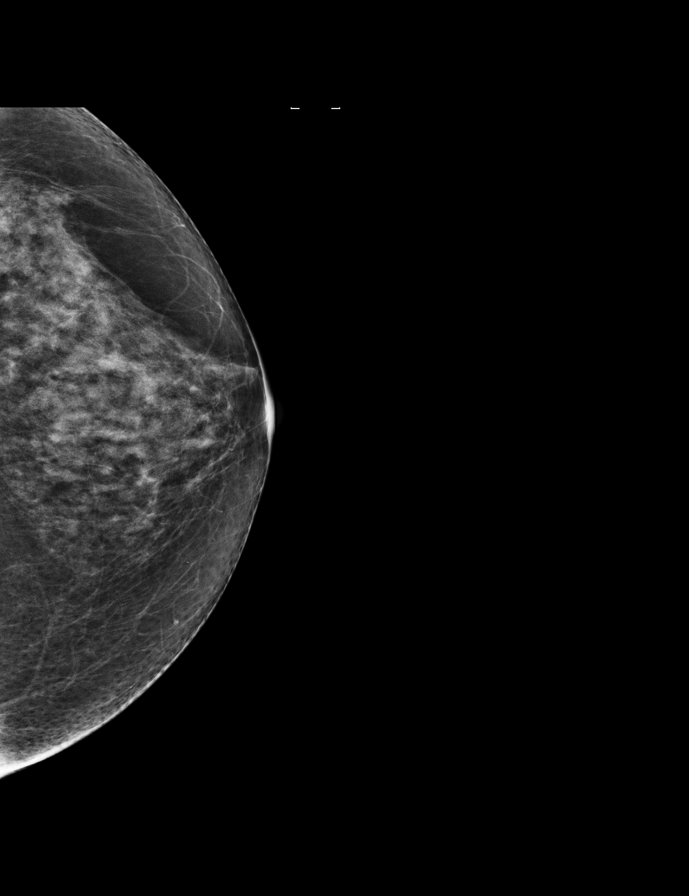

[L MLO]
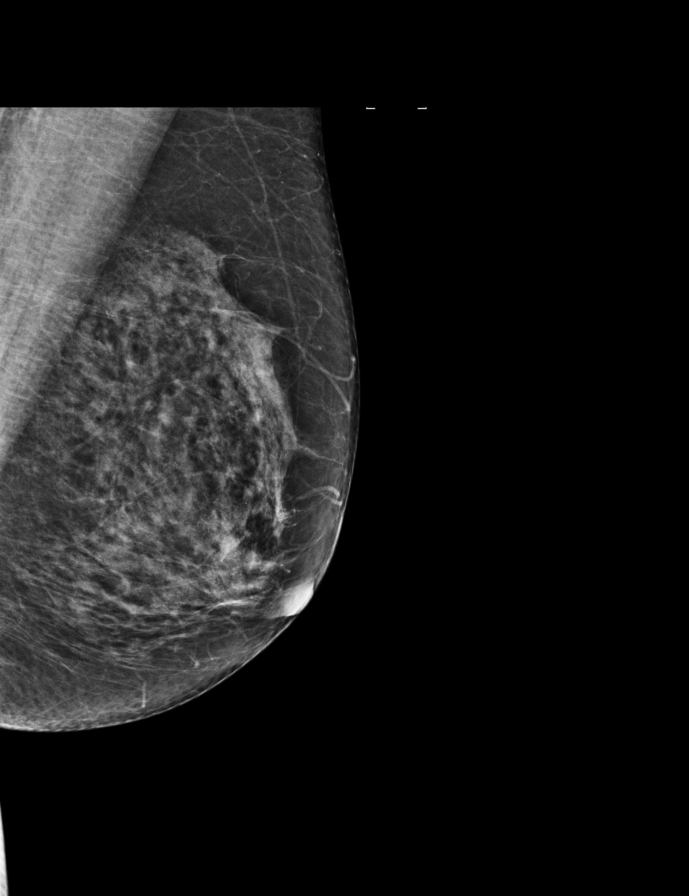

[L CC tomo · tomo slice 29/57.0]
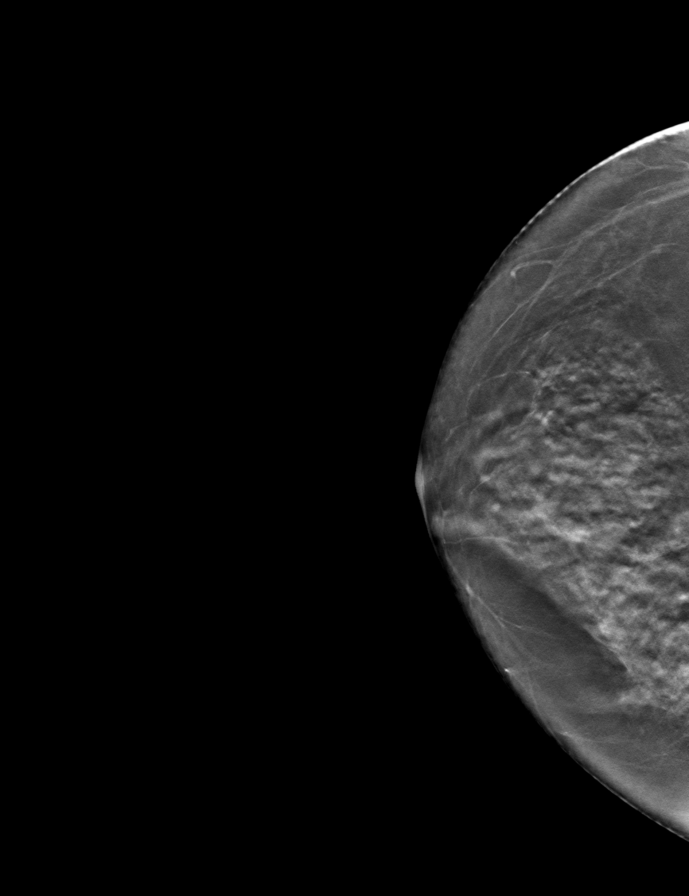

[R CC synth-2D]
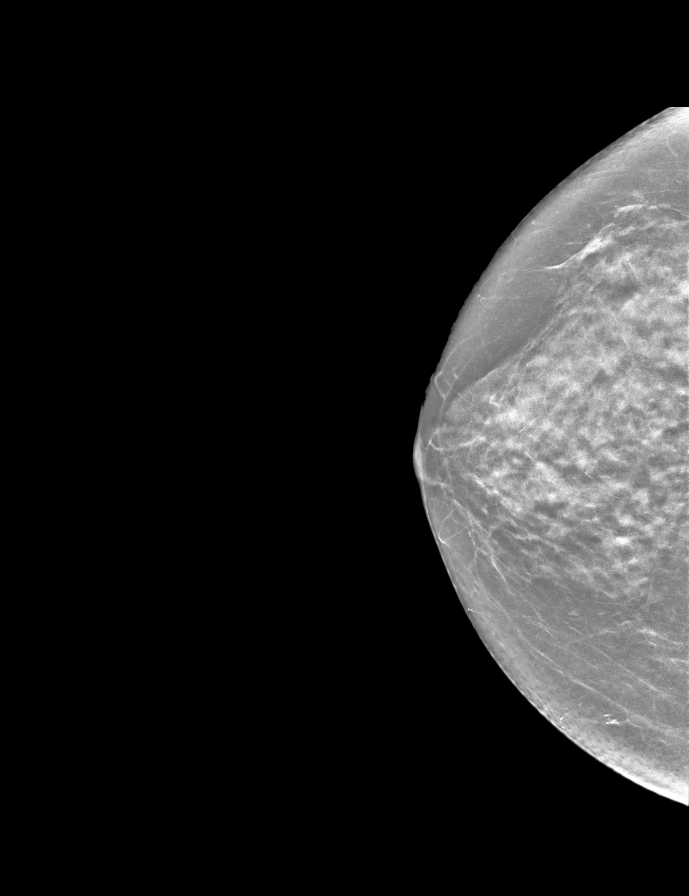

[L MLO synth-2D]
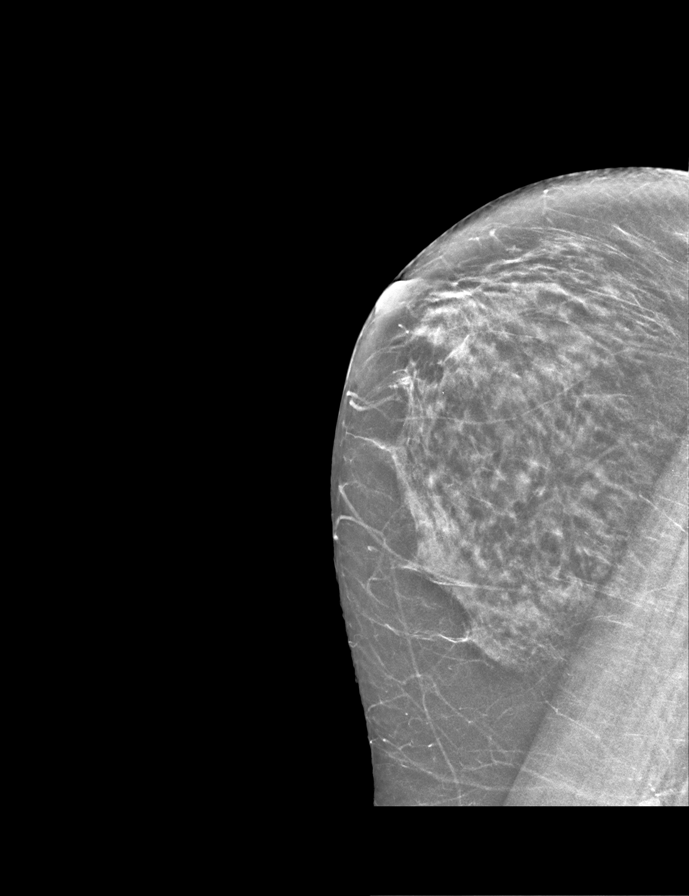

[R MLO synth-2D]
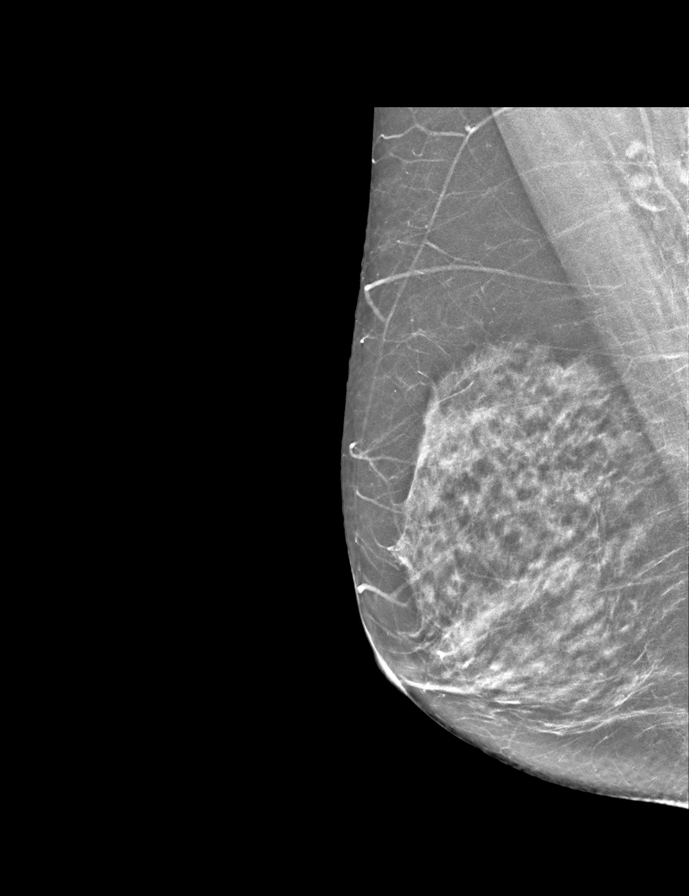

[L CC synth-2D]
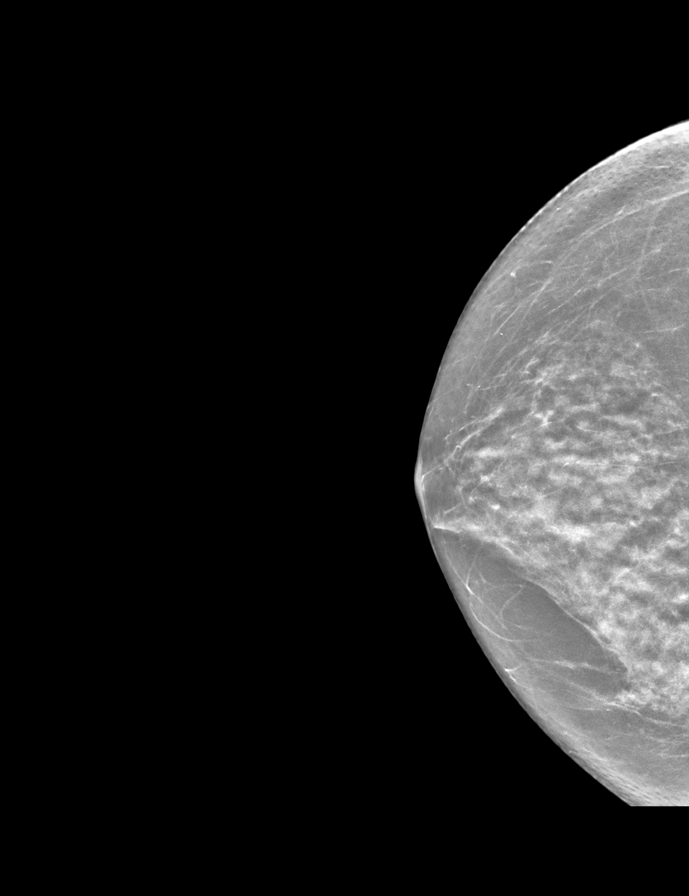

[9 of 28 positions shown; findings below may reference images not displayed]

ACR Breast Density Category c: The breasts are heterogeneously
dense, which may obscure small masses
FINDINGS: There are no findings suspicious for malignancy. Images were
processed with CAD.
IMPRESSION: No mammographic evidence of malignancy. A result letter of this
screening mammogram will be mailed directly to the patient.

RECOMMENDATION:
Screening mammogram in one year. (Code:[5L])

BI-RADS CATEGORY  1: Negative

## 2014-02-05 ENCOUNTER — Other Ambulatory Visit: Payer: Self-pay | Admitting: Family Medicine

## 2014-03-06 ENCOUNTER — Ambulatory Visit (INDEPENDENT_AMBULATORY_CARE_PROVIDER_SITE_OTHER): Payer: BC Managed Care – PPO | Admitting: Family Medicine

## 2014-03-06 VITALS — BP 118/78 | HR 96 | Temp 98.8°F | Resp 20 | Ht 62.0 in | Wt 113.8 lb

## 2014-03-06 DIAGNOSIS — K219 Gastro-esophageal reflux disease without esophagitis: Secondary | ICD-10-CM

## 2014-03-06 DIAGNOSIS — R112 Nausea with vomiting, unspecified: Secondary | ICD-10-CM

## 2014-03-06 DIAGNOSIS — K1120 Sialoadenitis, unspecified: Secondary | ICD-10-CM

## 2014-03-06 DIAGNOSIS — K859 Acute pancreatitis without necrosis or infection, unspecified: Secondary | ICD-10-CM

## 2014-03-06 DIAGNOSIS — R748 Abnormal levels of other serum enzymes: Secondary | ICD-10-CM

## 2014-03-06 DIAGNOSIS — R109 Unspecified abdominal pain: Secondary | ICD-10-CM

## 2014-03-06 LAB — COMPREHENSIVE METABOLIC PANEL
ALT: 91 U/L — ABNORMAL HIGH (ref 0–35)
AST: 120 U/L — ABNORMAL HIGH (ref 0–37)
Albumin: 4.7 g/dL (ref 3.5–5.2)
Alkaline Phosphatase: 142 U/L — ABNORMAL HIGH (ref 39–117)
BUN: 19 mg/dL (ref 6–23)
CO2: 27 mEq/L (ref 19–32)
Calcium: 9.7 mg/dL (ref 8.4–10.5)
Chloride: 92 mEq/L — ABNORMAL LOW (ref 96–112)
Creat: 0.72 mg/dL (ref 0.50–1.10)
Glucose, Bld: 99 mg/dL (ref 70–99)
Potassium: 3.6 mEq/L (ref 3.5–5.3)
Sodium: 136 mEq/L (ref 135–145)
Total Bilirubin: 0.9 mg/dL (ref 0.2–1.2)
Total Protein: 7.8 g/dL (ref 6.0–8.3)

## 2014-03-06 LAB — POCT CBC
Granulocyte percent: 71.2 %G (ref 37–80)
HCT, POC: 37.8 % (ref 37.7–47.9)
Hemoglobin: 12.3 g/dL (ref 12.2–16.2)
Lymph, poc: 1.8 (ref 0.6–3.4)
MCH, POC: 33.7 pg — AB (ref 27–31.2)
MCHC: 32.5 g/dL (ref 31.8–35.4)
MCV: 103.5 fL — AB (ref 80–97)
MID (cbc): 1.1 — AB (ref 0–0.9)
MPV: 8.1 fL (ref 0–99.8)
POC Granulocyte: 7.3 — AB (ref 2–6.9)
POC LYMPH PERCENT: 17.8 %L (ref 10–50)
POC MID %: 11 %M (ref 0–12)
Platelet Count, POC: 348 10*3/uL (ref 142–424)
RBC: 3.65 M/uL — AB (ref 4.04–5.48)
RDW, POC: 15 %
WBC: 10.3 10*3/uL — AB (ref 4.6–10.2)

## 2014-03-06 LAB — AMYLASE: Amylase: 1027 U/L — ABNORMAL HIGH (ref 0–105)

## 2014-03-06 LAB — LIPASE: Lipase: <10 U/L (ref 0–75)

## 2014-03-06 MED ORDER — ESOMEPRAZOLE MAGNESIUM 40 MG PO CPDR: 40.0000 mg | DELAYED_RELEASE_CAPSULE | Freq: Every day | ORAL | Status: DC

## 2014-03-06 MED ORDER — ONDANSETRON HCL 8 MG PO TABS: 8.0000 mg | ORAL_TABLET | Freq: Three times a day (TID) | ORAL | Status: DC | PRN

## 2014-03-06 MED ORDER — AMOXICILLIN 500 MG PO CAPS: 1000.0000 mg | ORAL_CAPSULE | Freq: Two times a day (BID) | ORAL | Status: DC

## 2014-03-06 NOTE — Patient Instructions (Addendum)
I will be in touch with the rest of your labs.  It is possible that you have mumps, so avoid being around too many people until we get your tests back.  Use the zofran as needed for nausea.  Take the amoxicillin for possible infection in your parotic gland.    Use tylenol and /or ibuprofen for your pain.    If you are not feeling better or start feeling worse please let me know!

## 2014-03-06 NOTE — Progress Notes (Addendum)
[facility] [address], [place] [address] [phone]  Date:  03/06/2014   Name:  Holly Phelps   DOB:  1961-03-29   MRN:  [id number]  PCP:  Kennon Portela, MD    Chief Complaint: Emesis and Nausea   History of Present Illness:  Holly Phelps is a 53 y.o. very pleasant female patient who presents with the following:  Last night she had nausea and vomiting. She did not sleep well due to vomiting during the night.  She also notes a "knot" in front of her left ear which is painful.  It hurts to chew.  No diarrhea She thinks it "may have been something I ate."  She has not vomited yet today She had a BM today but is was normal She does not note any abdominal pain She has not noted a fever She did trink some water and a little melon today- she could not eat due to the pain in her jaw, but she drank water ok.    She did have 2 glasses of wine yesterday. She has a history of pancreatitis and alcohol abuse- she does not feel that this is pancreatitis now  Patient Active Problem List   Diagnosis Date Noted  . Pancreatitis 01/29/2013  . Hyponatremia 01/29/2013  . Anemia 01/29/2013  . Hypokalemia 01/29/2013  . Nausea vomiting and diarrhea 01/26/2013  . Dehydration 01/26/2013  . Esophagitis 01/26/2013  . Elevated lipase 01/26/2013  . HYPOTHYROIDISM 03/01/2008  . ANXIETY 03/01/2008  . ALCOHOL ABUSE 03/01/2008  . HYPERTENSION 03/01/2008  . LIVER FUNCTION TESTS, ABNORMAL, HX OF 03/01/2008    Past Medical History  Diagnosis Date  . Allergy   . Anemia   . 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 Hypertension   . Hypothyroidism   . GERD (gastroesophageal reflux disease)     Past Surgical History  Procedure Laterality Date  . Laparoscopic appendectomy    . Back surgery      x 2  . Colonoscopy N/A 03/13/2013    Procedure: COLONOSCOPY;  Surgeon: Leighton Ruff, MD;  Location: [facility];  Service: Endoscopy;  Laterality: N/A;    History  Substance  Use Topics  . Smoking status: Current Every Day Smoker -- 0.50 packs/day    Types: Cigarettes  . Smokeless tobacco: Not on file     Comment: 3 cigarettes per day  . Alcohol Use: Yes     Comment: occasional    Family History  Problem Relation Age of Onset  . Stroke Father   . Stroke Brother   . Stroke Brother     Allergies  Allergen Reactions  . Codeine Nausea And Vomiting  . Flexeril [Cyclobenzaprine] Itching  . Sulfa Antibiotics     Pt does not recall what the reaction was,been too long  . Synthroid [Levothyroxine] Rash    Medication list has been reviewed and updated.  Current Outpatient Prescriptions on File Prior to Visit  Medication Sig Dispense Refill  . esomeprazole (NEXIUM) 40 MG capsule Take 1 capsule (40 mg total) by mouth daily. PATIENT NEEDS OFFICE VISIT FOR ADDITIONAL REFILLS  30 capsule  0  . Multiple Vitamin (MULTIVITAMIN WITH MINERALS) TABS Take 1 tablet by mouth daily.  30 tablet  0  . thiamine (VITAMIN B-1) 100 MG tablet Take 100 mg by mouth daily.      . ciprofloxacin (CIPRO) 500 MG tablet Take 1 tablet (500 mg total) by mouth 2 (two) times daily.  14 tablet  0  . Cranberry-Vitamin C-Probiotic (AZO CRANBERRY PO) Take by mouth.      . lipase/protease/amylase (CREON-10/PANCREASE) 12000 UNITS CPEP Take 1 capsule by mouth 3 (three) times daily before meals.  270 capsule  0   No current facility-administered medications on file prior to visit.    Review of Systems:  As per HPI- otherwise negative.   Physical Examination: Filed Vitals:   03/06/14 1055  BP: 118/78  Pulse: 123  Temp: 98.8 F (37.1 C)  Resp: 20   Filed Vitals:   03/06/14 1055  Height: 5\' 2"  (1.575 m)  Weight: 113 lb 12.8 oz (51.619 kg)   Body mass index is 20.81 kg/(m^2). Ideal Body Weight: Weight in (lb) to have BMI = 25: 136.4  GEN: WDWN, NAD, Non-toxic, A & O x 3, looks well, slim build HEENT: Atraumatic, Normocephalic. Neck supple. No masses, No LAD.  Bilateral TM wnl,  oropharynx normal.  PEERL,EOMI.   Tender left parotid gland.  Teeth are non- tender, no evidence of dental abscess.  Ears and Nose: No external deformity. CV: RRR, No M/G/R. No JVD. No thrill. No extra heart sounds. PULM: CTA B, no wheezes, crackles, rhonchi. No retractions. No resp. distress. No accessory muscle use. ABD: S, NT, ND, +BS. No rebound. No HSM.  Benign exam EXTR: No c/c/e NEURO Normal gait.  PSYCH: Normally interactive. Conversant. Not depressed or anxious appearing.  Calm demeanor.   Results for orders placed in visit on 03/06/14  POCT CBC      Result Value Ref Range   WBC 10.3 (*) 4.6 - 10.2 K/uL   Lymph, poc 1.8  0.6 - 3.4   POC LYMPH PERCENT 17.8  10 - 50 %L   MID (cbc) 1.1 (*) 0 - 0.9   POC MID % 11.0  0 - 12 %M   POC Granulocyte 7.3 (*) 2 - 6.9   Granulocyte percent 71.2  37 - 80 %G   RBC 3.65 (*) 4.04 - 5.48 M/uL   Hemoglobin 12.3  12.2 - 16.2 g/dL   HCT, POC 37.8  37.7 - 47.9 %   MCV 103.5 (*) 80 - 97 fL   MCH, POC 33.7 (*) 27 - 31.2 pg   MCHC 32.5  31.8 - 35.4 g/dL   RDW, POC 15.0     Platelet Count, POC 348  142 - 424 K/uL   MPV 8.1  0 - 99.8 fL   Placed IV for hydration- removed after 860ml as she felt very cold.    Assessment and Plan: Nausea and vomiting - Plan: Comprehensive metabolic panel, POCT CBC, Amylase, Lipase, ondansetron (ZOFRAN) 8 MG tablet  Parotiditis - Plan: Mumps antibody, IgM, amoxicillin (AMOXIL) 500 MG capsule  Nausea and vomiting.  Now resolved.  No diarrhea.  May be food borne, check labs as above.  zofran as needed  parotiditis: check mumps IgM and will treat with amoxicillin in the meantime. Encouraged sour candies for salivation   Signed Lamar Blinks, MD  Called around 10pm with labs.  LMOM on her cell, and spoke to her husband as she is asleep.  Her amylase is very elevated- this may indicate acute pancreatitis.  I would advise them to go to the ER for further hydration and monitoring.  If they elect not to do this  please come in for a recheck tomorrow

## 2014-03-07 ENCOUNTER — Ambulatory Visit (INDEPENDENT_AMBULATORY_CARE_PROVIDER_SITE_OTHER): Payer: BC Managed Care – PPO | Admitting: Family Medicine

## 2014-03-07 ENCOUNTER — Other Ambulatory Visit: Payer: Self-pay | Admitting: Family Medicine

## 2014-03-07 VITALS — BP 120/70 | HR 95 | Temp 97.8°F | Resp 18 | Ht 62.0 in | Wt 115.0 lb

## 2014-03-07 DIAGNOSIS — K859 Acute pancreatitis without necrosis or infection, unspecified: Secondary | ICD-10-CM

## 2014-03-07 DIAGNOSIS — R748 Abnormal levels of other serum enzymes: Secondary | ICD-10-CM

## 2014-03-07 DIAGNOSIS — R109 Unspecified abdominal pain: Secondary | ICD-10-CM

## 2014-03-07 LAB — POCT CBC
Granulocyte percent: 70.4 %G (ref 37–80)
HCT, POC: 31.2 % — AB (ref 37.7–47.9)
Hemoglobin: 9.8 g/dL — AB (ref 12.2–16.2)
Lymph, poc: 1.5 (ref 0.6–3.4)
MCH, POC: 32.9 pg — AB (ref 27–31.2)
MCHC: 31.4 g/dL — AB (ref 31.8–35.4)
MCV: 104.8 fL — AB (ref 80–97)
MID (cbc): 0.8 (ref 0–0.9)
MPV: 8.7 fL (ref 0–99.8)
POC Granulocyte: 5.4 (ref 2–6.9)
POC LYMPH PERCENT: 19.7 %L (ref 10–50)
POC MID %: 9.9 %M (ref 0–12)
Platelet Count, POC: 259 10*3/uL (ref 142–424)
RBC: 2.98 M/uL — AB (ref 4.04–5.48)
RDW, POC: 14.8 %
WBC: 7.6 10*3/uL (ref 4.6–10.2)

## 2014-03-07 LAB — COMPREHENSIVE METABOLIC PANEL
ALT: 67 U/L — ABNORMAL HIGH (ref 0–35)
AST: 91 U/L — ABNORMAL HIGH (ref 0–37)
Albumin: 4.2 g/dL (ref 3.5–5.2)
Alkaline Phosphatase: 126 U/L — ABNORMAL HIGH (ref 39–117)
BUN: 12 mg/dL (ref 6–23)
CO2: 26 mEq/L (ref 19–32)
Calcium: 9.3 mg/dL (ref 8.4–10.5)
Chloride: 99 mEq/L (ref 96–112)
Creat: 0.66 mg/dL (ref 0.50–1.10)
Glucose, Bld: 87 mg/dL (ref 70–99)
Potassium: 4 mEq/L (ref 3.5–5.3)
Sodium: 138 mEq/L (ref 135–145)
Total Bilirubin: 0.8 mg/dL (ref 0.2–1.2)
Total Protein: 6.8 g/dL (ref 6.0–8.3)

## 2014-03-07 LAB — HEPATITIS PANEL, ACUTE
HCV Ab: NEGATIVE
Hep A IgM: NONREACTIVE
Hep B C IgM: NONREACTIVE
Hepatitis B Surface Ag: NEGATIVE

## 2014-03-07 LAB — LIPASE: Lipase: 11 U/L (ref 0–75)

## 2014-03-07 LAB — AMYLASE: Amylase: 852 U/L — ABNORMAL HIGH (ref 0–105)

## 2014-03-07 NOTE — Progress Notes (Signed)
Chief Complaint:  Chief Complaint  Patient presents with  . Pancreatitis    yesterday OV with labs, called last night to RTC due to pancreatitis    HPI: Holly Phelps is a 53 y.o. female who is here for  For abnormal pancreatic enzymes, lipase was normal but amylase was high. She was told to go to ER or return to office to get rechecked.She chose to return to office.  Her husband told her DR COpland had called her with results but she was fast asleep, he told her this morning. She is actually doing so much better except for her left sided parotid gland is still swollen.  Her abd pain is resolved, she got a good night sleep. She denies nausea or vomiting. She is hungry. Denies fevers, chills. Oral intake has been normal.   She has not taken in any alcohol since yesterday. She tells me that she drinks daily 1-2 drinks a day with her dinner She was seeing Dr Benson Norway but does not want to return to see him for various reasons, she has had colonscopy and EGD, EGD showed gastritis She also has had CT scan in 2014, see resutls below. Pancreas was normal at that time but did have esophageal thickening and fatty liver She has high cholesterol but due to history of elevated lier enzymes cannot take statins She has never has had a hepatitis workup She has only 1 episode of pancreatitis that she knows of, she was on Pancrease at that time but no longer.   Findings: Small sliding hiatal hernia. Diffuse distal esophageal  wall thickening with a maximum thickness of the 9 mm.  Diffuse low density of the liver relative to the spleen. Better  visualized small cyst in the midportion of the right kidney,  measuring 4 mm in diameter on image number 18 of series 7.  Unremarkable left kidney, ureters and urinary bladder. No urinary  tract calculi or hydronephrosis seen.  Unremarkable spleen, pancreas, gallbladder, adrenal glands, uterus  and ovaries. No intestinal abnormalities or enlarged lymph nodes.   Atheromatous arterial calcifications without aneurysm or  dissection. Minimal dependent atelectasis at the right lung base.  Minimal lumbar spine degenerative changes.  IMPRESSION:  1. Moderate diffuse distal esophageal wall thickening, most likely  due to esophagitis. A concentric neoplasm is less likely.  Correlation with the recent endoscopy findings is necessary.  2. Small sliding hiatal hernia.  3. Diffuse hepatic steatosis.    Past Medical History  Diagnosis Date  . Allergy   . Anemia   . 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 Hypertension   . Hypothyroidism   . GERD (gastroesophageal reflux disease)    Past Surgical History  Procedure Laterality Date  . Laparoscopic appendectomy    . Back surgery      x 2  . Colonoscopy N/A 03/13/2013    Procedure: COLONOSCOPY;  Surgeon: Leighton Ruff, MD;  Location: [facility];  Service: Endoscopy;  Laterality: N/A;   History   Social History  . Marital Status: Married    Spouse Name: N/A    Number of Children: N/A  . Years of Education: N/A   Social History Main Topics  . Smoking status: Current Every Day Smoker -- 0.50 packs/day    Types: Cigarettes  . Smokeless tobacco: None     Comment: 3 cigarettes per day  . Alcohol Use: Yes     Comment: occasional  . Drug Use: No  . Sexual  Activity: Yes   Other Topics Concern  . None   Social History Narrative  . None   Family History  Problem Relation Age of Onset  . Stroke Father   . Stroke Brother   . Stroke Brother    Allergies  Allergen Reactions  . Codeine Nausea And Vomiting  . Flexeril [Cyclobenzaprine] Itching  . Sulfa Antibiotics     Pt does not recall what the reaction was,been too long  . Synthroid [Levothyroxine] Rash   Prior to Admission medications   Medication Sig Start Date End Date Taking? Authorizing Provider  amoxicillin (AMOXIL) 500 MG capsule Take 2 capsules (1,000 mg total) by mouth 2 (two) times daily. 03/06/14  Yes Gay Filler Copland, MD    esomeprazole (NEXIUM) 40 MG capsule Take 1 capsule (40 mg total) by mouth daily. 03/06/14  Yes Gay Filler Copland, MD  Multiple Vitamin (MULTIVITAMIN WITH MINERALS) TABS Take 1 tablet by mouth daily. 01/29/13  Yes [name], MD  ondansetron (ZOFRAN) 8 MG tablet Take 1 tablet (8 mg total) by mouth every 8 (eight) hours as needed for nausea or vomiting. 03/06/14  Yes Gay Filler Copland, MD  Cranberry-Vitamin C-Probiotic (AZO CRANBERRY PO) Take by mouth.    Historical Provider, MD  lipase/protease/amylase (CREON-10/PANCREASE) 12000 UNITS CPEP Take 1 capsule by mouth 3 (three) times daily before meals. 01/29/13   [name], MD  thiamine (VITAMIN B-1) 100 MG tablet Take 100 mg by mouth daily.    Historical Provider, MD     ROS: The patient denies fevers, chills, night sweats, unintentional weight loss, chest pain, palpitations, wheezing, dyspnea on exertion, nausea, vomiting, abdominal pain, dysuria, hematuria, melena, numbness, weakness, or tingling.   All other systems have been reviewed and were otherwise negative with the exception of those mentioned in the HPI and as above.    PHYSICAL EXAM: Filed Vitals:   03/07/14 0848  BP: 120/70  Pulse: 95  Temp: 97.8 F (36.6 C)  Resp: 18  Spo2 100% Filed Vitals:   03/07/14 0848  Height: 5\' 2"  (1.575 m)  Weight: 115 lb (52.164 kg)   Body mass index is 21.03 kg/(m^2).  General: Alert, no acute distress, spry but looks much older than stated age  60:  Normocephalic, atraumatic, oropharynx patent. EOMI, PERRLA Cardiovascular:  Regular rate and rhythm, no rubs murmurs or gallops.  No Carotid bruits, radial pulse intact. No pedal edema.  Respiratory: Clear to auscultation bilaterally.  No wheezes, rales, or rhonchi.  No cyanosis, no use of accessory musculature GI: No organomegaly, abdomen is soft and non-tender, positive bowel sounds.  No masses. Skin: No rashes. Neurologic: Facial musculature symmetric. Psychiatric: Patient is  appropriate throughout our interaction. Lymphatic: No cervical lymphadenopathy, + left parotid gland swelling Musculoskeletal: Gait intact.   LABS: Results for orders placed in visit on 03/07/14  POCT CBC      Result Value Ref Range   WBC 7.6  4.6 - 10.2 K/uL   Lymph, poc 1.5  0.6 - 3.4   POC LYMPH PERCENT 19.7  10 - 50 %L   MID (cbc) 0.8  0 - 0.9   POC MID % 9.9  0 - 12 %M   POC Granulocyte 5.4  2 - 6.9   Granulocyte percent 70.4  37 - 80 %G   RBC 2.98 (*) 4.04 - 5.48 M/uL   Hemoglobin 9.8 (*) 12.2 - 16.2 g/dL   HCT, POC 31.2 (*) 37.7 - 47.9 %   MCV 104.8 (*) 80 - 97  fL   MCH, POC 32.9 (*) 27 - 31.2 pg   MCHC 31.4 (*) 31.8 - 35.4 g/dL   RDW, POC 14.8     Platelet Count, POC 259  142 - 424 K/uL   MPV 8.7  0 - 99.8 fL     EKG/XRAY:   Primary read interpreted by Dr. Marin Comment at [facility].   ASSESSMENT/PLAN: Encounter Diagnoses  Name Primary?  . Pancreatitis Yes  . Elevated amylase    52 y.o female with PMH of prior alcohol abuse/dependence, pancreatitis , elevated liver enzymes who presents here for recheck of  elevated amylase but normal lipase yesterday's office visit   Clinically she is improved, She is completely asymptomatic today, able to tolerate PO intake and taking in fluids Will get repeat labs, currently CBC is WNL but HGb is low due to multiple blood draws in 24 ours  and possible IVF yesterday and fluid push at home If amylase cont to be high then consider out patient , inpatient workup. Will send to ER prn based on labs I am assuming her amylase is elevated and lipase is normal because she may have a parotitis  I also suspect she is drinkingmore than she is stating.  Stat labs pending: Amylase, Lipase, CMP   Gross sideeffects, risk and benefits, and alternatives of medications d/w patient. Patient is aware that all medications have potential sideeffects and we are unable to predict every sideeffect or drug-drug interaction that may occur.  Glenford Bayley,  DO 03/07/2014 12:16 PM  03/07/14 @ 3:07 Spoke with patient about labs  Amylase trending down, liver enzymes trending down but still elevated Called in add on tests Will go ahead and get acute hepatitis panel Will get fractionated amylase to see if other entities besides pancreas causing elevated amylase ie parotids, liver vs pancreas since lipase is normal Will get CT abd and pelvis w/wout contrast to rule put pancreatitis/mass/etc Will refer to GI, she does not want to see Dr Benson Norway again.  Discussed this plan with patient and she agrees to plan   Results for orders placed in visit on 03/07/14  AMYLASE      Result Value Ref Range   Amylase 852 (*) 0 - 105 U/L  LIPASE      Result Value Ref Range   Lipase 11  0 - 75 U/L  COMPREHENSIVE METABOLIC PANEL      Result Value Ref Range   Sodium 138  135 - 145 mEq/L   Potassium 4.0  3.5 - 5.3 mEq/L   Chloride 99  96 - 112 mEq/L   CO2 26  19 - 32 mEq/L   Glucose, Bld 87  70 - 99 mg/dL   BUN 12  6 - 23 mg/dL   Creat 0.66  0.50 - 1.10 mg/dL   Total Bilirubin 0.8  0.2 - 1.2 mg/dL   Alkaline Phosphatase 126 (*) 39 - 117 U/L   AST 91 (*) 0 - 37 U/L   ALT 67 (*) 0 - 35 U/L   Total Protein 6.8  6.0 - 8.3 g/dL   Albumin 4.2  3.5 - 5.2 g/dL   Calcium 9.3  8.4 - 10.5 mg/dL  POCT CBC      Result Value Ref Range   WBC 7.6  4.6 - 10.2 K/uL   Lymph, poc 1.5  0.6 - 3.4   POC LYMPH PERCENT 19.7  10 - 50 %L   MID (cbc) 0.8  0 - 0.9   POC MID %  9.9  0 - 12 %M   POC Granulocyte 5.4  2 - 6.9   Granulocyte percent 70.4  37 - 80 %G   RBC 2.98 (*) 4.04 - 5.48 M/uL   Hemoglobin 9.8 (*) 12.2 - 16.2 g/dL   HCT, POC 31.2 (*) 37.7 - 47.9 %   MCV 104.8 (*) 80 - 97 fL   MCH, POC 32.9 (*) 27 - 31.2 pg   MCHC 31.4 (*) 31.8 - 35.4 g/dL   RDW, POC 14.8     Platelet Count, POC 259  142 - 424 K/uL   MPV 8.7  0 - 99.8 fL

## 2014-03-09 LAB — AMYLASE ISOENZYMES
Amylase.: 862 U/L — ABNORMAL HIGH (ref 21–101)
Isoamylase-Pancreatic: 61 U/L — ABNORMAL HIGH (ref 16–46)
Salivary Fraction: 801 U/L — ABNORMAL HIGH (ref 4–61)

## 2014-03-11 LAB — MUMPS ANTIBODY, IGM: Mumps IgM Value: <1:20 {titer}

## 2014-03-12 ENCOUNTER — Encounter: Payer: Self-pay | Admitting: Family Medicine

## 2014-03-18 ENCOUNTER — Telehealth: Payer: Self-pay

## 2014-03-18 DIAGNOSIS — K859 Acute pancreatitis without necrosis or infection, unspecified: Secondary | ICD-10-CM

## 2014-03-18 NOTE — Telephone Encounter (Signed)
[facility] called this morning to let us know that patients bcbs denied pelvis ct but only authorized for abdominal ct. Please advise. Their office says they need orders changed to reflect that in order to scheduled appointment and to go through with insurance. Thank you!

## 2014-03-19 NOTE — Telephone Encounter (Signed)
Placed orders for CT abd with and without contrast per Dr. Gus Puma OV note

## 2014-03-22 ENCOUNTER — Ambulatory Visit
Admission: RE | Admit: 2014-03-22 | Discharge: 2014-03-22 | Disposition: A | Discharge status: Home / Self Care | Payer: BC Managed Care – PPO | Source: Ambulatory Visit | Attending: Physician Assistant | Admitting: Physician Assistant

## 2014-03-22 ENCOUNTER — Inpatient Hospital Stay: Admission: RE | Admit: 2014-03-22 | Payer: BC Managed Care – PPO | Source: Ambulatory Visit

## 2014-03-22 DIAGNOSIS — K859 Acute pancreatitis without necrosis or infection, unspecified: Secondary | ICD-10-CM

## 2014-03-22 IMAGING — CT CT ABDOMEN W/ CM
3 of 5 series · 13 of 36 positions shown, 19 images · IV contrast (READICAT/WATER & [ID] OMNI 300)
Comparison: [DATE]

CLINICAL DATA: Pancreatitis.  Nausea.

EXAM:
CT ABDOMEN WITH CONTRAST
TECHNIQUE: Multidetector CT imaging of the abdomen was performed using the
standard protocol following bolus administration of intravenous
contrast.
CONTRAST:  100mL OMNIPAQUE IOHEXOL 300 MG/ML  SOLN

[Series 3: abdomen with · axial · 0.70mm/px · z∈[-223,-48]mm · 5 of 53 slices shown, 10 images]
[im 9/53  soft-tissue]
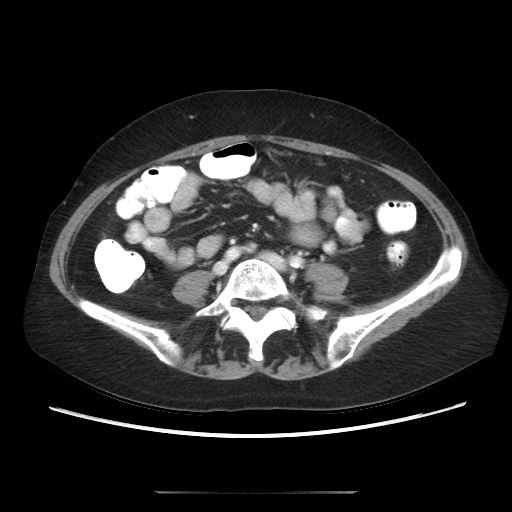
[im 9/53  bone]
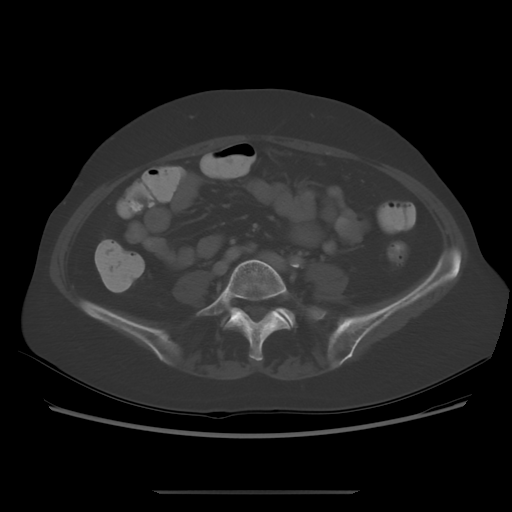
[im 18/53  soft-tissue]
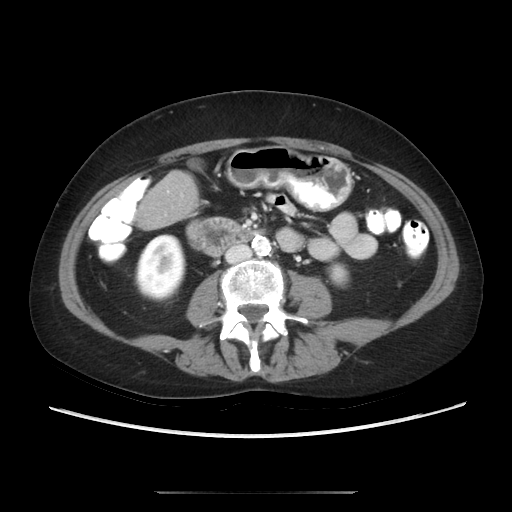
[im 18/53  lung]
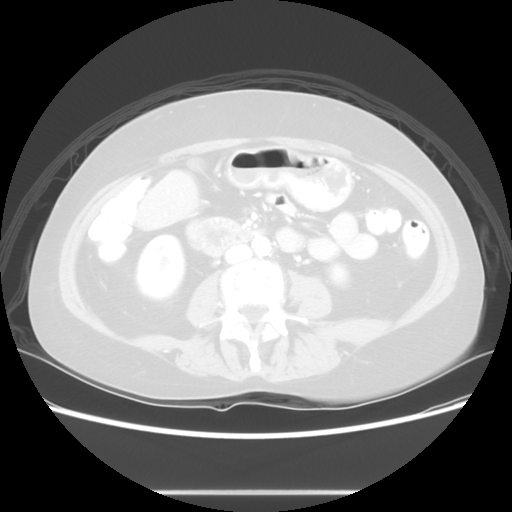
[im 27/53  soft-tissue]
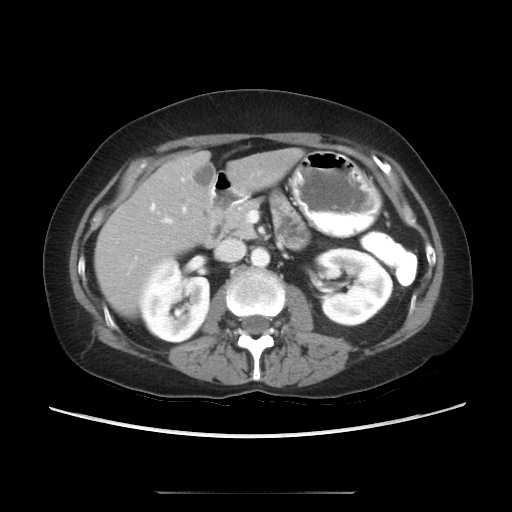
[im 27/53  lung]
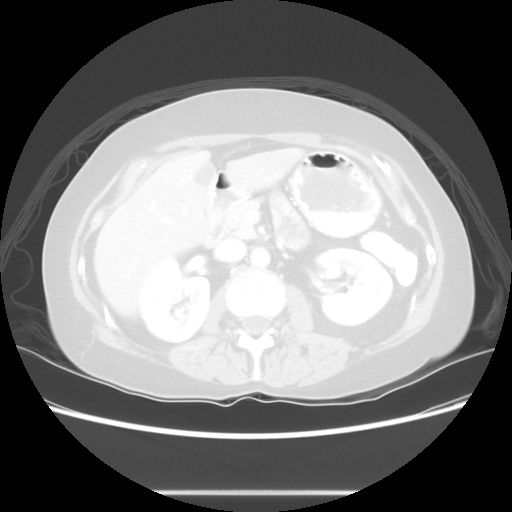
[im 35/53  soft-tissue]
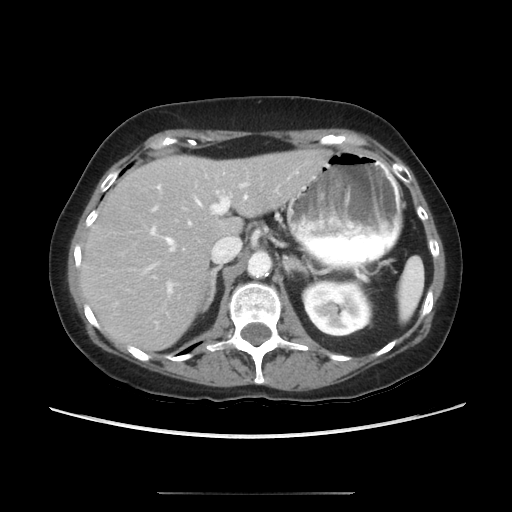
[im 35/53  lung]
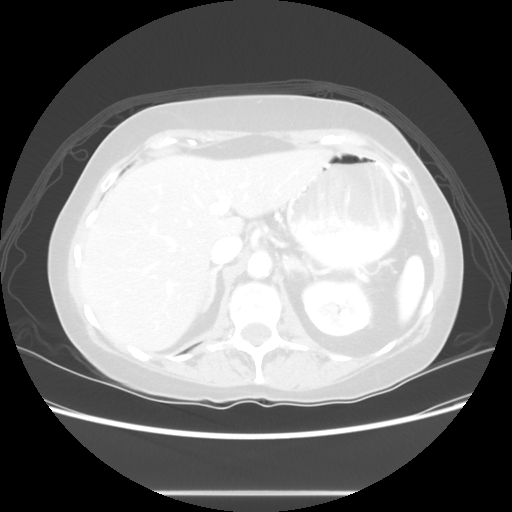
[im 44/53  soft-tissue]
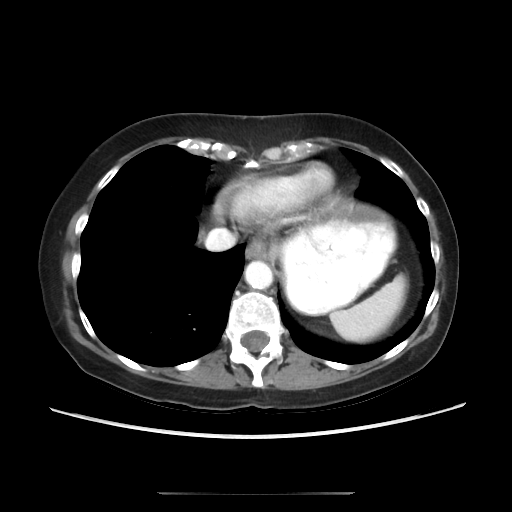
[im 44/53  lung]
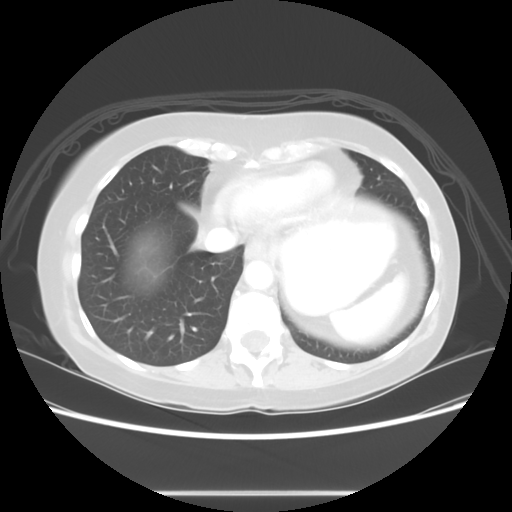

[Series 601: coronal body · coronal · 0.70mm/px · 1 of 95 slices shown, 2 images]
[im 32/95  soft-tissue]
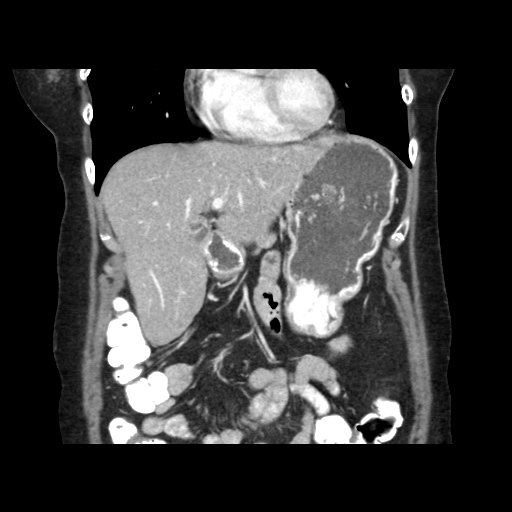
[im 32/95  bone]
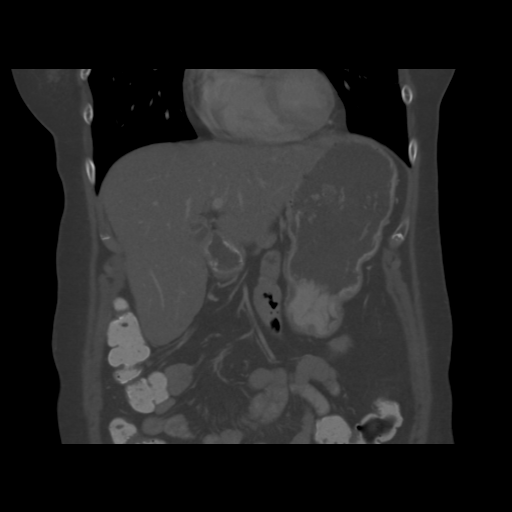

[Series 602: sagittal body · sagittal · 0.70mm/px · 7 of 142 slices shown]
[im 16/142  soft-tissue]
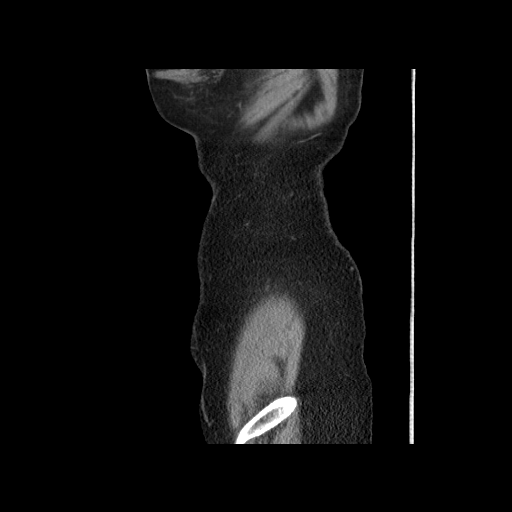
[im 32/142  soft-tissue]
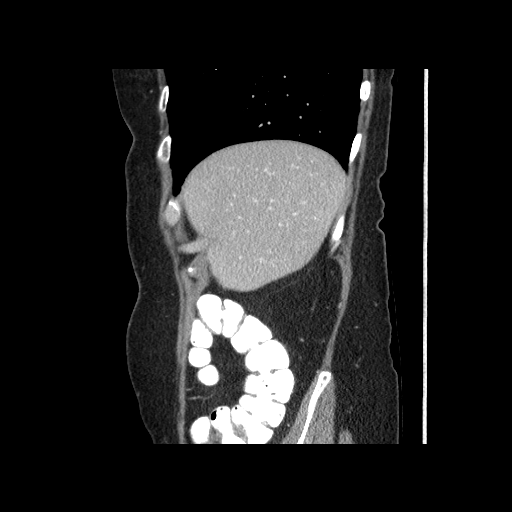
[im 48/142  soft-tissue]
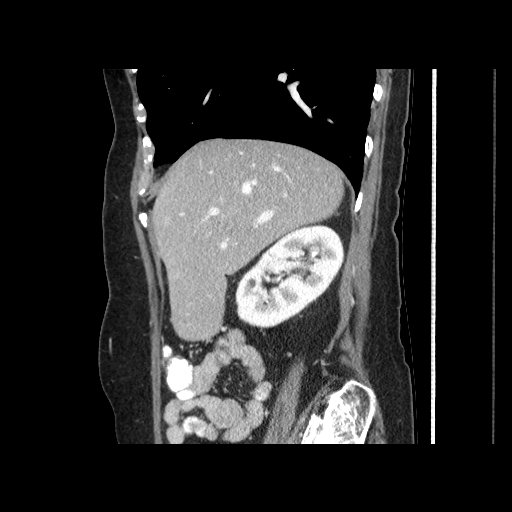
[im 63/142  soft-tissue]
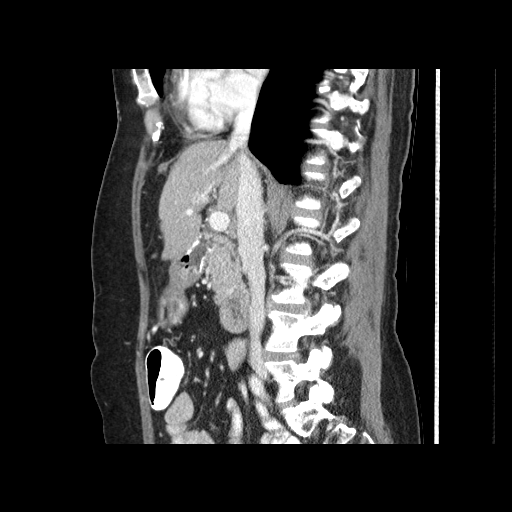
[im 79/142  soft-tissue]
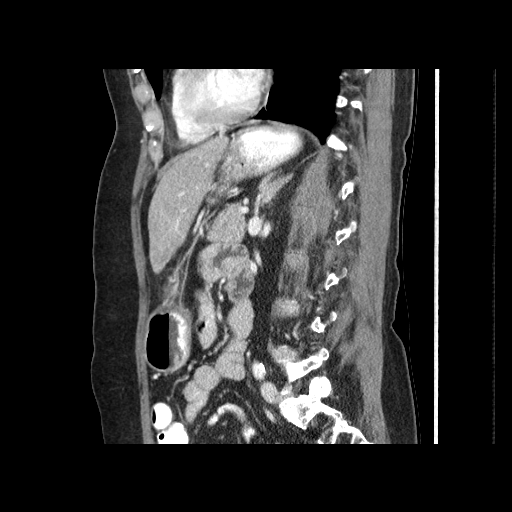
[im 95/142  soft-tissue]
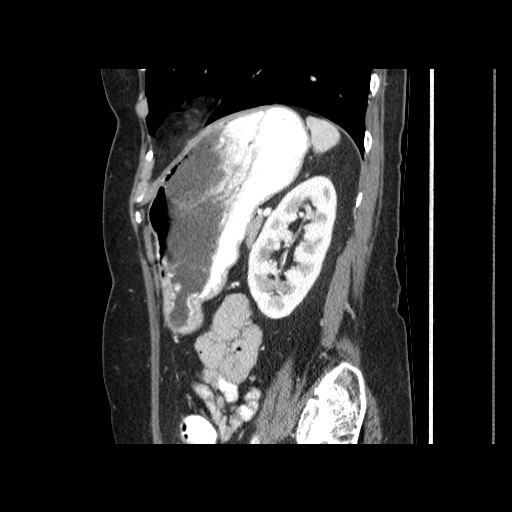
[im 110/142  soft-tissue]
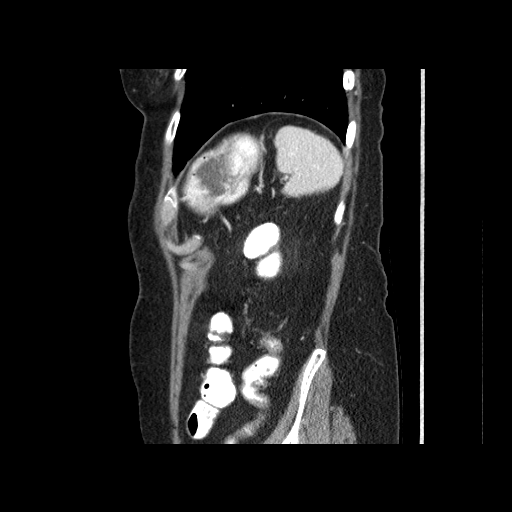

[13 of 36 positions shown; findings below may reference images not displayed]

FINDINGS: The esophageal wall thickening shown on the prior exam is no longer
readily apparent. Borderline elevation of the left hemidiaphragm
noted.

The liver, spleen, pancreas, and adrenal glands appear unremarkable.
No significant abnormal peripancreatic inflammatory stranding is
currently observed. Aortoiliac atherosclerotic vascular disease. No
pathologic upper abdominal adenopathy is observed.

Several colonic diverticular present and did not appear inflamed.
There is some early contrast excretion in the renal collecting
systems on the portal venous phase images.

Gallbladder appears within normal limits on multiplanar imaging. No
overt biliary dilatation. Focal density along the descending
duodenal lumen on image 45 of series 601 is most attributable to
contrast medium.

Degenerative disc disease noted at L3-4 and L4-5.
IMPRESSION: 1. No specific imaging indicators of acute pancreatitis.
2. Atherosclerosis.
3. Lumbar degenerative disc disease.
4. Borderline elevation of the left hemidiaphragm.

## 2014-03-22 MED ORDER — IOHEXOL 300 MG/ML  SOLN
100.0000 mL | Freq: Once | INTRAMUSCULAR | Status: AC | PRN
  Administered 2014-03-22: 100 mL via INTRAVENOUS

## 2014-03-22 NOTE — Telephone Encounter (Signed)
Spoke with Holly Phelps at [place] relayed the message that Dr. Marin Comment placed orders for CT abd with and without contrast. She said insurance is only approving with contrast.  Told her I would let the doctor know.

## 2014-03-23 ENCOUNTER — Telehealth: Payer: Self-pay

## 2014-03-23 NOTE — Telephone Encounter (Signed)
Error phone message.

## 2014-03-23 NOTE — Telephone Encounter (Signed)
Patient called stated she is following up with Dr. Marin Comment about a letter she received from her insurance company stated they will not cover for the CT. She want to know if Dr. Marin Comment is following up on this matter. [phone]

## 2014-03-23 NOTE — Telephone Encounter (Signed)
Patient called to inquire about her CT that was denied by insurance.  Wanting to know if Dr. Marin Comment is working on this. I told her that the CT abdomen was approved and the pelvis was denied.  She stated she had the abdomen done yesterday, but got a letter today stating that the pelvis was denied.  I explained that she did not have a pelvic CT so she could disregard the letter.  Patient verbalized understanding.

## 2014-04-07 ENCOUNTER — Encounter: Payer: Self-pay | Admitting: Internal Medicine

## 2014-09-01 IMAGING — US US ABDOMEN COMPLETE
1 series · 14 of 25 positions shown · non-contrast
Comparison: CT abdomen and pelvis [DATE]

CLINICAL DATA: Emesis, elevated LFTs, smoker, anemia, hypertension,
GERD

EXAM:
ULTRASOUND ABDOMEN COMPLETE

[Series 1: us abdomen complete · 0.14mm/px · 14 of 88 slices shown]
[im 1/88]
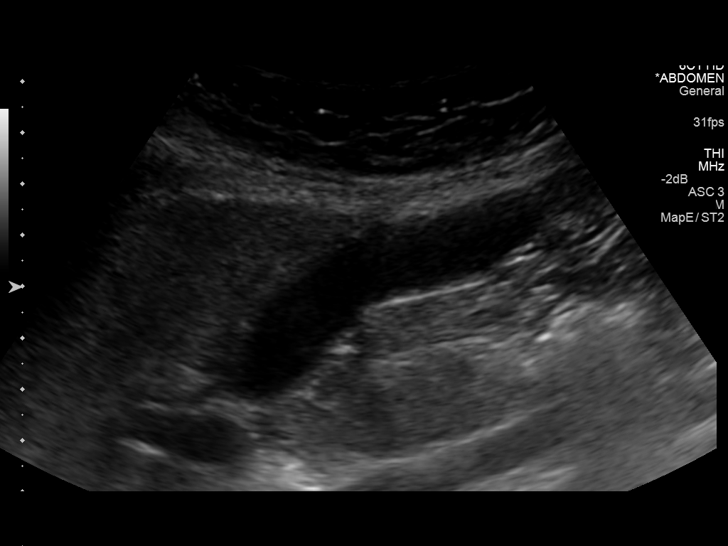
[im 8/88]
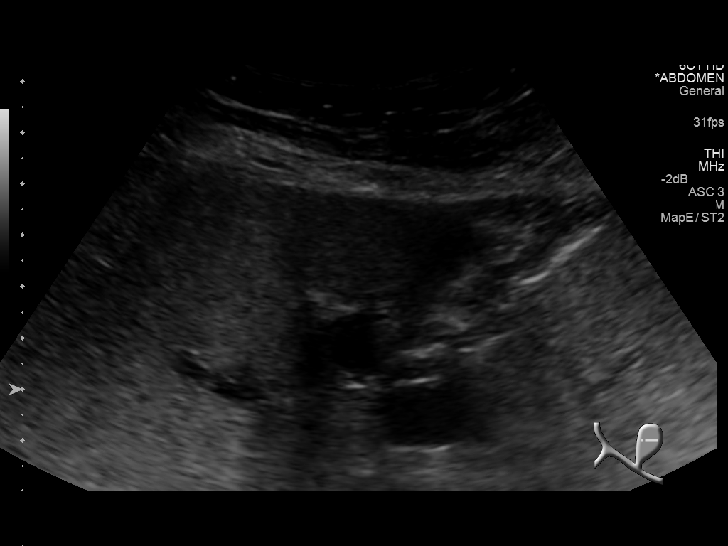
[im 15/88]
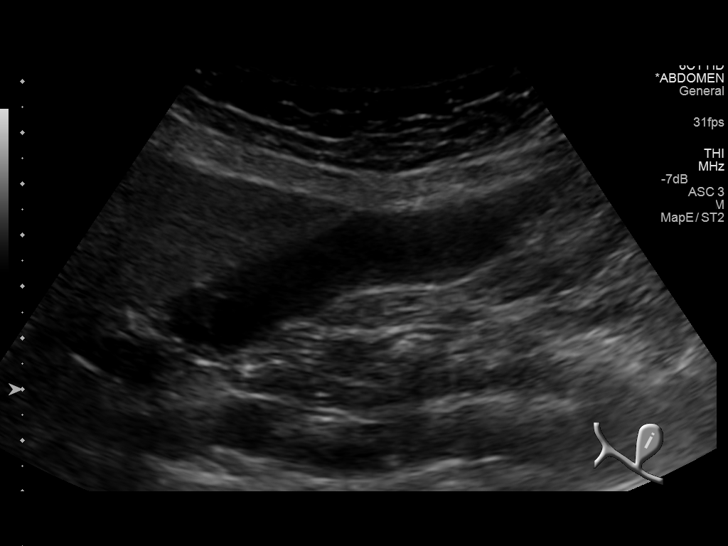
[im 22/88]
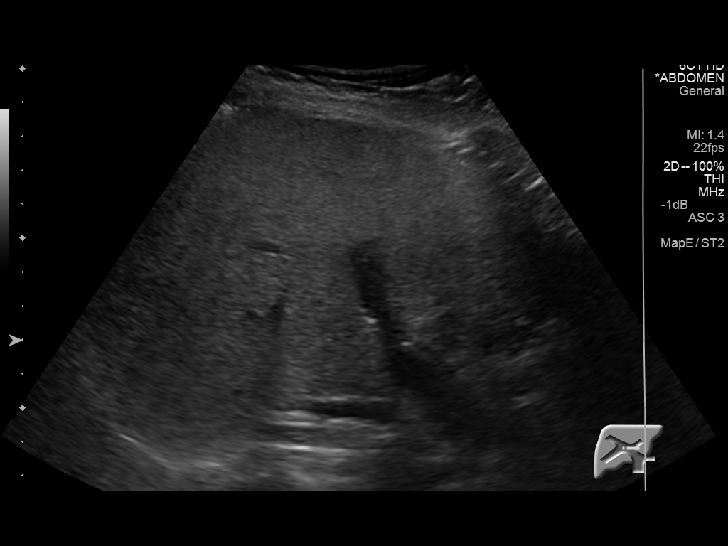
[im 30/88]
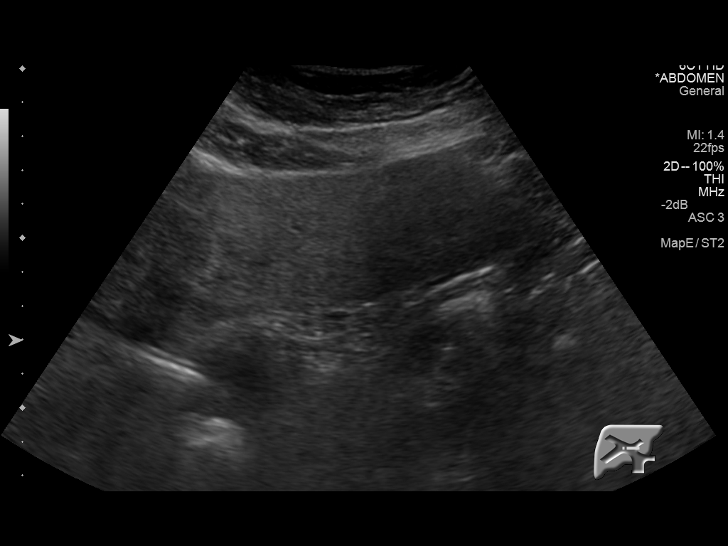
[im 33/88]
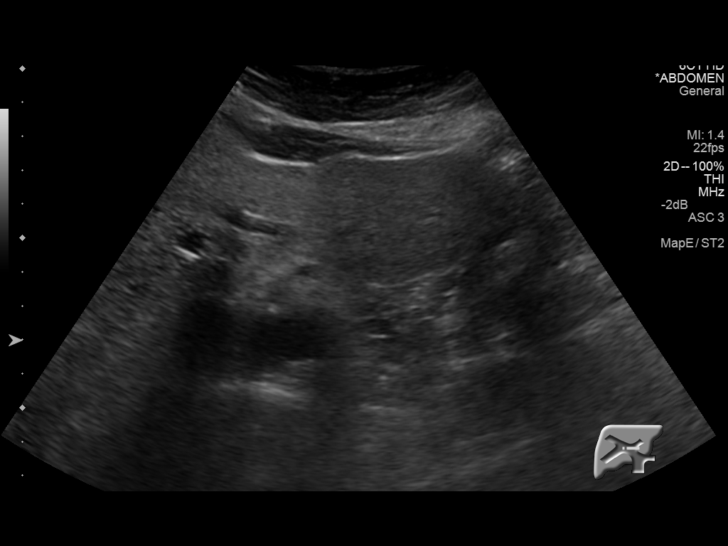
[im 40/88]
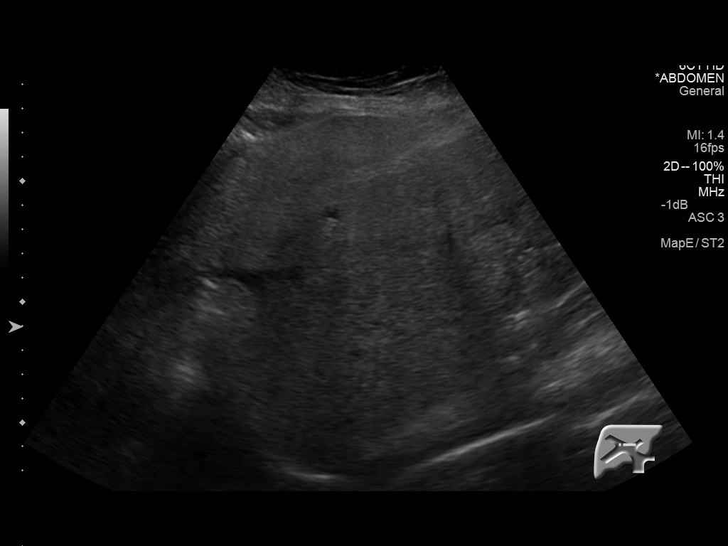
[im 48/88]
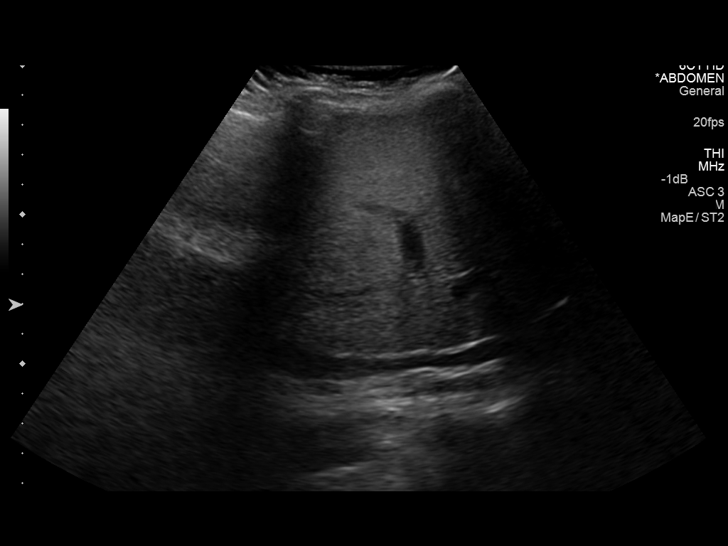
[im 55/88]
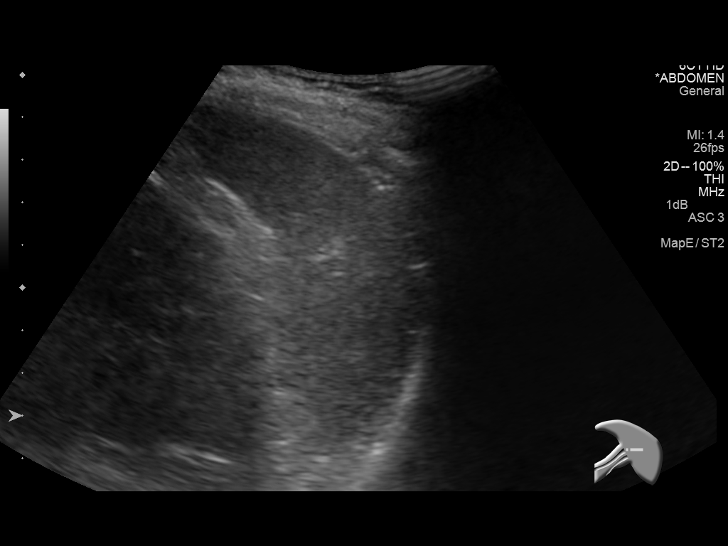
[im 59/88]
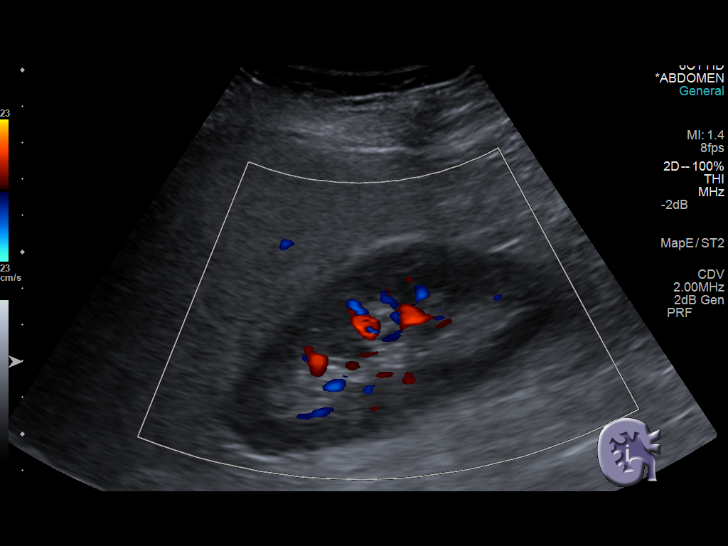
[im 66/88]
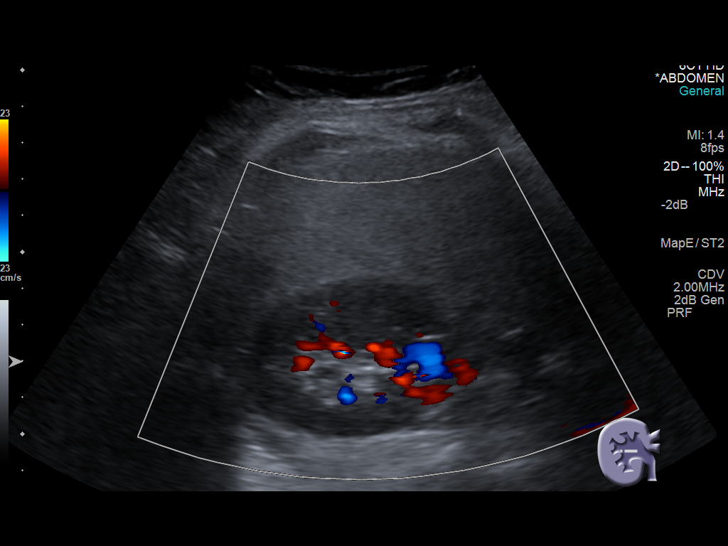
[im 73/88]
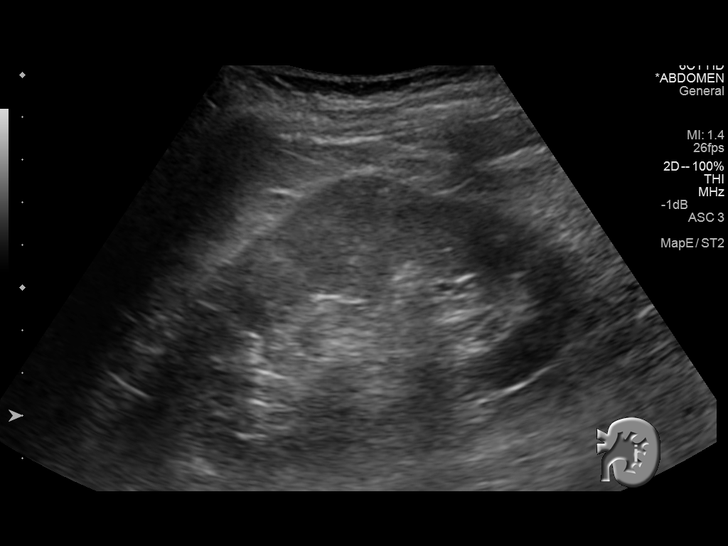
[im 80/88]
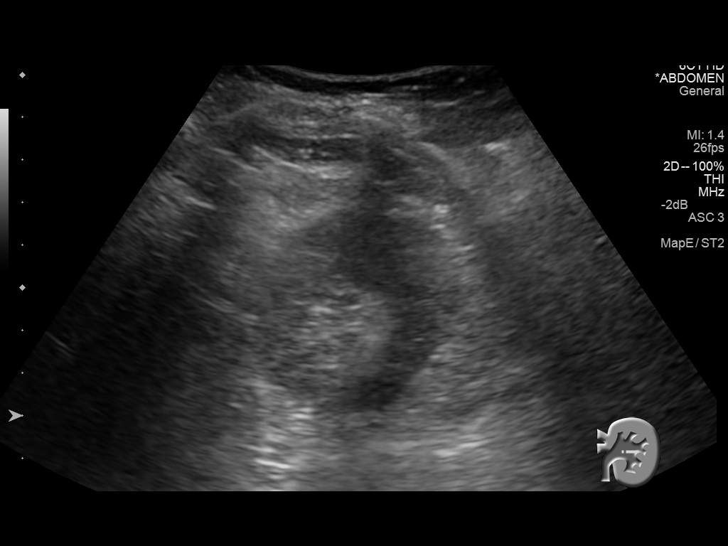
[im 88/88]
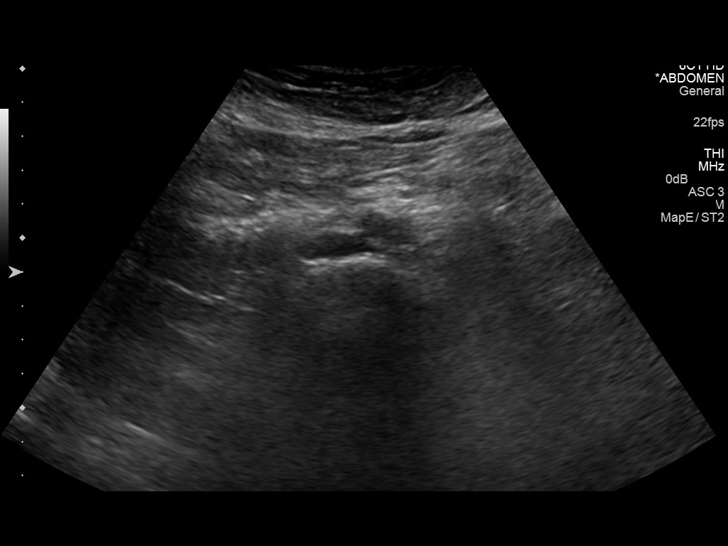

[14 of 25 positions shown; findings below may reference images not displayed]

FINDINGS: Gallbladder: Normally distended without stones or wall thickening.

No pericholecystic fluid or sonographic Murphy sign.

Common bile duct: Diameter: Normal caliber 2 mm diameter

Liver: Echogenic, likely fatty infiltration, though this can be seen
with cirrhosis and certain infiltrative disorders. No focal hepatic
mass or nodularity. Hepatopetal portal venous flow.

IVC: Normal appearance

Pancreas: Inadequately visualized due to obscuration by bowel gas.

Spleen: Normal appearance, 6.0 cm length

Right Kidney: Length: 10.3 cm. Normal morphology without mass or
hydronephrosis.

Left Kidney: Length: 9.9 cm. Normal morphology without mass or
hydronephrosis.

Abdominal aorta: Normal caliber

Other findings: No free-fluid
IMPRESSION: Probable fatty infiltration of liver as discussed above.

Incomplete pancreatic visualization.

Otherwise normal exam.

## 2014-09-27 ENCOUNTER — Ambulatory Visit (INDEPENDENT_AMBULATORY_CARE_PROVIDER_SITE_OTHER): Payer: BC Managed Care – PPO | Admitting: Physician Assistant

## 2014-09-27 VITALS — BP 156/90 | HR 115 | Temp 98.5°F | Resp 18 | Ht 62.0 in | Wt 113.8 lb

## 2014-09-27 DIAGNOSIS — R319 Hematuria, unspecified: Secondary | ICD-10-CM

## 2014-09-27 DIAGNOSIS — R1030 Lower abdominal pain, unspecified: Secondary | ICD-10-CM

## 2014-09-27 LAB — POCT UA - MICROSCOPIC ONLY
Casts, Ur, LPF, POC: NEGATIVE
Crystals, Ur, HPF, POC: NEGATIVE
Mucus, UA: NEGATIVE
Yeast, UA: NEGATIVE

## 2014-09-27 LAB — POCT URINALYSIS DIPSTICK
Bilirubin, UA: NEGATIVE
Glucose, UA: NEGATIVE
Ketones, UA: 40
Nitrite, UA: POSITIVE
Spec Grav, UA: 1.015
Urobilinogen, UA: 0.2
pH, UA: 6

## 2014-09-27 MED ORDER — NITROFURANTOIN MONOHYD MACRO 100 MG PO CAPS: 100.0000 mg | ORAL_CAPSULE | Freq: Two times a day (BID) | ORAL | Status: DC

## 2014-09-27 NOTE — Progress Notes (Signed)
I have discussed this case with Mr. Rosanne Sack, [place] and agree.

## 2014-09-27 NOTE — Patient Instructions (Signed)
Your symptoms are due to a UTI. Please take the macrobid twice daily for 5 days.  If your lower abdominal pain does not resolve over the next couple days please return to clinic.  You had a lot of blood in your urine that showed up under the microscope. This is most likely due to the UTI, but could be from other causes. Please come back to clinic in 2 weeks to give another urine sample to be sure the blood has resolved. If it has not we may refer you to a specialist.

## 2014-09-27 NOTE — Progress Notes (Signed)
Subjective:    Patient ID: Holly Phelps, female    DOB: 31-Dec-1960, 53 y.o.   MRN: [id number]  Holly Portela, MD  Chief Complaint  Patient presents with  . Abdominal Pain    Pain in pelvic/bladder area, X 3:00 this morning  . Urinary Retention    Trouble fully emptying bladder since early this morning   Patient Active Problem List   Diagnosis Date Noted  . Pancreatitis 01/29/2013  . Anemia 01/29/2013  . Nausea vomiting and diarrhea 01/26/2013  . Dehydration 01/26/2013  . Esophagitis 01/26/2013  . HYPOTHYROIDISM 03/01/2008  . ANXIETY 03/01/2008  . ALCOHOL ABUSE 03/01/2008  . HYPERTENSION 03/01/2008  . LIVER FUNCTION TESTS, ABNORMAL, HX OF 03/01/2008   Prior to Admission medications   Medication Sig Start Date End Date Taking? Authorizing Provider  Cranberry-Vitamin C-Probiotic (AZO CRANBERRY PO) Take by mouth.   Yes Historical Provider, MD  esomeprazole (NEXIUM) 40 MG capsule Take 1 capsule (40 mg total) by mouth daily. 03/06/14  Yes Gay Filler Copland, MD  Multiple Vitamin (MULTIVITAMIN WITH MINERALS) TABS Take 1 tablet by mouth daily. 01/29/13  Yes [name], MD   Medications, allergies, past medical history, surgical history, family history, social history and problem list reviewed and updated.  HPI  87 yof with PMH alcohol dependence/pancreatitis in 4/15 and esophagitis presents with lower abd pain.   She awoke at 3am this morning with suprapubic pain. Felt like she needed to pee but couldn't. Pain was intermittent and shooting for about one hour, eventually she feel back asleep. Woke back up at 7am to urinate and was able urinate, no hematuria, was not painful. Since this time the suprapubic pain has been intermittent and shooting, rated 5/10 at worst. Does not radiate. Only lasts seconds per episode. Had not had this pain at all prior to this am. No RLQ or LLQ pain. Denies any urinary freq, urgency. No dysuria. No recent diarrhea, N/V.Had normal BM yest. Ate  normal amnt yest.  No fever, chills. Has not had sex in at least 6 months, denies vaginal dc or odor.   She is currently pain free. HR and BP bit elevated in clinic today. She states she doesn't want to be here and she didn't sleep well last night. Denies HA, CP, SOB, dizziness, syncope.   Review of Systems No CP, SOB, fever, chills, constipation. See HPI.     Objective:   Physical Exam  Constitutional: She is oriented to person, place, and time. She appears well-developed and well-nourished.  Non-toxic appearance. She does not have a sickly appearance. She does not appear ill. No distress.  BP 156/90 mmHg  Pulse 115  Temp(Src) 98.5 F (36.9 C) (Oral)  Resp 18  Ht 5\' 2"  (1.575 m)  Wt 113 lb 12.8 oz (51.619 kg)  BMI 20.81 kg/m2  SpO2 100%   HENT:  Head: Normocephalic and atraumatic.  Cardiovascular: Normal rate, regular rhythm, S1 normal, S2 normal and normal heart sounds.  Exam reveals no gallop.   No murmur heard. Pulmonary/Chest: Effort normal and breath sounds normal. She has no decreased breath sounds. She has no wheezes. She has no rhonchi. She has no rales.  Abdominal: Soft. Normal appearance and bowel sounds are normal. She exhibits no distension, no pulsatile liver and no mass. There is no tenderness. There is no rigidity, no rebound, no guarding and no CVA tenderness.  No TTP suprapubic, RLQ, LLQ.   Neurological: She is alert and oriented to person, place, and time.  Skin:  No abd rash.   Psychiatric: She has a normal mood and affect. Her speech is normal.   Results for orders placed or performed in visit on 09/27/14  POCT urinalysis dipstick  Result Value Ref Range   Color, UA dark yellow    Clarity, UA cloudy    Glucose, UA neg    Bilirubin, UA neg    Ketones, UA 40    Spec Grav, UA 1.015    Blood, UA large    pH, UA 6.0    Protein, UA trace    Urobilinogen, UA 0.2    Nitrite, UA positive    Leukocytes, UA small (1+)   POCT UA - Microscopic Only  Result  Value Ref Range   WBC, Ur, HPF, POC 20-25    RBC, urine, microscopic tntc    Bacteria, U Microscopic 4+    Mucus, UA neg    Epithelial cells, urine per micros 0-4    Crystals, Ur, HPF, POC neg    Casts, Ur, LPF, POC neg    Yeast, UA neg         Assessment & Plan:   60 yof with PMH alcohol dependence/pancreatitis in 4/15 and esophagitis presents with lower abd pain.  Lower abdominal pain - Plan: POCT urinalysis dipstick, POCT UA - Microscopic Only, nitrofurantoin, macrocrystal-monohydrate, (MACROBID) 100 MG capsule --UA with leukocytes, blood, and nitrite positive. Most likely UTI --Macrobid bid 5 days --RTC id sx not resolving 2-3 days  Hematuria --RBC too numerous to count under microscope --Likely secondary to uti --RTC 2 wks for ua to ensure resolved --If not, refer  Holly Gutting, PA-C Physician Assistant-Certified [facility]  09/27/2014 10:47 AM

## 2014-11-30 ENCOUNTER — Telehealth: Payer: Self-pay

## 2014-11-30 DIAGNOSIS — K219 Gastro-esophageal reflux disease without esophagitis: Secondary | ICD-10-CM

## 2014-11-30 NOTE — Telephone Encounter (Signed)
Pharm faxed req for a new Rx for alternative to Nexium which has gone up to $225 for 30 mos supply. Dr Lorelei Pont, do you want to write for pantoprazole or omeprazole which are usually covered, or pt need to RTC first?

## 2014-12-01 MED ORDER — OMEPRAZOLE 20 MG PO CPDR: 20.0000 mg | DELAYED_RELEASE_CAPSULE | Freq: Every day | ORAL | Status: DC

## 2014-12-01 NOTE — Telephone Encounter (Signed)
lmom for her that I was changing her to prilosec for her GERD due to insurance coverage changes.  Let me know if any questions.  If she no longer needs this medication disregard

## 2015-03-13 ENCOUNTER — Ambulatory Visit (INDEPENDENT_AMBULATORY_CARE_PROVIDER_SITE_OTHER): Payer: BLUE CROSS/BLUE SHIELD | Admitting: Urgent Care

## 2015-03-13 VITALS — BP 148/90 | HR 138 | Temp 97.8°F | Resp 18 | Ht 62.0 in | Wt 104.0 lb

## 2015-03-13 DIAGNOSIS — E039 Hypothyroidism, unspecified: Secondary | ICD-10-CM | POA: Diagnosis not present

## 2015-03-13 DIAGNOSIS — I10 Essential (primary) hypertension: Secondary | ICD-10-CM | POA: Diagnosis not present

## 2015-03-13 DIAGNOSIS — K5900 Constipation, unspecified: Secondary | ICD-10-CM | POA: Diagnosis not present

## 2015-03-13 DIAGNOSIS — R899 Unspecified abnormal finding in specimens from other organs, systems and tissues: Secondary | ICD-10-CM | POA: Diagnosis not present

## 2015-03-13 DIAGNOSIS — K219 Gastro-esophageal reflux disease without esophagitis: Secondary | ICD-10-CM

## 2015-03-13 DIAGNOSIS — M79671 Pain in right foot: Secondary | ICD-10-CM

## 2015-03-13 DIAGNOSIS — M79605 Pain in left leg: Secondary | ICD-10-CM

## 2015-03-13 DIAGNOSIS — D649 Anemia, unspecified: Secondary | ICD-10-CM | POA: Diagnosis not present

## 2015-03-13 DIAGNOSIS — R5383 Other fatigue: Secondary | ICD-10-CM

## 2015-03-13 DIAGNOSIS — M79604 Pain in right leg: Secondary | ICD-10-CM | POA: Diagnosis not present

## 2015-03-13 DIAGNOSIS — M79672 Pain in left foot: Secondary | ICD-10-CM

## 2015-03-13 LAB — THYROID PANEL WITH TSH
Free Thyroxine Index: 1.8 (ref 1.4–3.8)
T3 Uptake: 28 % (ref 22–35)
T4, Total: 6.6 ug/dL (ref 4.5–12.0)
TSH: 5.782 u[IU]/mL — ABNORMAL HIGH (ref 0.350–4.500)

## 2015-03-13 LAB — POCT CBC
Granulocyte percent: 68.4 %G (ref 37–80)
HCT, POC: 26.5 % — AB (ref 37.7–47.9)
Hemoglobin: 8.1 g/dL — AB (ref 12.2–16.2)
Lymph, poc: 1.4 (ref 0.6–3.4)
MCH, POC: 25.3 pg — AB (ref 27–31.2)
MCHC: 30.5 g/dL — AB (ref 31.8–35.4)
MCV: 83 fL (ref 80–97)
MID (cbc): 0.7 (ref 0–0.9)
MPV: 6.8 fL (ref 0–99.8)
POC Granulocyte: 4.4 (ref 2–6.9)
POC LYMPH PERCENT: 21.3 %L (ref 10–50)
POC MID %: 10.3 %M (ref 0–12)
Platelet Count, POC: 354 10*3/uL (ref 142–424)
RBC: 3.2 M/uL — AB (ref 4.04–5.48)
RDW, POC: 21.5 %
WBC: 6.4 10*3/uL (ref 4.6–10.2)

## 2015-03-13 LAB — COMPREHENSIVE METABOLIC PANEL
ALT: 36 U/L — ABNORMAL HIGH (ref 0–35)
AST: 199 U/L — ABNORMAL HIGH (ref 0–37)
Albumin: 4 g/dL (ref 3.5–5.2)
Alkaline Phosphatase: 247 U/L — ABNORMAL HIGH (ref 39–117)
BUN: 5 mg/dL — ABNORMAL LOW (ref 6–23)
CO2: 27 mEq/L (ref 19–32)
Calcium: 9 mg/dL (ref 8.4–10.5)
Chloride: 96 mEq/L (ref 96–112)
Creat: 0.53 mg/dL (ref 0.50–1.10)
Glucose, Bld: 118 mg/dL — ABNORMAL HIGH (ref 70–99)
Potassium: 3.2 mEq/L — ABNORMAL LOW (ref 3.5–5.3)
Sodium: 139 mEq/L (ref 135–145)
Total Bilirubin: 0.7 mg/dL (ref 0.2–1.2)
Total Protein: 7.3 g/dL (ref 6.0–8.3)

## 2015-03-13 LAB — VITAMIN B12: Vitamin B-12: 724 pg/mL (ref 211–911)

## 2015-03-13 LAB — FOLATE: Folate: 13.2 ng/mL

## 2015-03-13 LAB — IFOBT (OCCULT BLOOD): IFOBT: POSITIVE

## 2015-03-13 NOTE — Patient Instructions (Signed)
Anemia, Nonspecific Anemia is a condition in which the concentration of red blood cells or hemoglobin in the blood is below normal. Hemoglobin is a substance in red blood cells that carries oxygen to the tissues of the body. Anemia results in not enough oxygen reaching these tissues.  CAUSES  Common causes of anemia include:   Excessive bleeding. Bleeding may be internal or external. This includes excessive bleeding from periods (in women) or from the intestine.   Poor nutrition.   Chronic kidney, thyroid, and liver disease.  Bone marrow disorders that decrease red blood cell production.  Cancer and treatments for cancer.  HIV, AIDS, and their treatments.  Spleen problems that increase red blood cell destruction.  Blood disorders.  Excess destruction of red blood cells due to infection, medicines, and autoimmune disorders. SIGNS AND SYMPTOMS   Minor weakness.   Dizziness.   Headache.  Palpitations.   Shortness of breath, especially with exercise.   Paleness.  Cold sensitivity.  Indigestion.  Nausea.  Difficulty sleeping.  Difficulty concentrating. Symptoms may occur suddenly or they may develop slowly.  DIAGNOSIS  Additional blood tests are often needed. These help your health care provider determine the best treatment. Your health care provider will check your stool for blood and look for other causes of blood loss.  TREATMENT  Treatment varies depending on the cause of the anemia. Treatment can include:   Supplements of iron, vitamin C16, or folic acid.   Hormone medicines.   A blood transfusion. This may be needed if blood loss is severe.   Hospitalization. This may be needed if there is significant continual blood loss.   Dietary changes.  Spleen removal. HOME CARE INSTRUCTIONS Keep all follow-up appointments. It often takes many weeks to correct anemia, and having your health care provider check on your condition and your response to  treatment is very important. SEEK IMMEDIATE MEDICAL CARE IF:   You develop extreme weakness, shortness of breath, or chest pain.   You become dizzy or have trouble concentrating.  You develop heavy vaginal bleeding.   You develop a rash.   You have bloody or black, tarry stools.   You faint.   You vomit up blood.   You vomit repeatedly.   You have abdominal pain.  You have a fever or persistent symptoms for more than 2-3 days.   You have a fever and your symptoms suddenly get worse.   You are dehydrated.  MAKE SURE YOU:  Understand these instructions.  Will watch your condition.  Will get help right away if you are not doing well or get worse. Document Released: 12/13/2004 Document Revised: 07/08/2013 Document Reviewed: 05/01/2013 [facility] Patient Information 2015 [place], [place]. This information is not intended to replace advice given to you by your health care provider. Make sure you discuss any questions you have with your health care provider.   Hypertension Hypertension, commonly called high blood pressure, is when the force of blood pumping through your arteries is too strong. Your arteries are the blood vessels that carry blood from your heart throughout your body. A blood pressure reading consists of a higher number over a lower number, such as 110/72. The higher number (systolic) is the pressure inside your arteries when your heart pumps. The lower number (diastolic) is the pressure inside your arteries when your heart relaxes. Ideally you want your blood pressure below 120/80. Hypertension forces your heart to work harder to pump blood. Your arteries may become narrow or stiff. Having hypertension puts  you at risk for heart disease, stroke, and other problems.  RISK FACTORS Some risk factors for high blood pressure are controllable. Others are not.  Risk factors you cannot control include:   Race. You may be at higher risk if you are African  American.  Age. Risk increases with age.  Gender. Men are at higher risk than women before age 8 years. After age 11, women are at higher risk than men. Risk factors you can control include:  Not getting enough exercise or physical activity.  Being overweight.  Getting too much fat, sugar, calories, or salt in your diet.  Drinking too much alcohol. SIGNS AND SYMPTOMS Hypertension does not usually cause signs or symptoms. Extremely high blood pressure (hypertensive crisis) may cause headache, anxiety, shortness of breath, and nosebleed. DIAGNOSIS  To check if you have hypertension, your health care provider will measure your blood pressure while you are seated, with your arm held at the level of your heart. It should be measured at least twice using the same arm. Certain conditions can cause a difference in blood pressure between your right and left arms. A blood pressure reading that is higher than normal on one occasion does not mean that you need treatment. If one blood pressure reading is high, ask your health care provider about having it checked again. TREATMENT  Treating high blood pressure includes making lifestyle changes and possibly taking medicine. Living a healthy lifestyle can help lower high blood pressure. You may need to change some of your habits. Lifestyle changes may include:  Following the DASH diet. This diet is high in fruits, vegetables, and whole grains. It is low in salt, red meat, and added sugars.  Getting at least 2 hours of brisk physical activity every week.  Losing weight if necessary.  Not smoking.  Limiting alcoholic beverages.  Learning ways to reduce stress. If lifestyle changes are not enough to get your blood pressure under control, your health care provider may prescribe medicine. You may need to take more than one. Work closely with your health care provider to understand the risks and benefits. HOME CARE INSTRUCTIONS  Have your blood  pressure rechecked as directed by your health care provider.   Take medicines only as directed by your health care provider. Follow the directions carefully. Blood pressure medicines must be taken as prescribed. The medicine does not work as well when you skip doses. Skipping doses also puts you at risk for problems.   Do not smoke.   Monitor your blood pressure at home as directed by your health care provider. SEEK MEDICAL CARE IF:   You think you are having a reaction to medicines taken.  You have recurrent headaches or feel dizzy.  You have swelling in your ankles.  You have trouble with your vision. SEEK IMMEDIATE MEDICAL CARE IF:  You develop a severe headache or confusion.  You have unusual weakness, numbness, or feel faint.  You have severe chest or abdominal pain.  You vomit repeatedly.  You have trouble breathing. MAKE SURE YOU:   Understand these instructions.  Will watch your condition.  Will get help right away if you are not doing well or get worse. Document Released: 11/05/2005 Document Revised: 03/22/2014 Document Reviewed: 08/28/2013 [facility] Patient Information 2015 [place], [place]. This information is not intended to replace advice given to you by your health care provider. Make sure you discuss any questions you have with your health care provider.   Hypothyroidism The thyroid is a large  gland located in the lower front of your neck. The thyroid gland helps control metabolism. Metabolism is how your body handles food. It controls metabolism with the hormone thyroxine. When this gland is underactive (hypothyroid), it produces too little hormone.  CAUSES These include:  Absence or destruction of thyroid tissue. Goiter due to iodine deficiency. Goiter due to medications. Congenital defects (since birth). Problems with the pituitary. This causes a lack of TSH (thyroid stimulating hormone). This hormone tells the thyroid to turn out more  hormone. SYMPTOMS Lethargy (feeling as though you have no energy) Cold intolerance Weight gain (in spite of normal food intake) Dry skin Coarse hair Menstrual irregularity (if severe, may lead to infertility) Slowing of thought processes Cardiac problems are also caused by insufficient amounts of thyroid hormone. Hypothyroidism in the newborn is cretinism, and is an extreme form. It is important that this form be treated adequately and immediately or it will lead rapidly to retarded physical and mental development. DIAGNOSIS  To prove hypothyroidism, your caregiver may do blood tests and ultrasound tests. Sometimes the signs are hidden. It may be necessary for your caregiver to watch this illness with blood tests either before or after diagnosis and treatment. TREATMENT  Low levels of thyroid hormone are increased by using synthetic thyroid hormone. This is a safe, effective treatment. It usually takes about four weeks to gain the full effects of the medication. After you have the full effect of the medication, it will generally take another four weeks for problems to leave. Your caregiver may start you on low doses. If you have had heart problems the dose may be gradually increased. It is generally not an emergency to get rapidly to normal. HOME CARE INSTRUCTIONS  Take your medications as your caregiver suggests. Let your caregiver know of any medications you are taking or start taking. Your caregiver will help you with dosage schedules. As your condition improves, your dosage needs may increase. It will be necessary to have continuing blood tests as suggested by your caregiver. Report all suspected medication side effects to your caregiver. SEEK MEDICAL CARE IF: Seek medical care if you develop: Sweating. Tremulousness (tremors). Anxiety. Rapid weight loss. Heat intolerance. Emotional swings. Diarrhea. Weakness. SEEK IMMEDIATE MEDICAL CARE IF:  You develop chest pain, an irregular  heart beat (palpitations), or a rapid heart beat. MAKE SURE YOU:  Understand these instructions. Will watch your condition. Will get help right away if you are not doing well or get worse. Document Released: 11/05/2005 Document Revised: 01/28/2012 Document Reviewed: 06/25/2008 [facility] Patient Information 2015 [place], [place]. This information is not intended to replace advice given to you by your health care provider. Make sure you discuss any questions you have with your health care provider.

## 2015-03-13 NOTE — Progress Notes (Signed)
MRN: [id number] DOB: 11-05-1961  Subjective:   Holly Phelps is a 54 y.o. female presenting for chief complaint of Leg Pain  Reports 1 week history of new onset bilateral leg pain, pain is constant, achy in nature, located in the knees radiating down to her toes, pain is worse in the morning and in the evening, pain lasts ~2 hours in the morning before it becomes more dull. Patient is retired, tries to stay active at home. Has tried aspirin with some relief. Denies fevers, chest pain, shortness of breath, nausea, vomiting, abdominal pain, diarrhea, weight loss, night sweats. Smokes ~2 cigarettes per day, ~2 drinks of alcohol per day.   Of note, patient does not have a PCP. She has come into the [facility] as needed. She has previous medical diagnoses of hypertension and hypothyroidism but is not on any medications for this currently. Today, she also complains of down and depressed mood, fatigue, thinning of her hair, dry skin and constipation in the last 3 days. Patient denies double vision, sudden blurred vision, heart racing, palpitations, urinary changes including hematuria, dysuria, oliguria, anuria. She also has a history of severe anemia, not currently being managed by anyone. Patient denies family history of colon cancer, she also denies bloody stools. She reports that she had a colonoscopy in about 6 months ago, had about 3 polyps removed, none were cancerous.  Denies any other aggravating or relieving factors, no other questions or concerns.  Kindsey has a current medication list which includes the following prescription(s): cranberry-vitamin c-probiotic and multivitamin with minerals. She is allergic to codeine; flexeril; sulfa antibiotics; and synthroid.  Shawanda  has a past medical history of Allergy; Anemia; Tuberculosis; Hypertension; Hypothyroidism; and GERD (gastroesophageal reflux disease). Also  has past surgical history that includes Laparoscopic  appendectomy; Back surgery; and Colonoscopy (N/A, 03/13/2013).  ROS As in subjective.  Objective:   Vitals: BP 148/90 mmHg  Pulse 138  Temp(Src) 97.8 F (36.6 C) (Oral)  Resp 18  Ht 5\' 2"  (1.575 m)  Wt 104 lb (47.174 kg)  BMI 19.02 kg/m2  SpO2 97%  Pulse was approximately 100 on recheck by PA Eufemio Strahm.   Physical Exam  Constitutional: She is oriented to person, place, and time and well-developed, well-nourished, and in no distress.  HENT:  Mouth/Throat: Oropharynx is clear and moist. No oropharyngeal exudate.  Eyes: Conjunctivae and EOM are normal. Pupils are equal, round, and reactive to light. Right eye exhibits no discharge. Left eye exhibits no discharge. No scleral icterus.  Neck: Normal range of motion. Neck supple. No thyromegaly present.  Cardiovascular: Regular rhythm and intact distal pulses.  Exam reveals no gallop and no friction rub.   No murmur heard. Pulmonary/Chest: No respiratory distress. She has no wheezes. She has no rales. She exhibits no tenderness.  Abdominal: Soft. Bowel sounds are normal. She exhibits no distension and no mass. There is no tenderness.  Musculoskeletal:  Patient has significant muscular atrophy of limbs. She is able to ambulate on her own without any assistive devices. Dorsalis pedis pulse and posterior tibial pulse are 2+ and symmetric. There is no lower leg swelling, erythema.  Neurological: She is alert and oriented to person, place, and time.  Skin: Skin is warm and dry. No rash noted. No erythema. There is pallor (throughout).   EKG interpretation by Dr. Elder Cyphers and PA-Veron Senner. Tachycardia, sinus rhythm.  Results for orders placed or performed in visit on 03/13/15 (from the past 24 hour(s))  POCT  CBC     Status: Abnormal   Collection Time: 03/13/15  9:22 AM  Result Value Ref Range   WBC 6.4 4.6 - 10.2 K/uL   Lymph, poc 1.4 0.6 - 3.4   POC LYMPH PERCENT 21.3 10 - 50 %L   MID (cbc) 0.7 0 - 0.9   POC MID % 10.3 0 - 12 %M   POC  Granulocyte 4.4 2 - 6.9   Granulocyte percent 68.4 37 - 80 %G   RBC 3.20 (A) 4.04 - 5.48 M/uL   Hemoglobin 8.1 (A) 12.2 - 16.2 g/dL   HCT, POC 26.5 (A) 37.7 - 47.9 %   MCV 83.0 80 - 97 fL   MCH, POC 25.3 (A) 27 - 31.2 pg   MCHC 30.5 (A) 31.8 - 35.4 g/dL   RDW, POC 21.5 %   Platelet Count, POC 354 142 - 424 K/uL   MPV 6.8 0 - 99.8 fL  IFOBT POC (occult bld, rslt in office)     Status: None   Collection Time: 03/13/15 10:22 AM  Result Value Ref Range   IFOBT Positive    Assessment and Plan :   1. Anemia, unspecified anemia type 2. Other fatigue 3. Bilateral leg and foot pain - severe anemia of unclear etiology concerning, discussed differential with patient, she agreed to close followup and will see Dr. Joseph Art tomorrow on 03/14/2015 - Per patient her recent colonoscopy results are reassuring although patient is a poor historian, will repeat CBC in the morning since fecal occult blood test was positive in clinic today  4. Hypothyroidism, unspecified hypothyroidism type 5. Constipation, unspecified constipation type - labs pending, consider starting thyroid medication  6. Essential hypertension - not currently managed with medication, unclear who was following patient - Labs pending, will consider starting hypertensive therapy  7. Gastroesophageal reflux disease, esophagitis presence not specified - labs pending, consider restarting PPI if patient becomes symptomatic  8. Abnormal laboratory test result - labs pending  Jaynee Eagles, PA-C [facility] [phone] 03/13/2015 8:32 AM

## 2015-03-14 ENCOUNTER — Ambulatory Visit (INDEPENDENT_AMBULATORY_CARE_PROVIDER_SITE_OTHER): Payer: BLUE CROSS/BLUE SHIELD | Admitting: Family Medicine

## 2015-03-14 LAB — SEDIMENTATION RATE: Sed Rate: 36 mm/hr — ABNORMAL HIGH (ref 0–30)

## 2015-03-15 ENCOUNTER — Ambulatory Visit (INDEPENDENT_AMBULATORY_CARE_PROVIDER_SITE_OTHER): Payer: BLUE CROSS/BLUE SHIELD | Admitting: Internal Medicine

## 2015-03-15 VITALS — BP 120/80 | HR 87 | Temp 98.2°F | Resp 16 | Ht 62.25 in | Wt 107.0 lb

## 2015-03-15 DIAGNOSIS — D62 Acute posthemorrhagic anemia: Secondary | ICD-10-CM

## 2015-03-15 DIAGNOSIS — M6250 Muscle wasting and atrophy, not elsewhere classified, unspecified site: Secondary | ICD-10-CM

## 2015-03-15 DIAGNOSIS — E039 Hypothyroidism, unspecified: Secondary | ICD-10-CM

## 2015-03-15 DIAGNOSIS — K759 Inflammatory liver disease, unspecified: Secondary | ICD-10-CM

## 2015-03-15 DIAGNOSIS — K921 Melena: Secondary | ICD-10-CM | POA: Diagnosis not present

## 2015-03-15 LAB — RETICULOCYTES
ABS Retic: 42.9 10*3/uL (ref 19.0–186.0)
RBC.: 3.3 MIL/uL — ABNORMAL LOW (ref 3.87–5.11)
Retic Ct Pct: 1.3 % (ref 0.4–2.3)

## 2015-03-15 LAB — POCT CBC
Granulocyte percent: 70.6 %G (ref 37–80)
HCT, POC: 28.8 % — AB (ref 37.7–47.9)
Hemoglobin: 8.3 g/dL — AB (ref 12.2–16.2)
Lymph, poc: 1.6 (ref 0.6–3.4)
MCH, POC: 24.8 pg — AB (ref 27–31.2)
MCHC: 28.9 g/dL — AB (ref 31.8–35.4)
MCV: 85.8 fL (ref 80–97)
MID (cbc): 0.7 (ref 0–0.9)
MPV: 6.5 fL (ref 0–99.8)
POC Granulocyte: 5.5 (ref 2–6.9)
POC LYMPH PERCENT: 20 %L (ref 10–50)
POC MID %: 9.4 %M (ref 0–12)
Platelet Count, POC: 409 10*3/uL (ref 142–424)
RBC: 3.36 M/uL — AB (ref 4.04–5.48)
RDW, POC: 22 %
WBC: 7.8 10*3/uL (ref 4.6–10.2)

## 2015-03-15 LAB — HEPATIC FUNCTION PANEL
ALT: 33 U/L (ref 0–35)
AST: 138 U/L — ABNORMAL HIGH (ref 0–37)
Albumin: 4.4 g/dL (ref 3.5–5.2)
Alkaline Phosphatase: 263 U/L — ABNORMAL HIGH (ref 39–117)
Bilirubin, Direct: 0.2 mg/dL (ref 0.0–0.3)
Indirect Bilirubin: 0.4 mg/dL (ref 0.2–1.2)
Total Bilirubin: 0.6 mg/dL (ref 0.2–1.2)
Total Protein: 7.8 g/dL (ref 6.0–8.3)

## 2015-03-15 LAB — HEPATITIS PANEL, ACUTE
HCV Ab: NEGATIVE
Hep A IgM: NONREACTIVE
Hep B C IgM: NONREACTIVE
Hepatitis B Surface Ag: NEGATIVE

## 2015-03-15 LAB — IRON AND TIBC
%SAT: 5 % — ABNORMAL LOW (ref 20–55)
Iron: 21 ug/dL — ABNORMAL LOW (ref 42–145)
TIBC: 461 ug/dL (ref 250–470)
UIBC: 440 ug/dL — ABNORMAL HIGH (ref 125–400)

## 2015-03-15 LAB — FERRITIN: Ferritin: 32 ng/mL (ref 10–291)

## 2015-03-15 LAB — CK: Total CK: 45 U/L (ref 7–177)

## 2015-03-15 NOTE — Progress Notes (Signed)
   Subjective:    Patient ID: Holly Phelps, female    DOB: 03-13-1961, 54 y.o.   MRN: [id number]  HPI  I, Niger Simpson CMA am scribing for Dr. Elder Cyphers.  Patient is following up with Dr. Elder Cyphers. She has an inflammed liver possibly due to alcohol abuse and hepatitis but not sure.. Today her blood count will be repeated. She is anemic and has blood in her stool. Patient states she has had a colonoscopy 6 months ago done at [facility]. Originally patient saw Dr. Benson Norway but was unsatisfied. She was hospitalized after having an upper GI. Unable to eat due to procedure. Her leg pain is better.Walking without problem.   Review of Systems     Objective:   Physical Exam  Constitutional: She is oriented to person, place, and time. No distress.  HENT:  Head: Normocephalic.  Mouth/Throat: Oropharynx is clear and moist.  Eyes: EOM are normal. No scleral icterus.  Neck: Normal range of motion.  Cardiovascular: Normal rate.   Pulmonary/Chest: Effort normal and breath sounds normal.  Abdominal: Soft. She exhibits no mass. There is no tenderness.  Musculoskeletal: Normal range of motion.  Neurological: She is alert and oriented to person, place, and time.  Psychiatric: She has a normal mood and affect. Her behavior is normal.    Results for orders placed or performed in visit on 03/15/15  POCT CBC  Result Value Ref Range   WBC 7.8 4.6 - 10.2 K/uL   Lymph, poc 1.6 0.6 - 3.4   POC LYMPH PERCENT 20.0 10 - 50 %L   MID (cbc) 0.7 0 - 0.9   POC MID % 9.4 0 - 12 %M   POC Granulocyte 5.5 2 - 6.9   Granulocyte percent 70.6 37 - 80 %G   RBC 3.36 (A) 4.04 - 5.48 M/uL   Hemoglobin 8.3 (A) 12.2 - 16.2 g/dL   HCT, POC 28.8 (A) 37.7 - 47.9 %   MCV 85.8 80 - 97 fL   MCH, POC 24.8 (A) 27 - 31.2 pg   MCHC 28.9 (A) 31.8 - 35.4 g/dL   RDW, POC 22.0 %   Platelet Count, POC 409 142 - 424 K/uL   MPV 6.5 0 - 99.8 fL   Stable anemia cause unclear Order Ferritin, reticulocyte count, Iron/TIBC         Assessment & Plan:  Anemia Hepatitis Muscle wasting Legs ache Blood in stool Return 1 week for further work up  Stop all alcohol consumption

## 2015-03-15 NOTE — Patient Instructions (Signed)
Anemia, Nonspecific Anemia is a condition in which the concentration of red blood cells or hemoglobin in the blood is below normal. Hemoglobin is a substance in red blood cells that carries oxygen to the tissues of the body. Anemia results in not enough oxygen reaching these tissues.  CAUSES  Common causes of anemia include:   Excessive bleeding. Bleeding may be internal or external. This includes excessive bleeding from periods (in women) or from the intestine.   Poor nutrition.   Chronic kidney, thyroid, and liver disease.  Bone marrow disorders that decrease red blood cell production.  Cancer and treatments for cancer.  HIV, AIDS, and their treatments.  Spleen problems that increase red blood cell destruction.  Blood disorders.  Excess destruction of red blood cells due to infection, medicines, and autoimmune disorders. SIGNS AND SYMPTOMS   Minor weakness.   Dizziness.   Headache.  Palpitations.   Shortness of breath, especially with exercise.   Paleness.  Cold sensitivity.  Indigestion.  Nausea.  Difficulty sleeping.  Difficulty concentrating. Symptoms may occur suddenly or they may develop slowly.  DIAGNOSIS  Additional blood tests are often needed. These help your health care provider determine the best treatment. Your health care provider will check your stool for blood and look for other causes of blood loss.  TREATMENT  Treatment varies depending on the cause of the anemia. Treatment can include:   Supplements of iron, vitamin X21, or folic acid.   Hormone medicines.   A blood transfusion. This may be needed if blood loss is severe.   Hospitalization. This may be needed if there is significant continual blood loss.   Dietary changes.  Spleen removal. HOME CARE INSTRUCTIONS Keep all follow-up appointments. It often takes many weeks to correct anemia, and having your health care provider check on your condition and your response to  treatment is very important. SEEK IMMEDIATE MEDICAL CARE IF:   You develop extreme weakness, shortness of breath, or chest pain.   You become dizzy or have trouble concentrating.  You develop heavy vaginal bleeding.   You develop a rash.   You have bloody or black, tarry stools.   You faint.   You vomit up blood.   You vomit repeatedly.   You have abdominal pain.  You have a fever or persistent symptoms for more than 2-3 days.   You have a fever and your symptoms suddenly get worse.   You are dehydrated.  MAKE SURE YOU:  Understand these instructions.  Will watch your condition.  Will get help right away if you are not doing well or get worse. Document Released: 12/13/2004 Document Revised: 07/08/2013 Document Reviewed: 05/01/2013 [facility] Patient Information 2015 [place], [place]. This information is not intended to replace advice given to you by your health care provider. Make sure you discuss any questions you have with your health care provider. Smoking Cessation Quitting smoking is important to your health and has many advantages. However, it is not always easy to quit since nicotine is a very addictive drug. Oftentimes, people try 3 times or more before being able to quit. This document explains the best ways for you to prepare to quit smoking. Quitting takes hard work and a lot of effort, but you can do it. ADVANTAGES OF QUITTING SMOKING  You will live longer, feel better, and live better.  Your body will feel the impact of quitting smoking almost immediately.  Within 20 minutes, blood pressure decreases. Your pulse returns to its normal level.  After  8 hours, carbon monoxide levels in the blood return to normal. Your oxygen level increases.  After 24 hours, the chance of having a heart attack starts to decrease. Your breath, hair, and body stop smelling like smoke.  After 48 hours, damaged nerve endings begin to recover. Your sense of taste and smell  improve.  After 72 hours, the body is virtually free of nicotine. Your bronchial tubes relax and breathing becomes easier.  After 2 to 12 weeks, lungs can hold more air. Exercise becomes easier and circulation improves.  The risk of having a heart attack, stroke, cancer, or lung disease is greatly reduced.  After 1 year, the risk of coronary heart disease is cut in half.  After 5 years, the risk of stroke falls to the same as a nonsmoker.  After 10 years, the risk of lung cancer is cut in half and the risk of other cancers decreases significantly.  After 15 years, the risk of coronary heart disease drops, usually to the level of a nonsmoker.  If you are pregnant, quitting smoking will improve your chances of having a healthy baby.  The people you live with, especially any children, will be healthier.  You will have extra money to spend on things other than cigarettes. QUESTIONS TO THINK ABOUT BEFORE ATTEMPTING TO QUIT You may want to talk about your answers with your health care provider.  Why do you want to quit?  If you tried to quit in the past, what helped and what did not?  What will be the most difficult situations for you after you quit? How will you plan to handle them?  Who can help you through the tough times? Your family? Friends? A health care provider?  What pleasures do you get from smoking? What ways can you still get pleasure if you quit? Here are some questions to ask your health care provider:  How can you help me to be successful at quitting?  What medicine do you think would be best for me and how should I take it?  What should I do if I need more help?  What is smoking withdrawal like? How can I get information on withdrawal? GET READY  Set a quit date.  Change your environment by getting rid of all cigarettes, ashtrays, matches, and lighters in your home, car, or work. Do not let people smoke in your home.  Review your past attempts to quit. Think  about what worked and what did not. GET SUPPORT AND ENCOURAGEMENT You have a better chance of being successful if you have help. You can get support in many ways.  Tell your family, friends, and coworkers that you are going to quit and need their support. Ask them not to smoke around you.  Get individual, group, or telephone counseling and support. Programs are available at [employer] and health centers. Call your local health department for information about programs in your area.  Spiritual beliefs and practices may help some smokers quit.  Download a "quit meter" on your computer to keep track of quit statistics, such as how long you have gone without smoking, cigarettes not smoked, and money saved.  Get a self-help book about quitting smoking and staying off tobacco. [place] yourself from urges to smoke. Talk to someone, go for a walk, or occupy your time with a task.  Change your normal routine. Take a different route to work. Drink tea instead of coffee. Eat breakfast in a  different place.  Reduce your stress. Take a hot bath, exercise, or read a book.  Plan something enjoyable to do every day. Reward yourself for not smoking.  Explore interactive web-based programs that specialize in helping you quit. GET MEDICINE AND USE IT CORRECTLY Medicines can help you stop smoking and decrease the urge to smoke. Combining medicine with the above behavioral methods and support can greatly increase your chances of successfully quitting smoking.  Nicotine replacement therapy helps deliver nicotine to your body without the negative effects and risks of smoking. Nicotine replacement therapy includes nicotine gum, lozenges, inhalers, nasal sprays, and skin patches. Some may be available over-the-counter and others require a prescription.  Antidepressant medicine helps people abstain from smoking, but how this works is unknown. This medicine is available by  prescription.  Nicotinic receptor partial agonist medicine simulates the effect of nicotine in your brain. This medicine is available by prescription. Ask your health care provider for advice about which medicines to use and how to use them based on your health history. Your health care provider will tell you what side effects to look out for if you choose to be on a medicine or therapy. Carefully read the information on the package. Do not use any other product containing nicotine while using a nicotine replacement product.  RELAPSE OR DIFFICULT SITUATIONS Most relapses occur within the first 3 months after quitting. Do not be discouraged if you start smoking again. Remember, most people try several times before finally quitting. You may have symptoms of withdrawal because your body is used to nicotine. You may crave cigarettes, be irritable, feel very hungry, cough often, get headaches, or have difficulty concentrating. The withdrawal symptoms are only temporary. They are strongest when you first quit, but they will go away within 10-14 days. To reduce the chances of relapse, try to:  Avoid drinking alcohol. Drinking lowers your chances of successfully quitting.  Reduce the amount of caffeine you consume. Once you quit smoking, the amount of caffeine in your body increases and can give you symptoms, such as a rapid heartbeat, sweating, and anxiety.  Avoid smokers because they can make you want to smoke.  Do not let weight gain distract you. Many smokers will gain weight when they quit, usually less than 10 pounds. Eat a healthy diet and stay active. You can always lose the weight gained after you quit.  Find ways to improve your mood other than smoking. FOR MORE INFORMATION  www.smokefree.gov  Document Released: 10/30/2001 Document Revised: 03/22/2014 Document Reviewed: 02/14/2012 [facility] Patient Information 2015 [place], [place]. This information is not intended to replace advice given to you  by your health care provider. Make sure you discuss any questions you have with your health care provider. Alcohol Use Disorder Alcohol use disorder is a mental disorder. It is not a one-time incident of heavy drinking. Alcohol use disorder is the excessive and uncontrollable use of alcohol over time that leads to problems with functioning in one or more areas of daily living. People with this disorder risk harming themselves and others when they drink to excess. Alcohol use disorder also can cause other mental disorders, such as mood and anxiety disorders, and serious physical problems. People with alcohol use disorder often misuse other drugs.  Alcohol use disorder is common and widespread. Some people with this disorder drink alcohol to cope with or escape from negative life events. Others drink to relieve chronic pain or symptoms of mental illness. People with a family history of alcohol  use disorder are at higher risk of losing control and using alcohol to excess.  SYMPTOMS  Signs and symptoms of alcohol use disorder may include the following:   Consumption ofalcohol inlarger amounts or over a longer period of time than intended.  Multiple unsuccessful attempts to cutdown or control alcohol use.   A great deal of time spent obtaining alcohol, using alcohol, or recovering from the effects of alcohol (hangover).  A strong desire or urge to use alcohol (cravings).   Continued use of alcohol despite problems at work, school, or home because of alcohol use.   Continued use of alcohol despite problems in relationships because of alcohol use.  Continued use of alcohol in situations when it is physically hazardous, such as driving a car.  Continued use of alcohol despite awareness of a physical or psychological problem that is likely related to alcohol use. Physical problems related to alcohol use can involve the brain, heart, liver, stomach, and intestines. Psychological problems related to  alcohol use include intoxication, depression, anxiety, psychosis, delirium, and dementia.   The need for increased amounts of alcohol to achieve the same desired effect, or a decreased effect from the consumption of the same amount of alcohol (tolerance).  Withdrawal symptoms upon reducing or stopping alcohol use, or alcohol use to reduce or avoid withdrawal symptoms. Withdrawal symptoms include:  Racing heart.  Hand tremor.  Difficulty sleeping.  Nausea.  Vomiting.  Hallucinations.  Restlessness.  Seizures. DIAGNOSIS Alcohol use disorder is diagnosed through an assessment by your health care provider. Your health care provider may start by asking three or four questions to screen for excessive or problematic alcohol use. To confirm a diagnosis of alcohol use disorder, at least two symptoms must be present within a 56-month period. The severity of alcohol use disorder depends on the number of symptoms:  Mild--two or three.  Moderate--four or five.  Severe--six or more. Your health care provider may perform a physical exam or use results from lab tests to see if you have physical problems resulting from alcohol use. Your health care provider may refer you to a mental health professional for evaluation. TREATMENT  Some people with alcohol use disorder are able to reduce their alcohol use to low-risk levels. Some people with alcohol use disorder need to quit drinking alcohol. When necessary, mental health professionals with specialized training in substance use treatment can help. Your health care provider can help you decide how severe your alcohol use disorder is and what type of treatment you need. The following forms of treatment are available:   Detoxification. Detoxification involves the use of prescription medicines to prevent alcohol withdrawal symptoms in the first week after quitting. This is important for people with a history of symptoms of withdrawal and for heavy drinkers  who are likely to have withdrawal symptoms. Alcohol withdrawal can be dangerous and, in severe cases, cause death. Detoxification is usually provided in a hospital or in-patient substance use treatment facility.  Counseling or talk therapy. Talk therapy is provided by substance use treatment counselors. It addresses the reasons people use alcohol and ways to keep them from drinking again. The goals of talk therapy are to help people with alcohol use disorder find healthy activities and ways to cope with life stress, to identify and avoid triggers for alcohol use, and to handle cravings, which can cause relapse.  Medicines.Different medicines can help treat alcohol use disorder through the following actions:  Decrease alcohol cravings.  Decrease the positive reward response felt  from alcohol use.  Produce an uncomfortable physical reaction when alcohol is used (aversion therapy).  Support groups. Support groups are run by people who have quit drinking. They provide emotional support, advice, and guidance. These forms of treatment are often combined. Some people with alcohol use disorder benefit from intensive combination treatment provided by specialized substance use treatment centers. Both inpatient and outpatient treatment programs are available. Document Released: 12/13/2004 Document Revised: 03/22/2014 Document Reviewed: 02/12/2013 [facility] Patient Information 2015 [place], [place]. This information is not intended to replace advice given to you by your health care provider. Make sure you discuss any questions you have with your health care provider.

## 2015-03-17 NOTE — Progress Notes (Signed)
lmom to call back to set an appt up with Holly Phelps for a f/u

## 2015-03-21 ENCOUNTER — Ambulatory Visit (INDEPENDENT_AMBULATORY_CARE_PROVIDER_SITE_OTHER): Payer: BLUE CROSS/BLUE SHIELD | Admitting: Urgent Care

## 2015-03-21 VITALS — BP 132/80 | HR 131 | Temp 98.1°F | Resp 18 | Ht 62.0 in | Wt 109.0 lb

## 2015-03-21 DIAGNOSIS — E039 Hypothyroidism, unspecified: Secondary | ICD-10-CM | POA: Diagnosis not present

## 2015-03-21 DIAGNOSIS — D649 Anemia, unspecified: Secondary | ICD-10-CM

## 2015-03-21 DIAGNOSIS — M6250 Muscle wasting and atrophy, not elsewhere classified, unspecified site: Secondary | ICD-10-CM

## 2015-03-21 DIAGNOSIS — K759 Inflammatory liver disease, unspecified: Secondary | ICD-10-CM | POA: Diagnosis not present

## 2015-03-21 DIAGNOSIS — K921 Melena: Secondary | ICD-10-CM | POA: Diagnosis not present

## 2015-03-21 DIAGNOSIS — D62 Acute posthemorrhagic anemia: Secondary | ICD-10-CM

## 2015-03-21 LAB — POCT URINALYSIS DIPSTICK
Bilirubin, UA: NEGATIVE
Blood, UA: NEGATIVE
Glucose, UA: NEGATIVE
Ketones, UA: NEGATIVE
Nitrite, UA: POSITIVE
Protein, UA: NEGATIVE
Spec Grav, UA: 1.02
Urobilinogen, UA: 0.2
pH, UA: 8.5

## 2015-03-21 LAB — POCT UA - MICROSCOPIC ONLY
Casts, Ur, LPF, POC: NEGATIVE
Crystals, Ur, HPF, POC: NEGATIVE
Epithelial cells, urine per micros: NEGATIVE
Mucus, UA: NEGATIVE
Yeast, UA: NEGATIVE

## 2015-03-21 NOTE — Patient Instructions (Signed)
- Please take iron supplement, you can pick this up over the counter. Take docusate (Colace) with it, twice a day to avoid constipation associated with iron supplement.    Iron Deficiency Anemia Anemia is a condition in which there are less red blood cells or hemoglobin in the blood than normal. Hemoglobin is the part of red blood cells that carries oxygen. Iron deficiency anemia is anemia caused by too little iron. It is the most common type of anemia. It may leave you tired and short of breath. CAUSES   Lack of iron in the diet.  Poor absorption of iron, as seen with intestinal disorders.  Intestinal bleeding.  Heavy periods. SIGNS AND SYMPTOMS  Mild anemia may not be noticeable. Symptoms may include:  Fatigue.  Headache.  Pale skin.  Weakness.  Tiredness.  Shortness of breath.  Dizziness.  Cold hands and feet.  Fast or irregular heartbeat. DIAGNOSIS  Diagnosis requires a thorough evaluation and physical exam by your health care provider. Blood tests are generally used to confirm iron deficiency anemia. Additional tests may be done to find the underlying cause of your anemia. These may include:  Testing for blood in the stool (fecal occult blood test).  A procedure to see inside the colon and rectum (colonoscopy).  A procedure to see inside the esophagus and stomach (endoscopy). TREATMENT  Iron deficiency anemia is treated by correcting the cause of the deficiency. Treatment may involve:  Adding iron-rich foods to your diet.  Taking iron supplements. Pregnant or breastfeeding women need to take extra iron because their normal diet usually does not provide the required amount.  Taking vitamins. Vitamin C improves the absorption of iron. Your health care provider may recommend that you take your iron tablets with a glass of orange juice or vitamin C supplement.  Medicines to make heavy menstrual flow lighter.  Surgery. HOME CARE INSTRUCTIONS   Take iron as  directed by your health care provider.  If you cannot tolerate taking iron supplements by mouth, talk to your health care provider about taking them through a vein (intravenously) or an injection into a muscle.  For the best iron absorption, iron supplements should be taken on an empty stomach. If you cannot tolerate them on an empty stomach, you may need to take them with food.  Do not drink milk or take antacids at the same time as your iron supplements. Milk and antacids may interfere with the absorption of iron.  Iron supplements can cause constipation. Make sure to include fiber in your diet to prevent constipation. A stool softener may also be recommended.  Take vitamins as directed by your health care provider.  Eat a diet rich in iron. Foods high in iron include liver, lean beef, whole-grain bread, eggs, dried fruit, and dark green leafy vegetables. SEEK IMMEDIATE MEDICAL CARE IF:   You faint. If this happens, do not drive. Call your local emergency services (911 in [place] if no other help is available.  You have chest pain.  You feel nauseous or vomit.  You have severe or increased shortness of breath with activity.  You feel weak.  You have a rapid heartbeat.  You have unexplained sweating.  You become light-headed when getting up from a chair or bed. MAKE SURE YOU:   Understand these instructions.  Will watch your condition.  Will get help right away if you are not doing well or get worse. Document Released: 11/02/2000 Document Revised: 11/10/2013 Document Reviewed: 07/13/2013 [employer] Patient Information 2015  [employer]. This information is not intended to replace advice given to you by your health care provider. Make sure you discuss any questions you have with your health care provider.

## 2015-03-21 NOTE — Progress Notes (Signed)
MRN: [id number] DOB: Mar 18, 1961  Subjective:   Holly Phelps is a 54 y.o. female presenting for chief complaint of Follow-up and Anemia  Provider was not notified that patient was here to follow up with PA-Sharunda Salmon. Patient waited ~2 hours in an exam room and was unwilling stay any longer. She briefly reported that she still has intermittent leg pain. Has not started any new medications. Denied chest pain, shob, frank blood in stool, abdominal pain, n/v, hematemesis. She reports that she stopped drinking alcohol. Of note, patient did provide urine sample but left before result was reported. Denies any other aggravating or relieving factors, no other questions or concerns.  Holly Phelps has a current medication list which includes the following prescription(s): cranberry-vitamin c-probiotic, multivitamin with minerals, and omeprazole. She is allergic to codeine; flexeril; sulfa antibiotics; and synthroid.  Holly Phelps  has a past medical history of Allergy; Anemia; Tuberculosis; Hypertension; Hypothyroidism; and GERD (gastroesophageal reflux disease). Also  has past surgical history that includes Laparoscopic appendectomy; Back surgery; and Colonoscopy (N/A, 03/13/2013).  ROS As in subjective.  Objective:   Vitals: BP 132/80 mmHg  Pulse 131  Temp(Src) 98.1 F (36.7 C) (Oral)  Resp 18  Ht 5\' 2"  (1.575 m)  Wt 109 lb (49.442 kg)  BMI 19.93 kg/m2  SpO2 97%  Physical Exam  Constitutional: She is oriented to person, place, and time.  Cardiovascular: Regular rhythm and intact distal pulses.  Exam reveals no gallop and no friction rub.   No murmur heard. Tachycardic, patient admits that she had not eaten or drank since the evening before her visit.  Pulmonary/Chest: No respiratory distress. She has no wheezes. She has no rales. She exhibits no tenderness.  Abdominal: Soft. Bowel sounds are normal. She exhibits no distension. There is no tenderness.  Musculoskeletal:  Muscle wasting in extremities.    Neurological: She is alert and oriented to person, place, and time.  Skin: Skin is warm and dry. No rash noted. No erythema. There is pallor.   Results for orders placed or performed in visit on 03/21/15 (from the past 24 hour(s))  POCT urinalysis dipstick     Status: None   Collection Time: 03/21/15  1:27 PM  Result Value Ref Range   Color, UA yellow    Clarity, UA cloudy    Glucose, UA neg    Bilirubin, UA neg    Ketones, UA neg    Spec Grav, UA 1.020    Blood, UA neg    pH, UA 8.5    Protein, UA neg    Urobilinogen, UA 0.2    Nitrite, UA positive    Leukocytes, UA Trace   POCT UA - Microscopic Only     Status: None   Collection Time: 03/21/15  1:27 PM  Result Value Ref Range   WBC, Ur, HPF, POC 1-2    RBC, urine, microscopic 1-2    Bacteria, U Microscopic 2+    Mucus, UA neg    Epithelial cells, urine per micros neg    Crystals, Ur, HPF, POC neg    Casts, Ur, LPF, POC neg    Yeast, UA neg    Assessment and Plan :   1. Blood in stool 2. Anemia, unspecified anemia type 3. Acute blood loss anemia - Recommended patient start over-the-counter iron supplement with stool softener to prevent constipation, patient stated she would pick this up on her own. - I also suggested to patient that we need to referral to GI for further work-up.  Will follow up with patient by phone  4. Muscle wasting - Unclear etiology, will continue work-up  5. Hypothyroidism, unspecified hypothyroidism type - Stable, will hold off on starting medications  6. Hepatitis - Likely due to alcohol use, patient confirms that she is no longer drinking alcohol  7. Urinalysis obtained due to Dr. Luiz Ochoa order for UA at her previous visit. Patient left before it could be resulted due to long waiting time. Will follow up by phone.  Jaynee Eagles, PA-C [facility] [phone] 03/21/2015 1:03 PM

## 2015-03-24 ENCOUNTER — Telehealth: Payer: Self-pay

## 2015-03-24 NOTE — Telephone Encounter (Signed)
Pt calling about labs. Please review. Thanks  

## 2015-03-25 NOTE — Telephone Encounter (Signed)
Rosario Adie is her primary care provider

## 2015-03-25 NOTE — Telephone Encounter (Signed)
Dr Elder Cyphers-- Pt seen by YOU on 03/15/15 and has never gotten lab results. Can you please review these please?

## 2015-03-25 NOTE — Telephone Encounter (Signed)
You have an anemia of a mixed type, you are iron deficient, you have elevated liver function tests that may be related to alcohol intake. Please stop all alcohol and return to our clinic and see your primary care person Holly Phelps PAc. You need further evaluation.

## 2015-04-01 NOTE — Telephone Encounter (Signed)
Left message on machine to call back  

## 2015-04-24 ENCOUNTER — Encounter (HOSPITAL_COMMUNITY): Payer: Self-pay | Admitting: *Deleted

## 2015-04-24 ENCOUNTER — Emergency Department (HOSPITAL_COMMUNITY): Payer: BLUE CROSS/BLUE SHIELD

## 2015-04-24 ENCOUNTER — Inpatient Hospital Stay (HOSPITAL_COMMUNITY)
Admission: EM | Admit: 2015-04-24 | Discharge: 2015-04-27 | DRG: 690 | Disposition: A | Discharge status: Home / Self Care | Payer: BLUE CROSS/BLUE SHIELD | Attending: Internal Medicine | Admitting: Internal Medicine

## 2015-04-24 DIAGNOSIS — Z885 Allergy status to narcotic agent status: Secondary | ICD-10-CM | POA: Diagnosis not present

## 2015-04-24 DIAGNOSIS — F1010 Alcohol abuse, uncomplicated: Secondary | ICD-10-CM | POA: Diagnosis present

## 2015-04-24 DIAGNOSIS — F17210 Nicotine dependence, cigarettes, uncomplicated: Secondary | ICD-10-CM | POA: Diagnosis present

## 2015-04-24 DIAGNOSIS — E860 Dehydration: Secondary | ICD-10-CM | POA: Diagnosis present

## 2015-04-24 DIAGNOSIS — G43A1 Cyclical vomiting, intractable: Secondary | ICD-10-CM | POA: Diagnosis not present

## 2015-04-24 DIAGNOSIS — N390 Urinary tract infection, site not specified: Secondary | ICD-10-CM | POA: Diagnosis present

## 2015-04-24 DIAGNOSIS — R000 Tachycardia, unspecified: Secondary | ICD-10-CM | POA: Diagnosis present

## 2015-04-24 DIAGNOSIS — K209 Esophagitis, unspecified without bleeding: Secondary | ICD-10-CM | POA: Diagnosis present

## 2015-04-24 DIAGNOSIS — I10 Essential (primary) hypertension: Secondary | ICD-10-CM | POA: Diagnosis present

## 2015-04-24 DIAGNOSIS — Z823 Family history of stroke: Secondary | ICD-10-CM | POA: Diagnosis not present

## 2015-04-24 DIAGNOSIS — E876 Hypokalemia: Secondary | ICD-10-CM | POA: Diagnosis present

## 2015-04-24 DIAGNOSIS — K219 Gastro-esophageal reflux disease without esophagitis: Secondary | ICD-10-CM | POA: Diagnosis present

## 2015-04-24 DIAGNOSIS — Z882 Allergy status to sulfonamides status: Secondary | ICD-10-CM | POA: Diagnosis not present

## 2015-04-24 DIAGNOSIS — K210 Gastro-esophageal reflux disease with esophagitis: Secondary | ICD-10-CM | POA: Diagnosis present

## 2015-04-24 DIAGNOSIS — R112 Nausea with vomiting, unspecified: Secondary | ICD-10-CM | POA: Diagnosis present

## 2015-04-24 DIAGNOSIS — Z79899 Other long term (current) drug therapy: Secondary | ICD-10-CM | POA: Diagnosis not present

## 2015-04-24 DIAGNOSIS — D72829 Elevated white blood cell count, unspecified: Secondary | ICD-10-CM | POA: Diagnosis present

## 2015-04-24 DIAGNOSIS — Z888 Allergy status to other drugs, medicaments and biological substances status: Secondary | ICD-10-CM | POA: Diagnosis not present

## 2015-04-24 DIAGNOSIS — D509 Iron deficiency anemia, unspecified: Secondary | ICD-10-CM | POA: Diagnosis present

## 2015-04-24 DIAGNOSIS — N3000 Acute cystitis without hematuria: Secondary | ICD-10-CM | POA: Diagnosis not present

## 2015-04-24 HISTORY — DX: Headache: R51

## 2015-04-24 HISTORY — DX: Headache, unspecified: R51.9

## 2015-04-24 LAB — COMPREHENSIVE METABOLIC PANEL
ALT: 38 U/L (ref 14–54)
AST: 168 U/L — ABNORMAL HIGH (ref 15–41)
Albumin: 4.2 g/dL (ref 3.5–5.0)
Alkaline Phosphatase: 367 U/L — ABNORMAL HIGH (ref 38–126)
Anion gap: 25 — ABNORMAL HIGH (ref 5–15)
BUN: 9 mg/dL (ref 6–20)
CO2: 19 mmol/L — ABNORMAL LOW (ref 22–32)
Calcium: 9.3 mg/dL (ref 8.9–10.3)
Chloride: 94 mmol/L — ABNORMAL LOW (ref 101–111)
Creatinine, Ser: 0.58 mg/dL (ref 0.44–1.00)
GFR calc Af Amer: >60 mL/min (ref >60)
GFR calc non Af Amer: >60 mL/min (ref >60)
Glucose, Bld: 112 mg/dL — ABNORMAL HIGH (ref 65–99)
Potassium: 3.7 mmol/L (ref 3.5–5.1)
Sodium: 138 mmol/L (ref 135–145)
Total Bilirubin: 1.1 mg/dL (ref 0.3–1.2)
Total Protein: 8.4 g/dL — ABNORMAL HIGH (ref 6.5–8.1)

## 2015-04-24 LAB — URINALYSIS, ROUTINE W REFLEX MICROSCOPIC
Glucose, UA: NEGATIVE mg/dL
Hgb urine dipstick: NEGATIVE
Ketones, ur: >80 mg/dL — AB
Nitrite: POSITIVE — AB
Protein, ur: NEGATIVE mg/dL
Specific Gravity, Urine: 1.017 (ref 1.005–1.030)
Urobilinogen, UA: 1 mg/dL (ref 0.0–1.0)
pH: 6.5 (ref 5.0–8.0)

## 2015-04-24 LAB — URINE MICROSCOPIC-ADD ON

## 2015-04-24 LAB — CBC WITH DIFFERENTIAL/PLATELET
Basophils Absolute: 0.1 10*3/uL (ref 0.0–0.1)
Basophils Relative: 1 % (ref 0–1)
Eosinophils Absolute: 0 10*3/uL (ref 0.0–0.7)
Eosinophils Relative: 0 % (ref 0–5)
HCT: 36.1 % (ref 36.0–46.0)
Hemoglobin: 11 g/dL — ABNORMAL LOW (ref 12.0–15.0)
Lymphocytes Relative: 5 % — ABNORMAL LOW (ref 12–46)
Lymphs Abs: 0.7 10*3/uL (ref 0.7–4.0)
MCH: 29.9 pg (ref 26.0–34.0)
MCHC: 30.5 g/dL (ref 30.0–36.0)
MCV: 98.1 fL (ref 78.0–100.0)
Monocytes Absolute: 1.3 10*3/uL — ABNORMAL HIGH (ref 0.1–1.0)
Monocytes Relative: 10 % (ref 3–12)
Neutro Abs: 11.2 10*3/uL — ABNORMAL HIGH (ref 1.7–7.7)
Neutrophils Relative %: 84 % — ABNORMAL HIGH (ref 43–77)
Platelets: 252 10*3/uL (ref 150–400)
RBC: 3.68 MIL/uL — ABNORMAL LOW (ref 3.87–5.11)
RDW: 23 % — ABNORMAL HIGH (ref 11.5–15.5)
WBC: 13.3 10*3/uL — ABNORMAL HIGH (ref 4.0–10.5)

## 2015-04-24 LAB — LIPASE, BLOOD: Lipase: 17 U/L — ABNORMAL LOW (ref 22–51)

## 2015-04-24 LAB — RAPID URINE DRUG SCREEN, HOSP PERFORMED
Amphetamines: NOT DETECTED
Barbiturates: NOT DETECTED
Benzodiazepines: NOT DETECTED
Cocaine: POSITIVE — AB
Opiates: NOT DETECTED
Tetrahydrocannabinol: NOT DETECTED

## 2015-04-24 LAB — I-STAT TROPONIN, ED: Troponin i, poc: 0.01 ng/mL (ref 0.00–0.08)

## 2015-04-24 LAB — I-STAT CG4 LACTIC ACID, ED: Lactic Acid, Venous: 0.79 mmol/L (ref 0.5–2.0)

## 2015-04-24 LAB — TROPONIN I: Troponin I: <0.03 ng/mL (ref <0.031)

## 2015-04-24 LAB — ETHANOL: Alcohol, Ethyl (B): <5 mg/dL (ref <5)

## 2015-04-24 IMAGING — CR DG ABDOMEN ACUTE W/ 1V CHEST
3 series · 3 of 3 positions shown · non-contrast
Comparison: None.

CLINICAL DATA: Migraine headaches with nausea and vomiting
yesterday. The headaches have resolved. Nausea and vomiting
persists.

EXAM:
DG ABDOMEN ACUTE W/ 1V CHEST

[w chest pa]
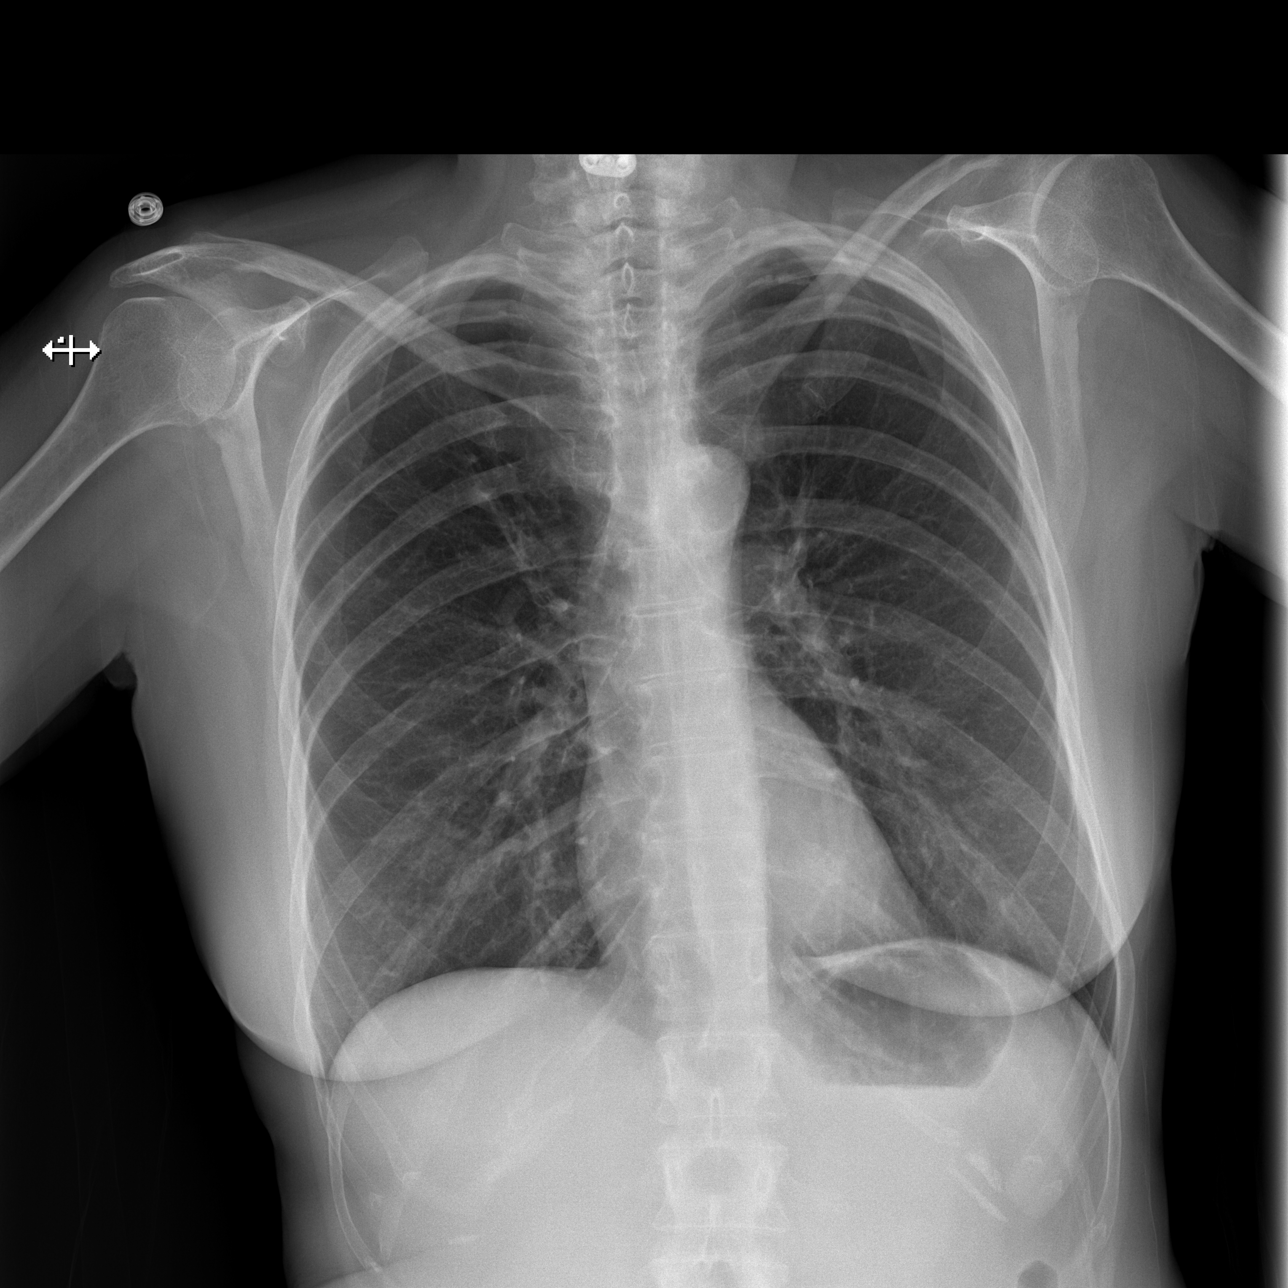

[w abdomen upright]
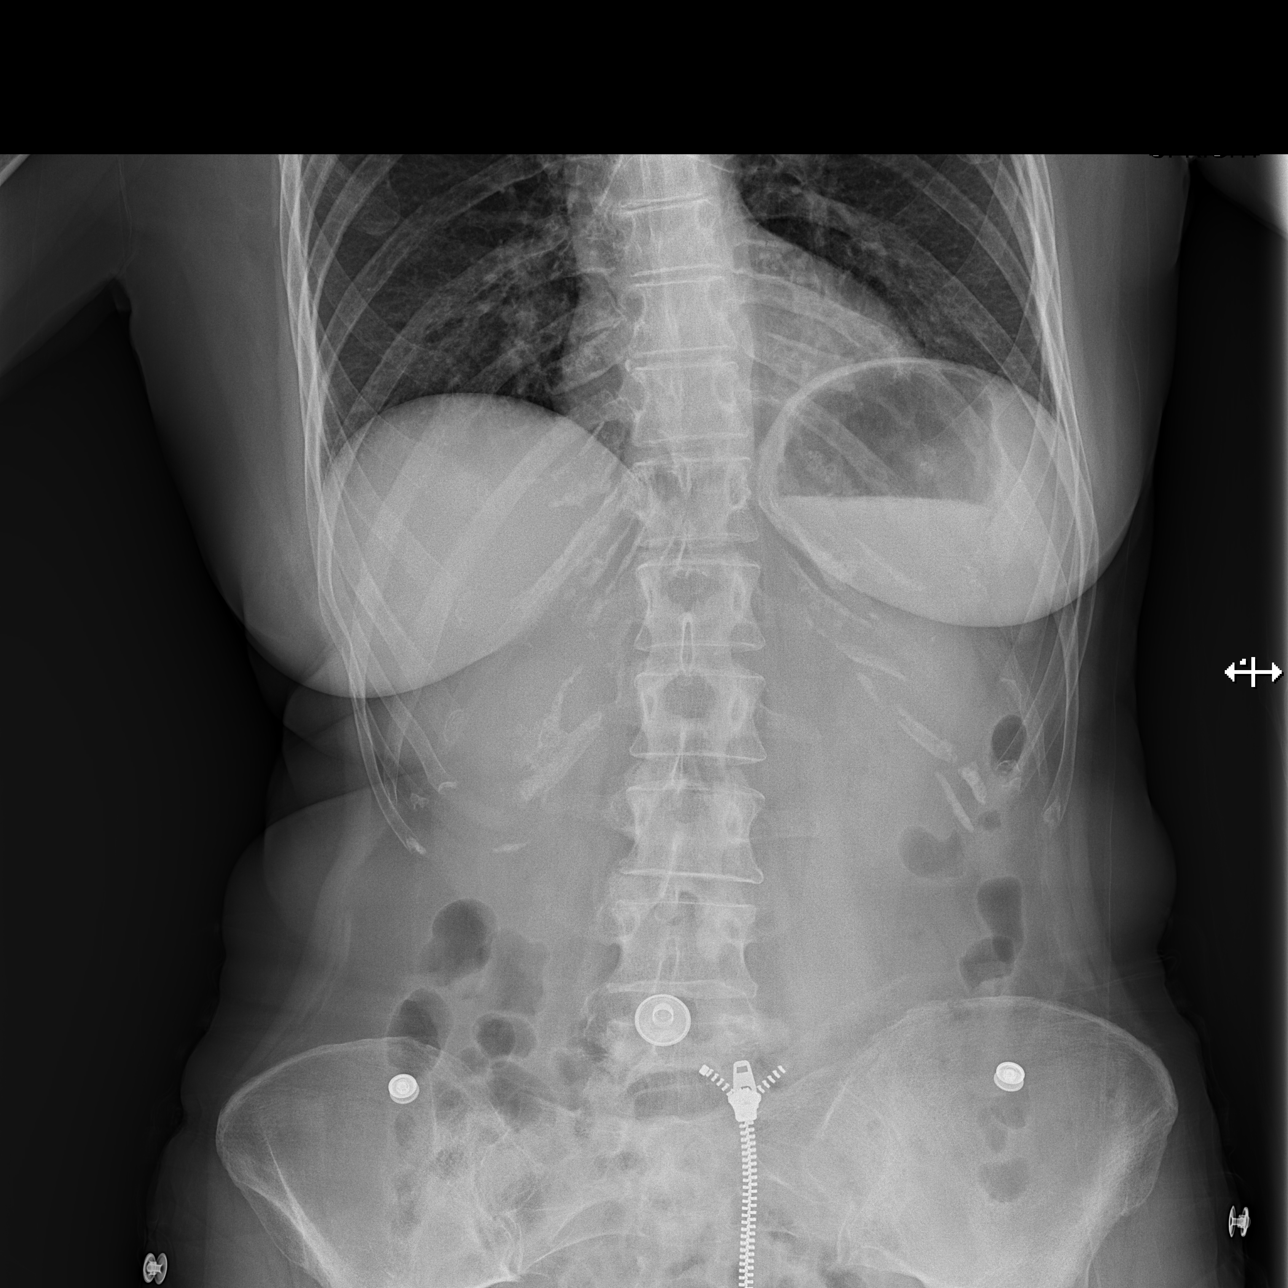

[t abdomen supine]
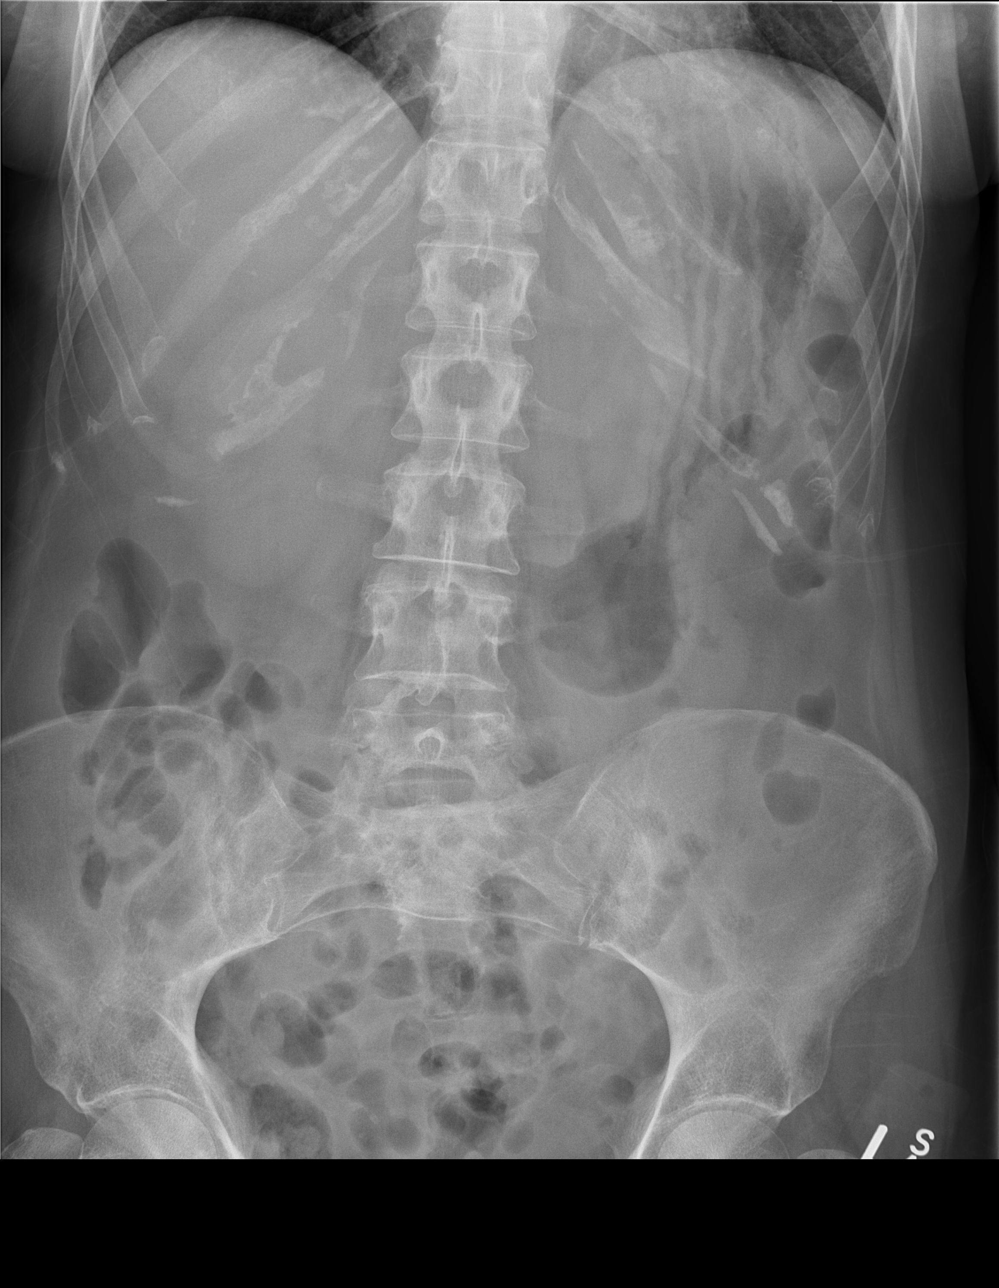

[3 of 3 positions shown; findings below may reference images not displayed]

FINDINGS: There is no evidence of dilated bowel loops or free intraperitoneal
air. No radiopaque calculi or other significant radiographic
abnormality is seen. Heart size and mediastinal contours are within
normal limits. Both lungs are clear.
IMPRESSION: Negative abdominal radiographs.  No acute cardiopulmonary disease.

## 2015-04-24 MED ORDER — FERROUS GLUCONATE 324 (38 FE) MG PO TABS
324.0000 mg | ORAL_TABLET | Freq: Every day | ORAL | Status: DC
  Administered 2015-04-25 – 2015-04-27 (×3): 324 mg via ORAL
  Filled 2015-04-24 (×3): qty 1

## 2015-04-24 MED ORDER — ADULT MULTIVITAMIN W/MINERALS CH
1.0000 | ORAL_TABLET | Freq: Every day | ORAL | Status: DC
  Administered 2015-04-24 – 2015-04-27 (×4): 1 via ORAL
  Filled 2015-04-24 (×4): qty 1

## 2015-04-24 MED ORDER — LORAZEPAM 2 MG/ML IJ SOLN: 1.0000 mg | Freq: Four times a day (QID) | INTRAMUSCULAR | Status: DC | PRN

## 2015-04-24 MED ORDER — ONDANSETRON HCL 4 MG PO TABS: 4.0000 mg | ORAL_TABLET | Freq: Four times a day (QID) | ORAL | Status: DC | PRN

## 2015-04-24 MED ORDER — ALBUTEROL SULFATE (2.5 MG/3ML) 0.083% IN NEBU: 2.5000 mg | INHALATION_SOLUTION | RESPIRATORY_TRACT | Status: DC | PRN

## 2015-04-24 MED ORDER — PANTOPRAZOLE SODIUM 40 MG IV SOLR
40.0000 mg | Freq: Every day | INTRAVENOUS | Status: DC
  Administered 2015-04-24 – 2015-04-27 (×4): 40 mg via INTRAVENOUS
  Filled 2015-04-24 (×4): qty 40

## 2015-04-24 MED ORDER — ENOXAPARIN SODIUM 40 MG/0.4ML ~~LOC~~ SOLN
40.0000 mg | SUBCUTANEOUS | Status: DC
  Administered 2015-04-24 – 2015-04-26 (×3): 40 mg via SUBCUTANEOUS
  Filled 2015-04-24 (×3): qty 0.4

## 2015-04-24 MED ORDER — ACETAMINOPHEN 325 MG PO TABS: 650.0000 mg | ORAL_TABLET | Freq: Four times a day (QID) | ORAL | Status: DC | PRN

## 2015-04-24 MED ORDER — DIPHENHYDRAMINE HCL 50 MG/ML IJ SOLN
12.5000 mg | Freq: Once | INTRAMUSCULAR | Status: AC
  Administered 2015-04-24: 12.5 mg via INTRAVENOUS
  Filled 2015-04-24: qty 1

## 2015-04-24 MED ORDER — ONDANSETRON 4 MG PO TBDP
8.0000 mg | ORAL_TABLET | Freq: Once | ORAL | Status: DC
  Filled 2015-04-24: qty 1

## 2015-04-24 MED ORDER — SODIUM CHLORIDE 0.9 % IV BOLUS (SEPSIS)
1000.0000 mL | Freq: Once | INTRAVENOUS | Status: AC
  Administered 2015-04-24: 1000 mL via INTRAVENOUS

## 2015-04-24 MED ORDER — THIAMINE HCL 100 MG/ML IJ SOLN: 100.0000 mg | Freq: Every day | INTRAMUSCULAR | Status: DC

## 2015-04-24 MED ORDER — DEXTROSE 5 % IV SOLN
1.0000 g | Freq: Once | INTRAVENOUS | Status: AC
  Administered 2015-04-24: 1 g via INTRAVENOUS
  Filled 2015-04-24: qty 10

## 2015-04-24 MED ORDER — ONDANSETRON HCL 4 MG/2ML IJ SOLN: 4.0000 mg | INTRAMUSCULAR | Status: DC | PRN

## 2015-04-24 MED ORDER — SODIUM CHLORIDE 0.9 % IV SOLN
INTRAVENOUS | Status: DC
  Administered 2015-04-24 – 2015-04-25 (×3): via INTRAVENOUS

## 2015-04-24 MED ORDER — METOCLOPRAMIDE HCL 5 MG/ML IJ SOLN
10.0000 mg | Freq: Once | INTRAMUSCULAR | Status: AC
  Administered 2015-04-24: 10 mg via INTRAVENOUS
  Filled 2015-04-24: qty 2

## 2015-04-24 MED ORDER — FOLIC ACID 1 MG PO TABS
1.0000 mg | ORAL_TABLET | Freq: Every day | ORAL | Status: DC
  Administered 2015-04-24 – 2015-04-27 (×4): 1 mg via ORAL
  Filled 2015-04-24 (×4): qty 1

## 2015-04-24 MED ORDER — ONDANSETRON HCL 4 MG/2ML IJ SOLN: 4.0000 mg | Freq: Four times a day (QID) | INTRAMUSCULAR | Status: DC | PRN

## 2015-04-24 MED ORDER — SODIUM CHLORIDE 0.9 % IV SOLN
INTRAVENOUS | Status: DC
  Administered 2015-04-24: 14:00:00 via INTRAVENOUS

## 2015-04-24 MED ORDER — GUAIFENESIN-DM 100-10 MG/5ML PO SYRP: 5.0000 mL | ORAL_SOLUTION | ORAL | Status: DC | PRN

## 2015-04-24 MED ORDER — LORAZEPAM 1 MG PO TABS
1.0000 mg | ORAL_TABLET | Freq: Four times a day (QID) | ORAL | Status: DC | PRN
Start: 2015-04-24 — End: 2015-04-27
  Administered 2015-04-25 – 2015-04-26 (×3): 1 mg via ORAL
  Filled 2015-04-24 (×3): qty 1

## 2015-04-24 MED ORDER — ACETAMINOPHEN 650 MG RE SUPP: 650.0000 mg | Freq: Four times a day (QID) | RECTAL | Status: DC | PRN

## 2015-04-24 MED ORDER — CEFTRIAXONE SODIUM IN DEXTROSE 20 MG/ML IV SOLN
1.0000 g | INTRAVENOUS | Status: DC
  Administered 2015-04-25 – 2015-04-26 (×2): 1 g via INTRAVENOUS
  Filled 2015-04-24 (×3): qty 50

## 2015-04-24 MED ORDER — VITAMIN B-1 100 MG PO TABS
100.0000 mg | ORAL_TABLET | Freq: Every day | ORAL | Status: DC
  Administered 2015-04-24 – 2015-04-27 (×4): 100 mg via ORAL
  Filled 2015-04-24 (×4): qty 1

## 2015-04-24 NOTE — ED Notes (Signed)
Pt reports having migraine yesterday with nausea and vomiting, she sts migraine has resolved however she is not able to keep anything down due to nausea and vomiting. Denies fever, diarrhea.

## 2015-04-24 NOTE — ED Notes (Addendum)
Ondasetron ODT given in triage per patient.

## 2015-04-24 NOTE — ED Notes (Signed)
Pt transported to Xray. 

## 2015-04-24 NOTE — ED Provider Notes (Signed)
CSN: [id number]     Arrival date & time 04/24/15  1021 History   First MD Initiated Contact with Patient 04/24/15 1104     Chief Complaint  Patient presents with  . Emesis      HPI  Pt was seen at 1130. Per pt, c/o gradual onset and persistence of multiple intermittent episodes of N/V that began yesterday.   States she "had a migraine headache" located in her forehead yesterday when her symptoms began. Pt endorses hx of migraine headaches, and describes the headache followed by N/V as per her usual chronic symptoms. Pt states her headache has resolved today, but the N/V continues. Denies abd pain, no CP/SOB, no back pain, no fevers, no diarrhea, no black or blood in stools or emesis.  Denies headache was sudden or maximal in onset or at any time.  Denies visual changes, no focal motor weakness, no tingling/numbness in extremities, no fevers, no neck pain, no rash.      Past Medical History  Diagnosis Date  . Allergy   . Anemia   . 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 Hypertension   . Hypothyroidism   . GERD (gastroesophageal reflux disease)   . Headache    Past Surgical History  Procedure Laterality Date  . Laparoscopic appendectomy    . Back surgery      x 2  . Colonoscopy N/A 03/13/2013    Procedure: COLONOSCOPY;  Surgeon: Leighton Ruff, MD;  Location: [facility];  Service: Endoscopy;  Laterality: N/A;   Family History  Problem Relation Age of Onset  . Stroke Father   . Stroke Brother   . Stroke Brother    History  Substance Use Topics  . Smoking status: Current Every Day Smoker -- 0.50 packs/day    Types: Cigarettes  . Smokeless tobacco: Not on file     Comment: 3 cigarettes per day  . Alcohol Use: 6.0 oz/week    10 Glasses of wine per week     Comment: occasional    Review of Systems ROS: Statement: All systems negative except as marked or noted in the HPI; Constitutional: Negative for fever and chills. ; ; Eyes: Negative for eye pain, redness and discharge. ; ; ENMT:  Negative for ear pain, hoarseness, nasal congestion, sinus pressure and sore throat. ; ; Cardiovascular: Negative for chest pain, palpitations, diaphoresis, dyspnea and peripheral edema. ; ; Respiratory: Negative for cough, wheezing and stridor. ; ; Gastrointestinal: +N/V. Negative for diarrhea, abdominal pain, blood in stool, hematemesis, jaundice and rectal bleeding. . ; ; Genitourinary: Negative for dysuria, flank pain and hematuria. ; ; Musculoskeletal: Negative for back pain and neck pain. Negative for swelling and trauma.; ; Skin: Negative for pruritus, rash, abrasions, blisters, bruising and skin lesion.; ; Neuro: +headache. Negative for lightheadedness and neck stiffness. Negative for weakness, altered level of consciousness , altered mental status, extremity weakness, paresthesias, involuntary movement, seizure and syncope.       Allergies  Codeine; Flexeril; Sulfa antibiotics; and Synthroid  Home Medications   Prior to Admission medications   Medication Sig Start Date End Date Taking? Authorizing Provider  acetaminophen (TYLENOL) 500 MG tablet Take 500 mg by mouth every 6 (six) hours as needed for mild pain, moderate pain, fever or headache.   Yes Historical Provider, MD  Cranberry 500 MG CHEW Chew 1 tablet by mouth 2 (two) times daily.   Yes Historical Provider, MD  docusate sodium (COLACE) 100 MG capsule Take 100 mg by mouth  daily.   Yes Historical Provider, MD  ferrous gluconate (FERGON) 240 (27 FE) MG tablet Take 240 mg by mouth daily.   Yes Historical Provider, MD  Multiple Vitamins-Minerals (CENTRUM SILVER ADULT 50+) TABS Take 1 tablet by mouth daily.   Yes Historical Provider, MD  omeprazole (PRILOSEC) 20 MG capsule Take 20 mg by mouth daily.   Yes Historical Provider, MD  Multiple Vitamin (MULTIVITAMIN WITH MINERALS) TABS Take 1 tablet by mouth daily. Patient not taking: Reported on 04/24/2015 01/29/13   [name], MD   BP 131/88 mmHg  Pulse 105  Temp(Src) 98.7 F (37.1  C) (Oral)  Resp 13  SpO2 100%   Filed Vitals:   04/24/15 1030 04/24/15 1220 04/24/15 1319 04/24/15 1432  BP: 144/96 131/88 130/76 135/77  Pulse: 124 105 99 96  Temp: 98.7 F (37.1 C)     TempSrc: Oral     Resp: 20 13 26 12   SpO2: 100% 100% 99% 100%      12:15 Orthostatic Vital Signs CS  Orthostatic Lying  - BP- Lying: 144/89 mmHg ; Pulse- Lying: 109  Orthostatic Sitting - BP- Sitting: 139/97 mmHg ; Pulse- Sitting: 146  Orthostatic Standing at 0 minutes - BP- Standing at 0 minutes: 131/88 mmHg ; Pulse- Standing at 0 minutes: 143      Physical Exam  1135: Physical examination:  Nursing notes reviewed; Vital signs and O2 SAT reviewed;  Constitutional: Well developed, Well nourished, In no acute distress; Head:  Normocephalic, atraumatic; Eyes: EOMI, PERRL, No scleral icterus; ENMT: TM's clear bilat. +edemetous nasal turbinates bilat with clear rhinorrhea. Mouth and pharynx without lesions. No tonsillar exudates. No intra-oral edema. No submandibular or sublingual edema. No hoarse voice, no drooling, no stridor. No pain with manipulation of larynx. No trismus. Mouth and pharynx normal, Mucous membranes dry; Neck: Supple, Full range of motion, No lymphadenopathy; Cardiovascular: Tachycardic rate and rhythm, No gallop; Respiratory: Breath sounds clear & equal bilaterally, No wheezes.  Speaking full sentences with ease, Normal respiratory effort/excursion; Chest: Nontender, Movement normal; Abdomen: Soft, Nontender, Nondistended, Normal bowel sounds; Genitourinary: No CVA tenderness; Extremities: Pulses normal, No tenderness, No edema, No calf edema or asymmetry.; Neuro: AA&Ox3, Major CN grossly intact.  Speech clear. No gross focal motor or sensory deficits in extremities.; Skin: Color normal, Warm, Dry.   ED Course  Procedures     EKG Interpretation   Date/Time:  Sunday April 24 2015 12:13:13 EDT Ventricular Rate:  109 PR Interval:  138 QRS Duration: 72 QT Interval:  343 QTC  Calculation: 462 R Axis:   -16 Text Interpretation:  Sinus tachycardia Borderline left axis deviation  Anterior infarct, old Artifact Baseline wander When compared with ECG of  01/26/2013 No significant change was found Confirmed by [facility]  MD,  Nunzio Cory [id number]) on 04/24/2015 12:36:53 PM      MDM  MDM Reviewed: previous chart, nursing note and vitals Reviewed previous: labs and ECG Interpretation: labs, ECG and x-ray      Results for orders placed or performed during the hospital encounter of 04/24/15  CBC with Differential  Result Value Ref Range   WBC 13.3 (H) 4.0 - 10.5 K/uL   RBC 3.68 (L) 3.87 - 5.11 MIL/uL   Hemoglobin 11.0 (L) 12.0 - 15.0 g/dL   HCT 36.1 36.0 - 46.0 %   MCV 98.1 78.0 - 100.0 fL   MCH 29.9 26.0 - 34.0 pg   MCHC 30.5 30.0 - 36.0 g/dL   RDW 23.0 (H) 11.5 - 15.5 %  Platelets 252 150 - 400 K/uL   Neutrophils Relative % 84 (H) 43 - 77 %   Lymphocytes Relative 5 (L) 12 - 46 %   Monocytes Relative 10 3 - 12 %   Eosinophils Relative 0 0 - 5 %   Basophils Relative 1 0 - 1 %   Neutro Abs 11.2 (H) 1.7 - 7.7 K/uL   Lymphs Abs 0.7 0.7 - 4.0 K/uL   Monocytes Absolute 1.3 (H) 0.1 - 1.0 K/uL   Eosinophils Absolute 0.0 0.0 - 0.7 K/uL   Basophils Absolute 0.1 0.0 - 0.1 K/uL   Smear Review MORPHOLOGY UNREMARKABLE   Comprehensive metabolic panel  Result Value Ref Range   Sodium 138 135 - 145 mmol/L   Potassium 3.7 3.5 - 5.1 mmol/L   Chloride 94 (L) 101 - 111 mmol/L   CO2 19 (L) 22 - 32 mmol/L   Glucose, Bld 112 (H) 65 - 99 mg/dL   BUN 9 6 - 20 mg/dL   Creatinine, Ser 0.58 0.44 - 1.00 mg/dL   Calcium 9.3 8.9 - 10.3 mg/dL   Total Protein 8.4 (H) 6.5 - 8.1 g/dL   Albumin 4.2 3.5 - 5.0 g/dL   AST 168 (H) 15 - 41 U/L   ALT 38 14 - 54 U/L   Alkaline Phosphatase 367 (H) 38 - 126 U/L   Total Bilirubin 1.1 0.3 - 1.2 mg/dL   GFR calc non Af Amer >60 >60 mL/min   GFR calc Af Amer >60 >60 mL/min   Anion gap 25 (H) 5 - 15  Lipase, blood  Result Value Ref Range    Lipase 17 (L) 22 - 51 U/L  Urinalysis, Routine w reflex microscopic  Result Value Ref Range   Color, Urine YELLOW YELLOW   APPearance CLEAR CLEAR   Specific Gravity, Urine 1.017 1.005 - 1.030   pH 6.5 5.0 - 8.0   Glucose, UA NEGATIVE NEGATIVE mg/dL   Hgb urine dipstick NEGATIVE NEGATIVE   Bilirubin Urine MODERATE (A) NEGATIVE   Ketones, ur >80 (A) NEGATIVE mg/dL   Protein, ur NEGATIVE NEGATIVE mg/dL   Urobilinogen, UA 1.0 0.0 - 1.0 mg/dL   Nitrite POSITIVE (A) NEGATIVE   Leukocytes, UA SMALL (A) NEGATIVE  Urine microscopic-add on  Result Value Ref Range   Squamous Epithelial / LPF FEW (A) RARE   WBC, UA 7-10 <3 WBC/hpf   Bacteria, UA MANY (A) RARE   Urine-Other MUCOUS PRESENT   I-stat troponin, ED  Result Value Ref Range   Troponin i, poc 0.01 0.00 - 0.08 ng/mL   Comment 3          I-Stat CG4 Lactic Acid, ED  Result Value Ref Range   Lactic Acid, Venous 0.79 0.5 - 2.0 mmol/L   US Abdomen Complete 04/24/2015   CLINICAL DATA:  Emesis, elevated LFTs, smoker, anemia, hypertension, GERD  EXAM: ULTRASOUND ABDOMEN COMPLETE  COMPARISON:  CT abdomen and pelvis 03/22/2014  FINDINGS: Gallbladder: Normally distended without stones or wall thickening.  No pericholecystic fluid or sonographic Murphy sign.  Common bile duct: Diameter: Normal caliber 2 mm diameter  Liver: Echogenic, likely fatty infiltration, though this can be seen with cirrhosis and certain infiltrative disorders. No focal hepatic mass or nodularity. Hepatopetal portal venous flow.  IVC: Normal appearance  Pancreas: Inadequately visualized due to obscuration by bowel gas.  Spleen: Normal appearance, 6.0 cm length  Right Kidney: Length: 10.3 cm. Normal morphology without mass or hydronephrosis.  Left Kidney: Length: 9.9 cm. Normal morphology without  mass or hydronephrosis.  Abdominal aorta: Normal caliber  Other findings: No free-fluid  IMPRESSION: Probable fatty infiltration of liver as discussed above.  Incomplete pancreatic  visualization.  Otherwise normal exam.   Electronically Signed   By: Lavonia Dana M.D.   On: 04/24/2015 14:48   Dg Abd Acute W/chest 04/24/2015   CLINICAL DATA:  Migraine headaches with nausea and vomiting yesterday. The headaches have resolved. Nausea and vomiting persists.  EXAM: DG ABDOMEN ACUTE W/ 1V CHEST  COMPARISON:  None.  FINDINGS: There is no evidence of dilated bowel loops or free intraperitoneal air. No radiopaque calculi or other significant radiographic abnormality is seen. Heart size and mediastinal contours are within normal limits. Both lungs are clear.  IMPRESSION: Negative abdominal radiographs.  No acute cardiopulmonary disease.   Electronically Signed   By: San Morelle M.D.   On: 04/24/2015 12:56    1505:   +UTI, UC pending; will dose IV rocephin. Despite IVF bolus, pt remains tachycardic and is unsteady while ambulating (needed 1 heavy assist). LFT elevated per baseline. Dx and testing d/w pt and family.  Questions answered.  Verb understanding, agreeable to admit. T/C to [facility] Dr. Sloan Leiter, case discussed, including:  HPI, pertinent PM/SHx, VS/PE, dx testing, ED course and treatment:  Agreeable to admit, requests to add etoh level and UDS, write temporary orders, obtain tele bed to team [facility].   Francine Graven, DO 04/28/15 1430

## 2015-04-24 NOTE — H&P (Signed)
PATIENT DETAILS Name: Holly Phelps Age: 54 y.o. Sex: female Date of Birth: [birth year] Admit Date: 04/24/2015 [id number], Veneda Melter, MD Referring Physician:Dr Thurnell Garbe   CHIEF COMPLAINT:  Vomiting-since yesterday  HPI: Holly Phelps is a 54 y.o. female with a Past Medical History of alcohol abuse, GERD, iron deficiency anemia who presents today with the above noted complaint. Patient also claims she has a history of migraine headaches and gets occasional headaches associated with nausea and vomiting-she had a headache that is very similar to her migraine headache yesterday, this was associated with some nausea and vomiting. Her headache resolved spontaneously after she slept it off, however her nausea and vomiting have been persistent since yesterday. She claims she had 3 episodes of persistent nausea with vomiting today-note vomitus was nonbloody and nonbilious. She denies any abdominal pain. She denies any diarrhea. Is no history of fever. She also denies any dysuria or frequency or hematuria. She subsequently presented to the emergency room, which was found to have mild tachycardia, leukocytosis, UA was suggestive of UTI. She was also found to have elevation on in her LFTs. I was subsequently asked to admit this patient for further evaluation and treatment.  ALLERGIES:   Allergies  Allergen Reactions  . Codeine Nausea And Vomiting  . Flexeril [Cyclobenzaprine] Itching  . Sulfa Antibiotics     Pt does not recall what the reaction was,been too long  . Synthroid [Levothyroxine] Rash    PAST MEDICAL HISTORY: Past Medical History  Diagnosis Date  . Allergy   . Anemia   . 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 Hypertension   . Hypothyroidism   . GERD (gastroesophageal reflux disease)   . Headache     PAST SURGICAL HISTORY: Past Surgical History  Procedure Laterality Date  . Laparoscopic appendectomy    . Back surgery      x 2  . Colonoscopy N/A 03/13/2013   Procedure: COLONOSCOPY;  Surgeon: Leighton Ruff, MD;  Location: [facility];  Service: Endoscopy;  Laterality: N/A;    MEDICATIONS AT HOME: Prior to Admission medications   Medication Sig Start Date End Date Taking? Authorizing Provider  acetaminophen (TYLENOL) 500 MG tablet Take 500 mg by mouth every 6 (six) hours as needed for mild pain, moderate pain, fever or headache.   Yes Historical Provider, MD  Cranberry 500 MG CHEW Chew 1 tablet by mouth 2 (two) times daily.   Yes Historical Provider, MD  docusate sodium (COLACE) 100 MG capsule Take 100 mg by mouth daily.   Yes Historical Provider, MD  ferrous gluconate (FERGON) 240 (27 FE) MG tablet Take 240 mg by mouth daily.   Yes Historical Provider, MD  Multiple Vitamins-Minerals (CENTRUM SILVER ADULT 50+) TABS Take 1 tablet by mouth daily.   Yes Historical Provider, MD  omeprazole (PRILOSEC) 20 MG capsule Take 20 mg by mouth daily.   Yes Historical Provider, MD  Multiple Vitamin (MULTIVITAMIN WITH MINERALS) TABS Take 1 tablet by mouth daily. Patient not taking: Reported on 04/24/2015 01/29/13   Louellen Molder, MD    FAMILY HISTORY: Family History  Problem Relation Age of Onset  . Stroke Father   . Stroke Brother   . Stroke Brother     SOCIAL HISTORY:  reports that she has been smoking Cigarettes.  She has been smoking about 0.50 packs per day. She does not have any smokeless tobacco history on file. She reports that she drinks about 6.0 oz of  alcohol per week. She reports that she does not use illicit drugs. Lives at: Home Mobility: Independent  REVIEW OF SYSTEMS:  Constitutional:   No  weight loss, night sweats,  Fevers, chills, fatigue.  HEENT:    No headaches, Dysphagia,Tooth/dental problems,Sore throat,  No sneezing, itching, ear ache, nasal congestion, post nasal drip  Cardio-vascular: No chest pain,Orthopnea, PND,lower extremity edema, anasarca, palpitations  GI:  No heartburn, indigestion, abdominal pain,  diarrhea,  melena or hematochezia  Resp: No shortness of breath, cough, hemoptysis,plueritic chest pain.   Skin:  No rash or lesions.  GU:  No dysuria, change in color of urine, no urgency or frequency.  No flank pain.  Musculoskeletal: No joint pain or swelling.  No decreased range of motion.  No back pain.  Endocrine: No heat intolerance, no cold intolerance, no polyuria, no polydipsia  Psych: No change in mood or affect. No depression or anxiety.  No memory loss.   PHYSICAL EXAM: Blood pressure 135/77, pulse 101, temperature 98.7 F (37.1 C), temperature source Oral, resp. rate 12, SpO2 100 %.  General appearance :Awake, alert, not in any distress. Speech Clear. Not toxic Looking HEENT: Atraumatic and Normocephalic, pupils equally reactive to light and accomodation Neck: supple, no JVD. No cervical lymphadenopathy.  Chest:Good air entry bilaterally, no added sounds  CVS: S1 S2 regular, no murmurs.  Abdomen: Bowel sounds present, Non tender and not distended with no gaurding, rigidity or rebound. Extremities: B/L Lower Ext shows no edema, both legs are warm to touch Neurology:  Non focal Skin:No Rash Wounds:N/A  LABS ON ADMISSION:   Recent Labs  04/24/15 1120  NA 138  K 3.7  CL 94*  CO2 19*  GLUCOSE 112*  BUN 9  CREATININE 0.58  CALCIUM 9.3    Recent Labs  04/24/15 1120  AST 168*  ALT 38  ALKPHOS 367*  BILITOT 1.1  PROT 8.4*  ALBUMIN 4.2    Recent Labs  04/24/15 1120  LIPASE 17*    Recent Labs  04/24/15 1120  WBC 13.3*  NEUTROABS 11.2*  HGB 11.0*  HCT 36.1  MCV 98.1  PLT 252   No results for input(s): CKTOTAL, CKMB, CKMBINDEX, TROPONINI in the last 72 hours. No results for input(s): DDIMER in the last 72 hours. Invalid input(s): POCBNP   RADIOLOGIC STUDIES ON ADMISSION: US Abdomen Complete  04/24/2015   CLINICAL DATA:  Emesis, elevated LFTs, smoker, anemia, hypertension, GERD  EXAM: ULTRASOUND ABDOMEN COMPLETE  COMPARISON:  CT abdomen and  pelvis 03/22/2014  FINDINGS: Gallbladder: Normally distended without stones or wall thickening.  No pericholecystic fluid or sonographic Murphy sign.  Common bile duct: Diameter: Normal caliber 2 mm diameter  Liver: Echogenic, likely fatty infiltration, though this can be seen with cirrhosis and certain infiltrative disorders. No focal hepatic mass or nodularity. Hepatopetal portal venous flow.  IVC: Normal appearance  Pancreas: Inadequately visualized due to obscuration by bowel gas.  Spleen: Normal appearance, 6.0 cm length  Right Kidney: Length: 10.3 cm. Normal morphology without mass or hydronephrosis.  Left Kidney: Length: 9.9 cm. Normal morphology without mass or hydronephrosis.  Abdominal aorta: Normal caliber  Other findings: No free-fluid  IMPRESSION: Probable fatty infiltration of liver as discussed above.  Incomplete pancreatic visualization.  Otherwise normal exam.   Electronically Signed   By: Lavonia Dana M.D.   On: 04/24/2015 14:48   Dg Abd Acute W/chest  04/24/2015   CLINICAL DATA:  Migraine headaches with nausea and vomiting yesterday. The headaches have resolved. Nausea and  vomiting persists.  EXAM: DG ABDOMEN ACUTE W/ 1V CHEST  COMPARISON:  None.  FINDINGS: There is no evidence of dilated bowel loops or free intraperitoneal air. No radiopaque calculi or other significant radiographic abnormality is seen. Heart size and mediastinal contours are within normal limits. Both lungs are clear.  IMPRESSION: Negative abdominal radiographs.  No acute cardiopulmonary disease.   Electronically Signed   By: San Morelle M.D.   On: 04/24/2015 12:56    I have personally reviewed images of chest/Abd xray    EKG: Personally reviewed. Sinus tachy  ASSESSMENT AND PLAN: Present on Admission:  . UTI (urinary tract infection): Although no dysuria/frequency-has leukocytosis and a somewhat positive UA. Will empirically continue on Rocephin. Await cultures.   . Intractable vomiting with nausea: Not  sure if this is related to above or related to a migraine headache yesterday. We will give 1 dose of Reglan with the Benadryl to see if any resolution in for now. For now clear Liquids, IV fluids, as needed Zofran and follow. Abdomen is soft without any distention, abdominal x-ray negative for acute abnormalities.   . Alcohol abuse: Daily alcohol use-claims she only drinks approximately 2 glasses of beer or bourbon daily basis. Last drink was this past Friday. Start Ativan per protocol. Monitor.   Marland Kitchen  History of Esophagitis: Continue with PPI   . Dehydration: Continue IV fluids. Follow and reassess volume status tomorrow.  Further course will depend as patient's clinical course evolves and further radiologic and laboratory data become available. Patient will be monitored closely.   Above noted plan was discussed with patient/spouse-face to face at bedside, they were in agreement.   CONSULTS: None  DVT Prophylaxis: Prophylactic Lovenox  Code Status: Full Code  Disposition Plan:  Discharge back home possibly in 1-2 days,but may warrant SNF depending on clinical course   Total time spent  55 minutes.Greater than 50% of this time was spent in counseling, explanation of diagnosis, planning of further management, and coordination of care.  [facility] Pager [phone]  If 7PM-7AM, please contact night-coverage [email or web address] Password [email or web address] 04/24/2015, 3:55 PM

## 2015-04-24 NOTE — ED Notes (Signed)
Bed: WA19 Expected date:  Expected time:  Means of arrival:  Comments: 

## 2015-04-25 DIAGNOSIS — E860 Dehydration: Secondary | ICD-10-CM

## 2015-04-25 LAB — COMPREHENSIVE METABOLIC PANEL
ALT: 27 U/L (ref 14–54)
AST: 150 U/L — ABNORMAL HIGH (ref 15–41)
Albumin: 3.2 g/dL — ABNORMAL LOW (ref 3.5–5.0)
Alkaline Phosphatase: 271 U/L — ABNORMAL HIGH (ref 38–126)
Anion gap: 16 — ABNORMAL HIGH (ref 5–15)
BUN: <5 mg/dL — ABNORMAL LOW (ref 6–20)
CO2: 20 mmol/L — ABNORMAL LOW (ref 22–32)
Calcium: 7.9 mg/dL — ABNORMAL LOW (ref 8.9–10.3)
Chloride: 101 mmol/L (ref 101–111)
Creatinine, Ser: 0.37 mg/dL — ABNORMAL LOW (ref 0.44–1.00)
GFR calc Af Amer: >60 mL/min (ref >60)
GFR calc non Af Amer: >60 mL/min (ref >60)
Glucose, Bld: 78 mg/dL (ref 65–99)
Potassium: 2.8 mmol/L — ABNORMAL LOW (ref 3.5–5.1)
Sodium: 137 mmol/L (ref 135–145)
Total Bilirubin: 0.7 mg/dL (ref 0.3–1.2)
Total Protein: 6.6 g/dL (ref 6.5–8.1)

## 2015-04-25 LAB — TROPONIN I
Troponin I: <0.03 ng/mL (ref <0.031)
Troponin I: <0.03 ng/mL (ref <0.031)

## 2015-04-25 LAB — PROTIME-INR
INR: 1.14 (ref 0.00–1.49)
Prothrombin Time: 14.8 seconds (ref 11.6–15.2)

## 2015-04-25 LAB — CBC
HCT: 28.4 % — ABNORMAL LOW (ref 36.0–46.0)
Hemoglobin: 8.6 g/dL — ABNORMAL LOW (ref 12.0–15.0)
MCH: 29.3 pg (ref 26.0–34.0)
MCHC: 30.3 g/dL (ref 30.0–36.0)
MCV: 96.6 fL (ref 78.0–100.0)
Platelets: 173 10*3/uL (ref 150–400)
RBC: 2.94 MIL/uL — ABNORMAL LOW (ref 3.87–5.11)
RDW: 22.6 % — ABNORMAL HIGH (ref 11.5–15.5)
WBC: 5.9 10*3/uL (ref 4.0–10.5)

## 2015-04-25 MED ORDER — POTASSIUM CHLORIDE 10 MEQ/100ML IV SOLN
10.0000 meq | INTRAVENOUS | Status: AC
  Administered 2015-04-25 (×4): 10 meq via INTRAVENOUS
  Filled 2015-04-25 (×4): qty 100

## 2015-04-25 NOTE — Progress Notes (Signed)
Patient's heart rate up to 140's while ambulating in room to the bathroom.  Patient unsteady/ shaky while on feet.  Will continue to monitor and assist with help out of bed.  Owens Shark, Nakisha Chai Cherie

## 2015-04-25 NOTE — Progress Notes (Signed)
Ambulated patient in the hall with minimal difficulty. She ambulated by herself without any assistance.  Was slightly weak initially once OOB but tolerated ambulation well.  Up to chair after walk.  Will encourage continued ambulation.

## 2015-04-25 NOTE — Progress Notes (Signed)
PATIENT DETAILS Name: Holly Phelps Age: 54 y.o. Sex: female Date of Birth: Feb 21, 1961 Admit Date: 04/24/2015 Admitting Physician Evalee Mutton Kristeen Mans, MD [id number], Veneda Melter, MD  Subjective: Much improved-no further vomiting or diarrhea.  Assessment/Plan: Principal Problem: Intractable vomiting with nausea:? 2. Resolving with supportive care. Advance diet as tolerated. Abdomen is soft nontender. We'll continue to follow, continue with supportive care  Active Problems: Suspected UTI: Continue Rocephin, await urine cultures. Suspect needs treatment for only 3 days.  Hypokalemia: Secondary to GI loss. Replete and recheck.  Alcohol abuse: Awake and alert. No signs of alcohol withdrawal at this present time. Continue on Ativan protocol  Dehydration: Resolved with IV fluids. Saline lock all IV fluids-diet being advanced  History of Esophagitis: Continue with PPI   History of migraine headaches: Currently with no headaches  Disposition: Remain inpatient  Antimicrobial agents  See below  Anti-infectives    Start     Dose/Rate Route Frequency Ordered Stop   04/25/15 1600  cefTRIAXone (ROCEPHIN) 1 g in dextrose 5 % 50 mL IVPB - Premix     1 g 100 mL/hr over 30 Minutes Intravenous Every 24 hours 04/24/15 1620     04/24/15 1500  cefTRIAXone (ROCEPHIN) 1 g in dextrose 5 % 50 mL IVPB     1 g 100 mL/hr over 30 Minutes Intravenous  Once 04/24/15 1455 04/24/15 1540      DVT Prophylaxis: Prophylactic Lovenox   Code Status: Full code   Family Communication None at bedside  Procedures: None  CONSULTS:  None  Time spent 25 minutes-Greater than 50% of this time was spent in counseling, explanation of diagnosis, planning of further management, and coordination of care.  MEDICATIONS: Scheduled Meds: . cefTRIAXone (ROCEPHIN)  IV  1 g Intravenous Q24H  . enoxaparin (LOVENOX) injection  40 mg Subcutaneous Q24H  . ferrous gluconate  324 mg Oral Daily  .  folic acid  1 mg Oral Daily  . multivitamin with minerals  1 tablet Oral Daily  . pantoprazole (PROTONIX) IV  40 mg Intravenous Daily  . thiamine  100 mg Oral Daily   Or  . thiamine  100 mg Intravenous Daily   Continuous Infusions:  PRN Meds:.acetaminophen **OR** acetaminophen, albuterol, guaiFENesin-dextromethorphan, LORazepam **OR** LORazepam, ondansetron **OR** ondansetron (ZOFRAN) IV    PHYSICAL EXAM: Vital signs in last 24 hours: Filed Vitals:   04/24/15 1615 04/24/15 2040 04/25/15 0534 04/25/15 1251  BP: 145/91 123/78 129/81 125/72  Pulse: 109 102 99 103  Temp: 99.2 F (37.3 C) 98.7 F (37.1 C) 98.4 F (36.9 C) 98.3 F (36.8 C)  TempSrc: Oral Oral Oral Oral  Resp:  20 16 16   Height: 5\' 2"  (1.575 m)     Weight: 48.9 kg (107 lb 12.9 oz)     SpO2: 100% 100% 99% 100%    Weight change:  Filed Weights   04/24/15 1615  Weight: 48.9 kg (107 lb 12.9 oz)   Body mass index is 19.71 kg/(m^2).   Gen Exam: Awake and alert with clear speech.  Neck: Supple, No JVD.   Chest: B/L Clear.   CVS: S1 S2 Regular, no murmurs.  Abdomen: soft, BS +, non tender, non distended.  Extremities: no edema, lower extremities warm to touch. Neurologic: Non Focal.  Skin: No Rash.   Wounds: N/A.    Intake/Output from previous day:  Intake/Output Summary (Last 24 hours) at 04/25/15 1506 Last data filed  at 04/25/15 1342  Gross per 24 hour  Intake 2261.67 ml  Output      0 ml  Net 2261.67 ml     LAB RESULTS: CBC  Recent Labs Lab 04/24/15 1120 04/25/15 0439  WBC 13.3* 5.9  HGB 11.0* 8.6*  HCT 36.1 28.4*  PLT 252 173  MCV 98.1 96.6  MCH 29.9 29.3  MCHC 30.5 30.3  RDW 23.0* 22.6*  LYMPHSABS 0.7  --   MONOABS 1.3*  --   EOSABS 0.0  --   BASOSABS 0.1  --     Chemistries   Recent Labs Lab 04/24/15 1120 04/25/15 0439  NA 138 137  K 3.7 2.8*  CL 94* 101  CO2 19* 20*  GLUCOSE 112* 78  BUN 9 <5*  CREATININE 0.58 0.37*  CALCIUM 9.3 7.9*    CBG: No results for  input(s): GLUCAP in the last 168 hours.  GFR Estimated Creatinine Clearance: 62.1 mL/min (by C-G formula based on Cr of 0.37).  Coagulation profile  Recent Labs Lab 04/25/15 0439  INR 1.14    Cardiac Enzymes  Recent Labs Lab 04/24/15 1620 04/24/15 2304 04/25/15 0439  TROPONINI <0.03 <0.03 <0.03    Invalid input(s): POCBNP No results for input(s): DDIMER in the last 72 hours. No results for input(s): HGBA1C in the last 72 hours. No results for input(s): CHOL, HDL, LDLCALC, TRIG, CHOLHDL, LDLDIRECT in the last 72 hours. No results for input(s): TSH, T4TOTAL, T3FREE, THYROIDAB in the last 72 hours.  Invalid input(s): FREET3 No results for input(s): VITAMINB12, FOLATE, FERRITIN, TIBC, IRON, RETICCTPCT in the last 72 hours.  Recent Labs  04/24/15 1120  LIPASE 17*    Urine Studies No results for input(s): UHGB, CRYS in the last 72 hours.  Invalid input(s): UACOL, UAPR, USPG, UPH, UTP, UGL, UKET, UBIL, UNIT, UROB, ULEU, UEPI, UWBC, URBC, UBAC, CAST, UCOM, BILUA  MICROBIOLOGY: No results found for this or any previous visit (from the past 240 hour(s)).  RADIOLOGY STUDIES/RESULTS: US Abdomen Complete  04/24/2015   CLINICAL DATA:  Emesis, elevated LFTs, smoker, anemia, hypertension, GERD  EXAM: ULTRASOUND ABDOMEN COMPLETE  COMPARISON:  CT abdomen and pelvis 03/22/2014  FINDINGS: Gallbladder: Normally distended without stones or wall thickening.  No pericholecystic fluid or sonographic Murphy sign.  Common bile duct: Diameter: Normal caliber 2 mm diameter  Liver: Echogenic, likely fatty infiltration, though this can be seen with cirrhosis and certain infiltrative disorders. No focal hepatic mass or nodularity. Hepatopetal portal venous flow.  IVC: Normal appearance  Pancreas: Inadequately visualized due to obscuration by bowel gas.  Spleen: Normal appearance, 6.0 cm length  Right Kidney: Length: 10.3 cm. Normal morphology without mass or hydronephrosis.  Left Kidney: Length: 9.9  cm. Normal morphology without mass or hydronephrosis.  Abdominal aorta: Normal caliber  Other findings: No free-fluid  IMPRESSION: Probable fatty infiltration of liver as discussed above.  Incomplete pancreatic visualization.  Otherwise normal exam.   Electronically Signed   By: Lavonia Dana M.D.   On: 04/24/2015 14:48   Dg Abd Acute W/chest  04/24/2015   CLINICAL DATA:  Migraine headaches with nausea and vomiting yesterday. The headaches have resolved. Nausea and vomiting persists.  EXAM: DG ABDOMEN ACUTE W/ 1V CHEST  COMPARISON:  None.  FINDINGS: There is no evidence of dilated bowel loops or free intraperitoneal air. No radiopaque calculi or other significant radiographic abnormality is seen. Heart size and mediastinal contours are within normal limits. Both lungs are clear.  IMPRESSION: Negative abdominal radiographs.  No  acute cardiopulmonary disease.   Electronically Signed   By: San Morelle M.D.   On: 04/24/2015 12:56    Oren Binet, MD  [facility] Pager:[phone]  If 7PM-7AM, please contact night-coverage [email or web address] Password [email or web address] 04/25/2015, 3:06 PM   LOS: 1 day

## 2015-04-26 DIAGNOSIS — E876 Hypokalemia: Secondary | ICD-10-CM

## 2015-04-26 LAB — BASIC METABOLIC PANEL
Anion gap: 16 — ABNORMAL HIGH (ref 5–15)
BUN: <5 mg/dL — ABNORMAL LOW (ref 6–20)
CO2: 23 mmol/L (ref 22–32)
Calcium: 8.7 mg/dL — ABNORMAL LOW (ref 8.9–10.3)
Chloride: 99 mmol/L — ABNORMAL LOW (ref 101–111)
Creatinine, Ser: 0.41 mg/dL — ABNORMAL LOW (ref 0.44–1.00)
GFR calc Af Amer: >60 mL/min (ref >60)
GFR calc non Af Amer: >60 mL/min (ref >60)
Glucose, Bld: 79 mg/dL (ref 65–99)
Potassium: 2.7 mmol/L — CL (ref 3.5–5.1)
Sodium: 138 mmol/L (ref 135–145)

## 2015-04-26 LAB — URINE CULTURE: Colony Count: >=100000

## 2015-04-26 LAB — MAGNESIUM: Magnesium: 1.1 mg/dL — ABNORMAL LOW (ref 1.7–2.4)

## 2015-04-26 MED ORDER — POTASSIUM CHLORIDE 10 MEQ/100ML IV SOLN
10.0000 meq | INTRAVENOUS | Status: AC
  Administered 2015-04-26 (×5): 10 meq via INTRAVENOUS
  Filled 2015-04-26 (×5): qty 100

## 2015-04-26 MED ORDER — MAGNESIUM SULFATE 2 GM/50ML IV SOLN
2.0000 g | Freq: Once | INTRAVENOUS | Status: AC
  Administered 2015-04-26: 2 g via INTRAVENOUS
  Filled 2015-04-26: qty 50

## 2015-04-26 NOTE — Progress Notes (Signed)
CRITICAL VALUE ALERT  Critical value received:  K+ 2.7  Date of notification: 04/26/15  Time of notification:  04:58  Critical value read back:Yes.    Nurse who received alert:  Ruben Im, RN  MD notified (1st page):   K Schoor  Time of first page:  0630  MD notified (2nd page):  Time of second page:  Responding MD:  Jennet Maduro  Time MD responded:  [phone]

## 2015-04-26 NOTE — Progress Notes (Signed)
PATIENT DETAILS Name: Holly Phelps Age: 54 y.o. Sex: female Date of Birth: 09-13-1961 Admit Date: 04/24/2015 Admitting Physician Evalee Mutton Kristeen Mans, MD [id number], Veneda Melter, MD  Subjective: Much improved-no further vomiting or diarrhea.  Assessment/Plan: Principal Problem: Intractable vomiting with nausea:? Etiology. Resolved with supportive care. Diet advanced to regular. Abdomen is soft nontender. Continue with supportive care  Active Problems: Suspected UTI: Continue Rocephin, await urine cultures. Suspect needs treatment for only 3 days (2/3 day)  Hypokalemia: Secondary to GI loss , alcohol use,hypomagnesemia. Replete and recheck.  Hypomagnesemia: Secondary to alcohol use. Replete and recheck  Alcohol abuse: Awake and alert. No signs of alcohol withdrawal at this present time. Continue on Ativan protocol  Dehydration: Resolved with IV fluids. Diet advanced to regular.   History of Esophagitis: Continue with PPI   History of migraine headaches: Currently with no headaches  Disposition: Remain inpatient-suspect home on 6/8 if magnesium and potassium are normal.  Antimicrobial agents  See below  Anti-infectives    Start     Dose/Rate Route Frequency Ordered Stop   04/25/15 1600  cefTRIAXone (ROCEPHIN) 1 g in dextrose 5 % 50 mL IVPB - Premix     1 g 100 mL/hr over 30 Minutes Intravenous Every 24 hours 04/24/15 1620     04/24/15 1500  cefTRIAXone (ROCEPHIN) 1 g in dextrose 5 % 50 mL IVPB     1 g 100 mL/hr over 30 Minutes Intravenous  Once 04/24/15 1455 04/24/15 1540      DVT Prophylaxis: Prophylactic Lovenox   Code Status: Full code   Family Communication None at bedside  Procedures: None  CONSULTS:  None  Time spent 25 minutes-Greater than 50% of this time was spent in counseling, explanation of diagnosis, planning of further management, and coordination of care.  MEDICATIONS: Scheduled Meds: . cefTRIAXone (ROCEPHIN)  IV  1 g  Intravenous Q24H  . enoxaparin (LOVENOX) injection  40 mg Subcutaneous Q24H  . ferrous gluconate  324 mg Oral Daily  . folic acid  1 mg Oral Daily  . multivitamin with minerals  1 tablet Oral Daily  . pantoprazole (PROTONIX) IV  40 mg Intravenous Daily  . thiamine  100 mg Oral Daily   Or  . thiamine  100 mg Intravenous Daily   Continuous Infusions:  PRN Meds:.acetaminophen **OR** acetaminophen, albuterol, guaiFENesin-dextromethorphan, LORazepam **OR** LORazepam, ondansetron **OR** ondansetron (ZOFRAN) IV    PHYSICAL EXAM: Vital signs in last 24 hours: Filed Vitals:   04/25/15 2147 04/26/15 0522 04/26/15 1140 04/26/15 1202  BP: 145/86 130/96 130/87   Pulse: 94 101 106 140  Temp: 98.4 F (36.9 C) 98.3 F (36.8 C) 98.4 F (36.9 C)   TempSrc: Oral Oral Oral   Resp: 18 18 18    Height:      Weight:      SpO2: 100% 100% 100%     Weight change:  Filed Weights   04/24/15 1615  Weight: 48.9 kg (107 lb 12.9 oz)   Body mass index is 19.71 kg/(m^2).   Gen Exam: Awake and alert with clear speech.  Neck: Supple, No JVD.   Chest: B/L Clear.  No rales CVS: S1 S2 Regular, no murmurs.  Abdomen: soft, BS +, non tender, non distended.  Extremities: no edema, lower extremities warm to touch. Neurologic: Non Focal.  Skin: No Rash.   Wounds: N/A.    Intake/Output from previous day:  Intake/Output Summary (Last 24  hours) at 04/26/15 1542 Last data filed at 04/26/15 1031  Gross per 24 hour  Intake    590 ml  Output      0 ml  Net    590 ml     LAB RESULTS: CBC  Recent Labs Lab 04/24/15 1120 04/25/15 0439  WBC 13.3* 5.9  HGB 11.0* 8.6*  HCT 36.1 28.4*  PLT 252 173  MCV 98.1 96.6  MCH 29.9 29.3  MCHC 30.5 30.3  RDW 23.0* 22.6*  LYMPHSABS 0.7  --   MONOABS 1.3*  --   EOSABS 0.0  --   BASOSABS 0.1  --     Chemistries   Recent Labs Lab 04/24/15 1120 04/25/15 0439 04/26/15 0445  NA 138 137 138  K 3.7 2.8* 2.7*  CL 94* 101 99*  CO2 19* 20* 23  GLUCOSE 112*  78 79  BUN 9 <5* <5*  CREATININE 0.58 0.37* 0.41*  CALCIUM 9.3 7.9* 8.7*  MG  --   --  1.1*    CBG: No results for input(s): GLUCAP in the last 168 hours.  GFR Estimated Creatinine Clearance: 62.1 mL/min (by C-G formula based on Cr of 0.41).  Coagulation profile  Recent Labs Lab 04/25/15 0439  INR 1.14    Cardiac Enzymes  Recent Labs Lab 04/24/15 1620 04/24/15 2304 04/25/15 0439  TROPONINI <0.03 <0.03 <0.03    Invalid input(s): POCBNP No results for input(s): DDIMER in the last 72 hours. No results for input(s): HGBA1C in the last 72 hours. No results for input(s): CHOL, HDL, LDLCALC, TRIG, CHOLHDL, LDLDIRECT in the last 72 hours. No results for input(s): TSH, T4TOTAL, T3FREE, THYROIDAB in the last 72 hours.  Invalid input(s): FREET3 No results for input(s): VITAMINB12, FOLATE, FERRITIN, TIBC, IRON, RETICCTPCT in the last 72 hours.  Recent Labs  04/24/15 1120  LIPASE 17*    Urine Studies No results for input(s): UHGB, CRYS in the last 72 hours.  Invalid input(s): UACOL, UAPR, USPG, UPH, UTP, UGL, UKET, UBIL, UNIT, UROB, ULEU, UEPI, UWBC, URBC, UBAC, CAST, UCOM, BILUA  MICROBIOLOGY: Recent Results (from the past 240 hour(s))  Urine culture     Status: None (Preliminary result)   Collection Time: 04/24/15  1:00 PM  Result Value Ref Range Status   Specimen Description URINE, CATHETERIZED  Final   Special Requests NONE  Final   Colony Count   Final    >=100,000 COLONIES/ML Performed at [place] Performed at [facility]    Report Status PENDING  Incomplete    RADIOLOGY STUDIES/RESULTS: US Abdomen Complete  04/24/2015   CLINICAL DATA:  Emesis, elevated LFTs, smoker, anemia, hypertension, GERD  EXAM: ULTRASOUND ABDOMEN COMPLETE  COMPARISON:  CT abdomen and pelvis 03/22/2014  FINDINGS: Gallbladder: Normally distended without stones or wall thickening.  No pericholecystic fluid or sonographic  Murphy sign.  Common bile duct: Diameter: Normal caliber 2 mm diameter  Liver: Echogenic, likely fatty infiltration, though this can be seen with cirrhosis and certain infiltrative disorders. No focal hepatic mass or nodularity. Hepatopetal portal venous flow.  IVC: Normal appearance  Pancreas: Inadequately visualized due to obscuration by bowel gas.  Spleen: Normal appearance, 6.0 cm length  Right Kidney: Length: 10.3 cm. Normal morphology without mass or hydronephrosis.  Left Kidney: Length: 9.9 cm. Normal morphology without mass or hydronephrosis.  Abdominal aorta: Normal caliber  Other findings: No free-fluid  IMPRESSION: Probable fatty infiltration of liver as discussed  above.  Incomplete pancreatic visualization.  Otherwise normal exam.   Electronically Signed   By: Lavonia Dana M.D.   On: 04/24/2015 14:48   Dg Abd Acute W/chest  04/24/2015   CLINICAL DATA:  Migraine headaches with nausea and vomiting yesterday. The headaches have resolved. Nausea and vomiting persists.  EXAM: DG ABDOMEN ACUTE W/ 1V CHEST  COMPARISON:  None.  FINDINGS: There is no evidence of dilated bowel loops or free intraperitoneal air. No radiopaque calculi or other significant radiographic abnormality is seen. Heart size and mediastinal contours are within normal limits. Both lungs are clear.  IMPRESSION: Negative abdominal radiographs.  No acute cardiopulmonary disease.   Electronically Signed   By: San Morelle M.D.   On: 04/24/2015 12:56    Oren Binet, MD  [facility] Pager:[phone]  If 7PM-7AM, please contact night-coverage [email or web address] Password [email or web address] 04/26/2015, 3:42 PM   LOS: 2 days

## 2015-04-26 NOTE — Evaluation (Signed)
Physical Therapy Evaluation Patient Details Name: Holly Phelps MRN: [id number] DOB: 11-Mar-1961 Today's Date: 04/26/2015   History of Present Illness  54 y.o. female with h/o alcoholism admitted with nausea, vomiting. Dx of UTI, elevated LFT, hypokalemia.   Clinical Impression  Pt is independent with mobility. She ambulated 170' without an assistive device, no loss of balance. Her HR was 140 with ambulation, pt asymptomatic. Noted her potassium was 2.7 this morning and that she's had 3 runs of potassium prior to PT. No further acute PT needs, will sign off. Encourage pt to walk in halls 2-3x/day.    Follow Up Recommendations No PT follow up    Equipment Recommendations  None recommended by PT    Recommendations for Other Services       Precautions / Restrictions Precautions Precaution Comments: monitor HR Restrictions Weight Bearing Restrictions: No      Mobility  Bed Mobility               General bed mobility comments: NT-up walking in room  Transfers Overall transfer level: Independent                  Ambulation/Gait Ambulation/Gait assistance: Independent Ambulation Distance (Feet): 170 Feet Assistive device: None Gait Pattern/deviations: WFL(Within Functional Limits)   Gait velocity interpretation: at or above normal speed for age/gender General Gait Details: no LOB  Stairs            Wheelchair Mobility    Modified Rankin (Stroke Patients Only)       Balance Overall balance assessment: Independent                                           Pertinent Vitals/Pain Pain Assessment: No/denies pain    Home Living Family/patient expects to be discharged to:: Private residence Living Arrangements: Spouse/significant other Available Help at Discharge: Family;Available 24 hours/day Type of Home: House Home Access: Stairs to enter   [employer] of Steps: 2 Home Layout: One level Home Equipment: Cane -  single point      Prior Function Level of Independence: Independent               Hand Dominance        Extremity/Trunk Assessment   Upper Extremity Assessment: Overall WFL for tasks assessed           Lower Extremity Assessment: Overall WFL for tasks assessed      Cervical / Trunk Assessment: Normal  Communication   Communication: No difficulties  Cognition Arousal/Alertness: Awake/alert Behavior During Therapy: WFL for tasks assessed/performed Overall Cognitive Status: Within Functional Limits for tasks assessed                      General Comments      Exercises        Assessment/Plan    PT Assessment Patent does not need any further PT services  PT Diagnosis     PT Problem List    PT Treatment Interventions     PT Goals (Current goals can be found in the Care Plan section) Acute Rehab PT Goals Patient Stated Goal: to get home to her cat PT Goal Formulation: All assessment and education complete, DC therapy    Frequency     Barriers to discharge        Co-evaluation  End of Session Equipment Utilized During Treatment: Gait belt Activity Tolerance: Patient tolerated treatment well Patient left: in chair;with call bell/phone within reach Nurse Communication: Mobility status         Time: 2574-9355 PT Time Calculation (min) (ACUTE ONLY): 13 min   Charges:   PT Evaluation $Initial PT Evaluation Tier I: 1 Procedure     PT G CodesBlondell Phelps Phelps 04/26/2015, 12:06 PM [phone]

## 2015-04-27 DIAGNOSIS — N3000 Acute cystitis without hematuria: Secondary | ICD-10-CM

## 2015-04-27 DIAGNOSIS — F1010 Alcohol abuse, uncomplicated: Secondary | ICD-10-CM

## 2015-04-27 DIAGNOSIS — G43A1 Cyclical vomiting, intractable: Secondary | ICD-10-CM

## 2015-04-27 LAB — BASIC METABOLIC PANEL
Anion gap: 12 (ref 5–15)
BUN: <5 mg/dL — ABNORMAL LOW (ref 6–20)
CO2: 24 mmol/L (ref 22–32)
Calcium: 9 mg/dL (ref 8.9–10.3)
Chloride: 99 mmol/L — ABNORMAL LOW (ref 101–111)
Creatinine, Ser: 0.36 mg/dL — ABNORMAL LOW (ref 0.44–1.00)
GFR calc Af Amer: >60 mL/min (ref >60)
GFR calc non Af Amer: >60 mL/min (ref >60)
Glucose, Bld: 99 mg/dL (ref 65–99)
Potassium: 3.4 mmol/L — ABNORMAL LOW (ref 3.5–5.1)
Sodium: 135 mmol/L (ref 135–145)

## 2015-04-27 LAB — MAGNESIUM: Magnesium: 1.4 mg/dL — ABNORMAL LOW (ref 1.7–2.4)

## 2015-04-27 MED ORDER — MAGNESIUM LACTATE 84 MG (7MEQ) PO TBCR
168.0000 mg | EXTENDED_RELEASE_TABLET | Freq: Every day | ORAL | Status: DC
  Filled 2015-04-27: qty 2

## 2015-04-27 MED ORDER — FOLIC ACID 1 MG PO TABS: 1.0000 mg | ORAL_TABLET | Freq: Every day | ORAL | Status: DC

## 2015-04-27 MED ORDER — CIPROFLOXACIN HCL 500 MG PO TABS: 500.0000 mg | ORAL_TABLET | Freq: Two times a day (BID) | ORAL | Status: DC

## 2015-04-27 MED ORDER — POTASSIUM CHLORIDE CRYS ER 20 MEQ PO TBCR
40.0000 meq | EXTENDED_RELEASE_TABLET | Freq: Once | ORAL | Status: AC
  Administered 2015-04-27: 40 meq via ORAL
  Filled 2015-04-27: qty 2

## 2015-04-27 MED ORDER — THIAMINE HCL 100 MG PO TABS: 100.0000 mg | ORAL_TABLET | Freq: Every day | ORAL | Status: DC

## 2015-04-27 NOTE — Discharge Summary (Signed)
Physician Discharge Summary  Holly Phelps OXB:[id number] DOB: 08/01/1961 DOA: 04/24/2015  PCP: Kennon Portela, MD  Admit date: 04/24/2015 Discharge date: 04/27/2015  Time spent: 35 minutes  Recommendations for Outpatient Follow-up:  1. BMP 1 week  Discharge Diagnoses:  Principal Problem:   Intractable vomiting with nausea Active Problems:   Alcohol abuse   Dehydration   Esophagitis   UTI (urinary tract infection)   Discharge Condition: improved  Diet recommendation:  regular  Filed Weights   04/24/15 1615  Weight: 48.9 kg (107 lb 12.9 oz)    History of present illness:  Holly Phelps is a 54 y.o. female with a Past Medical History of alcohol abuse, GERD, iron deficiency anemia who presents today with the above noted complaint. Patient also claims she has a history of migraine headaches and gets occasional headaches associated with nausea and vomiting-she had a headache that is very similar to her migraine headache yesterday, this was associated with some nausea and vomiting. Her headache resolved spontaneously after she slept it off, however her nausea and vomiting have been persistent since yesterday. She claims she had 3 episodes of persistent nausea with vomiting today-note vomitus was nonbloody and nonbilious. She denies any abdominal pain. She denies any diarrhea. Is no history of fever. She also denies any dysuria or frequency or hematuria. She subsequently presented to the emergency room, which was found to have mild tachycardia, leukocytosis, UA was suggestive of UTI. She was also found to have elevation on in her LFTs. I was subsequently asked to admit this patient for further evaluation and treatment.  Hospital Course:  Intractable vomiting with nausea:? Etiology. Resolved with supportive care. Diet advanced to regular. Abdomen is soft nontender. Continue with supportive care  Suspected UTI: treat with PO cipro to finish course  Hypokalemia: Secondary to GI loss  , alcohol use,hypomagnesemia. Replete and recheck outpatient  Hypomagnesemia: Secondary to alcohol use. Replete and recheck  Alcohol abuse: Awake and alert. No signs of alcohol withdrawal at this present time. Continue on Ativan protocol  Dehydration: Resolved with IV fluids. Diet advanced to regular.   History of Esophagitis: Continue with PPI   History of migraine headaches: Currently with no headaches   Procedures:    Consultations:    Discharge Exam: Filed Vitals:   04/27/15 0500  BP: 128/87  Pulse: 88  Temp: 98.5 F (36.9 C)  Resp: 18    General: anxious to go home   Discharge Instructions   Discharge Instructions    Diet general    Complete by:  As directed      Discharge instructions    Complete by:  As directed   Avoid alcohol BMP/Mg 1 week     Increase activity slowly    Complete by:  As directed           Current Discharge Medication List    START taking these medications   Details  folic acid (FOLVITE) 1 MG tablet Take 1 tablet (1 mg total) by mouth daily. Qty: 30 tablet, Refills: 0    thiamine 100 MG tablet Take 1 tablet (100 mg total) by mouth daily. Qty: 30 tablet, Refills: 0      CONTINUE these medications which have NOT CHANGED   Details  acetaminophen (TYLENOL) 500 MG tablet Take 500 mg by mouth every 6 (six) hours as needed for mild pain, moderate pain, fever or headache.    Cranberry 500 MG CHEW Chew 1 tablet by mouth 2 (two) times daily.  docusate sodium (COLACE) 100 MG capsule Take 100 mg by mouth daily.    ferrous gluconate (FERGON) 240 (27 FE) MG tablet Take 240 mg by mouth daily.    omeprazole (PRILOSEC) 20 MG capsule Take 20 mg by mouth daily.    Multiple Vitamin (MULTIVITAMIN WITH MINERALS) TABS Take 1 tablet by mouth daily. Qty: 30 tablet, Refills: 0       Allergies  Allergen Reactions  . Codeine Nausea And Vomiting  . Flexeril [Cyclobenzaprine] Itching  . Sulfa Antibiotics     Pt does not recall what the  reaction was,been too long  . Synthroid [Levothyroxine] Rash   Follow-up Information    Follow up with Kennon Portela, MD On 05/12/2015.   Specialty:  Internal Medicine   Why:  Appointment time:  Thursday, May 12, 2015 at 3:30 pm with Peggyann Shoals, PA   Contact information:   [place] [address] [phone]        The results of significant diagnostics from this hospitalization (including imaging, microbiology, ancillary and laboratory) are listed below for reference.    Significant Diagnostic Studies: US Abdomen Complete  04/24/2015   CLINICAL DATA:  Emesis, elevated LFTs, smoker, anemia, hypertension, GERD  EXAM: ULTRASOUND ABDOMEN COMPLETE  COMPARISON:  CT abdomen and pelvis 03/22/2014  FINDINGS: Gallbladder: Normally distended without stones or wall thickening.  No pericholecystic fluid or sonographic Murphy sign.  Common bile duct: Diameter: Normal caliber 2 mm diameter  Liver: Echogenic, likely fatty infiltration, though this can be seen with cirrhosis and certain infiltrative disorders. No focal hepatic mass or nodularity. Hepatopetal portal venous flow.  IVC: Normal appearance  Pancreas: Inadequately visualized due to obscuration by bowel gas.  Spleen: Normal appearance, 6.0 cm length  Right Kidney: Length: 10.3 cm. Normal morphology without mass or hydronephrosis.  Left Kidney: Length: 9.9 cm. Normal morphology without mass or hydronephrosis.  Abdominal aorta: Normal caliber  Other findings: No free-fluid  IMPRESSION: Probable fatty infiltration of liver as discussed above.  Incomplete pancreatic visualization.  Otherwise normal exam.   Electronically Signed   By: Lavonia Dana M.D.   On: 04/24/2015 14:48   Dg Abd Acute W/chest  04/24/2015   CLINICAL DATA:  Migraine headaches with nausea and vomiting yesterday. The headaches have resolved. Nausea and vomiting persists.  EXAM: DG ABDOMEN ACUTE W/ 1V CHEST  COMPARISON:  None.  FINDINGS: There is no evidence of dilated  bowel loops or free intraperitoneal air. No radiopaque calculi or other significant radiographic abnormality is seen. Heart size and mediastinal contours are within normal limits. Both lungs are clear.  IMPRESSION: Negative abdominal radiographs.  No acute cardiopulmonary disease.   Electronically Signed   By: San Morelle M.D.   On: 04/24/2015 12:56    Microbiology: Recent Results (from the past 240 hour(s))  Urine culture     Status: None   Collection Time: 04/24/15  1:00 PM  Result Value Ref Range Status   Specimen Description URINE, CATHETERIZED  Final   Special Requests NONE  Final   Colony Count   Final    >=100,000 COLONIES/ML Performed at [facility]    Culture   Final    ESCHERICHIA COLI Performed at [facility]    Report Status 04/26/2015 FINAL  Final   Organism ID, Bacteria ESCHERICHIA COLI  Final      Susceptibility   Escherichia coli - MIC*    AMPICILLIN 8 SENSITIVE Sensitive     CEFAZOLIN <=4 SENSITIVE Sensitive  CEFTRIAXONE <=1 SENSITIVE Sensitive     CIPROFLOXACIN <=0.25 SENSITIVE Sensitive     GENTAMICIN <=1 SENSITIVE Sensitive     LEVOFLOXACIN <=0.12 SENSITIVE Sensitive     NITROFURANTOIN 32 SENSITIVE Sensitive     TOBRAMYCIN <=1 SENSITIVE Sensitive     TRIMETH/SULFA <=20 SENSITIVE Sensitive     PIP/TAZO <=4 SENSITIVE Sensitive     * ESCHERICHIA COLI     Labs: Basic Metabolic Panel:  Recent Labs Lab 04/24/15 1120 04/25/15 0439 04/26/15 0445 04/27/15 0817  NA 138 137 138 135  K 3.7 2.8* 2.7* 3.4*  CL 94* 101 99* 99*  CO2 19* 20* 23 24  GLUCOSE 112* 78 79 99  BUN 9 <5* <5* <5*  CREATININE 0.58 0.37* 0.41* 0.36*  CALCIUM 9.3 7.9* 8.7* 9.0  MG  --   --  1.1*  --    Liver Function Tests:  Recent Labs Lab 04/24/15 1120 04/25/15 0439  AST 168* 150*  ALT 38 27  ALKPHOS 367* 271*  BILITOT 1.1 0.7  PROT 8.4* 6.6  ALBUMIN 4.2 3.2*    Recent Labs Lab 04/24/15 1120  LIPASE 17*   No results for input(s): AMMONIA  in the last 168 hours. CBC:  Recent Labs Lab 04/24/15 1120 04/25/15 0439  WBC 13.3* 5.9  NEUTROABS 11.2*  --   HGB 11.0* 8.6*  HCT 36.1 28.4*  MCV 98.1 96.6  PLT 252 173   Cardiac Enzymes:  Recent Labs Lab 04/24/15 1620 04/24/15 2304 04/25/15 0439  TROPONINI <0.03 <0.03 <0.03   BNP: BNP (last 3 results) No results for input(s): BNP in the last 8760 hours.  ProBNP (last 3 results) No results for input(s): PROBNP in the last 8760 hours.  CBG: No results for input(s): GLUCAP in the last 168 hours.     SignedEulogio Bear  [facility] 04/27/2015, 10:50 AM

## 2015-05-12 ENCOUNTER — Ambulatory Visit: Payer: BLUE CROSS/BLUE SHIELD | Admitting: Urgent Care

## 2015-05-26 ENCOUNTER — Ambulatory Visit (INDEPENDENT_AMBULATORY_CARE_PROVIDER_SITE_OTHER): Payer: BLUE CROSS/BLUE SHIELD | Admitting: Family Medicine

## 2015-05-26 VITALS — BP 130/80 | HR 96 | Temp 97.3°F | Resp 16 | Ht 62.0 in | Wt 102.0 lb

## 2015-05-26 DIAGNOSIS — G43A0 Cyclical vomiting, not intractable: Secondary | ICD-10-CM

## 2015-05-26 DIAGNOSIS — Z8719 Personal history of other diseases of the digestive system: Secondary | ICD-10-CM

## 2015-05-26 DIAGNOSIS — R1115 Cyclical vomiting syndrome unrelated to migraine: Secondary | ICD-10-CM

## 2015-05-26 DIAGNOSIS — R531 Weakness: Secondary | ICD-10-CM

## 2015-05-26 DIAGNOSIS — F1410 Cocaine abuse, uncomplicated: Secondary | ICD-10-CM

## 2015-05-26 DIAGNOSIS — F1010 Alcohol abuse, uncomplicated: Secondary | ICD-10-CM | POA: Diagnosis not present

## 2015-05-26 LAB — CBC
HCT: 36.8 % (ref 36.0–46.0)
Hemoglobin: 12.3 g/dL (ref 12.0–15.0)
MCH: 32.4 pg (ref 26.0–34.0)
MCHC: 33.4 g/dL (ref 30.0–36.0)
MCV: 96.8 fL (ref 78.0–100.0)
MPV: 9.3 fL (ref 8.6–12.4)
Platelets: 282 10*3/uL (ref 150–400)
RBC: 3.8 MIL/uL — ABNORMAL LOW (ref 3.87–5.11)
RDW: 16.6 % — ABNORMAL HIGH (ref 11.5–15.5)
WBC: 10.3 10*3/uL (ref 4.0–10.5)

## 2015-05-26 LAB — COMPREHENSIVE METABOLIC PANEL
ALT: 39 U/L — ABNORMAL HIGH (ref 0–35)
AST: 149 U/L — ABNORMAL HIGH (ref 0–37)
Albumin: 4.4 g/dL (ref 3.5–5.2)
Alkaline Phosphatase: 401 U/L — ABNORMAL HIGH (ref 39–117)
BUN: 10 mg/dL (ref 6–23)
CO2: 14 mEq/L — ABNORMAL LOW (ref 19–32)
Calcium: 9.4 mg/dL (ref 8.4–10.5)
Chloride: 94 mEq/L — ABNORMAL LOW (ref 96–112)
Creat: 0.84 mg/dL (ref 0.50–1.10)
Glucose, Bld: 92 mg/dL (ref 70–99)
Potassium: 4.4 mEq/L (ref 3.5–5.3)
Sodium: 134 mEq/L — ABNORMAL LOW (ref 135–145)
Total Bilirubin: 1.1 mg/dL (ref 0.2–1.2)
Total Protein: 8.3 g/dL (ref 6.0–8.3)

## 2015-05-26 LAB — GLUCOSE, POCT (MANUAL RESULT ENTRY): POC Glucose: 106 mg/dl — AB (ref 70–99)

## 2015-05-26 LAB — LIPASE: Lipase: <10 U/L (ref 0–75)

## 2015-05-26 MED ORDER — ONDANSETRON HCL 8 MG PO TABS: 8.0000 mg | ORAL_TABLET | Freq: Three times a day (TID) | ORAL | Status: DC | PRN

## 2015-05-26 MED ORDER — ONDANSETRON 4 MG PO TBDP: 4.0000 mg | ORAL_TABLET | Freq: Once | ORAL | Status: DC

## 2015-05-26 NOTE — Progress Notes (Addendum)
[facility] [address], [place] [address] [phone]  Date:  05/26/2015   Name:  Holly Phelps   DOB:  1960-12-22   MRN:  [id number]  PCP:  Kennon Portela, MD    Chief Complaint: Chills; Emesis; and Depression   History of Present Illness:  Holly Phelps is a 54 y.o. very pleasant female patient who presents with the following:  Today is Thursday.  This past Tuesday she noted chills and did not feel like eating.  Yesterday she tried to eat a little bit, but vomitied.   She is not having any belly pain, has vomited twice.  No diarrhea. No sick contacts that she is aware of.   She was in the hospital a month a ago with illness- thought to have a UTI and dehydration.  She was admitted for 2 nights Her LFTs were high at that point consistent with known history of alochol abuse- she also tested posiitve for cocaine at her intake tox screen. She states that she has not used cocaine and does not know why this was positive Also states she drinks two "shots" of liquor a day- she denies any change in this pattern lately and denies any heavier drinking States that she is here today to see if she can get medication to allow her to hydrate orally.  She does not desire further intervention today  On re-interview she does admit to occasional cocaine use but denies using in the last month.  States that she was not aware that she tested positive a month ago  Patient Active Problem List   Diagnosis Date Noted  . UTI (urinary tract infection) 04/24/2015  . Intractable vomiting with nausea 04/24/2015  . Pancreatitis 01/29/2013  . Anemia 01/29/2013  . Nausea vomiting and diarrhea 01/26/2013  . Dehydration 01/26/2013  . Esophagitis 01/26/2013  . HYPOTHYROIDISM 03/01/2008  . ANXIETY 03/01/2008  . Alcohol abuse 03/01/2008  . HYPERTENSION 03/01/2008  . LIVER FUNCTION TESTS, ABNORMAL, HX OF 03/01/2008    Past Medical History  Diagnosis Date  . Allergy   .  Anemia   . 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 Hypertension   . Hypothyroidism   . GERD (gastroesophageal reflux disease)   . Headache     Past Surgical History  Procedure Laterality Date  . Laparoscopic appendectomy    . Back surgery      x 2  . Colonoscopy N/A 03/13/2013    Procedure: COLONOSCOPY;  Surgeon: Leighton Ruff, MD;  Location: [facility];  Service: Endoscopy;  Laterality: N/A;    History  Substance Use Topics  . Smoking status: Current Every Day Smoker -- 0.50 packs/day    Types: Cigarettes  . Smokeless tobacco: Not on file     Comment: 3 cigarettes per day  . Alcohol Use: 6.0 oz/week    10 Glasses of wine per week     Comment: occasional    Family History  Problem Relation Age of Onset  . Stroke Father   . Stroke Brother   . Stroke Brother     Allergies  Allergen Reactions  . Codeine Nausea And Vomiting  . Flexeril [Cyclobenzaprine] Itching  . Sulfa Antibiotics     Pt does not recall what the reaction was,been too long  . Synthroid [Levothyroxine] Rash    Medication list has been reviewed and updated.  Current Outpatient Prescriptions on File Prior to Visit  Medication Sig Dispense Refill  . acetaminophen (  TYLENOL) 500 MG tablet Take 500 mg by mouth every 6 (six) hours as needed for mild pain, moderate pain, fever or headache.    . Cranberry 500 MG CHEW Chew 1 tablet by mouth 2 (two) times daily.    Marland Kitchen docusate sodium (COLACE) 100 MG capsule Take 100 mg by mouth daily.    . ferrous gluconate (FERGON) 240 (27 FE) MG tablet Take 240 mg by mouth daily.    . folic acid (FOLVITE) 1 MG tablet Take 1 tablet (1 mg total) by mouth daily. 30 tablet 0  . Multiple Vitamin (MULTIVITAMIN WITH MINERALS) TABS Take 1 tablet by mouth daily. 30 tablet 0  . omeprazole (PRILOSEC) 20 MG capsule Take 20 mg by mouth daily.    Marland Kitchen thiamine 100 MG tablet Take 1 tablet (100 mg total) by mouth daily. 30 tablet 0  . ciprofloxacin (CIPRO) 500 MG tablet Take 1 tablet (500 mg total) by  mouth 2 (two) times daily. (Patient not taking: Reported on 05/26/2015) 4 tablet 0   No current facility-administered medications on file prior to visit.    Review of Systems:  As per HPI- otherwise negative.   Physical Examination: Filed Vitals:   05/26/15 0914  BP: 130/80  Pulse: 118  Temp: 97.3 F (36.3 C)  Resp: 16   Filed Vitals:   05/26/15 0914  Height: 5\' 2"  (1.575 m)  Weight: 102 lb (46.267 kg)   Body mass index is 18.65 kg/(m^2). Ideal Body Weight: Weight in (lb) to have BMI = 25: 136.4  GEN: WDWN, NAD, Non-toxic, A & O x 3, thin, appears frail and older than stated age HEENT: Atraumatic, Normocephalic. Neck supple. No masses, No LAD. Ears and Nose: No external deformity. CV: RRR, No M/G/R. No JVD. No thrill. No extra heart sounds. PULM: CTA B, no wheezes, crackles, rhonchi. No retractions. No resp. distress. No accessory muscle use. ABD: S, NT, ND, +BS. No rebound. No HSM.  Belly is benign EXTR: No c/c/e NEURO Normal gait.  PSYCH: Normally interactive. Conversant. Not depressed or anxious appearing.  Calm demeanor.   Given 4mg  zofran ODT. She felt a bit better, was able to sip water.  Declined IV fluids today or consideration of admision Our CBC machine is down so I had to send this test out today.  Declines any other services today  Results for orders placed or performed in visit on 05/26/15  POCT glucose (manual entry)  Result Value Ref Range   POC Glucose 106 (A) 70 - 99 mg/dl    Assessment and Plan: Non-intractable cyclical vomiting with nausea - Plan: ondansetron (ZOFRAN-ODT) disintegrating tablet 4 mg, CBC, ondansetron (ZOFRAN) 8 MG tablet  Weakness - Plan: POCT glucose (manual entry), Comprehensive metabolic panel, CBC, CANCELED: POCT CBC  History of pancreatitis - Plan: Lipase  Alcohol abuse - Plan: Lipase  Await further labs- hopefully will not see any evidence of pancreatitis or acute transaminitis  She does admit to depression in the setting  of substance abuse/ alcohol abuse but denies any SI Encouraged her to avoid drugs and to gradually decrease her alcohol use zofran as needed for nausea  Signed Lamar Blinks, MD  Called 7/8 and [facility]- labs are ok except liver function as expected.  Cutting down on alcohol is the best thing she can do for her liver.  Lipase looks ok, please call if we can help

## 2015-05-26 NOTE — Patient Instructions (Signed)
Please work on cutting down on your alcohol use and avoid using other drugs such as cocaine I will be in touch with your labs when they come in Continue to drink fluids at home and eat a bland diet when you can If you are getting worse or not better please come back and see [place] or go to the ER

## 2015-05-27 ENCOUNTER — Encounter: Payer: Self-pay | Admitting: Family Medicine

## 2015-05-27 DIAGNOSIS — F1410 Cocaine abuse, uncomplicated: Secondary | ICD-10-CM | POA: Insufficient documentation

## 2015-07-23 ENCOUNTER — Ambulatory Visit (INDEPENDENT_AMBULATORY_CARE_PROVIDER_SITE_OTHER): Payer: BLUE CROSS/BLUE SHIELD | Admitting: Family Medicine

## 2015-07-23 ENCOUNTER — Ambulatory Visit (HOSPITAL_COMMUNITY)
Admission: RE | Admit: 2015-07-23 | Discharge: 2015-07-23 | Disposition: A | Discharge status: Home / Self Care | Payer: BLUE CROSS/BLUE SHIELD | Source: Ambulatory Visit | Attending: Family Medicine | Admitting: Family Medicine

## 2015-07-23 VITALS — BP 128/80 | HR 97 | Temp 97.3°F | Resp 16 | Ht 62.0 in | Wt 104.2 lb

## 2015-07-23 DIAGNOSIS — R112 Nausea with vomiting, unspecified: Secondary | ICD-10-CM | POA: Insufficient documentation

## 2015-07-23 DIAGNOSIS — G43A0 Cyclical vomiting, not intractable: Secondary | ICD-10-CM | POA: Diagnosis not present

## 2015-07-23 DIAGNOSIS — K760 Fatty (change of) liver, not elsewhere classified: Secondary | ICD-10-CM | POA: Diagnosis not present

## 2015-07-23 DIAGNOSIS — R1030 Lower abdominal pain, unspecified: Secondary | ICD-10-CM | POA: Diagnosis present

## 2015-07-23 DIAGNOSIS — K5730 Diverticulosis of large intestine without perforation or abscess without bleeding: Secondary | ICD-10-CM | POA: Insufficient documentation

## 2015-07-23 DIAGNOSIS — I7090 Unspecified atherosclerosis: Secondary | ICD-10-CM | POA: Insufficient documentation

## 2015-07-23 DIAGNOSIS — R1115 Cyclical vomiting syndrome unrelated to migraine: Secondary | ICD-10-CM

## 2015-07-23 DIAGNOSIS — D72829 Elevated white blood cell count, unspecified: Secondary | ICD-10-CM

## 2015-07-23 LAB — POCT CBC
Granulocyte percent: 90 %G — AB (ref 37–80)
HCT, POC: 31.3 % — AB (ref 37.7–47.9)
Hemoglobin: 10.2 g/dL — AB (ref 12.2–16.2)
Lymph, poc: 1.5 (ref 0.6–3.4)
MCH, POC: 31.5 pg — AB (ref 27–31.2)
MCHC: 32.5 g/dL (ref 31.8–35.4)
MCV: 97.2 fL — AB (ref 80–97)
MID (cbc): 0.5 (ref 0–0.9)
MPV: 6.6 fL (ref 0–99.8)
POC Granulocyte: 17.7 — AB (ref 2–6.9)
POC LYMPH PERCENT: 7.6 %L — AB (ref 10–50)
POC MID %: 2.4 %M (ref 0–12)
Platelet Count, POC: 414 10*3/uL (ref 142–424)
RBC: 3.22 M/uL — AB (ref 4.04–5.48)
RDW, POC: 17.2 %
WBC: 19.7 10*3/uL — AB (ref 4.6–10.2)

## 2015-07-23 LAB — COMPLETE METABOLIC PANEL WITH GFR
ALT: 26 U/L (ref 6–29)
AST: 75 U/L — ABNORMAL HIGH (ref 10–35)
Albumin: 3.6 g/dL (ref 3.6–5.1)
Alkaline Phosphatase: 347 U/L — ABNORMAL HIGH (ref 33–130)
BUN: 6 mg/dL — ABNORMAL LOW (ref 7–25)
CO2: 25 mmol/L (ref 20–31)
Calcium: 8.6 mg/dL (ref 8.6–10.4)
Chloride: 89 mmol/L — ABNORMAL LOW (ref 98–110)
Creat: 0.44 mg/dL — ABNORMAL LOW (ref 0.50–1.05)
GFR, Est African American: >89 mL/min (ref >=60)
GFR, Est Non African American: >89 mL/min (ref >=60)
Glucose, Bld: 86 mg/dL (ref 65–99)
Potassium: 3.5 mmol/L (ref 3.5–5.3)
Sodium: 131 mmol/L — ABNORMAL LOW (ref 135–146)
Total Bilirubin: 0.9 mg/dL (ref 0.2–1.2)
Total Protein: 6.9 g/dL (ref 6.1–8.1)

## 2015-07-23 LAB — LIPASE: Lipase: 6 U/L — ABNORMAL LOW (ref 7–60)

## 2015-07-23 IMAGING — CT CT ABD-PELV W/ CM
2 of 5 series · 16 of 46 positions shown, 18 images · IV contrast (OMNIPAQUE 300)
Comparison: CT the abdomen and pelvis [DATE].

CLINICAL DATA: 54-year-old female with lower abdominal pain and
intractable nausea and vomiting.

EXAM:
CT ABDOMEN AND PELVIS WITH CONTRAST
TECHNIQUE: Multidetector CT imaging of the abdomen and pelvis was performed
using the standard protocol following bolus administration of
intravenous contrast.
CONTRAST:  70mL OMNIPAQUE IOHEXOL 300 MG/ML  SOLN

[Series 2: abd/pel with · axial · 0.74mm/px · z∈[+1116,+1481]mm · 13 of 83 slices shown, 15 images]
[im 5/83  soft-tissue]
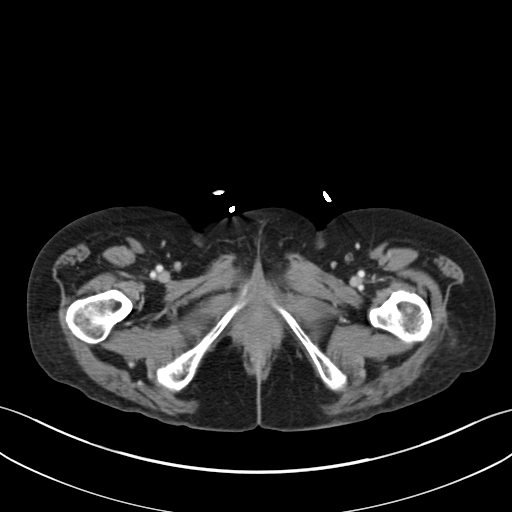
[im 5/83  bone]
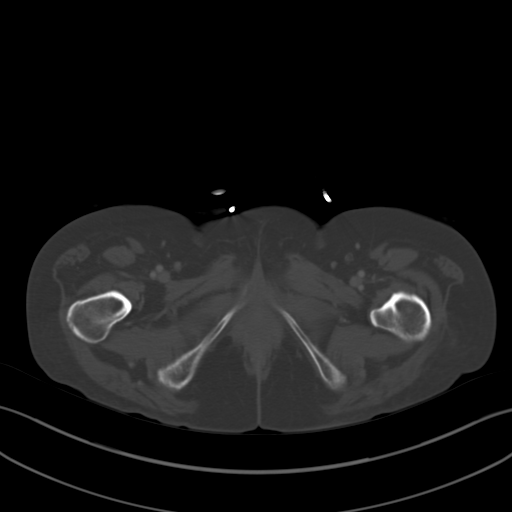
[im 13/83  soft-tissue]
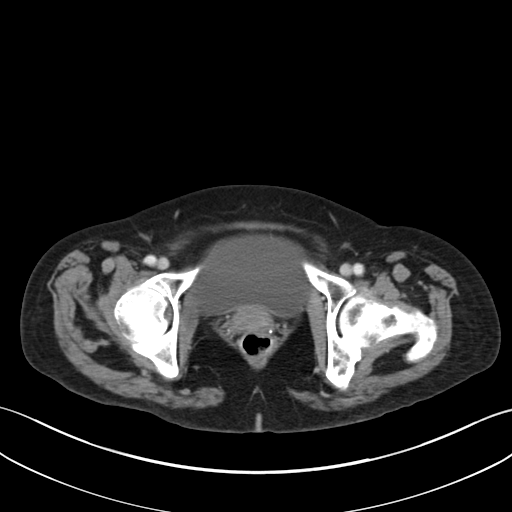
[im 18/83  soft-tissue]
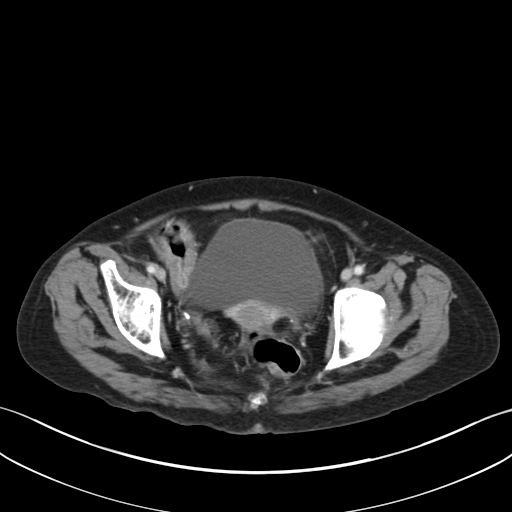
[im 22/83  soft-tissue]
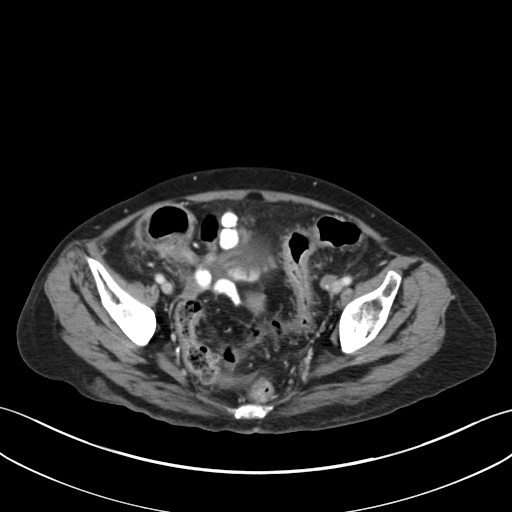
[im 31/83  soft-tissue]
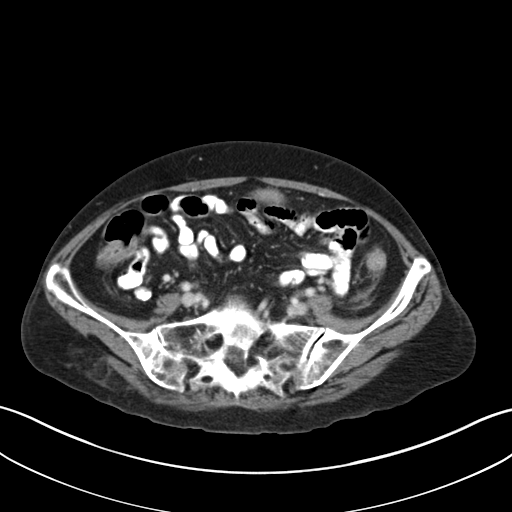
[im 35/83  soft-tissue]
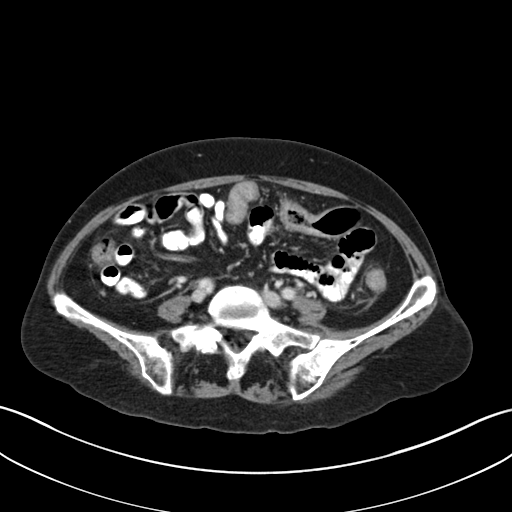
[im 44/83  soft-tissue]
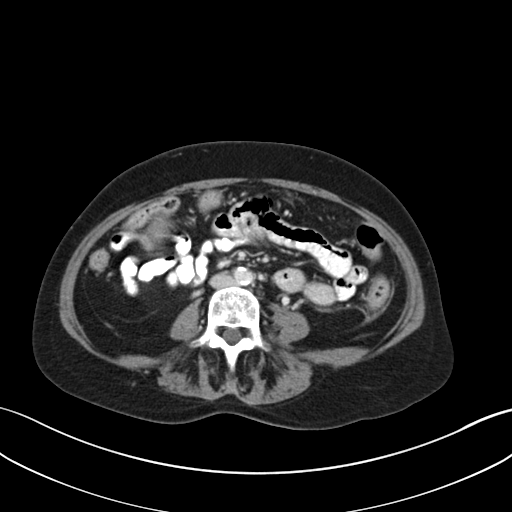
[im 48/83  soft-tissue]
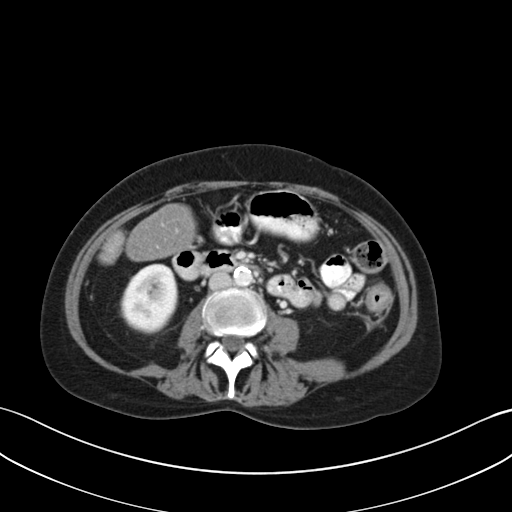
[im 52/83  soft-tissue]
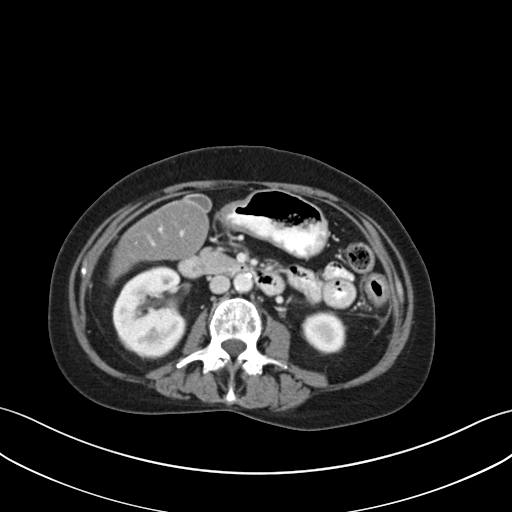
[im 52/83  bone]
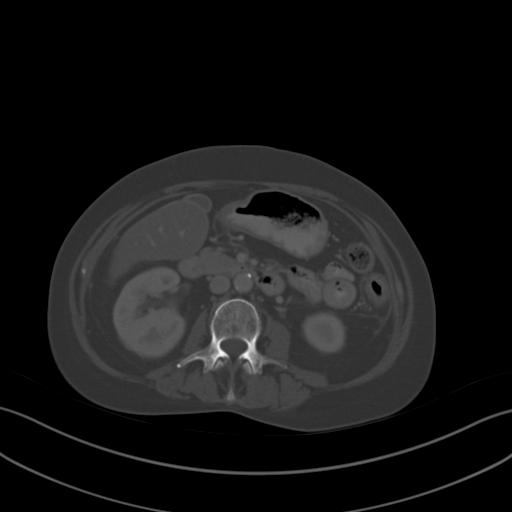
[im 61/83  soft-tissue]
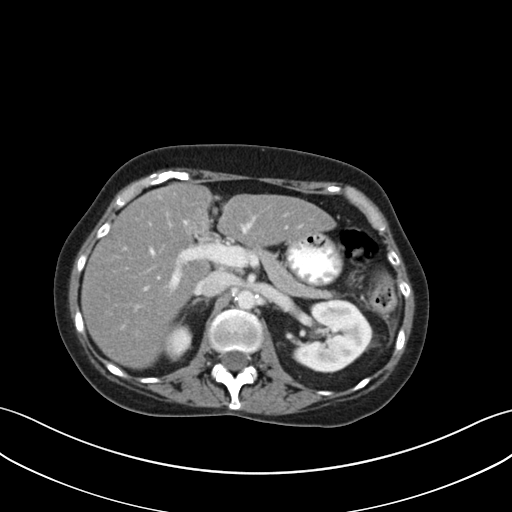
[im 65/83  soft-tissue]
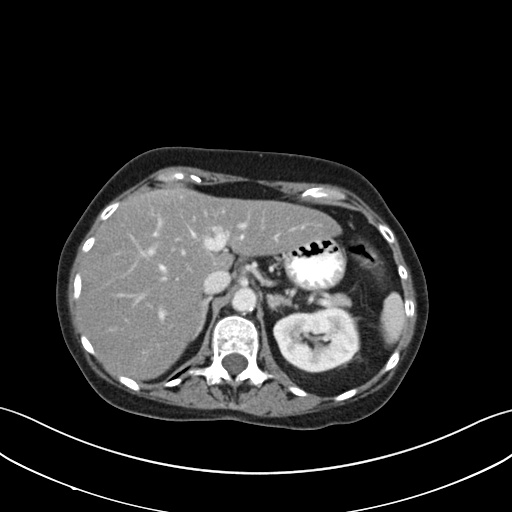
[im 70/83  soft-tissue]
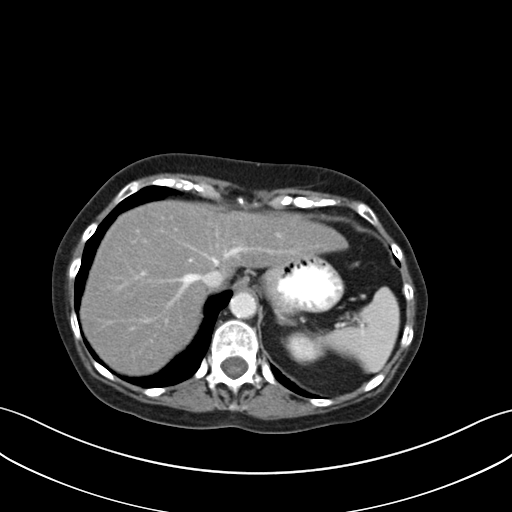
[im 78/83  soft-tissue]
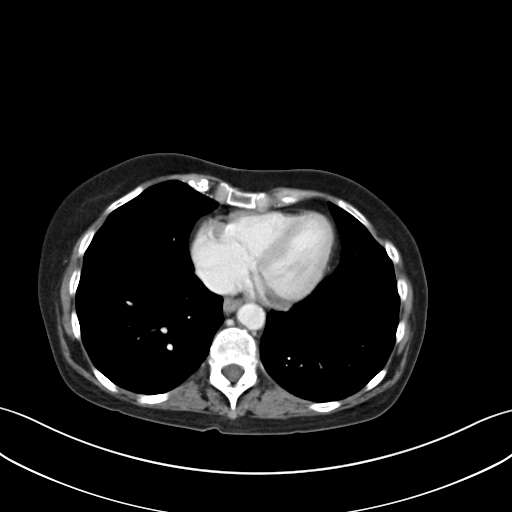

[Series 4: coronal a/|p · coronal · 0.86mm/px · 3 of 93 slices shown]
[im 31/93  soft-tissue]
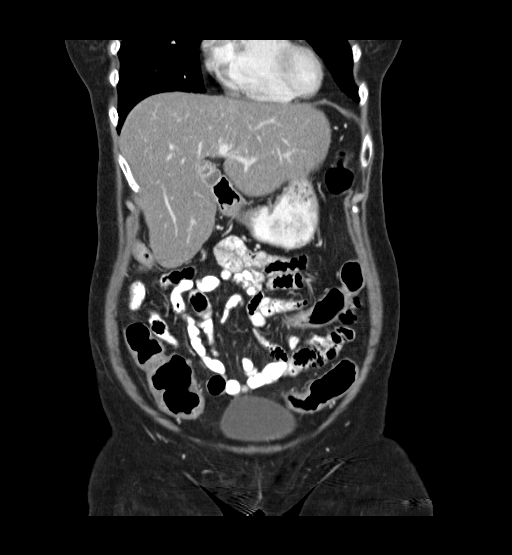
[im 41/93  soft-tissue]
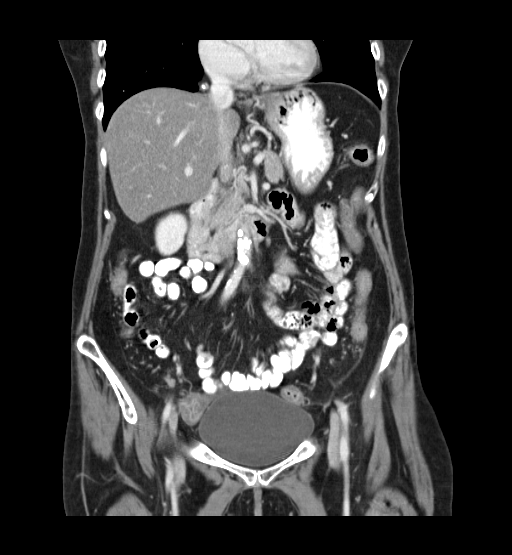
[im 52/93  soft-tissue]
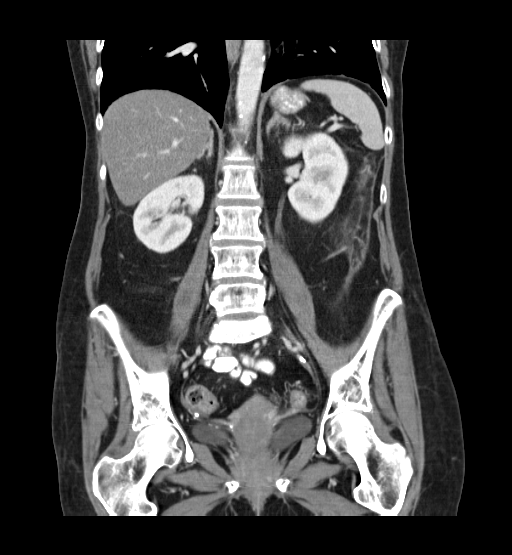

[16 of 46 positions shown; findings below may reference images not displayed]

FINDINGS: Lower chest:  Unremarkable.

Hepatobiliary: Diffuse low attenuation throughout the hepatic
parenchyma, compatible with hepatic steatosis. No discrete cystic or
solid hepatic lesions are identified. No intra or extrahepatic
biliary ductal dilatation. Gallbladder is normal in appearance.

Pancreas: No pancreatic mass. No pancreatic ductal dilatation. No
pancreatic or peripancreatic fluid or inflammatory changes.

Spleen: Unremarkable.

Adrenals/Urinary Tract: Sub cm low-attenuation lesion in the
anterior aspect of the interpolar region of the right kidney is too
small to definitively characterize, but similar to the prior
examination, likely a tiny cyst. Left kidney and bilateral adrenal
glands are normal in appearance. No hydroureteronephrosis. Urinary
bladder is normal in appearance.

Stomach/Bowel: Normal appearance of the stomach. No pathologic
dilatation of small bowel or colon. Multifocal colonic wall
thickening, most severe in the descending colon where there are
surrounding inflammatory changes, likely to reflect colitis.
Numerous colonic diverticulae are noted as well. The possibility of
diverticulitis is not excluded, but is not strongly favored.

Vascular/Lymphatic: Atherosclerosis throughout the abdominal and
pelvic vasculature, without evidence of aneurysm or dissection. No
lymphadenopathy noted in the abdomen or pelvis.

Reproductive: Uterus and ovaries are atrophic.

Other: Trace volume of ascites, likely reactive. No
pneumoperitoneum.

Musculoskeletal: There are no aggressive appearing lytic or blastic
lesions noted in the visualized portions of the skeleton.
IMPRESSION: 1. The appearance of the colon suggests colitis, most severe in the
region of the descending colon.
2. There are numerous colonic diverticulae as well. Although the
possibility of diverticulitis is not excluded, it is not strongly
favored at this time.
3. Hepatic steatosis.
4. Extensive atherosclerosis.
5. Additional incidental findings, as above.

## 2015-07-23 MED ORDER — CIPROFLOXACIN HCL 500 MG PO TABS: 500.0000 mg | ORAL_TABLET | Freq: Two times a day (BID) | ORAL | Status: DC

## 2015-07-23 MED ORDER — CEFTRIAXONE SODIUM 1 G IJ SOLR
1.0000 g | Freq: Once | INTRAMUSCULAR | Status: AC
  Administered 2015-07-23: 1 g via INTRAMUSCULAR

## 2015-07-23 MED ORDER — IOHEXOL 300 MG/ML  SOLN
100.0000 mL | Freq: Once | INTRAMUSCULAR | Status: AC | PRN
  Administered 2015-07-23: 70 mL via INTRAVENOUS

## 2015-07-23 MED ORDER — ONDANSETRON HCL 8 MG PO TABS: 8.0000 mg | ORAL_TABLET | Freq: Three times a day (TID) | ORAL | Status: DC | PRN

## 2015-07-23 MED ORDER — METRONIDAZOLE 500 MG PO TABS: 500.0000 mg | ORAL_TABLET | Freq: Two times a day (BID) | ORAL | Status: DC

## 2015-07-23 NOTE — Patient Instructions (Addendum)
Please go to [facility] in through the Emergency Room Department Let them know you are there as an OUTPATIENT for an scheduled CT Someone from the Radiology Department will come and get you      Abdominal Pain Many things can cause abdominal pain. Usually, abdominal pain is not caused by a disease and will improve without treatment. It can often be observed and treated at home. Your health care provider will do a physical exam and possibly order blood tests and X-rays to help determine the seriousness of your pain. However, in many cases, more time must pass before a clear cause of the pain can be found. Before that point, your health care provider may not know if you need more testing or further treatment. HOME CARE INSTRUCTIONS  Monitor your abdominal pain for any changes. The following actions may help to alleviate any discomfort you are experiencing:  Only take over-the-counter or prescription medicines as directed by your health care provider.  Do not take laxatives unless directed to do so by your health care provider.  Try a clear liquid diet (broth, tea, or water) as directed by your health care provider. Slowly move to a bland diet as tolerated. SEEK MEDICAL CARE IF:  You have unexplained abdominal pain.  You have abdominal pain associated with nausea or diarrhea.  You have pain when you urinate or have a bowel movement.  You experience abdominal pain that wakes you in the night.  You have abdominal pain that is worsened or improved by eating food.  You have abdominal pain that is worsened with eating fatty foods.  You have a fever. SEEK IMMEDIATE MEDICAL CARE IF:   Your pain does not go away within 2 hours.  You keep throwing up (vomiting).  Your pain is felt only in portions of the abdomen, such as the right side or the left lower portion of the abdomen.  You pass bloody or black tarry stools. MAKE SURE YOU:  Understand these instructions.    Will watch your condition.   Will get help right away if you are not doing well or get worse.  Document Released: 08/15/2005 Document Revised: 11/10/2013 Document Reviewed: 07/15/2013 [facility] Patient Information 2015 [place], [place]. This information is not intended to replace advice given to you by your health care provider. Make sure you discuss any questions you have with your health care provider. Diverticulitis Diverticulitis is inflammation or infection of small pouches in your colon that form when you have a condition called diverticulosis. The pouches in your colon are called diverticula. Your colon, or large intestine, is where water is absorbed and stool is formed. Complications of diverticulitis can include:  Bleeding.  Severe infection.  Severe pain.  Perforation of your colon.  Obstruction of your colon. CAUSES  Diverticulitis is caused by bacteria. Diverticulitis happens when stool becomes trapped in diverticula. This allows bacteria to grow in the diverticula, which can lead to inflammation and infection. RISK FACTORS People with diverticulosis are at risk for diverticulitis. Eating a diet that does not include enough fiber from fruits and vegetables may make diverticulitis more likely to develop. SYMPTOMS  Symptoms of diverticulitis may include:  Abdominal pain and tenderness. The pain is normally located on the left side of the abdomen, but may occur in other areas.  Fever and chills.  Bloating.  Cramping.  Nausea.  Vomiting.  Constipation.  Diarrhea.  Blood in your stool. DIAGNOSIS  Your health care provider will ask you about your medical  history and do a physical exam. You may need to have tests done because many medical conditions can cause the same symptoms as diverticulitis. Tests may include:  Blood tests.  Urine tests.  Imaging tests of the abdomen, including X-rays and CT scans. When your condition is under control, your health care  provider may recommend that you have a colonoscopy. A colonoscopy can show how severe your diverticula are and whether something else is causing your symptoms. TREATMENT  Most cases of diverticulitis are mild and can be treated at home. Treatment may include:  Taking over-the-counter pain medicines.  Following a clear liquid diet.  Taking antibiotic medicines by mouth for 7-10 days. More severe cases may be treated at a hospital. Treatment may include:  Not eating or drinking.  Taking prescription pain medicine.  Receiving antibiotic medicines through an IV tube.  Receiving fluids and nutrition through an IV tube.  Surgery. HOME CARE INSTRUCTIONS   Follow your health care provider's instructions carefully.  Follow a full liquid diet or other diet as directed by your health care provider. After your symptoms improve, your health care provider may tell you to change your diet. He or she may recommend you eat a high-fiber diet. Fruits and vegetables are good sources of fiber. Fiber makes it easier to pass stool.  Take fiber supplements or probiotics as directed by your health care provider.  Only take medicines as directed by your health care provider.  Keep all your follow-up appointments. SEEK MEDICAL CARE IF:   Your pain does not improve.  You have a hard time eating food.  Your bowel movements do not return to normal. SEEK IMMEDIATE MEDICAL CARE IF:   Your pain becomes worse.  Your symptoms do not get better.  Your symptoms suddenly get worse.  You have a fever.  You have repeated vomiting.  You have bloody or black, tarry stools. MAKE SURE YOU:   Understand these instructions.  Will watch your condition.  Will get help right away if you are not doing well or get worse. Document Released: 08/15/2005 Document Revised: 11/10/2013 Document Reviewed: 09/30/2013 [facility] Patient Information 2015 [place], [place]. This information is not intended to replace advice  given to you by your health care provider. Make sure you discuss any questions you have with your health care provider.

## 2015-07-23 NOTE — Progress Notes (Signed)
Chief Complaint:  Chief Complaint  Patient presents with  . Abdominal Pain    X 4 days  . Diarrhea    X 2 days    HPI: Holly Phelps is a 54 y.o. female who reports to [facility] today complaining of throwing up x 1  and also abd pain in the lower abd and also back pain since Tuesday for the last 4 days, no urination issues, she has had bowel movement 4 times since this AM and the last one was loose. The last stool was nonbloody diarrhea. Last colonscopy was 2 years ago, + diverticulosis.  Has had some nausea. Denies fevers or chills. Still able to eat and drink fluids without problems. 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 She has had elevated liver enzymes, she has had a negative actue hepatitis panel , last one was done 4 months ago. She has hypothyroidism.    She has about 8/10 sharp is is an aching and sharp pain, constant.   EGD done in 2014 by Dr Benson Norway was negative for any pathology Colonoscopy 05/3427 By Dr Leighton Ruff found diverticulum in transcending, ascending and descending colon  Lab Results  Component Value Date   HEPAIGM NON REACTIVE 03/15/2015   HEPBIGM NON REACTIVE 03/15/2015    Lab Results  Component Value Date   TSH 5.782* 03/13/2015   T4TOTAL 6.6 03/13/2015   CBC Latest Ref Rng 07/23/2015 05/26/2015 04/25/2015  WBC 4.6 - 10.2 K/uL 19.7(A) 10.3 5.9  Hemoglobin 12.2 - 16.2 g/dL 10.2(A) 12.3 8.6(L)  Hematocrit 37.7 - 47.9 % 31.3(A) 36.8 28.4(L)  Platelets 150 - 400 K/uL - 282 173     Past Medical History  Diagnosis Date  . Allergy   . Anemia   . 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 Hypertension   . Hypothyroidism   . GERD (gastroesophageal reflux disease)   . Headache    Past Surgical History  Procedure Laterality Date  .  Laparoscopic appendectomy    . Back surgery      x 2  . Colonoscopy N/A 03/13/2013    Procedure: COLONOSCOPY;  Surgeon: Leighton Ruff, MD;  Location: [facility];  Service: Endoscopy;  Laterality: N/A;   Social History   Social History  . Marital Status: Married    Spouse Name: N/A  . Number of Children: N/A  . Years of Education: N/A   Social History Main Topics  . Smoking status: Current Every Day Smoker -- 0.50 packs/day    Types: Cigarettes  . Smokeless tobacco: None     Comment: 3 cigarettes per day  . Alcohol Use: 6.0 oz/week    10 Glasses of wine per week     Comment: occasional  . Drug Use: No  . Sexual Activity: Not Currently   Other Topics Concern  . None   Social History Narrative   Family History  Problem Relation Age of Onset  . Stroke Father   . Stroke Brother   . Stroke Brother    Allergies  Allergen Reactions  . Codeine Nausea And Vomiting  . Flexeril [Cyclobenzaprine] Itching  . Sulfa Antibiotics     Pt does not recall what the reaction was,been too  long  . Synthroid [Levothyroxine] Rash   Prior to Admission medications   Medication Sig Start Date End Date Taking? Authorizing Provider  acetaminophen (TYLENOL) 500 MG tablet Take 500 mg by mouth every 6 (six) hours as needed for mild pain, moderate pain, fever or headache.   Yes Historical Provider, MD  Cranberry 500 MG CHEW Chew 1 tablet by mouth 2 (two) times daily.   Yes Historical Provider, MD  docusate sodium (COLACE) 100 MG capsule Take 100 mg by mouth daily.   Yes Historical Provider, MD  ferrous gluconate (FERGON) 240 (27 FE) MG tablet Take 240 mg by mouth daily.   Yes Historical Provider, MD  folic acid (FOLVITE) 1 MG tablet Take 1 tablet (1 mg total) by mouth daily. 04/27/15  Yes [name], DO  Multiple Vitamin (MULTIVITAMIN WITH MINERALS) TABS Take 1 tablet by mouth daily. 01/29/13  Yes [name], MD  omeprazole (PRILOSEC) 20 MG capsule Take 20 mg by mouth daily.   Yes Historical  Provider, MD  ondansetron (ZOFRAN) 8 MG tablet Take 1 tablet (8 mg total) by mouth every 8 (eight) hours as needed for nausea or vomiting. Patient not taking: Reported on 07/23/2015 05/26/15   Darreld Mclean, MD  thiamine 100 MG tablet Take 1 tablet (100 mg total) by mouth daily. Patient not taking: Reported on 07/23/2015 04/27/15   [name], DO     ROS: The patient denies fevers, night sweats, unintentional weight loss, chest pain, palpitations, wheezing, dyspnea on exertion,  dysuria, hematuria, melena, numbness, weakness, or tingling.   All other systems have been reviewed and were otherwise negative with the exception of those mentioned in the HPI and as above.    PHYSICAL EXAM: Filed Vitals:   07/23/15 0923  BP: 128/80  Pulse: 97  Temp: 97.3 F (36.3 C)  Resp: 16   Body mass index is 19.05 kg/(m^2).   General: Alert, minimal  acute distress HEENT:  Normocephalic, atraumatic, oropharynx patent. EOMI, PERRLA Cardiovascular:  Regular rate and rhythm, no rubs murmurs or gallops.  No Carotid bruits, radial pulse intact. No pedal edema.  Respiratory: Clear to auscultation bilaterally.  No wheezes, rales, or rhonchi.  No cyanosis, no use of accessory musculature Abdominal: No organomegaly, abdomen is soft and tender lower abd diffusely , positive bowel sounds. No masses. She has some guarding,  Skin: No rashes. Neurologic: Facial musculature symmetric. Psychiatric: Patient acts appropriately throughout our interaction. Lymphatic: No cervical or submandibular lymphadenopathy Musculoskeletal: Gait intact. No edema, tenderness   LABS: Results for orders placed or performed in visit on 07/23/15  POCT CBC  Result Value Ref Range   WBC 19.7 (A) 4.6 - 10.2 K/uL   Lymph, poc 1.5 0.6 - 3.4   POC LYMPH PERCENT 7.6 (A) 10 - 50 %L   MID (cbc) 0.5 0 - 0.9   POC MID % 2.4 0 - 12 %M   POC Granulocyte 17.7 (A) 2 - 6.9   Granulocyte percent 90.0 (A) 37 - 80 %G   RBC 3.22 (A) 4.04 - 5.48  M/uL   Hemoglobin 10.2 (A) 12.2 - 16.2 g/dL   HCT, POC 31.3 (A) 37.7 - 47.9 %   MCV 97.2 (A) 80 - 97 fL   MCH, POC 31.5 (A) 27 - 31.2 pg   MCHC 32.5 31.8 - 35.4 g/dL   RDW, POC 17.2 %   Platelet Count, POC 414 142 - 424 K/uL   MPV 6.6 0 - 99.8 fL  EKG/XRAY:   Primary read interpreted by Dr. Marin Comment at [facility].   ASSESSMENT/PLAN: Encounter Diagnoses  Name Primary?  . Lower abdominal pain Yes  . Non-intractable vomiting with nausea, vomiting of unspecified type   . Diverticulosis of colon without hemorrhage   . Non-intractable cyclical vomiting with nausea   . Leukocytosis    54 y/o female with history of PMH cocaine use, alcohol use and pancreatitis, elevated liver enzymes, diverticular disease, appendectomy who presents with diffuse lower left abd pain, diarrhea, chills and back pain. She is UTD on colonscopy and EGD She cannot give me a urine specimen, will bring it back to me I am unable to give her any pain meds at this time until CT scan results, we are very limited due to her polysubstance abuse issues, codeine allergy and also elevated liver enzymes Will cover with 1 gram rocephin for leuckocytosis , awaiting CT results Rx zofran, flagyl, cipro to take at home Rule out diveticulitis, abscess, colitis, malignancy Fu by phone with results, she declines to go to the ER after risks and benefits dw patient Advise to return in AM for recheck if she is not going to ED, needs to let me know if she is not going to get CT scan  Gross sideeffects, risk and benefits, and alternatives of medications d/w patient. Patient is aware that all medications have potential sideeffects and we are unable to predict every sideeffect or drug-drug interaction that may occur.  Jadie Comas DO  07/23/2015 11:13 AM

## 2015-07-24 ENCOUNTER — Other Ambulatory Visit: Payer: Self-pay | Admitting: Family Medicine

## 2015-07-24 ENCOUNTER — Telehealth: Payer: Self-pay | Admitting: Family Medicine

## 2015-07-24 DIAGNOSIS — K5732 Diverticulitis of large intestine without perforation or abscess without bleeding: Secondary | ICD-10-CM

## 2015-07-24 DIAGNOSIS — K529 Noninfective gastroenteritis and colitis, unspecified: Secondary | ICD-10-CM

## 2015-07-24 LAB — POCT URINALYSIS DIPSTICK
Blood, UA: NEGATIVE
Glucose, UA: NEGATIVE
Ketones, UA: 160
Leukocytes, UA: NEGATIVE
Nitrite, UA: NEGATIVE
Protein, UA: NEGATIVE
Spec Grav, UA: 1.015
Urobilinogen, UA: 0.2
pH, UA: 6.5

## 2015-07-24 LAB — POCT UA - MICROSCOPIC ONLY
Casts, Ur, LPF, POC: NEGATIVE
Crystals, Ur, HPF, POC: NEGATIVE
Mucus, UA: NEGATIVE
Yeast, UA: NEGATIVE

## 2015-07-24 NOTE — Telephone Encounter (Signed)
LM about labs and CT scan results, she needs to return to office today  for recheck to see if leukocytosis trending down with abx  And if anemia is stable. I have referred her urgently to GI. Pain management an issue since she abuses etoh, cocaine last UDS was + in 04/2015, she should not take tylenol due to elevated liver enzymes or NSAIDs due to anemia and has codeine allergy

## 2015-07-25 ENCOUNTER — Telehealth: Payer: Self-pay | Admitting: Family Medicine

## 2015-07-25 NOTE — Telephone Encounter (Signed)
Lm to see how she is doing

## 2015-09-07 ENCOUNTER — Encounter (HOSPITAL_COMMUNITY): Payer: Self-pay | Admitting: Emergency Medicine

## 2015-09-07 ENCOUNTER — Emergency Department (HOSPITAL_COMMUNITY): Payer: BLUE CROSS/BLUE SHIELD

## 2015-09-07 ENCOUNTER — Inpatient Hospital Stay (HOSPITAL_COMMUNITY)
Admission: EM | Admit: 2015-09-07 | Discharge: 2015-09-10 | DRG: 372 | Disposition: A | Discharge status: Home / Self Care | Payer: BLUE CROSS/BLUE SHIELD | Attending: Internal Medicine | Admitting: Internal Medicine

## 2015-09-07 DIAGNOSIS — F17200 Nicotine dependence, unspecified, uncomplicated: Secondary | ICD-10-CM | POA: Diagnosis present

## 2015-09-07 DIAGNOSIS — R51 Headache: Secondary | ICD-10-CM | POA: Diagnosis present

## 2015-09-07 DIAGNOSIS — A0472 Enterocolitis due to Clostridium difficile, not specified as recurrent: Secondary | ICD-10-CM | POA: Diagnosis present

## 2015-09-07 DIAGNOSIS — Z823 Family history of stroke: Secondary | ICD-10-CM | POA: Diagnosis not present

## 2015-09-07 DIAGNOSIS — I10 Essential (primary) hypertension: Secondary | ICD-10-CM | POA: Diagnosis present

## 2015-09-07 DIAGNOSIS — F17210 Nicotine dependence, cigarettes, uncomplicated: Secondary | ICD-10-CM | POA: Diagnosis present

## 2015-09-07 DIAGNOSIS — E876 Hypokalemia: Secondary | ICD-10-CM | POA: Diagnosis present

## 2015-09-07 DIAGNOSIS — R159 Full incontinence of feces: Secondary | ICD-10-CM | POA: Diagnosis present

## 2015-09-07 DIAGNOSIS — Z79899 Other long term (current) drug therapy: Secondary | ICD-10-CM | POA: Diagnosis not present

## 2015-09-07 DIAGNOSIS — R200 Anesthesia of skin: Secondary | ICD-10-CM | POA: Diagnosis present

## 2015-09-07 DIAGNOSIS — Z681 Body mass index (BMI) 19 or less, adult: Secondary | ICD-10-CM

## 2015-09-07 DIAGNOSIS — Z888 Allergy status to other drugs, medicaments and biological substances status: Secondary | ICD-10-CM

## 2015-09-07 DIAGNOSIS — A047 Enterocolitis due to Clostridium difficile: Principal | ICD-10-CM | POA: Diagnosis present

## 2015-09-07 DIAGNOSIS — E872 Acidosis: Secondary | ICD-10-CM | POA: Diagnosis present

## 2015-09-07 DIAGNOSIS — F1410 Cocaine abuse, uncomplicated: Secondary | ICD-10-CM | POA: Diagnosis not present

## 2015-09-07 DIAGNOSIS — F1010 Alcohol abuse, uncomplicated: Secondary | ICD-10-CM | POA: Diagnosis present

## 2015-09-07 DIAGNOSIS — E039 Hypothyroidism, unspecified: Secondary | ICD-10-CM | POA: Diagnosis present

## 2015-09-07 DIAGNOSIS — Z882 Allergy status to sulfonamides status: Secondary | ICD-10-CM

## 2015-09-07 DIAGNOSIS — Z791 Long term (current) use of non-steroidal anti-inflammatories (NSAID): Secondary | ICD-10-CM

## 2015-09-07 DIAGNOSIS — K219 Gastro-esophageal reflux disease without esophagitis: Secondary | ICD-10-CM | POA: Diagnosis present

## 2015-09-07 DIAGNOSIS — Z885 Allergy status to narcotic agent status: Secondary | ICD-10-CM

## 2015-09-07 DIAGNOSIS — R1030 Lower abdominal pain, unspecified: Secondary | ICD-10-CM | POA: Diagnosis present

## 2015-09-07 LAB — CBC WITH DIFFERENTIAL/PLATELET
Basophils Absolute: 0 10*3/uL (ref 0.0–0.1)
Basophils Relative: 0 %
Eosinophils Absolute: 0 10*3/uL (ref 0.0–0.7)
Eosinophils Relative: 0 %
HCT: 26.8 % — ABNORMAL LOW (ref 36.0–46.0)
Hemoglobin: 9 g/dL — ABNORMAL LOW (ref 12.0–15.0)
Lymphocytes Relative: 3 %
Lymphs Abs: 0.6 10*3/uL — ABNORMAL LOW (ref 0.7–4.0)
MCH: 33.3 pg (ref 26.0–34.0)
MCHC: 33.6 g/dL (ref 30.0–36.0)
MCV: 99.3 fL (ref 78.0–100.0)
Monocytes Absolute: 1.3 10*3/uL — ABNORMAL HIGH (ref 0.1–1.0)
Monocytes Relative: 7 %
Neutro Abs: 17.4 10*3/uL — ABNORMAL HIGH (ref 1.7–7.7)
Neutrophils Relative %: 90 %
Platelets: 334 10*3/uL (ref 150–400)
RBC: 2.7 MIL/uL — ABNORMAL LOW (ref 3.87–5.11)
RDW: 14.8 % (ref 11.5–15.5)
WBC: 19.3 10*3/uL — ABNORMAL HIGH (ref 4.0–10.5)

## 2015-09-07 LAB — C DIFFICILE QUICK SCREEN W PCR REFLEX
C Diff antigen: POSITIVE — AB
C Diff toxin: NEGATIVE

## 2015-09-07 LAB — URINALYSIS, ROUTINE W REFLEX MICROSCOPIC
Bilirubin Urine: NEGATIVE
Glucose, UA: NEGATIVE mg/dL
Hgb urine dipstick: NEGATIVE
Ketones, ur: 15 mg/dL — AB
Leukocytes, UA: NEGATIVE
Nitrite: NEGATIVE
Protein, ur: NEGATIVE mg/dL
Specific Gravity, Urine: 1.006 (ref 1.005–1.030)
Urobilinogen, UA: 0.2 mg/dL (ref 0.0–1.0)
pH: 7.5 (ref 5.0–8.0)

## 2015-09-07 LAB — COMPREHENSIVE METABOLIC PANEL
ALT: 16 U/L (ref 14–54)
AST: 52 U/L — ABNORMAL HIGH (ref 15–41)
Albumin: 2.8 g/dL — ABNORMAL LOW (ref 3.5–5.0)
Alkaline Phosphatase: 246 U/L — ABNORMAL HIGH (ref 38–126)
Anion gap: 15 (ref 5–15)
BUN: 8 mg/dL (ref 6–20)
CO2: 27 mmol/L (ref 22–32)
Calcium: 7.1 mg/dL — ABNORMAL LOW (ref 8.9–10.3)
Chloride: 94 mmol/L — ABNORMAL LOW (ref 101–111)
Creatinine, Ser: 0.45 mg/dL (ref 0.44–1.00)
GFR calc Af Amer: >60 mL/min (ref >60)
GFR calc non Af Amer: >60 mL/min (ref >60)
Glucose, Bld: 88 mg/dL (ref 65–99)
Potassium: 3 mmol/L — ABNORMAL LOW (ref 3.5–5.1)
Sodium: 136 mmol/L (ref 135–145)
Total Bilirubin: 1.2 mg/dL (ref 0.3–1.2)
Total Protein: 6.3 g/dL — ABNORMAL LOW (ref 6.5–8.1)

## 2015-09-07 LAB — I-STAT CG4 LACTIC ACID, ED: Lactic Acid, Venous: 1.27 mmol/L (ref 0.5–2.0)

## 2015-09-07 LAB — LIPASE, BLOOD: Lipase: 15 U/L — ABNORMAL LOW (ref 22–51)

## 2015-09-07 IMAGING — CT CT ABD-PELV W/ CM
2 of 5 series · 16 of 46 positions shown, 18 images · IV contrast (OMNIPAQUE 300)
Comparison: [DATE]

CLINICAL DATA: Leukocytosis and abdominal pain

EXAM:
CT ABDOMEN AND PELVIS WITH CONTRAST
TECHNIQUE: Multidetector CT imaging of the abdomen and pelvis was performed
using the standard protocol following bolus administration of
intravenous contrast.
CONTRAST:  75mL OMNIPAQUE IOHEXOL 300 MG/ML SOLN, 25mL OMNIPAQUE
IOHEXOL 300 MG/ML SOLN

[Series 2: abd/pel with · axial · 0.63mm/px · z∈[+1016,+1386]mm · 13 of 84 slices shown, 15 images]
[im 5/84  soft-tissue]
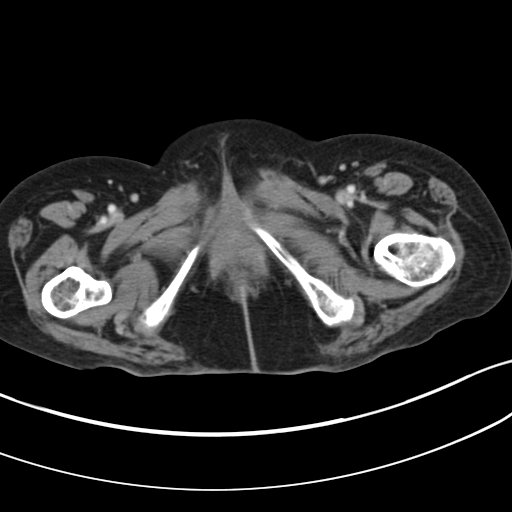
[im 5/84  bone]
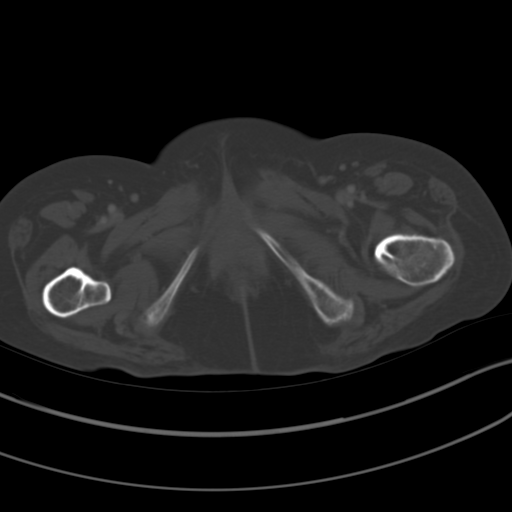
[im 14/84  soft-tissue]
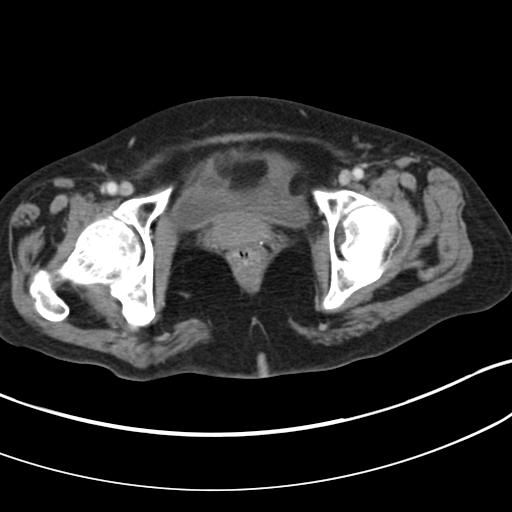
[im 18/84  soft-tissue]
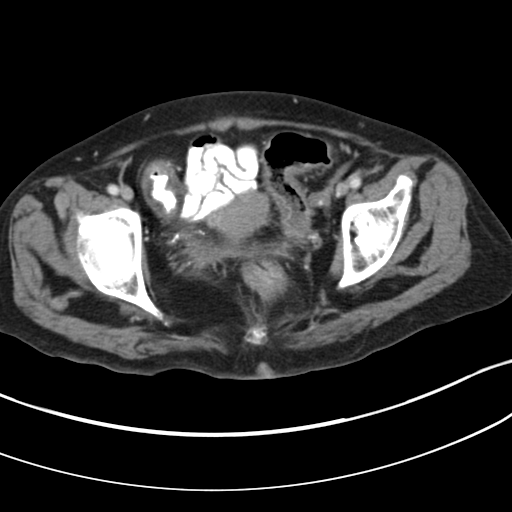
[im 22/84  soft-tissue]
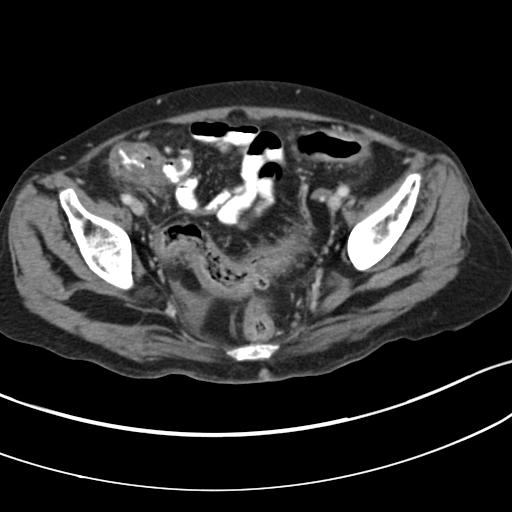
[im 31/84  soft-tissue]
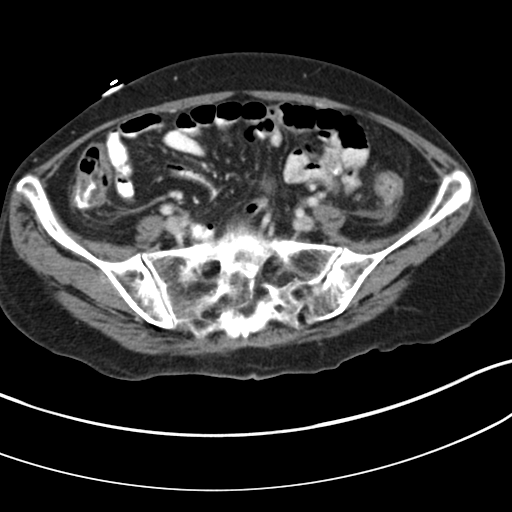
[im 35/84  soft-tissue]
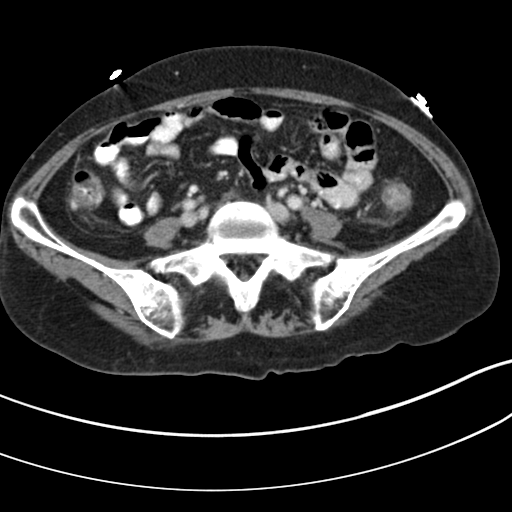
[im 44/84  soft-tissue]
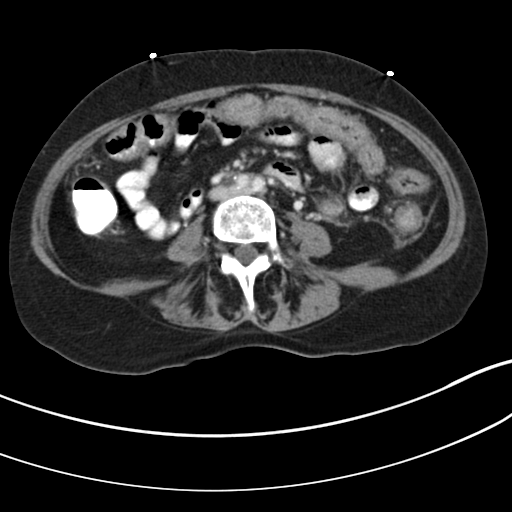
[im 49/84  soft-tissue]
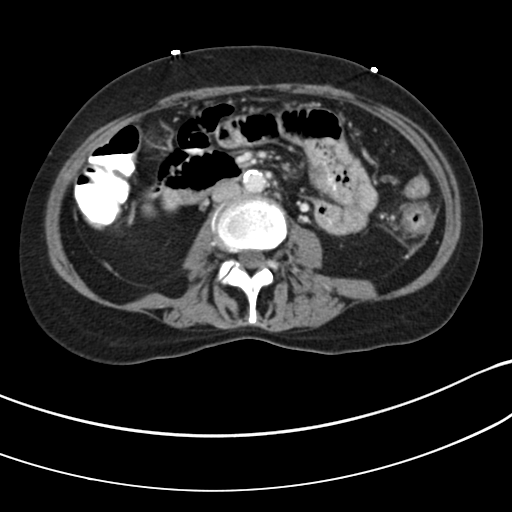
[im 53/84  soft-tissue]
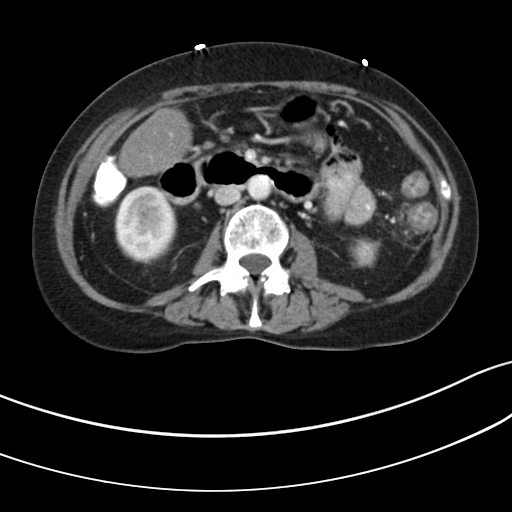
[im 53/84  bone]
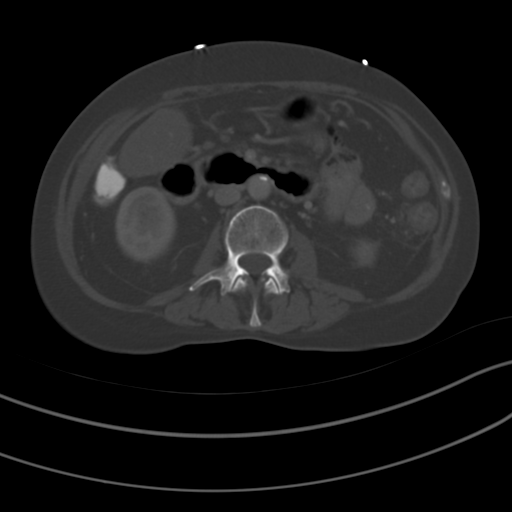
[im 62/84  soft-tissue]
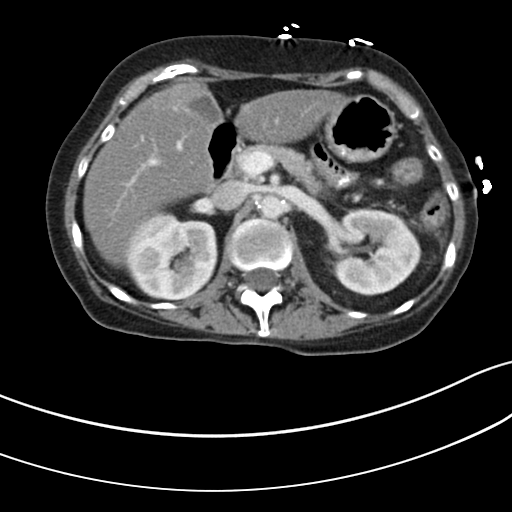
[im 66/84  soft-tissue]
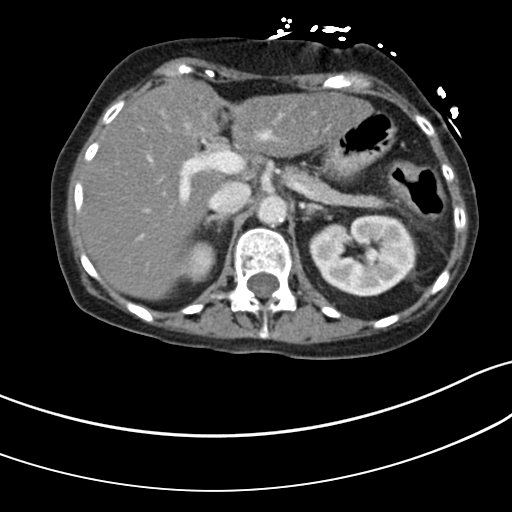
[im 70/84  soft-tissue]
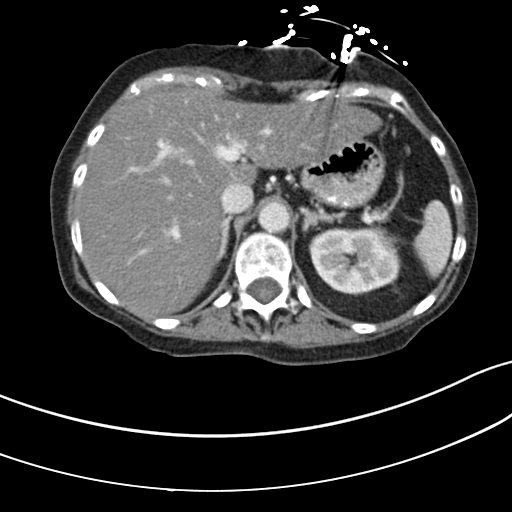
[im 79/84  soft-tissue]
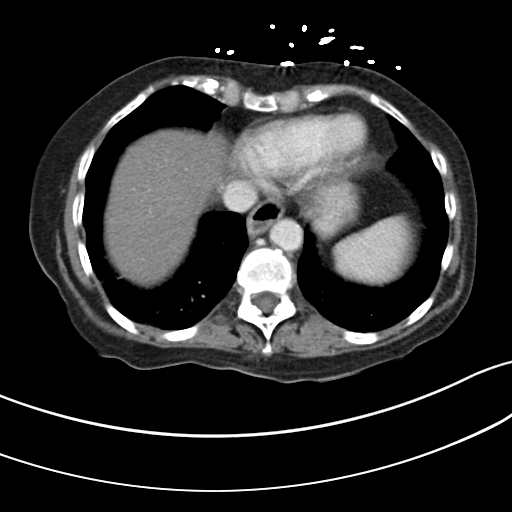

[Series 5: coronal a/|p · coronal · 0.72mm/px · 3 of 86 slices shown]
[im 29/86  soft-tissue]
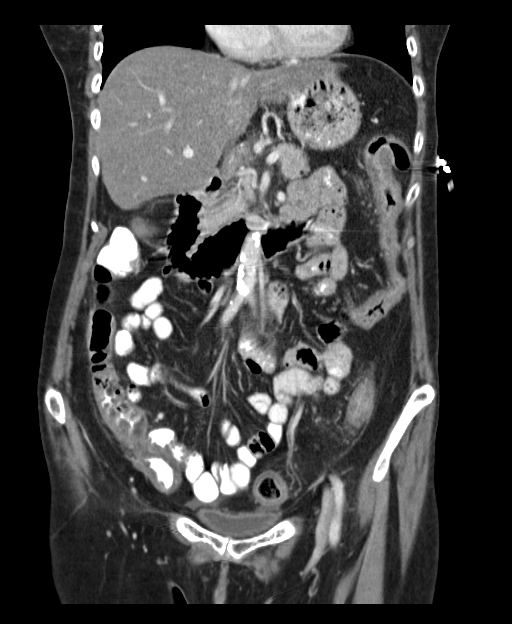
[im 38/86  soft-tissue]
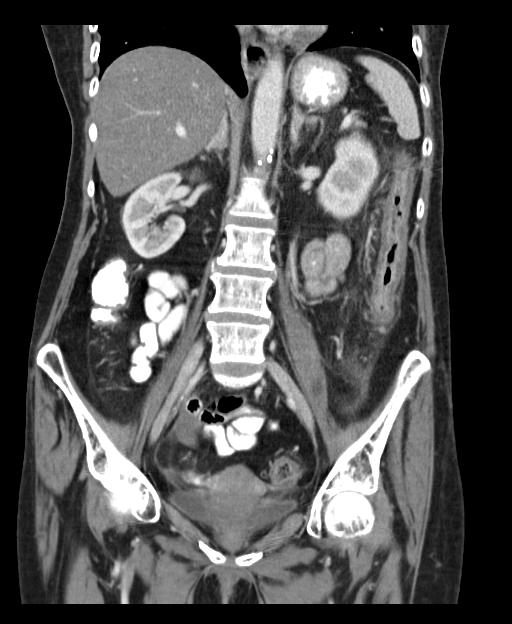
[im 48/86  soft-tissue]
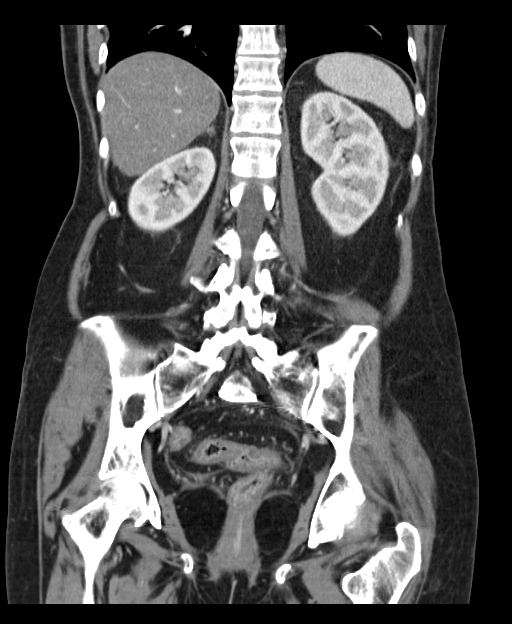

[16 of 46 positions shown; findings below may reference images not displayed]

FINDINGS: Lower chest and abdominal wall: Calcified lymph node in the right
cardiophrenic sulcus consistent with previous granulomatous disease.

Hepatobiliary: Marked hepatic steatosis. No focal lesion.No evidence
of biliary obstruction or stone.

Pancreas: Unremarkable.

Spleen: Unremarkable.

Adrenals/Urinary Tract: Negative adrenals. Punctate calculus in the
lower right kidney, best visualized on coronal imaging. No
hydronephrosis or ureteral calculus. Symmetric renal enhancement.
Unremarkable bladder.

Reproductive:No pathologic findings.

Stomach/Bowel: Essentially pancolonic wall thickening with mucosal
hyperenhancement and pericolonic edema. Few distal colonic
diverticula. These changes are progressed from prior. Negative small
bowel. No obstruction. No pericecal inflammation.

Vascular/Lymphatic: No acute vascular abnormality. No mass or
adenopathy.

Peritoneal: No ascites or pneumoperitoneum.

Musculoskeletal: No acute abnormalities.
IMPRESSION: 1. Pancolitis correlating with C difficile positivity.
2. Marked hepatic steatosis.

## 2015-09-07 MED ORDER — FERROUS GLUCONATE 324 (38 FE) MG PO TABS
324.0000 mg | ORAL_TABLET | Freq: Every day | ORAL | Status: DC
  Administered 2015-09-07 – 2015-09-10 (×4): 324 mg via ORAL
  Filled 2015-09-07 (×5): qty 1

## 2015-09-07 MED ORDER — SODIUM CHLORIDE 0.9 % IV BOLUS (SEPSIS)
1000.0000 mL | Freq: Once | INTRAVENOUS | Status: AC
  Administered 2015-09-07: 1000 mL via INTRAVENOUS

## 2015-09-07 MED ORDER — SODIUM CHLORIDE 0.9 % IV SOLN
1000.0000 mL | Freq: Once | INTRAVENOUS | Status: AC
  Administered 2015-09-07: 1000 mL via INTRAVENOUS

## 2015-09-07 MED ORDER — FERROUS GLUCONATE 240 (27 FE) MG PO TABS: 240.0000 mg | ORAL_TABLET | Freq: Every day | ORAL | Status: DC

## 2015-09-07 MED ORDER — THIAMINE HCL 100 MG/ML IJ SOLN: 100.0000 mg | Freq: Every day | INTRAMUSCULAR | Status: DC

## 2015-09-07 MED ORDER — IOHEXOL 300 MG/ML  SOLN
75.0000 mL | Freq: Once | INTRAMUSCULAR | Status: AC | PRN
  Administered 2015-09-07: 75 mL via INTRAVENOUS

## 2015-09-07 MED ORDER — ONDANSETRON HCL 4 MG/2ML IJ SOLN
4.0000 mg | Freq: Four times a day (QID) | INTRAMUSCULAR | Status: DC | PRN
  Administered 2015-09-07 – 2015-09-09 (×2): 4 mg via INTRAVENOUS
  Filled 2015-09-07 (×2): qty 2

## 2015-09-07 MED ORDER — FOLIC ACID 1 MG PO TABS
1.0000 mg | ORAL_TABLET | Freq: Every day | ORAL | Status: DC
  Administered 2015-09-07 – 2015-09-10 (×4): 1 mg via ORAL
  Filled 2015-09-07 (×4): qty 1

## 2015-09-07 MED ORDER — ENOXAPARIN SODIUM 30 MG/0.3ML ~~LOC~~ SOLN
30.0000 mg | SUBCUTANEOUS | Status: DC
  Administered 2015-09-07 – 2015-09-09 (×3): 30 mg via SUBCUTANEOUS
  Filled 2015-09-07 (×3): qty 0.3

## 2015-09-07 MED ORDER — ACETAMINOPHEN 650 MG RE SUPP: 650.0000 mg | Freq: Four times a day (QID) | RECTAL | Status: DC | PRN

## 2015-09-07 MED ORDER — POTASSIUM CHLORIDE CRYS ER 20 MEQ PO TBCR
40.0000 meq | EXTENDED_RELEASE_TABLET | Freq: Once | ORAL | Status: AC
  Administered 2015-09-07: 40 meq via ORAL
  Filled 2015-09-07: qty 2

## 2015-09-07 MED ORDER — LORAZEPAM 1 MG PO TABS: 1.0000 mg | ORAL_TABLET | Freq: Four times a day (QID) | ORAL | Status: DC | PRN

## 2015-09-07 MED ORDER — ONDANSETRON HCL 4 MG PO TABS: 4.0000 mg | ORAL_TABLET | Freq: Four times a day (QID) | ORAL | Status: DC | PRN

## 2015-09-07 MED ORDER — VANCOMYCIN 50 MG/ML ORAL SOLUTION
125.0000 mg | Freq: Four times a day (QID) | ORAL | Status: DC
  Administered 2015-09-07 – 2015-09-10 (×11): 125 mg via ORAL
  Filled 2015-09-07 (×14): qty 2.5

## 2015-09-07 MED ORDER — SODIUM CHLORIDE 0.9 % IV SOLN
1000.0000 mL | INTRAVENOUS | Status: DC
  Administered 2015-09-07: 1000 mL via INTRAVENOUS

## 2015-09-07 MED ORDER — METRONIDAZOLE IN NACL 5-0.79 MG/ML-% IV SOLN
500.0000 mg | Freq: Once | INTRAVENOUS | Status: AC
  Administered 2015-09-07: 500 mg via INTRAVENOUS
  Filled 2015-09-07: qty 100

## 2015-09-07 MED ORDER — POTASSIUM CHLORIDE CRYS ER 20 MEQ PO TBCR
40.0000 meq | EXTENDED_RELEASE_TABLET | ORAL | Status: AC
Start: 2015-09-07 — End: 2015-09-07
  Administered 2015-09-07: 40 meq via ORAL
  Filled 2015-09-07 (×2): qty 2

## 2015-09-07 MED ORDER — ACETAMINOPHEN 325 MG PO TABS
650.0000 mg | ORAL_TABLET | Freq: Four times a day (QID) | ORAL | Status: DC | PRN
  Administered 2015-09-07 – 2015-09-08 (×4): 650 mg via ORAL
  Filled 2015-09-07 (×4): qty 2

## 2015-09-07 MED ORDER — ADULT MULTIVITAMIN W/MINERALS CH
1.0000 | ORAL_TABLET | Freq: Every day | ORAL | Status: DC
  Administered 2015-09-07 – 2015-09-10 (×4): 1 via ORAL
  Filled 2015-09-07 (×4): qty 1

## 2015-09-07 MED ORDER — LORAZEPAM 2 MG/ML IJ SOLN: 1.0000 mg | Freq: Four times a day (QID) | INTRAMUSCULAR | Status: DC | PRN

## 2015-09-07 MED ORDER — VANCOMYCIN 50 MG/ML ORAL SOLUTION
125.0000 mg | Freq: Four times a day (QID) | ORAL | Status: DC
  Administered 2015-09-07: 125 mg via ORAL
  Filled 2015-09-07 (×4): qty 2.5

## 2015-09-07 MED ORDER — IOHEXOL 300 MG/ML  SOLN
25.0000 mL | Freq: Once | INTRAMUSCULAR | Status: DC | PRN
  Administered 2015-09-07: 25 mL via ORAL
  Filled 2015-09-07: qty 30

## 2015-09-07 MED ORDER — VITAMIN B-1 100 MG PO TABS
100.0000 mg | ORAL_TABLET | Freq: Every day | ORAL | Status: DC
  Administered 2015-09-07 – 2015-09-10 (×4): 100 mg via ORAL
  Filled 2015-09-07 (×4): qty 1

## 2015-09-07 MED ORDER — SODIUM CHLORIDE 0.9 % IV SOLN: INTRAVENOUS | Status: DC

## 2015-09-07 MED ORDER — SODIUM CHLORIDE 0.9 % IV SOLN
INTRAVENOUS | Status: AC
  Administered 2015-09-07: 17:00:00 via INTRAVENOUS

## 2015-09-07 NOTE — ED Notes (Signed)
Patient is aware that urine and stool samples are needed. Patient states she is unable to provide these samples at this time. Patient states she will tell staff when she is ready to attempt

## 2015-09-07 NOTE — ED Notes (Addendum)
Patient complaining of lower abdominal pain, lower back pain and diarrhea x7 days. Patient states she had been intermittently incontinent of bowel for a week. Patient denies injury to back.

## 2015-09-07 NOTE — ED Notes (Signed)
Urinalysis clean catch NOT catheterized.

## 2015-09-07 NOTE — ED Provider Notes (Signed)
CSN: [id number]     Arrival date & time 09/07/15  0830 History   First MD Initiated Contact with Patient 09/07/15 [phone]     Chief Complaint  Patient presents with  . Diarrhea  . Back Pain     (Consider location/radiation/quality/duration/timing/severity/associated sxs/prior Treatment) HPI Patient states she's had diarrhea for almost 7 days. She estimates 3-5 liquid bowel movements per day. She reports over the past 24 hours she has had incontinence of stool. She reports it is thin and mucousy but she has seen no blood. She reports constant sharp\achy lower abdominal pain. The quality and severity do not change significantly with bowel movement. She reports she's had some nausea but no significant amount of vomiting. She for she did try to take in a little bit of water but has not been unable to really eat. She has not documented a fever but she reports she's felt achy and chilled. She denies chronic or recurrent diarrhea. Her husband reports she tends to have constipation more often. She denies having taken any stool softeners or laxatives within the past several days. She does not think she has had recent antibiotics. Chart review indicates she was Cipro and Flagyl in September. She reports her lower back aches. She states as more or less chronic. She does not think that that has changed or is new since developing her diarrheal illness. Past Medical History  Diagnosis Date  . Allergy   . Anemia   . 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 Hypertension   . Hypothyroidism   . GERD (gastroesophageal reflux disease)   . Headache    Past Surgical History  Procedure Laterality Date  . Laparoscopic appendectomy    . Back surgery      x 2  . Colonoscopy N/A 03/13/2013    Procedure: COLONOSCOPY;  Surgeon: Leighton Ruff, MD;  Location: [facility];  Service: Endoscopy;  Laterality: N/A;   Family History  Problem Relation Age of Onset  . Stroke Father   . Stroke Brother   . Stroke Brother    Social  History  Substance Use Topics  . Smoking status: Current Every Day Smoker -- 0.50 packs/day    Types: Cigarettes  . Smokeless tobacco: None     Comment: 3 cigarettes per day  . Alcohol Use: 6.0 oz/week    10 Glasses of wine per week     Comment: occasional   OB History    No data available     Review of Systems  10 Systems reviewed and are negative for acute change except as noted in the HPI.   Allergies  Codeine; Flexeril; Sulfa antibiotics; and Synthroid  Home Medications   Prior to Admission medications   Medication Sig Start Date End Date Taking? Authorizing Provider  Cranberry 500 MG CHEW Chew 1 tablet by mouth 2 (two) times daily.   Yes Historical Provider, MD  docusate sodium (COLACE) 100 MG capsule Take 100 mg by mouth daily.   Yes Historical Provider, MD  ferrous gluconate (FERGON) 240 (27 FE) MG tablet Take 240 mg by mouth daily.   Yes Historical Provider, MD  ibuprofen (ADVIL,MOTRIN) 200 MG tablet Take 200-400 mg by mouth every 6 (six) hours as needed for moderate pain.   Yes Historical Provider, MD  Multiple Vitamin (MULTIVITAMIN WITH MINERALS) TABS Take 1 tablet by mouth daily. 01/29/13  Yes [name], MD  thiamine 100 MG tablet Take 1 tablet (100 mg total) by mouth daily. 04/27/15  Yes Janett Billow  U Vann, DO  folic acid (FOLVITE) 1 MG tablet Take 1 tablet (1 mg total) by mouth daily. Patient not taking: Reported on 09/07/2015 04/27/15   Tomi Bamberger Vann, DO   BP 113/67 mmHg  Pulse 108  Temp(Src) 99.2 F (37.3 C) (Oral)  Resp 18  Ht 5\' 2"  (1.575 m)  Wt 104 lb (47.174 kg)  BMI 19.02 kg/m2  SpO2 97% Physical Exam  Constitutional: She is oriented to person, place, and time.  Patient is pale and thin. She is alert and appropriate. She is nontoxic. She smells ketotic.  HENT:  Head: Normocephalic and atraumatic.  Nose: Nose normal.  Mucous membranes are moist. Tongue is pink and appears to some glossitis.  Eyes: EOM are normal. Pupils are equal, round, and  reactive to light.  Neck: Neck supple.  Cardiovascular: Normal rate, regular rhythm, normal heart sounds and intact distal pulses.   Pulmonary/Chest: Effort normal and breath sounds normal.  Abdominal: Soft. Bowel sounds are normal. She exhibits no distension. There is tenderness.  Patient endorses moderate tenderness to palpation throughout the lower abdomen. There is no guarding or rebound.  Musculoskeletal: Normal range of motion. She exhibits no edema or tenderness.  Neurological: She is alert and oriented to person, place, and time. She has normal strength. No cranial nerve deficit. She exhibits normal muscle tone. Coordination normal. GCS eye subscore is 4. GCS verbal subscore is 5. GCS motor subscore is 6.  Skin: Skin is warm, dry and intact. No rash noted.  Psychiatric: She has a normal mood and affect.    ED Course  Procedures (including critical care time) Labs Review Labs Reviewed  C DIFFICILE QUICK SCREEN W PCR REFLEX - Abnormal; Notable for the following:    C Diff antigen POSITIVE (*)    All other components within normal limits  COMPREHENSIVE METABOLIC PANEL - Abnormal; Notable for the following:    Potassium 3.0 (*)    Chloride 94 (*)    Calcium 7.1 (*)    Total Protein 6.3 (*)    Albumin 2.8 (*)    AST 52 (*)    Alkaline Phosphatase 246 (*)    All other components within normal limits  CBC WITH DIFFERENTIAL/PLATELET - Abnormal; Notable for the following:    WBC 19.3 (*)    RBC 2.70 (*)    Hemoglobin 9.0 (*)    HCT 26.8 (*)    Neutro Abs 17.4 (*)    Lymphs Abs 0.6 (*)    Monocytes Absolute 1.3 (*)    All other components within normal limits  LIPASE, BLOOD - Abnormal; Notable for the following:    Lipase 15 (*)    All other components within normal limits  URINALYSIS, ROUTINE W REFLEX MICROSCOPIC (NOT AT [facility]) - Abnormal; Notable for the following:    Ketones, ur 15 (*)    All other components within normal limits  GI PATHOGEN PANEL BY PCR, STOOL  I-STAT  CG4 LACTIC ACID, ED    Imaging Review Ct Abdomen Pelvis W Contrast  09/07/2015  CLINICAL DATA:  Leukocytosis and abdominal pain EXAM: CT ABDOMEN AND PELVIS WITH CONTRAST TECHNIQUE: Multidetector CT imaging of the abdomen and pelvis was performed using the standard protocol following bolus administration of intravenous contrast. CONTRAST:  70mL OMNIPAQUE IOHEXOL 300 MG/ML SOLN, 31mL OMNIPAQUE IOHEXOL 300 MG/ML SOLN COMPARISON:  07/23/2015 FINDINGS: Lower chest and abdominal wall: Calcified lymph node in the right cardiophrenic sulcus consistent with previous granulomatous disease. Hepatobiliary: Marked hepatic steatosis. No focal lesion.No  evidence of biliary obstruction or stone. Pancreas: Unremarkable. Spleen: Unremarkable. Adrenals/Urinary Tract: Negative adrenals. Punctate calculus in the lower right kidney, best visualized on coronal imaging. No hydronephrosis or ureteral calculus. Symmetric renal enhancement. Unremarkable bladder. Reproductive:No pathologic findings. Stomach/Bowel: Essentially pancolonic wall thickening with mucosal hyperenhancement and pericolonic edema. Few distal colonic diverticula. These changes are progressed from prior. Negative small bowel. No obstruction. No pericecal inflammation. Vascular/Lymphatic: No acute vascular abnormality. No mass or adenopathy. Peritoneal: No ascites or pneumoperitoneum. Musculoskeletal: No acute abnormalities. IMPRESSION: 1. Pancolitis correlating with C difficile positivity. 2. Marked hepatic steatosis. Electronically Signed   By: Monte Fantasia M.D.   On: 09/07/2015 14:00   I have personally reviewed and evaluated these images and lab results as part of my medical decision-making.   EKG Interpretation None      MDM   Final diagnoses:  C. difficile colitis   Patient presents with diarrheal illness and incontinence. She has had cramping lower abdominal pain and poor by mouth intake. Patient does test positive for C. difficile colitis.  She has leukocytosis and CAT scan shows diffuse colitis. Patient will be admitted for inpatient treatment. She is alert and appropriate. She does not have mental status change.    Charlesetta Shanks, MD 09/07/15 [phone]

## 2015-09-07 NOTE — ED Notes (Signed)
Pt attempt to provide urine and stool sample; insufficient sample. Will attempt at later time.

## 2015-09-07 NOTE — ED Notes (Signed)
Patient was able to provide small amount of stool for a specimen, Stool is a gelatin like consistency, clear in color with some yellow and brown. RN Lilia Pro aware

## 2015-09-07 NOTE — ED Notes (Signed)
Pt still unable to provide urine specimen. Stool specimen was collected.

## 2015-09-07 NOTE — H&P (Signed)
History and Physical  Holly Phelps VFI:[id number] DOB: [birth year] DOA: 09/07/2015  Referring physician: Thalia Phelps, ER physician PCP: Holly Portela, MD   Chief Complaint: Abdominal pain and diarrhea  HPI: Holly Phelps is a 54 y.o. female  With past medical history of cocaine abuse, alcohol abuse and recent recurrent episodes of colitis and urinary tract infections treated with antibiotics who presented to the emergency room on 10/19 after having diarrhea for the past 7 days, reporting 3-5 liquid bowel movements and then bowel incontinence for the last 24 hours. No blood reported. Patient has also complained of some lower quadrant abdominal pain. She had been taking Cipro and Flagyl in September for UTIs and colitis. In the emergency room, patient found to have a potassium level of 3, white count of 19.3, normal lactic acid level and a CT scan noting pan colitis. C. difficile PCR positive. Hospitalist called for further evaluation and admission. Patient started on by mouth vancomycin   Review of Systems:  Patient seen after arrival to floor. Pt complains of bilateral hand cramping and some finger numbness. Also complains of nausea and of course, diarrhea for the past 7 days  Pt denies any headaches, vision changes, dysphagia, chest pain, palpitations, shortness of breath, wheeze, cough, hematuria, dysuria, constipation, focal extremity weakness.  Review of systems are otherwise negative  Past Medical History  Diagnosis Date  . Allergy   . Anemia   . 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 Hypertension   . Hypothyroidism   . GERD (gastroesophageal reflux disease)   . Headache    Past Surgical History  Procedure Laterality Date  . Laparoscopic appendectomy    . Back surgery      x 2  . Colonoscopy N/A 03/13/2013    Procedure: COLONOSCOPY;  Surgeon: Leighton Ruff, MD;  Location: [facility];  Service: Endoscopy;  Laterality: N/A;   Social History:  reports that she has been  smoking Cigarettes.  She has been smoking about 0.50 packs per day. She does not have any smokeless tobacco history on file. She reports that she drinks about 6.0 oz of alcohol per week, last drink reported to be 3 days ago. She reports that she does not use illicit drugs. Patient lives at home with her husband & is able to participate in activities of daily living with out assistance  Allergies  Allergen Reactions  . Codeine Nausea And Vomiting  . Flexeril [Cyclobenzaprine] Itching  . Sulfa Antibiotics     Pt does not recall what the reaction was,been too long  . Synthroid [Levothyroxine] Rash    Family History  Problem Relation Age of Onset  . Stroke Father   . Stroke Brother   . Stroke Brother     Alert and oriented 3, no acute distressMedication Sig Start Date End Date Taking? Authorizing Provider  Cranberry 500 MG CHEW Chew 1 tablet by mouth 2 (two) times daily.   Yes Historical Provider, MD  docusate sodium (COLACE) 100 MG capsule Take 100 mg by mouth daily.   Yes Historical Provider, MD  ferrous gluconate (FERGON) 240 (27 FE) MG tablet Take 240 mg by mouth daily.   Yes Historical Provider, MD  ibuprofen (ADVIL,MOTRIN) 200 MG tablet Take 200-400 mg by mouth every 6 (six) hours as needed for moderate pain.   Yes Historical Provider, MD  Multiple Vitamin (MULTIVITAMIN WITH MINERALS) TABS Take 1 tablet by mouth daily. 01/29/13  Yes [name], MD  thiamine 100 MG tablet Take  1 tablet (100 mg total) by mouth daily. 04/27/15  Yes [name], DO  folic acid (FOLVITE) 1 MG tablet Take 1 tablet (1 mg total) by mouth daily. Patient not taking: Reported on 09/07/2015 04/27/15   [name], DO    Physical Exam: BP 133/91 mmHg  Pulse 109  Temp(Src) 98.8 F (37.1 C) (Oral)  Resp 20  Ht 5\' 2"  (1.575 m)  Wt 44.2 kg (97 lb 7.1 oz)  BMI 17.82 kg/m2  SpO2 100%  General:   alert and oriented 3, no acute distress  Eyes: sclera nonicteric, extraocular movements are intact ENT:  normocephalic, atraumatic, mucous membranes slightly dry  Neck: No carotid bruits  Cardiovascular: Regular rhythm, borderline tachycardia  Respiratory: clear to auscultation bilaterally Abdomen: soft, mild distention, tenderness in the lower quadrants, hyperactive bowel sounds  Skin: No skin breaks, tears or lesions  Musculoskeletal: no clubbing or cyanosis or edema  Psychiatric: Patient is appropriate, no evidence of psychoses Neurologic: no focal deficits, numbness is subjective mostly in the fingertips           Labs on Admission:  Basic Metabolic Panel:  Recent Labs Lab 09/07/15 0952  NA 136  K 3.0*  CL 94*  CO2 27  GLUCOSE 88  BUN 8  CREATININE 0.45  CALCIUM 7.1*   Liver Function Tests:  Recent Labs Lab 09/07/15 0952  AST 52*  ALT 16  ALKPHOS 246*  BILITOT 1.2  PROT 6.3*  ALBUMIN 2.8*    Recent Labs Lab 09/07/15 0952  LIPASE 15*   No results for input(s): AMMONIA in the last 168 hours. CBC:  Recent Labs Lab 09/07/15 0952  WBC 19.3*  NEUTROABS 17.4*  HGB 9.0*  HCT 26.8*  MCV 99.3  PLT 334   Cardiac Enzymes: No results for input(s): CKTOTAL, CKMB, CKMBINDEX, TROPONINI in the last 168 hours.  BNP (last 3 results) No results for input(s): BNP in the last 8760 hours.  ProBNP (last 3 results) No results for input(s): PROBNP in the last 8760 hours.  CBG: No results for input(s): GLUCAP in the last 168 hours.  Radiological Exams on Admission: Ct Abdomen Pelvis W Contrast  09/07/2015  CLINICAL DATA:  Leukocytosis and abdominal pain EXAM: CT ABDOMEN AND PELVIS WITH CONTRAST TECHNIQUE: Multidetector CT imaging of the abdomen and pelvis was performed using the standard protocol following bolus administration of intravenous contrast. CONTRAST:  65mL OMNIPAQUE IOHEXOL 300 MG/ML SOLN, 64mL OMNIPAQUE IOHEXOL 300 MG/ML SOLN COMPARISON:  07/23/2015 FINDINGS: Lower chest and abdominal wall: Calcified lymph node in the right cardiophrenic sulcus consistent  with previous granulomatous disease. Hepatobiliary: Marked hepatic steatosis. No focal lesion.No evidence of biliary obstruction or stone. Pancreas: Unremarkable. Spleen: Unremarkable. Adrenals/Urinary Tract: Negative adrenals. Punctate calculus in the lower right kidney, best visualized on coronal imaging. No hydronephrosis or ureteral calculus. Symmetric renal enhancement. Unremarkable bladder. Reproductive:No pathologic findings. Stomach/Bowel: Essentially pancolonic wall thickening with mucosal hyperenhancement and pericolonic edema. Few distal colonic diverticula. These changes are progressed from prior. Negative small bowel. No obstruction. No pericecal inflammation. Vascular/Lymphatic: No acute vascular abnormality. No mass or adenopathy. Peritoneal: No ascites or pneumoperitoneum. Musculoskeletal: No acute abnormalities. IMPRESSION: 1. Pancolitis correlating with C difficile positivity. 2. Marked hepatic steatosis. Electronically Signed   By: Monte Fantasia M.D.   On: 09/07/2015 14:00    EKG: Independently reviewed. sinus tachycardia, borderline prolonged QT of 507   Assessment/Plan Present on Admission:  . Alcohol abuse: CIWA protocol. Patient states last drink was 3 days ago, but we'll  still monitor closely.  . Cocaine abuse: Counseled  . C. difficile colitis: Pancolitis. Given treatments with Flagyl for generalized colitis recently, we'll go with 14 days of 4 times a day by mouth vancomycin. No PPI. Enteric precautions. Leukocytosis secondary to infection. No signs of sepsis.   . Hypokalemia causing hand cramping and finger numbness: Secondary to he hydration from diarrhea. Replacing potassium  . Tobacco use disorder: Patient states she only smokes a few cigarettes a day. Declines nicotine patch   Consultants: none   Code Status: full code, confirmed with patient. She states that she's never thought about it before   Family Communication: patient declined for me to call her husband     Disposition Plan: likely will be here for several days until patient able to take by mouth, diarrhea stopped and leukocytosis resolved   Time spent: 40 minutes   [facility] Pager [phone]

## 2015-09-07 NOTE — ED Notes (Signed)
Pt transported to CT at present time; will administer flagyl/vancomycin with pt return.

## 2015-09-08 LAB — BASIC METABOLIC PANEL
Anion gap: 14 (ref 5–15)
BUN: <5 mg/dL — ABNORMAL LOW (ref 6–20)
CO2: 20 mmol/L — ABNORMAL LOW (ref 22–32)
Calcium: 6 mg/dL — CL (ref 8.9–10.3)
Chloride: 102 mmol/L (ref 101–111)
Creatinine, Ser: 0.48 mg/dL (ref 0.44–1.00)
GFR calc Af Amer: >60 mL/min (ref >60)
GFR calc non Af Amer: >60 mL/min (ref >60)
Glucose, Bld: 67 mg/dL (ref 65–99)
Potassium: 2.6 mmol/L — CL (ref 3.5–5.1)
Sodium: 136 mmol/L (ref 135–145)

## 2015-09-08 LAB — CBC
HCT: 25.4 % — ABNORMAL LOW (ref 36.0–46.0)
Hemoglobin: 8.1 g/dL — ABNORMAL LOW (ref 12.0–15.0)
MCH: 32.3 pg (ref 26.0–34.0)
MCHC: 31.9 g/dL (ref 30.0–36.0)
MCV: 101.2 fL — ABNORMAL HIGH (ref 78.0–100.0)
Platelets: 290 10*3/uL (ref 150–400)
RBC: 2.51 MIL/uL — ABNORMAL LOW (ref 3.87–5.11)
RDW: 14.8 % (ref 11.5–15.5)
WBC: 13.2 10*3/uL — ABNORMAL HIGH (ref 4.0–10.5)

## 2015-09-08 LAB — MAGNESIUM: Magnesium: 0.7 mg/dL — CL (ref 1.7–2.4)

## 2015-09-08 MED ORDER — SODIUM CHLORIDE 0.9 % IV SOLN
1.0000 g | Freq: Once | INTRAVENOUS | Status: AC
  Administered 2015-09-08: 1 g via INTRAVENOUS
  Filled 2015-09-08: qty 10

## 2015-09-08 MED ORDER — POTASSIUM CHLORIDE IN NACL 40-0.9 MEQ/L-% IV SOLN
INTRAVENOUS | Status: DC
  Administered 2015-09-08 – 2015-09-09 (×2): 75 mL/h via INTRAVENOUS
  Filled 2015-09-08 (×3): qty 1000

## 2015-09-08 MED ORDER — ENSURE ENLIVE PO LIQD: 237.0000 mL | Freq: Two times a day (BID) | ORAL | Status: DC | PRN

## 2015-09-08 MED ORDER — POTASSIUM CHLORIDE CRYS ER 20 MEQ PO TBCR
40.0000 meq | EXTENDED_RELEASE_TABLET | Freq: Three times a day (TID) | ORAL | Status: AC
  Administered 2015-09-08 (×2): 40 meq via ORAL
  Filled 2015-09-08 (×2): qty 2

## 2015-09-08 MED ORDER — POTASSIUM CHLORIDE 10 MEQ/100ML IV SOLN
10.0000 meq | INTRAVENOUS | Status: DC
  Administered 2015-09-08: 10 meq via INTRAVENOUS
  Filled 2015-09-08 (×5): qty 100

## 2015-09-08 MED ORDER — MAGNESIUM SULFATE 4 GM/100ML IV SOLN
4.0000 g | Freq: Once | INTRAVENOUS | Status: AC
  Administered 2015-09-08: 4 g via INTRAVENOUS
  Filled 2015-09-08: qty 100

## 2015-09-08 NOTE — Progress Notes (Signed)
Initial Nutrition Assessment  DOCUMENTATION CODES:   Severe malnutrition in context of chronic illness, Underweight  INTERVENTION:   Patient declines nutritional supplements but d/t severe malnutrition status will order Ensure supplements PRN.  Recommend monitor magnesium, potassium, and phosphorus daily for at least 3 days, MD to replete as needed, as pt is at risk for refeeding syndrome given labs and severe malnutrition status.  Encourage PO intake (soft foods) RD to continue to monitor for needs  NUTRITION DIAGNOSIS:   Malnutrition related to chronic illness as evidenced by percent weight loss, severe depletion of body fat, severe depletion of muscle mass.  GOAL:   Patient will meet greater than or equal to 90% of their needs  MONITOR:   PO intake, Labs, Weight trends, Skin, I & O's  REASON FOR ASSESSMENT:   Consult Assessment of nutrition requirement/status  ASSESSMENT:   54 y.o. female With past medical history of cocaine abuse, alcohol abuse and recent recurrent episodes of colitis and urinary tract infections treated with antibiotics who presented to the emergency room on 10/19 after having diarrhea for the past 7 days, reporting 3-5 liquid bowel movements and then bowel incontinence for the last 24 hours.  Pt reports poor intake for 1 week d/t N/V. Pt's appetite seems to have improved today, pt could not recall what she had for breakfast but states she ordered a chef's salad for lunch. Encouraged pt to consume soft foods to help bulk stool and to focus on protein foods.   PO intake: 50%. Pt declines nutritional supplements but RD to order PRN given severe malnutrition status. Suspect poor quality diet PTA d/t N/V/D plus history of ETOH abuse.  Per weight history, pt has lost 11 lb since May 2016 (10% weight loss x 6 months, significant for time frame).  Nutrition-Focused physical exam completed. Findings are severe fat depletion, severe muscle depletion, and no  edema.   Labs reviewed: Very low K, Mg Low BUN  Diet Order:  Diet regular Room service appropriate?: Yes; Fluid consistency:: Thin  Skin:  Reviewed, no issues  Last BM:  10/20  Height:   Ht Readings from Last 1 Encounters:  09/07/15 5\' 2"  (1.575 m)    Weight:   Wt Readings from Last 1 Encounters:  09/08/15 98 lb 1.7 oz (44.5 kg)    Ideal Body Weight:  50 kg  BMI:  Body mass index is 17.94 kg/(m^2).  Estimated Nutritional Needs:   Kcal:  1400-1600  Protein:  65-75g  Fluid:  1.5L/day  EDUCATION NEEDS:   Education needs addressed  Clayton Bibles, MS, RD, LDN Pager: [phone] After Hours Pager: [phone]

## 2015-09-08 NOTE — Progress Notes (Signed)
PROGRESS NOTE  EXA BOMBA HTD:[id number] DOB: [birth year] DOA: 09/07/2015 PCP: Kennon Portela, MD  HPI/Recap of past 33 hours: 54 year old female with past medical history of alcohol and cocaine abuse admitted on 10/19 for 1 week history of diarrhea and abdominal pain and found to have C. difficile colitis. Patient started on by mouth vancomycin and admitted to the hospitalist service  Today, patient tired, but feeling a little better. White blood cell count improving. Potassium and magnesium required replacement. Diarrhea starting to slow down. Although her potassium level was lower this morning, she says her hand cramping is better  Assessment/Plan: Principal Problem:   C. difficile colitis: Improving with by mouth vancomycin 2 weeks. No Imodium. Active Problems:   Alcohol abuse: Patient states last drink was 3 days prior to admission. Monitoring for withdrawal, but I believe we are out of range    Cocaine abuse: Patient states she's not done any cocaine for several weeks. Counseled    Hypokalemia: Replaced   Tobacco use disorder: Counseled. Patient declined a nicotine patch.   Hypomagnesemia: Magnesium level checked and potassium level low. Also replaced   Code Status: Full code  Family Communication: Left message with husband  Disposition Plan: Anticipate discharge in next 1-2 days, once white blood cell count normal and diarrhea much improved   Consultants:  None  Procedures:  None  Antibiotics:  Vancomycin 4 times a day by mouth day 2/14   Objective: BP 119/71 mmHg  Pulse 96  Temp(Src) 98.6 F (37 C) (Oral)  Resp 18  Ht 5\' 2"  (1.575 m)  Wt 44.5 kg (98 lb 1.7 oz)  BMI 17.94 kg/m2  SpO2 100%  Intake/Output Summary (Last 24 hours) at 09/08/15 1510 Last data filed at 09/08/15 1200  Gross per 24 hour  Intake    480 ml  Output      0 ml  Net    480 ml   Filed Weights   09/07/15 0852 09/07/15 1554 09/08/15 0513  Weight: 47.174 kg (104 lb)  44.2 kg (97 lb 7.1 oz) 44.5 kg (98 lb 1.7 oz)    Exam:   General:  Alert and oriented 3, fatigued, no acute distress  Cardiovascular: Regular rate and rhythm, S1-S2  Respiratory: Clear to auscultation bilaterally  Abdomen: Soft, nontender, nondistended, hyperactive bowel sounds  Musculoskeletal: No clubbing or cyanosis or edema   Data Reviewed: Basic Metabolic Panel:  Recent Labs Lab 09/07/15 0952 09/08/15 0440  NA 136 136  K 3.0* 2.6*  CL 94* 102  CO2 27 20*  GLUCOSE 88 67  BUN 8 <5*  CREATININE 0.45 0.48  CALCIUM 7.1* 6.0*  MG  --  0.7*   Liver Function Tests:  Recent Labs Lab 09/07/15 0952  AST 52*  ALT 16  ALKPHOS 246*  BILITOT 1.2  PROT 6.3*  ALBUMIN 2.8*    Recent Labs Lab 09/07/15 0952  LIPASE 15*   No results for input(s): AMMONIA in the last 168 hours. CBC:  Recent Labs Lab 09/07/15 0952 09/08/15 0440  WBC 19.3* 13.2*  NEUTROABS 17.4*  --   HGB 9.0* 8.1*  HCT 26.8* 25.4*  MCV 99.3 101.2*  PLT 334 290   Cardiac Enzymes:   No results for input(s): CKTOTAL, CKMB, CKMBINDEX, TROPONINI in the last 168 hours. BNP (last 3 results) No results for input(s): BNP in the last 8760 hours.  ProBNP (last 3 results) No results for input(s): PROBNP in the last 8760 hours.  CBG: No results for input(s): GLUCAP  in the last 168 hours.  Recent Results (from the past 240 hour(s))  C difficile quick scan w PCR reflex     Status: Abnormal   Collection Time: 09/07/15 12:00 PM  Result Value Ref Range Status   C Diff antigen POSITIVE (A) NEGATIVE Final   C Diff toxin NEGATIVE NEGATIVE Final   C Diff interpretation   Final    C. difficile present, but toxin not detected. This indicates colonization. In most cases, this does not require treatment. If patient has signs and symptoms consistent with colitis, consider treatment. Requires ENTERIC precautions.     Studies: No results found.  Scheduled Meds: . enoxaparin (LOVENOX) injection  30 mg  Subcutaneous Q24H  . ferrous gluconate  324 mg Oral Q breakfast  . folic acid  1 mg Oral Daily  . multivitamin with minerals  1 tablet Oral Daily  . potassium chloride  40 mEq Oral TID  . thiamine  100 mg Oral Daily   Or  . thiamine  100 mg Intravenous Daily  . vancomycin  125 mg Oral QID    Continuous Infusions: . 0.9 % NaCl with KCl 40 mEq / L 75 mL/hr (09/08/15 1024)     Time spent: 25 minutes  [facility] Pager [phone]. If 7PM-7AM, please contact night-coverage at [email or web address], password [facility] 09/08/2015, 3:10 PM  LOS: 1 day

## 2015-09-08 NOTE — Progress Notes (Addendum)
CRITICAL VALUE ALERT  Critical value received:  Potassium 2.6, Calcium 6.0  Date of notification:  09/08/15  Time of notification:  3338  Critical value read back:Yes.    Nurse who received alert:  Hortencia Conradi RN   MD notified (1st page):  Lamar Blinks NP  Time of first page:  0542  MD notified (2nd page):  Time of second page:  Responding MD:  Lamar Blinks NP  Time MD responded: [phone]

## 2015-09-09 LAB — BASIC METABOLIC PANEL
Anion gap: 14 (ref 5–15)
BUN: <5 mg/dL — ABNORMAL LOW (ref 6–20)
CO2: 18 mmol/L — ABNORMAL LOW (ref 22–32)
Calcium: 6.5 mg/dL — ABNORMAL LOW (ref 8.9–10.3)
Chloride: 105 mmol/L (ref 101–111)
Creatinine, Ser: 0.35 mg/dL — ABNORMAL LOW (ref 0.44–1.00)
GFR calc Af Amer: >60 mL/min (ref >60)
GFR calc non Af Amer: >60 mL/min (ref >60)
Glucose, Bld: 70 mg/dL (ref 65–99)
Potassium: 4.2 mmol/L (ref 3.5–5.1)
Sodium: 137 mmol/L (ref 135–145)

## 2015-09-09 LAB — GI PATHOGEN PANEL BY PCR, STOOL
Campylobacter by PCR: NOT DETECTED
Cryptosporidium by PCR: NOT DETECTED
E coli (ETEC) LT/ST: NOT DETECTED
E coli (STEC): NOT DETECTED
E coli 0157 by PCR: NOT DETECTED
G lamblia by PCR: NOT DETECTED
Norovirus GI/GII: NOT DETECTED
Rotavirus A by PCR: NOT DETECTED
Salmonella by PCR: NOT DETECTED
Shigella by PCR: NOT DETECTED

## 2015-09-09 LAB — CBC
HCT: 27.4 % — ABNORMAL LOW (ref 36.0–46.0)
Hemoglobin: 8.9 g/dL — ABNORMAL LOW (ref 12.0–15.0)
MCH: 33 pg (ref 26.0–34.0)
MCHC: 32.5 g/dL (ref 30.0–36.0)
MCV: 101.5 fL — ABNORMAL HIGH (ref 78.0–100.0)
Platelets: 298 10*3/uL (ref 150–400)
RBC: 2.7 MIL/uL — ABNORMAL LOW (ref 3.87–5.11)
RDW: 14.7 % (ref 11.5–15.5)
WBC: 7.3 10*3/uL (ref 4.0–10.5)

## 2015-09-09 LAB — MAGNESIUM: Magnesium: 1.3 mg/dL — ABNORMAL LOW (ref 1.7–2.4)

## 2015-09-09 MED ORDER — MAGNESIUM SULFATE 2 GM/50ML IV SOLN
2.0000 g | Freq: Once | INTRAVENOUS | Status: AC
  Administered 2015-09-09: 2 g via INTRAVENOUS
  Filled 2015-09-09: qty 50

## 2015-09-09 MED ORDER — POTASSIUM CHLORIDE CRYS ER 20 MEQ PO TBCR
20.0000 meq | EXTENDED_RELEASE_TABLET | Freq: Two times a day (BID) | ORAL | Status: AC
  Administered 2015-09-09 (×2): 20 meq via ORAL
  Filled 2015-09-09 (×2): qty 1

## 2015-09-09 MED ORDER — SODIUM CHLORIDE 0.9 % IV SOLN
INTRAVENOUS | Status: DC
  Administered 2015-09-09: 11:00:00 via INTRAVENOUS
  Administered 2015-09-10: 1000 mL via INTRAVENOUS

## 2015-09-09 NOTE — Progress Notes (Addendum)
PROGRESS NOTE  Holly Phelps:[id number] DOB: September 18, 1961 DOA: 09/07/2015 PCP: Kennon Portela, MD  HPI/Recap of past 77 hours: 54 year old female with past medical history of alcohol and cocaine abuse admitted on 10/19 for 1 week history of diarrhea and abdominal pain and found to have C. difficile colitis. Patient started on by mouth vancomycin and admitted to the hospitalist service  She is feeling better, wants to go home. Has had 5 BM this am, small amount , less watery/ no significant abdominal pain.   Assessment/Plan: Principal Problem:   Metabolic acidosis: In setting of diarrhea. Will increase IV fluids.     C. difficile colitis: Improving with by mouth vancomycin 2 weeks. No Imodium.    WBC trending down. Still with multiples BM. Continue with oral vancomycin.      Hypocalcemia: corrected by albumin: 7.4. Start calcium supplement     Alcohol abuse: Patient states last drink was 3 days prior to admission. Monitoring for withdrawal, but I believe we are out of range    Cocaine abuse: Patient states she's not done any cocaine for several weeks. Counseled    Hypokalemia: Replaced. Replete mg.    Tobacco use disorder: Counseled. Patient declined a nicotine patch.   Hypomagnesemia: replaced IV. Repeat Labs in am/    Code Status: Full code  Family Communication: care discussed with patient.   Disposition Plan: Anticipate discharge in next 1-2 days, once white blood cell count normal and diarrhea much improved   Consultants:  None  Procedures:  None  Antibiotics:  Vancomycin 4 times a day by mouth day 2/14   Objective: BP 127/90 mmHg  Pulse 88  Temp(Src) 98.1 F (36.7 C) (Oral)  Resp 20  Ht 5\' 2"  (1.575 m)  Wt 44.5 kg (98 lb 1.7 oz)  BMI 17.94 kg/m2  SpO2 100%  Intake/Output Summary (Last 24 hours) at 09/09/15 1003 Last data filed at 09/09/15 0817  Gross per 24 hour  Intake 1943.75 ml  Output    600 ml  Net 1343.75 ml   Filed Weights    09/07/15 0852 09/07/15 1554 09/08/15 0513  Weight: 47.174 kg (104 lb) 44.2 kg (97 lb 7.1 oz) 44.5 kg (98 lb 1.7 oz)    Exam:   General:  Alert and oriented 3, NAD  Cardiovascular: Regular rate and rhythm, S1-S2  Respiratory: Clear to auscultation bilaterally  Abdomen: Soft, nontender, nondistended, hyperactive bowel sounds  Musculoskeletal: No clubbing or cyanosis or edema   Data Reviewed: Basic Metabolic Panel:  Recent Labs Lab 09/07/15 0952 09/08/15 0440 09/09/15 0420  NA 136 136 137  K 3.0* 2.6* 4.2  CL 94* 102 105  CO2 27 20* 18*  GLUCOSE 88 67 70  BUN 8 <5* <5*  CREATININE 0.45 0.48 0.35*  CALCIUM 7.1* 6.0* 6.5*  MG  --  0.7* 1.3*   Liver Function Tests:  Recent Labs Lab 09/07/15 0952  AST 52*  ALT 16  ALKPHOS 246*  BILITOT 1.2  PROT 6.3*  ALBUMIN 2.8*    Recent Labs Lab 09/07/15 0952  LIPASE 15*   No results for input(s): AMMONIA in the last 168 hours. CBC:  Recent Labs Lab 09/07/15 0952 09/08/15 0440 09/09/15 0420  WBC 19.3* 13.2* 7.3  NEUTROABS 17.4*  --   --   HGB 9.0* 8.1* 8.9*  HCT 26.8* 25.4* 27.4*  MCV 99.3 101.2* 101.5*  PLT 334 290 298   Cardiac Enzymes:   No results for input(s): CKTOTAL, CKMB, CKMBINDEX, TROPONINI in the  last 168 hours. BNP (last 3 results) No results for input(s): BNP in the last 8760 hours.  ProBNP (last 3 results) No results for input(s): PROBNP in the last 8760 hours.  CBG: No results for input(s): GLUCAP in the last 168 hours.  Recent Results (from the past 240 hour(s))  C difficile quick scan w PCR reflex     Status: Abnormal   Collection Time: 09/07/15 12:00 PM  Result Value Ref Range Status   C Diff antigen POSITIVE (A) NEGATIVE Final   C Diff toxin NEGATIVE NEGATIVE Final   C Diff interpretation   Final    C. difficile present, but toxin not detected. This indicates colonization. In most cases, this does not require treatment. If patient has signs and symptoms consistent with colitis,  consider treatment. Requires ENTERIC precautions.     Studies: No results found.  Scheduled Meds: . enoxaparin (LOVENOX) injection  30 mg Subcutaneous Q24H  . ferrous gluconate  324 mg Oral Q breakfast  . folic acid  1 mg Oral Daily  . magnesium sulfate 1 - 4 g bolus IVPB  2 g Intravenous Once  . multivitamin with minerals  1 tablet Oral Daily  . thiamine  100 mg Oral Daily   Or  . thiamine  100 mg Intravenous Daily  . vancomycin  125 mg Oral QID    Continuous Infusions: . sodium chloride       Time spent: 25 minutes  Vassie Kugel, [facility] Pager [phone]. If 7PM-7AM, please contact night-coverage at [email or web address], password [facility] 09/09/2015, 10:03 AM  LOS: 2 days

## 2015-09-09 NOTE — Progress Notes (Signed)
Mg+ 1.3 today. Paged NP on call. Orders received for 2 g mg so4 IV.

## 2015-09-09 NOTE — Progress Notes (Signed)
Pt sat up in chair for a good portion of the day. Pt ambulating with supervision in room. Pt requested to walk the hall and then changed her mind. Still having small amounts of loose stools

## 2015-09-10 LAB — BASIC METABOLIC PANEL
Anion gap: 8 (ref 5–15)
BUN: <5 mg/dL — ABNORMAL LOW (ref 6–20)
CO2: 20 mmol/L — ABNORMAL LOW (ref 22–32)
Calcium: 6.8 mg/dL — ABNORMAL LOW (ref 8.9–10.3)
Chloride: 106 mmol/L (ref 101–111)
Creatinine, Ser: 0.38 mg/dL — ABNORMAL LOW (ref 0.44–1.00)
GFR calc Af Amer: >60 mL/min (ref >60)
GFR calc non Af Amer: >60 mL/min (ref >60)
Glucose, Bld: 90 mg/dL (ref 65–99)
Potassium: 4.3 mmol/L (ref 3.5–5.1)
Sodium: 134 mmol/L — ABNORMAL LOW (ref 135–145)

## 2015-09-10 LAB — MAGNESIUM: Magnesium: 1.3 mg/dL — ABNORMAL LOW (ref 1.7–2.4)

## 2015-09-10 MED ORDER — VANCOMYCIN 50 MG/ML ORAL SOLUTION: 125.0000 mg | Freq: Four times a day (QID) | ORAL | Status: AC

## 2015-09-10 NOTE — Discharge Summary (Signed)
Discharge Summary  Holly Phelps JJO:[id number] DOB: 11-02-1961  PCP: Kennon Portela, MD  Admit date: 09/07/2015 Discharge date: 09/10/2015  Time spent: 25 minutes  Recommendations for Outpatient Follow-up:  1. New medication: Vancomycin 125 mg by mouth 4 times a day x 10 more days 2. Patient will follow up with her PCP in the next month   Discharge Diagnoses:  Active Hospital Problems   Diagnosis Date Noted  . C. difficile colitis 09/07/2015  . Hypomagnesemia 09/08/2015  . Clostridium difficile colitis 09/07/2015  . Hypokalemia 09/07/2015  . Tobacco use disorder 09/07/2015  . Cocaine abuse 05/27/2015  . Alcohol abuse 03/01/2008    Resolved Hospital Problems   Diagnosis Date Noted Date Resolved  No resolved problems to display.    Discharge Condition: Improved, being discharged home  Diet recommendation: Regular diet  Filed Weights   09/07/15 0852 09/07/15 1554 09/08/15 0513  Weight: 47.174 kg (104 lb) 44.2 kg (97 lb 7.1 oz) 44.5 kg (98 lb 1.7 oz)    History of present illness:  54 year old female with past medical history of alcohol and cocaine abuse admitted on 10/19 for 1 week history of diarrhea and abdominal pain and found to have C. difficile colitis. Patient started on by mouth vancomycin and admitted to the hospitalist service  Hospital Course:  Principal Problem:   C. difficile colitis colon tissue responded well. Over the next few days, white blood cell count normalized. Diarrhea continued to improve to the point by day of discharge, minimal. Patient advised to continue taking by mouth vancomycin 4 times a day for 10 days for total of 2 weeks. Advised to avoid PPIs and Imodium. Also advised that even if she completed full course there does remain chance that infection could return. Active Problems:   Alcohol abuse: Counseled. Patient stated that her last drink was 3 days prior to admission. She was monitored for withdrawal, but this did not occur  Cocaine abuse: Counseled. Patient continues to intermittently do this    Hypokalemia: Secondary to GI losses, replaced   Tobacco use disorder: Stable, patient declined nicotine patch   Hypomagnesemia: Secondary to GI losses, replaced   Procedures:  None  Consultations:  None  Discharge Exam: BP 119/86 mmHg  Pulse 94  Temp(Src) 98.2 F (36.8 C) (Oral)  Resp 20  Ht 5\' 2"  (1.575 m)  Wt 44.5 kg (98 lb 1.7 oz)  BMI 17.94 kg/m2  SpO2 100%  General: Alert & oriented x 3 Cardiovascular: RRR S1S2 Respiratory: CTA bilaterally  Discharge Instructions You were cared for by a hospitalist during your hospital stay. If you have any questions about your discharge medications or the care you received while you were in the hospital after you are discharged, you can call the unit and asked to speak with the hospitalist on call if the hospitalist that took care of you is not available. Once you are discharged, your primary care physician will handle any further medical issues. Please note that NO REFILLS for any discharge medications will be authorized once you are discharged, as it is imperative that you return to your primary care physician (or establish a relationship with a primary care physician if you do not have one) for your aftercare needs so that they can reassess your need for medications and monitor your lab values.  Discharge Instructions    Diet - low sodium heart healthy    Complete by:  As directed      Increase activity slowly    Complete  by:  As directed             Medication List    STOP taking these medications        folic acid 1 MG tablet  Commonly known as:  FOLVITE      TAKE these medications        Cranberry 500 MG Chew  Chew 1 tablet by mouth 2 (two) times daily.     docusate sodium 100 MG capsule  Commonly known as:  COLACE  Take 100 mg by mouth daily.     ferrous gluconate 240 (27 FE) MG tablet  Commonly known as:  FERGON  Take 240 mg by mouth  daily.     ibuprofen 200 MG tablet  Commonly known as:  ADVIL,MOTRIN  Take 200-400 mg by mouth every 6 (six) hours as needed for moderate pain.     multivitamin with minerals Tabs tablet  Take 1 tablet by mouth daily.     thiamine 100 MG tablet  Take 1 tablet (100 mg total) by mouth daily.     vancomycin 50 mg/mL oral solution  Commonly known as:  VANCOCIN  Take 2.5 mLs (125 mg total) by mouth 4 (four) times daily.       Allergies  Allergen Reactions  . Codeine Nausea And Vomiting  . Flexeril [Cyclobenzaprine] Itching  . Sulfa Antibiotics     Pt does not recall what the reaction was,been too long  . Synthroid [Levothyroxine] Rash       Follow-up Information    Follow up with GUEST, Veneda Melter, MD In 1 month.   Specialty:  Internal Medicine   Why:  As needed   Contact information:   [place] [address] [phone]        The results of significant diagnostics from this hospitalization (including imaging, microbiology, ancillary and laboratory) are listed below for reference.    Significant Diagnostic Studies: Ct Abdomen Pelvis W Contrast  09/07/2015  CLINICAL DATA:  Leukocytosis and abdominal pain EXAM: CT ABDOMEN AND PELVIS WITH CONTRAST TECHNIQUE: Multidetector CT imaging of the abdomen and pelvis was performed using the standard protocol following bolus administration of intravenous contrast. CONTRAST:  35mL OMNIPAQUE IOHEXOL 300 MG/ML SOLN, 50mL OMNIPAQUE IOHEXOL 300 MG/ML SOLN COMPARISON:  07/23/2015 FINDINGS: Lower chest and abdominal wall: Calcified lymph node in the right cardiophrenic sulcus consistent with previous granulomatous disease. Hepatobiliary: Marked hepatic steatosis. No focal lesion.No evidence of biliary obstruction or stone. Pancreas: Unremarkable. Spleen: Unremarkable. Adrenals/Urinary Tract: Negative adrenals. Punctate calculus in the lower right kidney, best visualized on coronal imaging. No hydronephrosis or ureteral  calculus. Symmetric renal enhancement. Unremarkable bladder. Reproductive:No pathologic findings. Stomach/Bowel: Essentially pancolonic wall thickening with mucosal hyperenhancement and pericolonic edema. Few distal colonic diverticula. These changes are progressed from prior. Negative small bowel. No obstruction. No pericecal inflammation. Vascular/Lymphatic: No acute vascular abnormality. No mass or adenopathy. Peritoneal: No ascites or pneumoperitoneum. Musculoskeletal: No acute abnormalities. IMPRESSION: 1. Pancolitis correlating with C difficile positivity. 2. Marked hepatic steatosis. Electronically Signed   By: Monte Fantasia M.D.   On: 09/07/2015 14:00    Microbiology: Recent Results (from the past 240 hour(s))  C difficile quick scan w PCR reflex     Status: Abnormal   Collection Time: 09/07/15 12:00 PM  Result Value Ref Range Status   C Diff antigen POSITIVE (A) NEGATIVE Final   C Diff toxin NEGATIVE NEGATIVE Final   C Diff interpretation   Final    C. difficile present,  but toxin not detected. This indicates colonization. In most cases, this does not require treatment. If patient has signs and symptoms consistent with colitis, consider treatment. Requires ENTERIC precautions.     Labs: Basic Metabolic Panel:  Recent Labs Lab 09/07/15 0952 09/08/15 0440 09/09/15 0420 09/10/15 0540  NA 136 136 137 134*  K 3.0* 2.6* 4.2 4.3  CL 94* 102 105 106  CO2 27 20* 18* 20*  GLUCOSE 88 67 70 90  BUN 8 <5* <5* <5*  CREATININE 0.45 0.48 0.35* 0.38*  CALCIUM 7.1* 6.0* 6.5* 6.8*  MG  --  0.7* 1.3* 1.3*   Liver Function Tests:  Recent Labs Lab 09/07/15 0952  AST 52*  ALT 16  ALKPHOS 246*  BILITOT 1.2  PROT 6.3*  ALBUMIN 2.8*    Recent Labs Lab 09/07/15 0952  LIPASE 15*   No results for input(s): AMMONIA in the last 168 hours. CBC:  Recent Labs Lab 09/07/15 0952 09/08/15 0440 09/09/15 0420  WBC 19.3* 13.2* 7.3  NEUTROABS 17.4*  --   --   HGB 9.0* 8.1* 8.9*  HCT  26.8* 25.4* 27.4*  MCV 99.3 101.2* 101.5*  PLT 334 290 298      Signed:  Tallie Dodds K  [facility] 09/10/2015, 3:53 PM

## 2015-09-10 NOTE — Discharge Instructions (Signed)
Clostridium Difficile Infection Clostridium difficile (C. difficile or C. diff) is a germ found in the intestines. C. difficile infection can happen after taking antibiotic medicines. Antiobiotics may cause the C. difficile to grow out of control and make a toxin that causes the infection. C. difficile infection can cause watery poop (diarrhea) or severe disease. HOME CARE  Drink enough fluids to keep your pee (urine) clear or pale yellow. Avoid milk, caffeine, and alcohol.  Ask your doctor how to replace the body fluids you lost (rehydrate).  Eat small meals often. Avoid eating large meals.  Take your antibiotics as told. Finish them even if you start to feel better.  Do not  take medicines that help watery poop. These medicines may stop you from healing as fast or cause problems.  Wash your hands after using the bathroom and before touching food. Make sure people who live with you wash their hands often.  Clean all surfaces in your home. Use a product that has chlorine bleach in it. GET HELP IF:  Your watery poop lasts longer than expected.  Your watery poop comes back after you finish your antibiotic medicine.  You feel very dry or thirsty (dehydrated).  You have a fever. GET HELP RIGHT AWAY IF:   You have more stomach pain or tenderness.  You have blood in your poop (stool). Your poop may look black and tar-like.  You cannot eat or drink without throwing up (vomiting).   This information is not intended to replace advice given to you by your health care provider. Make sure you discuss any questions you have with your health care provider.   Document Released: 09/02/2009 Document Revised: 11/26/2014 Document Reviewed: 05/09/2015 Elsevier Interactive Patient Education 2016 Elsevier Inc.  

## 2015-09-27 ENCOUNTER — Ambulatory Visit (INDEPENDENT_AMBULATORY_CARE_PROVIDER_SITE_OTHER): Payer: BLUE CROSS/BLUE SHIELD | Admitting: Physician Assistant

## 2015-09-27 VITALS — BP 126/80 | HR 117 | Temp 99.3°F | Resp 16 | Ht 62.0 in | Wt 101.8 lb

## 2015-09-27 DIAGNOSIS — IMO0001 Reserved for inherently not codable concepts without codable children: Secondary | ICD-10-CM

## 2015-09-27 DIAGNOSIS — R112 Nausea with vomiting, unspecified: Secondary | ICD-10-CM

## 2015-09-27 DIAGNOSIS — F1010 Alcohol abuse, uncomplicated: Secondary | ICD-10-CM | POA: Diagnosis not present

## 2015-09-27 DIAGNOSIS — R197 Diarrhea, unspecified: Secondary | ICD-10-CM

## 2015-09-27 DIAGNOSIS — Z8719 Personal history of other diseases of the digestive system: Secondary | ICD-10-CM

## 2015-09-27 DIAGNOSIS — F1410 Cocaine abuse, uncomplicated: Secondary | ICD-10-CM

## 2015-09-27 MED ORDER — ONDANSETRON 4 MG PO TBDP: 4.0000 mg | ORAL_TABLET | Freq: Three times a day (TID) | ORAL | Status: DC | PRN

## 2015-09-27 NOTE — Progress Notes (Signed)
[facility] [address], [place] [address] [phone]  Date:  09/27/2015   Name:  Holly Phelps   DOB:  February 16, 1961   MRN:  [id number]  PCP:  Holly Portela, MD    Chief Complaint: Emesis and Diarrhea   History of Present Illness:  This is a 54 y.o. female with PMH alcohol abuse, cocaine abuse, hx pancreatitis, hx C diff and recent recurrent UTIs and colitis, anemia who is presenting with 1 day of vomiting and diarrhea. Started vomiting yesterday around 3 pm. Has vomited a total of 4 times. When she woke this morning she started having diarrhea and has >3 episodes so far. Diarrhea is dark and watery, no blood. Has been chilled but hasn't checked temp at home. Denies abdominal pain. Has been staying hydrated - drank three 20 oz bottles last night. Yesterday had egg salad and chocolate mouse. No recent travel. She was recently treated for C diff 09/09/15 and was hospitalized for this. She was treated with 10 days or oral vanc and states she finished the course and symptoms resolved. She has been admitted several times in past 2 years for colitis/UTI/pancreatitis. She has hx of alcohol abuse but states she only has 1-2 shots per day. States "I dont drink very much". At hospital admissions she has hypomagnesemia d/t alcohol abuse though. No withdrawal symptoms at last hospital admission 08/2015. States she has had used cocaine recently - states "its been a while". tox screen in hospital 6/16 positive for cocaine. No tox screen obtained at recent hospital admission.  Pt does not want IV or blood drawn today. She states "I want to go home and sleep". She skirts the question of why she came in today and does not know what has worked for her in the past.  Review of Systems:  Review of Systems See HPI  Patient Active Problem List   Diagnosis Date Noted  . Hypomagnesemia 09/08/2015  . C. difficile colitis 09/07/2015  . Clostridium difficile colitis 09/07/2015  .  Hypokalemia 09/07/2015  . Tobacco use disorder 09/07/2015  . Cocaine abuse 05/27/2015  . UTI (urinary tract infection) 04/24/2015  . Intractable vomiting with nausea 04/24/2015  . Pancreatitis 01/29/2013  . Anemia 01/29/2013  . Nausea vomiting and diarrhea 01/26/2013  . Esophagitis 01/26/2013  . HYPOTHYROIDISM 03/01/2008  . ANXIETY 03/01/2008  . Alcohol abuse 03/01/2008  . HYPERTENSION 03/01/2008  . LIVER FUNCTION TESTS, ABNORMAL, HX OF 03/01/2008    Prior to Admission medications   Medication Sig Start Date End Date Taking? Authorizing Provider  Cranberry 500 MG CHEW Chew 1 tablet by mouth 2 (two) times daily.   Yes Historical Provider, MD  ferrous gluconate (FERGON) 240 (27 FE) MG tablet Take 240 mg by mouth daily.   Yes Historical Provider, MD  ibuprofen (ADVIL,MOTRIN) 200 MG tablet Take 200-400 mg by mouth every 6 (six) hours as needed for moderate pain.   Yes Historical Provider, MD  Multiple Vitamin (MULTIVITAMIN WITH MINERALS) TABS Take 1 tablet by mouth daily. 01/29/13  Yes [name], MD  thiamine 100 MG tablet Take 1 tablet (100 mg total) by mouth daily. 04/27/15  Yes [name], DO  docusate sodium (COLACE) 100 MG capsule Take 100 mg by mouth daily.    Historical Provider, MD    Allergies  Allergen Reactions  . Codeine Nausea And Vomiting  . Flexeril [Cyclobenzaprine] Itching  . Sulfa Antibiotics     Pt does not recall what the reaction  was,been too long  . Synthroid [Levothyroxine] Rash    Past Surgical History  Procedure Laterality Date  . Laparoscopic appendectomy    . Back surgery      x 2  . Colonoscopy N/A 03/13/2013    Procedure: COLONOSCOPY;  Surgeon: Leighton Ruff, MD;  Location: [facility] ENDOSCOPY;  Service: Endoscopy;  Laterality: N/A;    Social History  Substance Use Topics  . Smoking status: Current Every Day Smoker -- 0.50 packs/day    Types: Cigarettes  . Smokeless tobacco: None     Comment: 3 cigarettes per day  . Alcohol Use: 6.0 oz/week     10 Glasses of wine per week     Comment: occasional    Family History  Problem Relation Age of Onset  . Stroke Father   . Stroke Brother   . Stroke Brother     Medication list has been reviewed and updated.  Physical Examination:  Physical Exam  Constitutional: She is oriented to person, place, and time. She appears well-developed and well-nourished. No distress.  HENT:  Head: Normocephalic and atraumatic.  Right Ear: Hearing normal.  Left Ear: Hearing normal.  Nose: Nose normal.  Mouth/Throat: Uvula is midline and oropharynx is clear and moist.  Lips dry  Eyes: Conjunctivae and lids are normal. Right eye exhibits no discharge. Left eye exhibits no discharge. No scleral icterus.  Cardiovascular: Regular rhythm, normal heart sounds and normal pulses.  Tachycardia present.   No murmur heard. Pulmonary/Chest: Effort normal and breath sounds normal. No respiratory distress. She has no wheezes. She has no rhonchi. She has no rales.  Abdominal: Soft. Normal appearance. Bowel sounds are increased. There is no CVA tenderness.  Only mild lower abdominal tenderness  Musculoskeletal: Normal range of motion.  Lymphadenopathy:       Head (right side): No submental, no submandibular and no tonsillar adenopathy present.       Head (left side): No submental, no submandibular and no tonsillar adenopathy present.    She has no cervical adenopathy.  Neurological: She is alert and oriented to person, place, and time.  Skin: Skin is warm, dry and intact. No lesion and no rash noted.  Psychiatric: She has a normal mood and affect. Her speech is normal and behavior is normal. Thought content normal.   BP 126/80 mmHg  Pulse 117  Temp(Src) 99.3 F (37.4 C) (Oral)  Resp 16  Ht _0  (1.575 m)  Wt 101 lb 12.8 oz (46.176 kg)  BMI 18.61 kg/m2  SpO2 97%  Assessment and Plan:  1. Non-intractable vomiting with nausea, vomiting of unspecified type 2. Diarrhea, unspecified type 3. Hx C diff 4.  Alcohol abuse 5. Cocaine abuse Pt with recurrent n/v/d, low grade fever and evidence of dehydration. History C diff 3 weeks ago, treated with vanc. Unsure if pt is forthcoming with alcohol and cocaine use. She does take thiamine daily. She does not see a PCP regularly. Pt has evidence of dehydration with tachycardia and dry mucous membranes. She refused IV hydration and blood work stating "I've been stuck too many times". She was sent home with stool specimen kit and says her husband will bring specimen by tomorrow. Counseled on importance of hydration. Counseled on BRAT diet. Zofran rx given. Advised if she develops worsening fever or becomes unable to keep liquids down she needs to return for lab work and hydration. Pt voiced understanding. - ondansetron (ZOFRAN ODT) 4 MG disintegrating tablet; Take 1 tablet (4 mg total) by mouth every 8 (  eight) hours as needed for nausea or vomiting.  Dispense: 20 tablet; Refill: 0 - Ova and parasite examination - Stool culture - Fecal Lactoferrin - Clostridium Difficile by PCR   Benjaman Pott. Drenda Freeze, MHS [facility]  09/27/2015

## 2015-09-27 NOTE — Patient Instructions (Addendum)
Take zofran as needed for nausea. Hydrate with AT LEAST 64 oz water a day See diet below that is good for diarrhea Return with your stool sample. If your symptoms worsen - if you develop worsening fever or if you are unable to keep liquids down, you need to return for blood work and IV hydration.  Food Choices to Help Relieve Diarrhea, Adult When you have diarrhea, the foods you eat and your eating habits are very important. Choosing the right foods and drinks can help relieve diarrhea. Also, because diarrhea can last up to 7 days, you need to replace lost fluids and electrolytes (such as sodium, potassium, and chloride) in order to help prevent dehydration.  WHAT GENERAL GUIDELINES DO I NEED TO FOLLOW?  Slowly drink 1 cup (8 oz) of fluid for each episode of diarrhea. If you are getting enough fluid, your urine will be clear or pale yellow.  Eat starchy foods. Some good choices include white rice, white toast, pasta, low-fiber cereal, baked potatoes (without the skin), saltine crackers, and bagels.  Avoid large servings of any cooked vegetables.  Limit fruit to two servings per day. A serving is  cup or 1 small piece.  Choose foods with less than 2 g of fiber per serving.  Limit fats to less than 8 tsp (38 g) per day.  Avoid fried foods.  Eat foods that have probiotics in them. Probiotics can be found in certain dairy products.  Avoid foods and beverages that may increase the speed at which food moves through the stomach and intestines (gastrointestinal tract). Things to avoid include:  High-fiber foods, such as dried fruit, raw fruits and vegetables, nuts, seeds, and whole grain foods.  Spicy foods and high-fat foods.  Foods and beverages sweetened with high-fructose corn syrup, honey, or sugar alcohols such as xylitol, sorbitol, and mannitol. WHAT FOODS ARE RECOMMENDED? Grains White rice. White, [place], or pita breads (fresh or toasted), including plain rolls, buns, or bagels.  White pasta. Saltine, soda, or graham crackers. Pretzels. Low-fiber cereal. Cooked cereals made with water (such as cornmeal, farina, or cream cereals). Plain muffins. Matzo. Melba toast. Zwieback.  Vegetables Potatoes (without the skin). Strained tomato and vegetable juices. Most well-cooked and canned vegetables without seeds. Tender lettuce. Fruits Cooked or canned applesauce, apricots, cherries, fruit cocktail, grapefruit, peaches, pears, or plums. Fresh bananas, apples without skin, cherries, grapes, cantaloupe, grapefruit, peaches, oranges, or plums.  Meat and Other Protein Products Baked or boiled chicken. Eggs. Tofu. Fish. Seafood. Smooth peanut butter. Ground or well-cooked tender beef, ham, veal, lamb, pork, or poultry.  Dairy Plain yogurt, kefir, and unsweetened liquid yogurt. Lactose-free milk, buttermilk, or soy milk. Plain hard cheese. Beverages Sport drinks. Clear broths. Diluted fruit juices (except prune). Regular, caffeine-free sodas such as ginger ale. Water. Decaffeinated teas. Oral rehydration solutions. Sugar-free beverages not sweetened with sugar alcohols. Other Bouillon, broth, or soups made from recommended foods.  The items listed above may not be a complete list of recommended foods or beverages. Contact your dietitian for more options. WHAT FOODS ARE NOT RECOMMENDED? Grains Whole grain, whole wheat, bran, or rye breads, rolls, pastas, crackers, and cereals. Wild or brown rice. Cereals that contain more than 2 g of fiber per serving. Corn tortillas or taco shells. Cooked or dry oatmeal. Granola. Popcorn. Vegetables Raw vegetables. Cabbage, broccoli, Brussels sprouts, artichokes, baked beans, beet greens, corn, kale, legumes, peas, sweet potatoes, and yams. Potato skins. Cooked spinach and cabbage. Fruits Dried fruit, including raisins and dates. Raw fruits.  Stewed or dried prunes. Fresh apples with skin, apricots, mangoes, pears, raspberries, and strawberries.  Meat  and Other Protein Products Chunky peanut butter. Nuts and seeds. Beans and lentils. Berniece Salines.  Dairy High-fat cheeses. Milk, chocolate milk, and beverages made with milk, such as milk shakes. Cream. Ice cream. Sweets and Desserts Sweet rolls, doughnuts, and sweet breads. Pancakes and waffles. Fats and Oils Butter. Cream sauces. Margarine. Salad oils. Plain salad dressings. Olives. Avocados.  Beverages Caffeinated beverages (such as coffee, tea, soda, or energy drinks). Alcoholic beverages. Fruit juices with pulp. Prune juice. Soft drinks sweetened with high-fructose corn syrup or sugar alcohols. Other Coconut. Hot sauce. Chili powder. Mayonnaise. Gravy. Cream-based or milk-based soups.  The items listed above may not be a complete list of foods and beverages to avoid. Contact your dietitian for more information. WHAT SHOULD I DO IF I BECOME DEHYDRATED? Diarrhea can sometimes lead to dehydration. Signs of dehydration include dark urine and dry mouth and skin. If you think you are dehydrated, you should rehydrate with an oral rehydration solution. These solutions can be purchased at pharmacies, retail stores, or online.  Drink -1 cup (120-240 mL) of oral rehydration solution each time you have an episode of diarrhea. If drinking this amount makes your diarrhea worse, try drinking smaller amounts more often. For example, drink 1-3 tsp (5-15 mL) every 5-10 minutes.  A general rule for staying hydrated is to drink 1-2 L of fluid per day. Talk to your health care provider about the specific amount you should be drinking each day. Drink enough fluids to keep your urine clear or pale yellow.   This information is not intended to replace advice given to you by your health care provider. Make sure you discuss any questions you have with your health care provider.   Document Released: 01/26/2004 Document Revised: 11/26/2014 Document Reviewed: 09/28/2013 Elsevier Interactive Patient Education [employer].

## 2015-09-30 LAB — FECAL LACTOFERRIN, QUANT: Lactoferrin: NEGATIVE

## 2015-09-30 LAB — CLOSTRIDIUM DIFFICILE BY PCR: Toxigenic C. Difficile by PCR: NOT DETECTED

## 2015-10-01 ENCOUNTER — Ambulatory Visit (INDEPENDENT_AMBULATORY_CARE_PROVIDER_SITE_OTHER): Payer: BLUE CROSS/BLUE SHIELD | Admitting: Physician Assistant

## 2015-10-01 ENCOUNTER — Emergency Department (HOSPITAL_COMMUNITY)
Admission: EM | Admit: 2015-10-01 | Discharge: 2015-10-01 | Disposition: A | Discharge status: Home / Self Care | Payer: BLUE CROSS/BLUE SHIELD | Attending: Emergency Medicine | Admitting: Emergency Medicine

## 2015-10-01 ENCOUNTER — Encounter (HOSPITAL_COMMUNITY): Payer: Self-pay

## 2015-10-01 ENCOUNTER — Emergency Department (HOSPITAL_COMMUNITY): Payer: BLUE CROSS/BLUE SHIELD

## 2015-10-01 VITALS — BP 100/76 | HR 131 | Temp 98.4°F | Resp 16 | Ht 62.0 in | Wt 96.2 lb

## 2015-10-01 DIAGNOSIS — Z720 Tobacco use: Secondary | ICD-10-CM | POA: Insufficient documentation

## 2015-10-01 DIAGNOSIS — Z8619 Personal history of other infectious and parasitic diseases: Secondary | ICD-10-CM | POA: Diagnosis not present

## 2015-10-01 DIAGNOSIS — R1111 Vomiting without nausea: Secondary | ICD-10-CM

## 2015-10-01 DIAGNOSIS — Z79899 Other long term (current) drug therapy: Secondary | ICD-10-CM | POA: Diagnosis not present

## 2015-10-01 DIAGNOSIS — Z8639 Personal history of other endocrine, nutritional and metabolic disease: Secondary | ICD-10-CM | POA: Diagnosis not present

## 2015-10-01 DIAGNOSIS — D649 Anemia, unspecified: Secondary | ICD-10-CM | POA: Diagnosis not present

## 2015-10-01 DIAGNOSIS — I10 Essential (primary) hypertension: Secondary | ICD-10-CM | POA: Insufficient documentation

## 2015-10-01 DIAGNOSIS — R1110 Vomiting, unspecified: Secondary | ICD-10-CM | POA: Insufficient documentation

## 2015-10-01 DIAGNOSIS — Z8719 Personal history of other diseases of the digestive system: Secondary | ICD-10-CM | POA: Insufficient documentation

## 2015-10-01 DIAGNOSIS — R112 Nausea with vomiting, unspecified: Secondary | ICD-10-CM

## 2015-10-01 LAB — CBC WITH DIFFERENTIAL/PLATELET
Basophils Absolute: 0 10*3/uL (ref 0.0–0.1)
Basophils Relative: 0 %
Eosinophils Absolute: 0 10*3/uL (ref 0.0–0.7)
Eosinophils Relative: 0 %
HCT: 26.7 % — ABNORMAL LOW (ref 36.0–46.0)
Hemoglobin: 8.9 g/dL — ABNORMAL LOW (ref 12.0–15.0)
Lymphocytes Relative: 14 %
Lymphs Abs: 2.9 10*3/uL (ref 0.7–4.0)
MCH: 33.3 pg (ref 26.0–34.0)
MCHC: 33.3 g/dL (ref 30.0–36.0)
MCV: 100 fL (ref 78.0–100.0)
Monocytes Absolute: 2.7 10*3/uL — ABNORMAL HIGH (ref 0.1–1.0)
Monocytes Relative: 13 %
Neutro Abs: 15.1 10*3/uL — ABNORMAL HIGH (ref 1.7–7.7)
Neutrophils Relative %: 73 %
Platelets: 330 10*3/uL (ref 150–400)
RBC: 2.67 MIL/uL — ABNORMAL LOW (ref 3.87–5.11)
RDW: 15.5 % (ref 11.5–15.5)
WBC: 20.7 10*3/uL — ABNORMAL HIGH (ref 4.0–10.5)

## 2015-10-01 LAB — COMPLETE METABOLIC PANEL WITH GFR
ALT: 28 U/L (ref 6–29)
AST: 70 U/L — ABNORMAL HIGH (ref 10–35)
Albumin: 3.6 g/dL (ref 3.6–5.1)
Alkaline Phosphatase: 229 U/L — ABNORMAL HIGH (ref 33–130)
BUN: 19 mg/dL (ref 7–25)
CO2: 25 mmol/L (ref 20–31)
Calcium: 9.5 mg/dL (ref 8.6–10.4)
Chloride: 95 mmol/L — ABNORMAL LOW (ref 98–110)
Creat: 0.5 mg/dL (ref 0.50–1.05)
GFR, Est African American: >89 mL/min (ref >=60)
GFR, Est Non African American: >89 mL/min (ref >=60)
Glucose, Bld: 101 mg/dL — ABNORMAL HIGH (ref 65–99)
Potassium: 3.4 mmol/L — ABNORMAL LOW (ref 3.5–5.3)
Sodium: 135 mmol/L (ref 135–146)
Total Bilirubin: 0.6 mg/dL (ref 0.2–1.2)
Total Protein: 6.5 g/dL (ref 6.1–8.1)

## 2015-10-01 LAB — POCT CBC
Granulocyte percent: 75.5 %G (ref 37–80)
HCT, POC: 26.4 % — AB (ref 37.7–47.9)
Hemoglobin: 8.9 g/dL — AB (ref 12.2–16.2)
Lymph, poc: 4.4 — AB (ref 0.6–3.4)
MCH, POC: 32.7 pg — AB (ref 27–31.2)
MCHC: 33.6 g/dL (ref 31.8–35.4)
MCV: 97.3 fL — AB (ref 80–97)
MID (cbc): 0.3 (ref 0–0.9)
MPV: 6.3 fL (ref 0–99.8)
POC Granulocyte: 14.4 — AB (ref 2–6.9)
POC LYMPH PERCENT: 22.9 %L (ref 10–50)
POC MID %: 1.6 %M (ref 0–12)
Platelet Count, POC: 341 10*3/uL (ref 142–424)
RBC: 2.72 M/uL — AB (ref 4.04–5.48)
RDW, POC: 16.2 %
WBC: 19.1 10*3/uL — AB (ref 4.6–10.2)

## 2015-10-01 LAB — COMPREHENSIVE METABOLIC PANEL
ALT: 32 U/L (ref 14–54)
AST: 76 U/L — ABNORMAL HIGH (ref 15–41)
Albumin: 3.5 g/dL (ref 3.5–5.0)
Alkaline Phosphatase: 204 U/L — ABNORMAL HIGH (ref 38–126)
Anion gap: 15 (ref 5–15)
BUN: 21 mg/dL — ABNORMAL HIGH (ref 6–20)
CO2: 24 mmol/L (ref 22–32)
Calcium: 9.6 mg/dL (ref 8.9–10.3)
Chloride: 96 mmol/L — ABNORMAL LOW (ref 101–111)
Creatinine, Ser: 0.53 mg/dL (ref 0.44–1.00)
GFR calc Af Amer: >60 mL/min (ref >60)
GFR calc non Af Amer: >60 mL/min (ref >60)
Glucose, Bld: 98 mg/dL (ref 65–99)
Potassium: 3.1 mmol/L — ABNORMAL LOW (ref 3.5–5.1)
Sodium: 135 mmol/L (ref 135–145)
Total Bilirubin: 1.2 mg/dL (ref 0.3–1.2)
Total Protein: 7.1 g/dL (ref 6.5–8.1)

## 2015-10-01 LAB — LIPASE, BLOOD: Lipase: 19 U/L (ref 11–51)

## 2015-10-01 LAB — URINALYSIS, ROUTINE W REFLEX MICROSCOPIC
Glucose, UA: NEGATIVE mg/dL
Hgb urine dipstick: NEGATIVE
Ketones, ur: >80 mg/dL — AB
Leukocytes, UA: NEGATIVE
Nitrite: NEGATIVE
Protein, ur: NEGATIVE mg/dL
Specific Gravity, Urine: 1.023 (ref 1.005–1.030)
Urobilinogen, UA: 0.2 mg/dL (ref 0.0–1.0)
pH: 6.5 (ref 5.0–8.0)

## 2015-10-01 LAB — LIPASE: Lipase: <5 U/L — ABNORMAL LOW (ref 7–60)

## 2015-10-01 IMAGING — CT CT ABD-PELV W/ CM
2 of 5 series · 15 of 46 positions shown, 17 images · IV contrast (OMNIPAQUE 300)
Comparison: CT of the abdomen and pelvis from [DATE], and
abdominal ultrasound performed [DATE]

CLINICAL DATA: Acute onset of nausea, vomiting and leukocytosis.
Initial encounter.

EXAM:
CT ABDOMEN AND PELVIS WITH CONTRAST
TECHNIQUE: Multidetector CT imaging of the abdomen and pelvis was performed
using the standard protocol following bolus administration of
intravenous contrast.
CONTRAST:  80mL OMNIPAQUE IOHEXOL 300 MG/ML  SOLN

[Series 2: abd/pel with · axial · 0.63mm/px · z∈[-481,-81]mm · 12 of 90 slices shown, 14 images]
[im 5/90  soft-tissue]
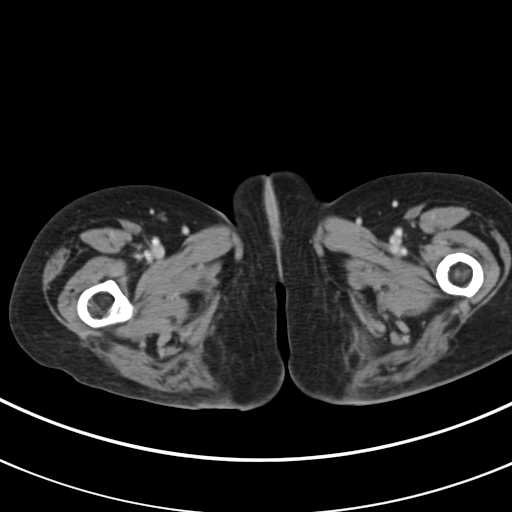
[im 5/90  bone]
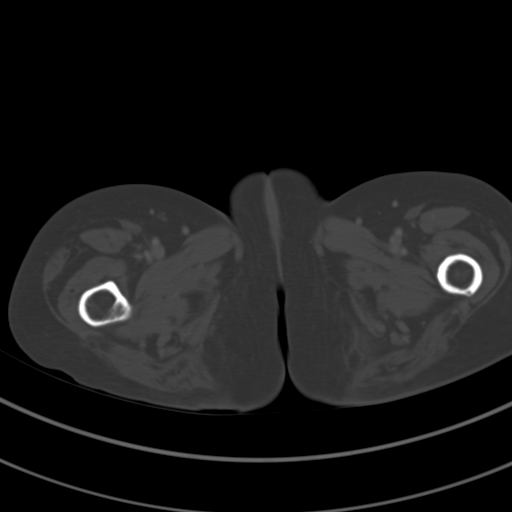
[im 15/90  soft-tissue]
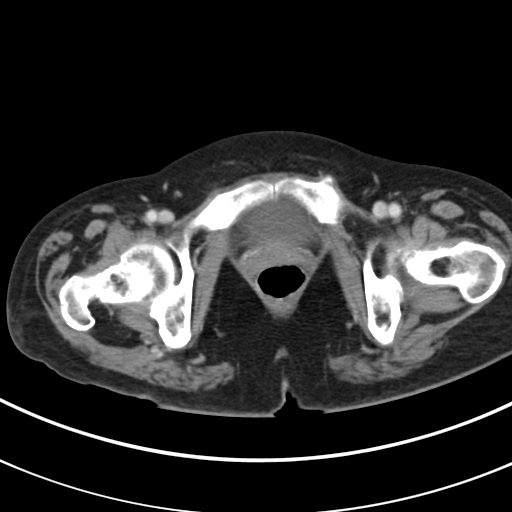
[im 19/90  soft-tissue]
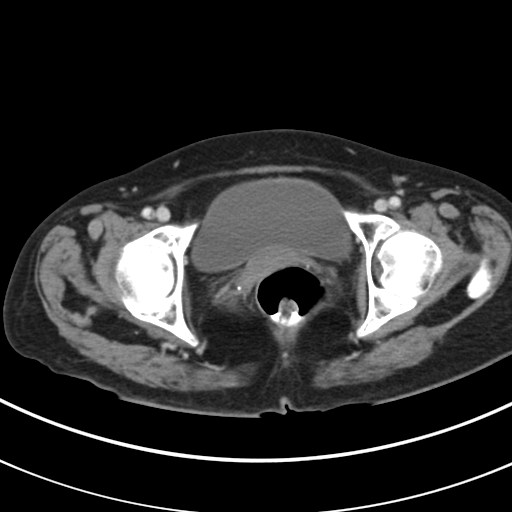
[im 29/90  soft-tissue]
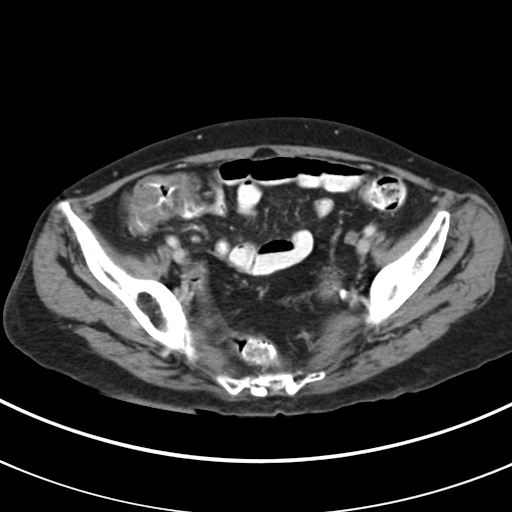
[im 33/90  soft-tissue]
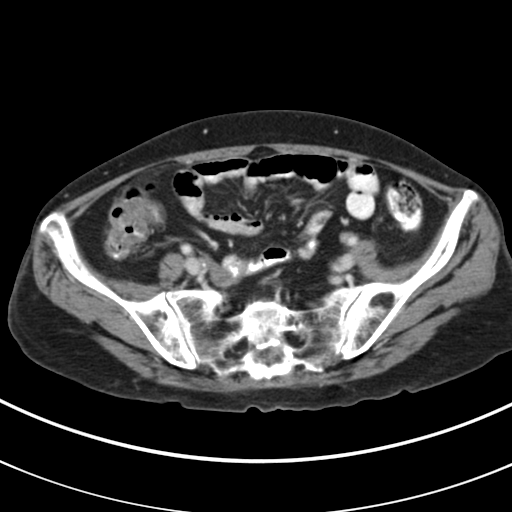
[im 43/90  soft-tissue]
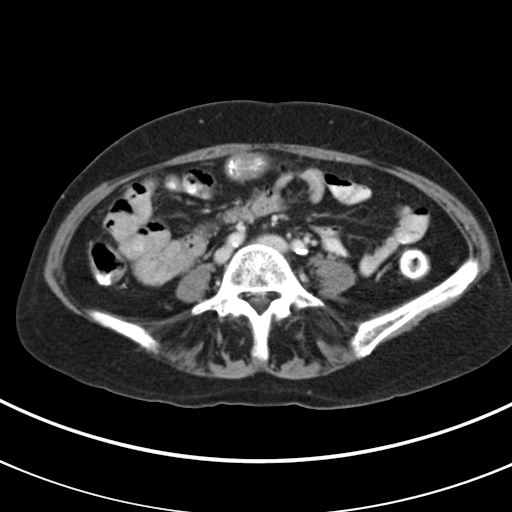
[im 47/90  soft-tissue]
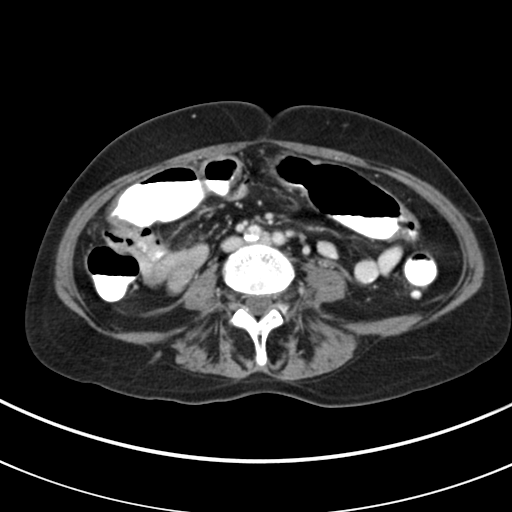
[im 57/90  soft-tissue]
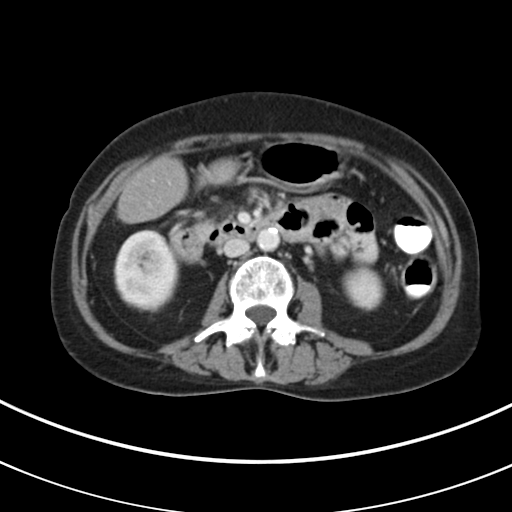
[im 61/90  soft-tissue]
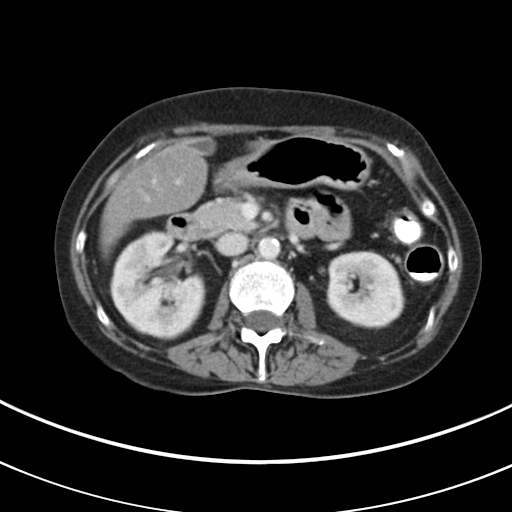
[im 61/90  bone]
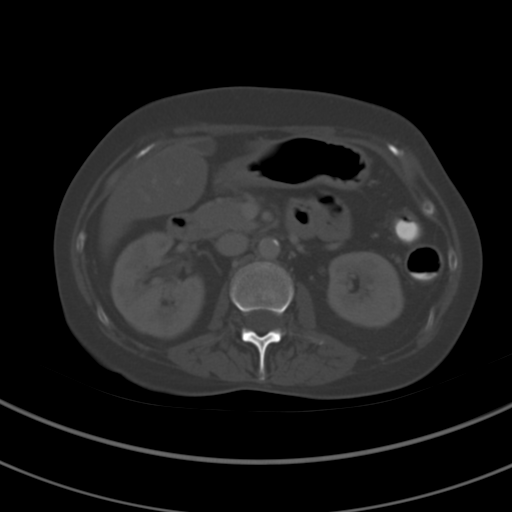
[im 71/90  soft-tissue]
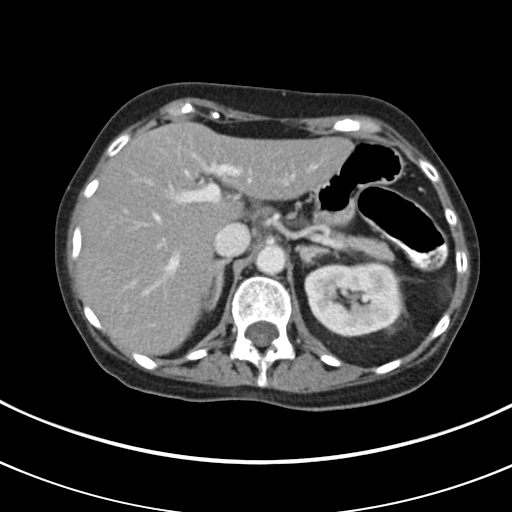
[im 75/90  soft-tissue]
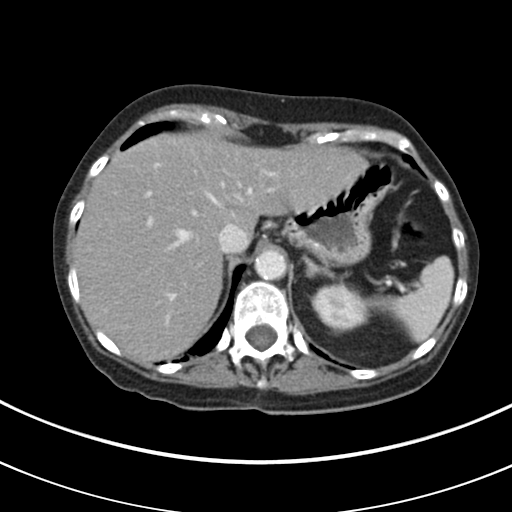
[im 85/90  soft-tissue]
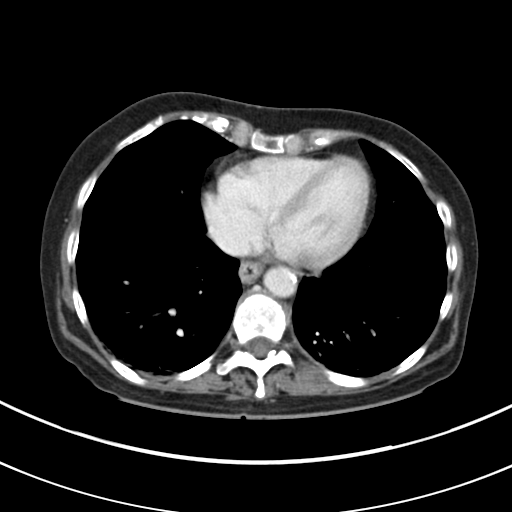

[Series 5: coronal a/|p · coronal · 0.74mm/px · 3 of 86 slices shown]
[im 29/86  soft-tissue]
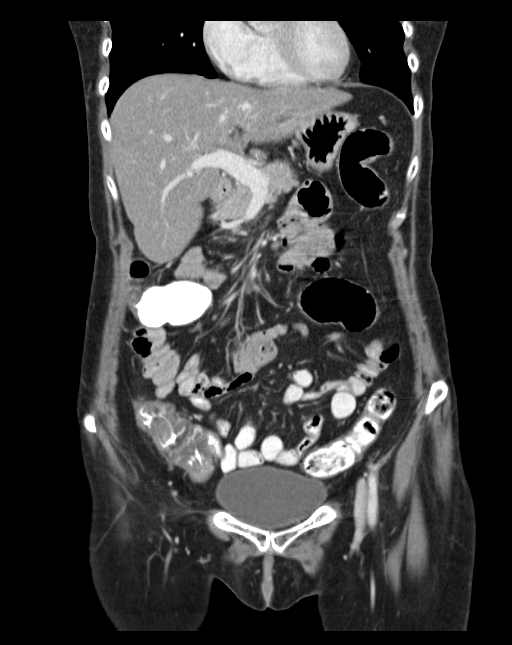
[im 38/86  soft-tissue]
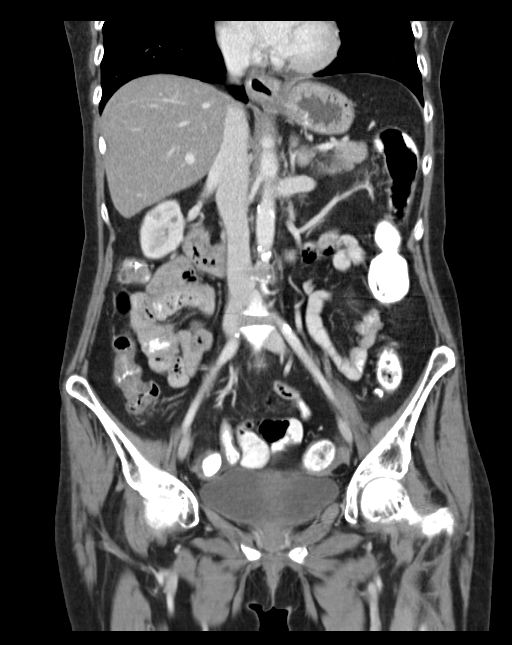
[im 48/86  soft-tissue]
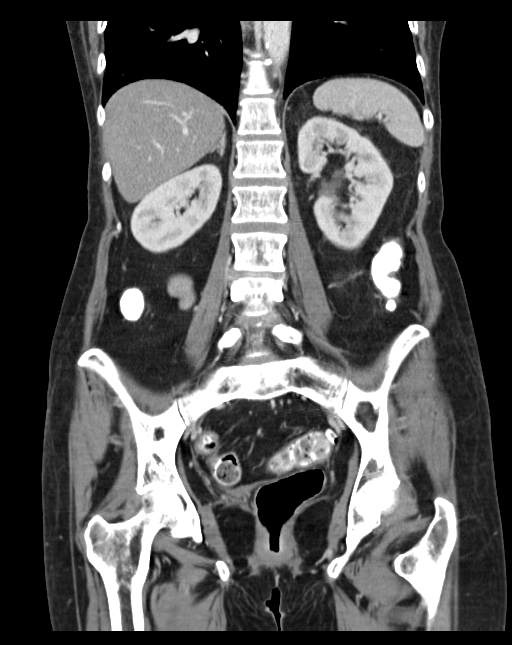

[15 of 46 positions shown; findings below may reference images not displayed]

FINDINGS: Minimal bibasilar atelectasis is noted.

The liver and spleen are unremarkable in appearance. The gallbladder
is within normal limits. The pancreas and adrenal glands are
unremarkable.

The kidneys are unremarkable in appearance. There is no evidence of
hydronephrosis. No renal or ureteral stones are seen. Mild
perinephric stranding is noted bilaterally.

No free fluid is identified. The small bowel is unremarkable in
appearance. The stomach is within normal limits. No acute vascular
abnormalities are seen. Scattered calcification is noted along the
abdominal aorta and its branches.

The patient is status post appendectomy.

There is mild wall thickening along the cecum and proximal ascending
colon, with mild associated soft tissue inflammation. This is
similar in appearance to the prior study. Diverticulosis is noted
along the descending and proximal sigmoid colon. There is loss of
the normal haustral pattern along the sigmoid colon, which may
reflect sequelae of previously noted colitis.

The bladder is mildly distended and grossly unremarkable. The uterus
is unremarkable in appearance. The ovaries are relatively symmetric.
No suspicious adnexal masses are seen. No inguinal lymphadenopathy
is seen.

No acute osseous abnormalities are identified.
IMPRESSION: 1. Persistent colitis noted involving the cecum and ascending colon,
with mild wall thickening and associated soft tissue inflammation.
Given known C. difficile, this likely reflects persistent C.
difficile colitis. Previous noted involvement of the remainder of
the colon has resolved, though there is underlying chronic loss of
the normal haustral pattern of the sigmoid colon.
2. Diverticulosis along the descending and proximal sigmoid colon,
without evidence of diverticulitis.
3. Scattered calcification along the abdominal aorta and its
branches.

## 2015-10-01 MED ORDER — ONDANSETRON HCL 4 MG PO TABS: 4.0000 mg | ORAL_TABLET | Freq: Four times a day (QID) | ORAL | Status: DC

## 2015-10-01 MED ORDER — IOHEXOL 300 MG/ML  SOLN
25.0000 mL | Freq: Once | INTRAMUSCULAR | Status: AC | PRN
  Administered 2015-10-01: 25 mL via ORAL

## 2015-10-01 MED ORDER — ONDANSETRON HCL 4 MG/2ML IJ SOLN
4.0000 mg | Freq: Once | INTRAMUSCULAR | Status: AC
  Administered 2015-10-01: 4 mg via INTRAVENOUS
  Filled 2015-10-01: qty 2

## 2015-10-01 MED ORDER — IOHEXOL 300 MG/ML  SOLN
80.0000 mL | Freq: Once | INTRAMUSCULAR | Status: AC | PRN
  Administered 2015-10-01: 80 mL via INTRAVENOUS

## 2015-10-01 MED ORDER — SODIUM CHLORIDE 0.9 % IV BOLUS (SEPSIS)
1000.0000 mL | Freq: Once | INTRAVENOUS | Status: AC
  Administered 2015-10-01: 1000 mL via INTRAVENOUS

## 2015-10-01 MED ORDER — POTASSIUM CHLORIDE CRYS ER 20 MEQ PO TBCR
40.0000 meq | EXTENDED_RELEASE_TABLET | Freq: Once | ORAL | Status: AC
  Administered 2015-10-01: 40 meq via ORAL
  Filled 2015-10-01: qty 2

## 2015-10-01 MED ORDER — MORPHINE SULFATE (PF) 4 MG/ML IV SOLN
4.0000 mg | Freq: Once | INTRAVENOUS | Status: AC
  Administered 2015-10-01: 4 mg via INTRAVENOUS
  Filled 2015-10-01: qty 1

## 2015-10-01 MED ORDER — ONDANSETRON 8 MG PO TBDP
8.0000 mg | ORAL_TABLET | Freq: Once | ORAL | Status: AC
  Administered 2015-10-01: 8 mg via ORAL
  Filled 2015-10-01: qty 1

## 2015-10-01 MED ORDER — SODIUM CHLORIDE 0.9 % IV BOLUS (SEPSIS)
1000.0000 mL | Freq: Once | INTRAVENOUS | Status: DC
Start: 2015-10-01 — End: 2015-10-01

## 2015-10-01 NOTE — ED Provider Notes (Signed)
  Physical Exam  BP 94/62 mmHg  Pulse 102  Temp(Src) 98.6 F (37 C) (Oral)  Resp 18  SpO2 98%  Physical Exam  ED Course  Procedures  Patient was signed out to me by Lenn Sink, PA-C. She presented with nausea and vomiting with a recent diagnosis of c-diff colitis. She had leukocytosis of 20,000. She was given potassium. She had no episodes of vomiting while in the ED. She is able to tolerate fluids. No abdominal pain, diarrhea, or fever.  CT abdomen is pending to rule out abscess. The plan is to DC the patient if nausea and pain can be controlled.  CT abdomen shows persistent colitis involving the cecum and descending colon with wall thickening in tissue inflammation. She has no diverticulitis or abscess. Recheck:  Patient still has 4/10 pain but no new episodes of vomiting.  Medications  sodium chloride 0.9 % bolus 1,000 mL (0 mLs Intravenous Stopped 10/01/15 1526)  potassium chloride SA (K-DUR,KLOR-CON) CR tablet 40 mEq (40 mEq Oral Given 10/01/15 1500)  iohexol (OMNIPAQUE) 300 MG/ML solution 80 mL (80 mLs Intravenous Contrast Given 10/01/15 1837)  iohexol (OMNIPAQUE) 300 MG/ML solution 25 mL (25 mLs Oral Contrast Given 10/01/15 1837)  morphine 4 MG/ML injection 4 mg (4 mg Intravenous Given 10/01/15 1949)  ondansetron (ZOFRAN) injection 4 mg (4 mg Intravenous Given 10/01/15 1949)  ondansetron (ZOFRAN-ODT) disintegrating tablet 8 mg (8 mg Oral Given 10/01/15 2156)  She remains afebrile and her vitals are stable. She states her pain as improved.  She was discharged with zofran. I discussed return precautions with the patient as well as follow-up. She verbally agrees with the plan.      Ottie Glazier, PA-C 10/01/15 P[place]ell, DO 10/02/15 [id number]

## 2015-10-01 NOTE — Discharge Instructions (Signed)
Nausea and Vomiting Follow up with your pcp.  Take zofran for nausea. Take tylenol for abdominal pain.  Nausea means you feel sick to your stomach. Throwing up (vomiting) is a reflex where stomach contents come out of your mouth. HOME CARE   Take medicine as told by your doctor.  Do not force yourself to eat. However, you do need to drink fluids.  If you feel like eating, eat a normal diet as told by your doctor.  Eat rice, wheat, potatoes, bread, lean meats, yogurt, fruits, and vegetables.  Avoid high-fat foods.  Drink enough fluids to keep your pee (urine) clear or pale yellow.  Ask your doctor how to replace body fluid losses (rehydrate). Signs of body fluid loss (dehydration) include:  Feeling very thirsty.  Dry lips and mouth.  Feeling dizzy.  Dark pee.  Peeing less than normal.  Feeling confused.  Fast breathing or heart rate. GET HELP RIGHT AWAY IF:   You have blood in your throw up.  You have black or bloody poop (stool).  You have a bad headache or stiff neck.  You feel confused.  You have bad belly (abdominal) pain.  You have chest pain or trouble breathing.  You do not pee at least once every 8 hours.  You have cold, clammy skin.  You keep throwing up after 24 to 48 hours.  You have a fever. MAKE SURE YOU:   Understand these instructions.  Will watch your condition.  Will get help right away if you are not doing well or get worse.   This information is not intended to replace advice given to you by your health care provider. Make sure you discuss any questions you have with your health care provider.   Document Released: 04/23/2008 Document Revised: 01/28/2012 Document Reviewed: 04/06/2011 Elsevier Interactive Patient Education [employer].

## 2015-10-01 NOTE — ED Notes (Signed)
Pt was seen at [facility] and was sent here for c/o n/v since last night. Denies diarrhea, fever, pain, sts was told her WBC was high.

## 2015-10-01 NOTE — ED Provider Notes (Signed)
CSN: [id number]     Arrival date & time 10/01/15  1117 History   First MD Initiated Contact with Patient 10/01/15 1148     Chief Complaint  Patient presents with  . Emesis   HPI    54 year old female presents today with nausea or vomiting. Patient is significant past medical history pancreatitis, recent C. difficile infection 3 weeks ago, alcohol and cocaine abuse. She reports symptoms started last night with 3-4 episodes of nonbloody emesis. She denies any abdominal pain, fever, chills, chest pain, cough, urinary complaints, or diarrhea. She reports normal stool today. Patient reports a long-standing history of the same, she was seen at family practice today and sent over here due to elevated white blood cell count. She was seen 4 days ago for the same and discharged home with return precautions.  Patient with recent stool culture that was C. difficile negative. Patient tolerating by mouth intake.  Past Medical History  Diagnosis Date  . Allergy   . Anemia   . 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 Hypertension   . Hypothyroidism   . GERD (gastroesophageal reflux disease)   . Headache    Past Surgical History  Procedure Laterality Date  . Laparoscopic appendectomy    . Back surgery      x 2  . Colonoscopy N/A 03/13/2013    Procedure: COLONOSCOPY;  Surgeon: Leighton Ruff, MD;  Location: [facility];  Service: Endoscopy;  Laterality: N/A;   Family History  Problem Relation Age of Onset  . Stroke Father   . Stroke Brother   . Stroke Brother    Social History  Substance Use Topics  . Smoking status: Current Every Day Smoker -- 0.50 packs/day    Types: Cigarettes  . Smokeless tobacco: None     Comment: 3 cigarettes per day  . Alcohol Use: 6.0 oz/week    10 Glasses of wine per week     Comment: occasional   OB History    No data available     Review of Systems  All other systems reviewed and are negative.   Allergies  Codeine; Flexeril; Sulfa antibiotics; and Synthroid  Home  Medications   Prior to Admission medications   Medication Sig Start Date End Date Taking? Authorizing Provider  Cranberry 500 MG CHEW Chew 1 tablet by mouth 2 (two) times daily.    Historical Provider, MD  docusate sodium (COLACE) 100 MG capsule Take 100 mg by mouth daily.    Historical Provider, MD  ferrous gluconate (FERGON) 240 (27 FE) MG tablet Take 240 mg by mouth daily.    Historical Provider, MD  ibuprofen (ADVIL,MOTRIN) 200 MG tablet Take 200-400 mg by mouth every 6 (six) hours as needed for moderate pain.    Historical Provider, MD  Multiple Vitamin (MULTIVITAMIN WITH MINERALS) TABS Take 1 tablet by mouth daily. 01/29/13   [name], MD  ondansetron (ZOFRAN ODT) 4 MG disintegrating tablet Take 1 tablet (4 mg total) by mouth every 8 (eight) hours as needed for nausea or vomiting. 09/27/15   Ezekiel Slocumb, PA-C  thiamine 100 MG tablet Take 1 tablet (100 mg total) by mouth daily. 04/27/15   [name], DO   BP 126/84 mmHg  Pulse 120  Temp(Src) 99.2 F (37.3 C) (Oral)  Resp 18  SpO2 100%   Physical Exam  Constitutional: She is oriented to person, place, and time. She appears well-developed and well-nourished.  Nontoxic in no acute distress  HENT:  Head:  Normocephalic and atraumatic.  Eyes: Conjunctivae are normal. Pupils are equal, round, and reactive to light. Right eye exhibits no discharge. Left eye exhibits no discharge. No scleral icterus.  Neck: Normal range of motion. No JVD present. No tracheal deviation present.  Cardiovascular: Regular rhythm, normal heart sounds and intact distal pulses.  Exam reveals no gallop and no friction rub.   No murmur heard. Pulmonary/Chest: Effort normal. No stridor. No respiratory distress. She has no wheezes. She has no rales. She exhibits no tenderness.  Abdominal: Soft. Bowel sounds are normal. She exhibits no distension and no mass. There is no tenderness. There is no rebound and no guarding.  Musculoskeletal: She exhibits no edema  or tenderness.  Neurological: She is alert and oriented to person, place, and time. Coordination normal.  Skin: Skin is warm and dry. No rash noted. No erythema. No pallor.  Psychiatric: She has a normal mood and affect. Her behavior is normal. Judgment and thought content normal.  Nursing note and vitals reviewed.   ED Course  Procedures (including critical care time) Labs Review Labs Reviewed - No data to display  Imaging Review No results found. I have personally reviewed and evaluated these images and lab results as part of my medical decision-making.   EKG Interpretation None      MDM   Final diagnoses:  Non-intractable vomiting without nausea, vomiting of unspecified type    Labs:  Urinalysis, CMP, Lipase, CBC-  K 3.1,  ast 76, alk 204 ( improved from previous of 246)   Imaging: CT abdomen and pelvis  Consults:  Therapeutics: Potassium  Discharge Meds:   Assessment/Plan: 54 year old female with a history of C. difficile colitis, nausea vomiting, diarrhea presents today with nausea and vomiting. She was given Zofran prior to arrival, she had no episodes of vomiting here in the ED, normal bowel movements. Patient did have elevated white count at 20,000. Lung sounds were clear, no cough, no upper respiratory symptoms, she had no abdominal pain. Patient was nontoxic in no acute distress, vital signs were reassuring, she was given fluid here in the ED, CT scan ordered to rule out abscess, diverticulitis, or any other concerning finding. Patient recently had a negative C. difficile, she's not having any diarrhea. She's been tolerating by mouth while here in the ED, patient care transferred to oncoming provider with likely discharge home pending CT results and by mouth intake.         Okey Regal, PA-C 10/02/15 0352  Harvel Quale, MD 10/02/15 [phone]

## 2015-10-01 NOTE — Progress Notes (Signed)
[facility] [address], [place] [address] [phone]  Date:  10/01/2015   Name:  Holly Phelps   DOB:  03/10/1961   MRN:  [id number]  PCP:  Kennon Portela, MD    History of Present Illness:  Holly Phelps is a 54 y.o. female patient with PMH of pancreatitis, recent c diff 3 weeks ago, alcohol and cocaine abuse who presents to [facility] for nausea and vomiting that started last night.  She has had 3-4 episodes of non-bloody emesis, with possible bilious emesis.  She reports abdominal pain that appears only secondary to her emesis.  No urinary symptoms, or bloody stool.  Stools are more formed at this time.  Hx of c diff colitis 3 weeks ago.  She recently had c diff test 4 days ago, which appears negative.  Lipase was negative as well. Patient has been practicing a more BRAT like diet since she was seen 4 days ago for nausea and diarrhea (where c diff, and stool cultures were tested).  States she has had 3-4 shots of bourbon per day last week.  Few sips of beer yesterday. Cocaine use last week.     Patient Active Problem List   Diagnosis Date Noted  . Hypomagnesemia 09/08/2015  . C. difficile colitis 09/07/2015  . Hypokalemia 09/07/2015  . Tobacco use disorder 09/07/2015  . Cocaine abuse 05/27/2015  . UTI (urinary tract infection) 04/24/2015  . Intractable vomiting with nausea 04/24/2015  . Pancreatitis 01/29/2013  . Anemia 01/29/2013  . Nausea vomiting and diarrhea 01/26/2013  . Esophagitis 01/26/2013  . HYPOTHYROIDISM 03/01/2008  . ANXIETY 03/01/2008  . Alcohol abuse 03/01/2008  . HYPERTENSION 03/01/2008  . LIVER FUNCTION TESTS, ABNORMAL, HX OF 03/01/2008    Past Medical History  Diagnosis Date  . Allergy   . Anemia   . 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 Hypertension   . Hypothyroidism   . GERD (gastroesophageal reflux disease)   . Headache     Past Surgical History  Procedure Laterality Date  . Laparoscopic appendectomy    . Back  surgery      x 2  . Colonoscopy N/A 03/13/2013    Procedure: COLONOSCOPY;  Surgeon: Leighton Ruff, MD;  Location: [facility];  Service: Endoscopy;  Laterality: N/A;    Social History  Substance Use Topics  . Smoking status: Current Every Day Smoker -- 0.50 packs/day    Types: Cigarettes  . Smokeless tobacco: None     Comment: 3 cigarettes per day  . Alcohol Use: 6.0 oz/week    10 Glasses of wine per week     Comment: occasional    Family History  Problem Relation Age of Onset  . Stroke Father   . Stroke Brother   . Stroke Brother     Allergies  Allergen Reactions  . Codeine Nausea And Vomiting  . Flexeril [Cyclobenzaprine] Itching  . Sulfa Antibiotics     Pt does not recall what the reaction was,been too long  . Synthroid [Levothyroxine] Rash    Medication list has been reviewed and updated.  Current Outpatient Prescriptions on File Prior to Visit  Medication Sig Dispense Refill  . Cranberry 500 MG CHEW Chew 1 tablet by mouth 2 (two) times daily.    Marland Kitchen docusate sodium (COLACE) 100 MG capsule Take 100 mg by mouth daily.    . ferrous gluconate (FERGON) 240 (27 FE) MG tablet Take 240 mg by mouth daily.    Marland Kitchen  ibuprofen (ADVIL,MOTRIN) 200 MG tablet Take 200-400 mg by mouth every 6 (six) hours as needed for moderate pain.    . Multiple Vitamin (MULTIVITAMIN WITH MINERALS) TABS Take 1 tablet by mouth daily. 30 tablet 0  . ondansetron (ZOFRAN ODT) 4 MG disintegrating tablet Take 1 tablet (4 mg total) by mouth every 8 (eight) hours as needed for nausea or vomiting. 20 tablet 0  . thiamine 100 MG tablet Take 1 tablet (100 mg total) by mouth daily. 30 tablet 0   No current facility-administered medications on file prior to visit.    ROS ROS OTHERWISE UNREMARKABLE UNLESS LISTED ABOVE.   Physical Examination: BP 100/76 mmHg  Pulse 131  Temp(Src) 98.4 F (36.9 C) (Oral)  Resp 16  Ht 5\' 2"  (1.575 m)  Wt 96 lb 3.2 oz (43.636 kg)  BMI 17.59 kg/m2 Ideal Body Weight: Weight in  (lb) to have BMI = 25: 136.4 Wt Readings from Last 3 Encounters:  10/01/15 96 lb 3.2 oz (43.636 kg)  09/27/15 101 lb 12.8 oz (46.176 kg)  09/08/15 98 lb 1.7 oz (44.5 kg)    Physical Exam  Constitutional: She is oriented to person, place, and time. She appears well-developed and well-nourished. No distress.  HENT:  Head: Normocephalic and atraumatic.  Right Ear: External ear normal.  Left Ear: External ear normal.  Eyes: Conjunctivae and EOM are normal. Pupils are equal, round, and reactive to light.  Cardiovascular: Regular rhythm.  Tachycardia present.  Exam reveals no gallop and no friction rub.   No murmur heard. Pulses:      Radial pulses are 2+ on the right side, and 2+ on the left side.  Pulmonary/Chest: Effort normal. No respiratory distress.  Abdominal: Soft. Normal appearance and bowel sounds are normal. There is no hepatosplenomegaly. There is no tenderness. There is no tenderness at McBurney's point and negative Murphy's sign.  Neurological: She is alert and oriented to person, place, and time.  Skin: She is not diaphoretic.  Psychiatric: She has a normal mood and affect. Her behavior is normal.   Results for orders placed or performed in visit on 10/01/15  POCT CBC  Result Value Ref Range   WBC 19.1 (A) 4.6 - 10.2 K/uL   Lymph, poc 4.4 (A) 0.6 - 3.4   POC LYMPH PERCENT 22.9 10 - 50 %L   MID (cbc) 0.3 0 - 0.9   POC MID % 1.6 0 - 12 %M   POC Granulocyte 14.4 (A) 2 - 6.9   Granulocyte percent 75.5 37 - 80 %G   RBC 2.72 (A) 4.04 - 5.48 M/uL   Hemoglobin 8.9 (A) 12.2 - 16.2 g/dL   HCT, POC 26.4 (A) 37.7 - 47.9 %   MCV 97.3 (A) 80 - 97 fL   MCH, POC 32.7 (A) 27 - 31.2 pg   MCHC 33.6 31.8 - 35.4 g/dL   RDW, POC 16.2 %   Platelet Count, POC 341 142 - 424 K/uL   MPV 6.3 0 - 99.8 fL   Orthostatic VS for the past 24 hrs:  BP- Lying Pulse- Lying BP- Sitting Pulse- Sitting BP- Standing at 0 minutes Pulse- Standing at 0 minutes  10/01/15 0953 111/79 mmHg 128 118/81 mmHg  130 115/81 mmHg 137      Assessment and Plan: Holly Phelps is a 54 y.o. female who is here today for nausea vomiting and weakness for the since last night. Recent c diff is negative 4 days, ago though can not exclude.  Possible  pancreatitis (hemorrhagic). She is tachycardic and weak anemic, and infectious that has not restored.  She will need further care at the ED. -I have asked for EMS transport, but she is firmly declining.  Discussed with husband her current state.  He too declines EMS stating that he will transport to [facility]. Charge nurse notified of her arrival.  1. Nausea and vomiting, intractability of vomiting not specified, unspecified vomiting type - COMPLETE METABOLIC PANEL WITH GFR - POCT CBC - Lipase   Ivar Drape, PA-C [facility] 10/01/2015 11:49 AM

## 2015-10-01 NOTE — Patient Instructions (Signed)
Please head straight to [facility] at this time.  This is very important.  I have alerted the charge nurse of your arrival.

## 2015-10-01 NOTE — ED Notes (Signed)
Patient is vomiting

## 2015-10-01 NOTE — ED Notes (Signed)
She c/o frequent n/v since yesterday evening.

## 2015-10-02 ENCOUNTER — Encounter: Payer: Self-pay | Admitting: Physician Assistant

## 2015-10-03 LAB — OVA AND PARASITE EXAMINATION: OP: NONE SEEN

## 2015-10-04 LAB — STOOL CULTURE

## 2015-10-31 IMAGING — US US ABDOMEN COMPLETE
1 series · 14 of 25 positions shown · non-contrast
Comparison: [DATE]

CLINICAL DATA: Right upper quadrant pain, initial encounter

EXAM:
ABDOMEN ULTRASOUND COMPLETE

[Series 1: us abdomen complete · 0.17mm/px · 14 of 106 slices shown]
[im 1/106]
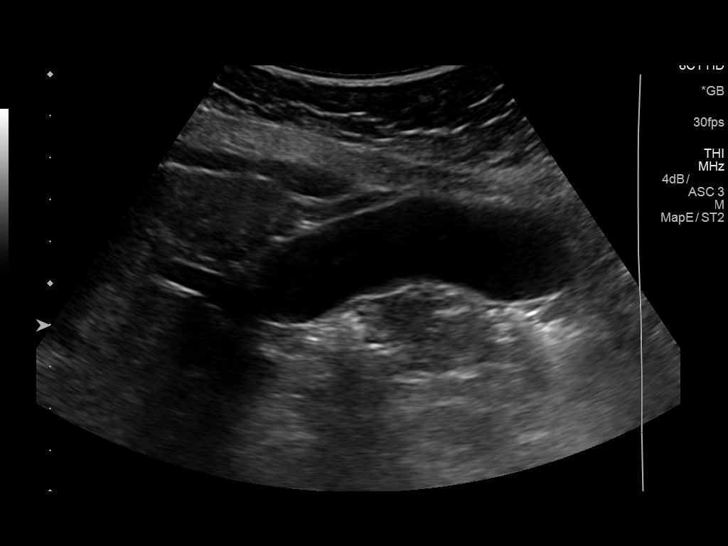
[im 9/106]
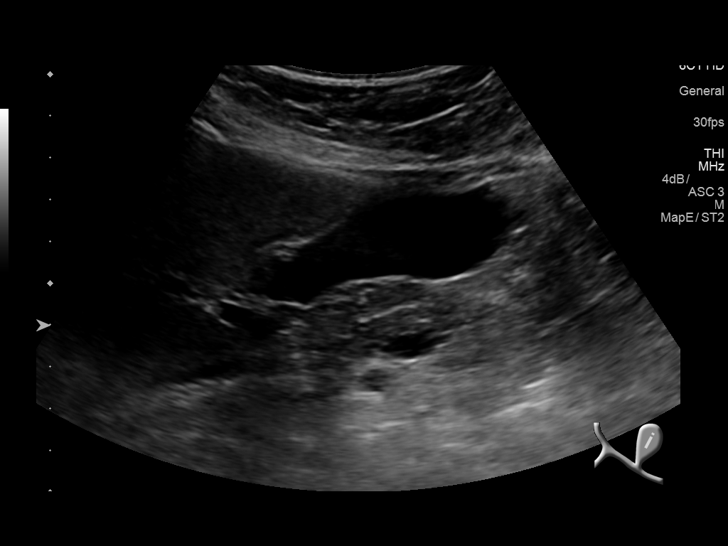
[im 18/106]
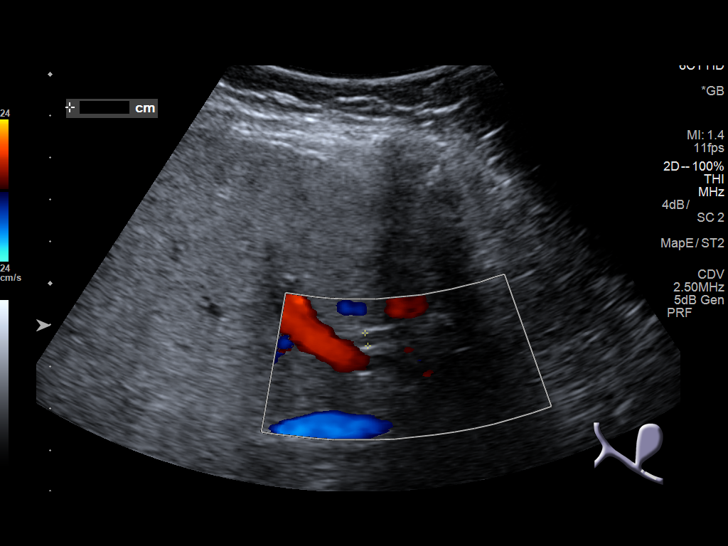
[im 27/106]
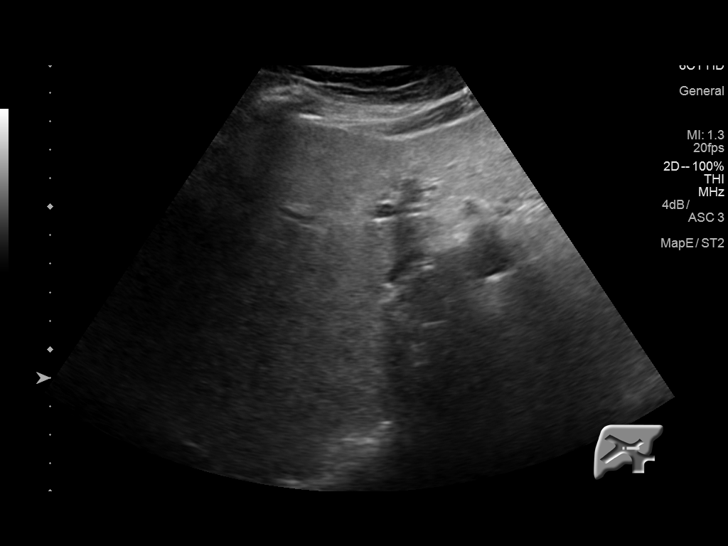
[im 36/106]
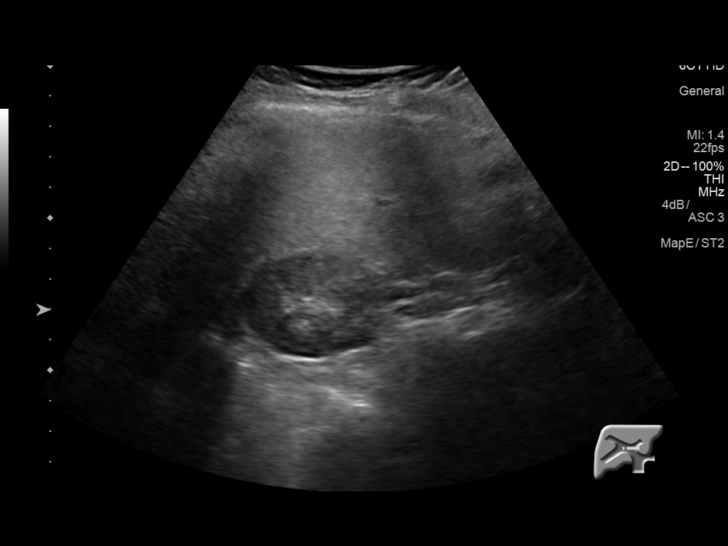
[im 40/106]
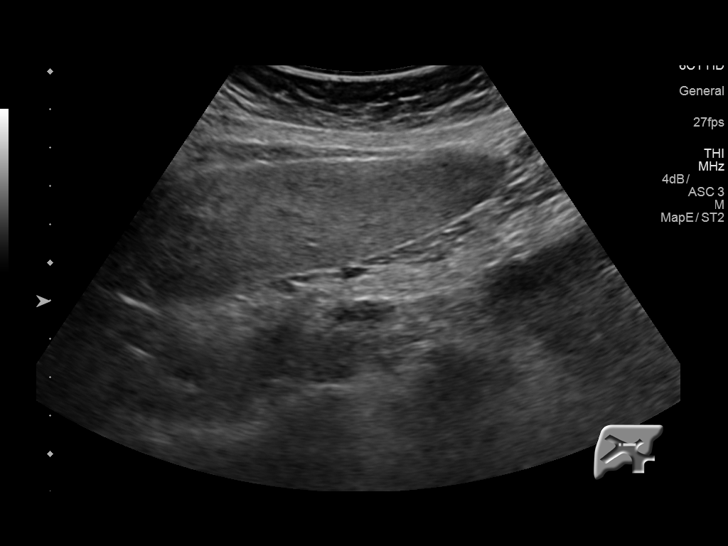
[im 49/106]
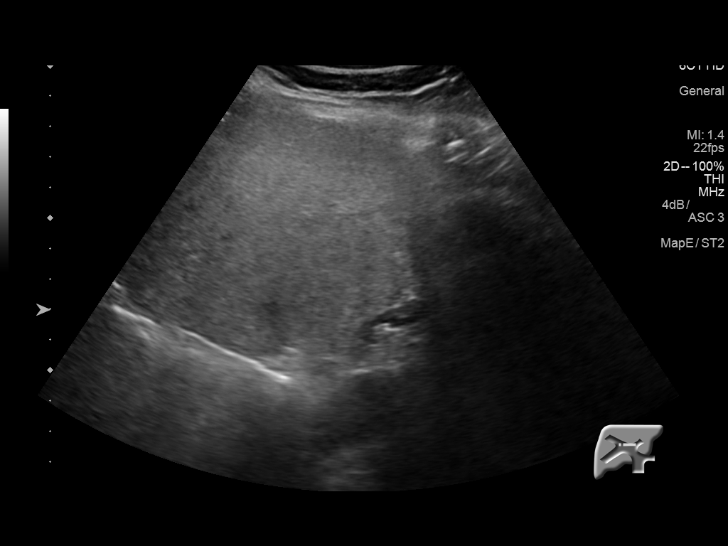
[im 57/106]
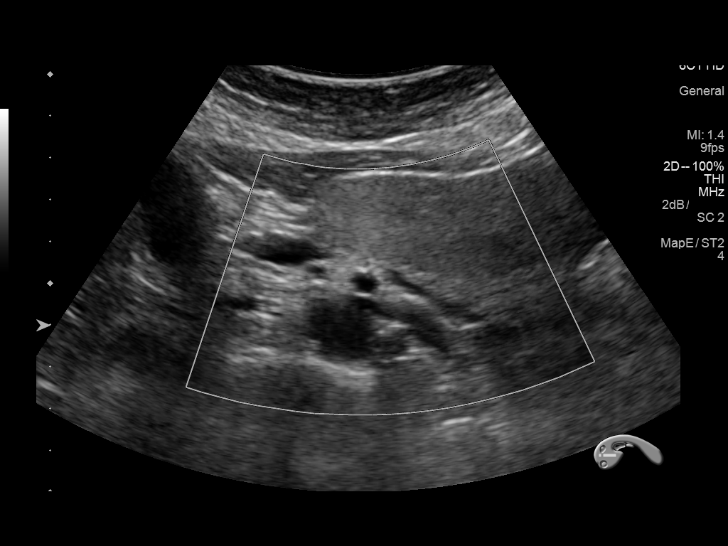
[im 66/106]
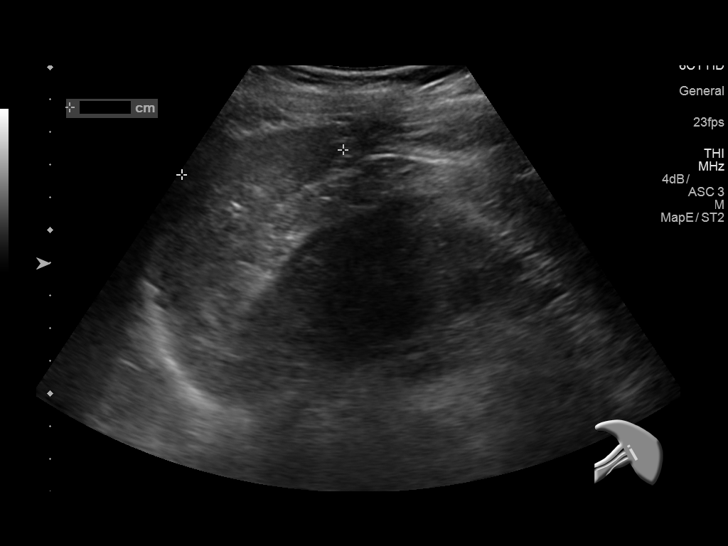
[im 71/106]
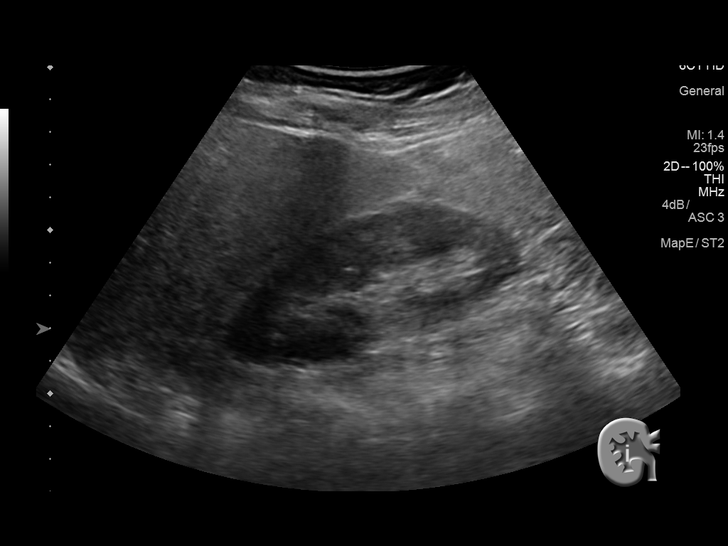
[im 79/106]
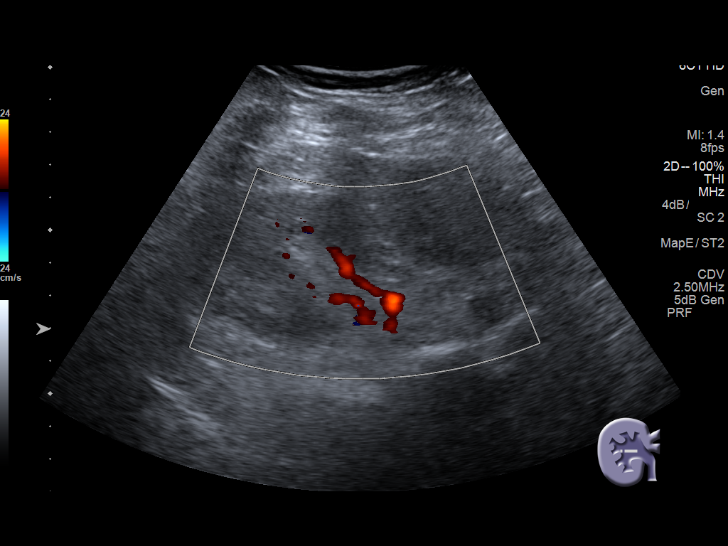
[im 88/106]
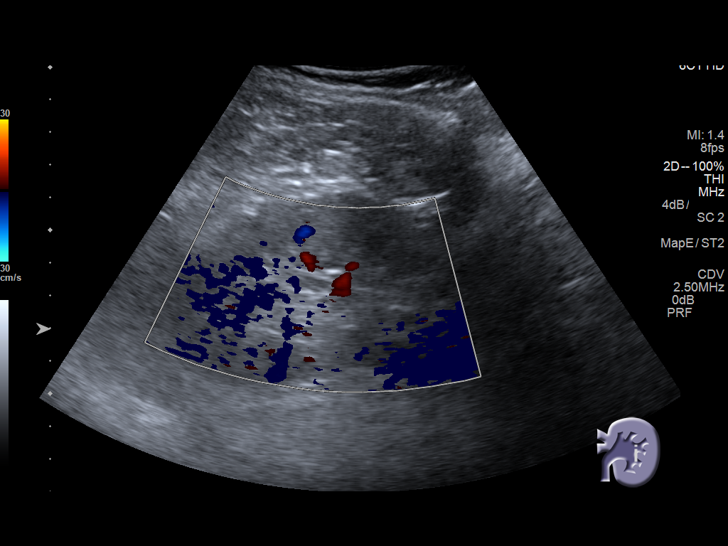
[im 97/106]
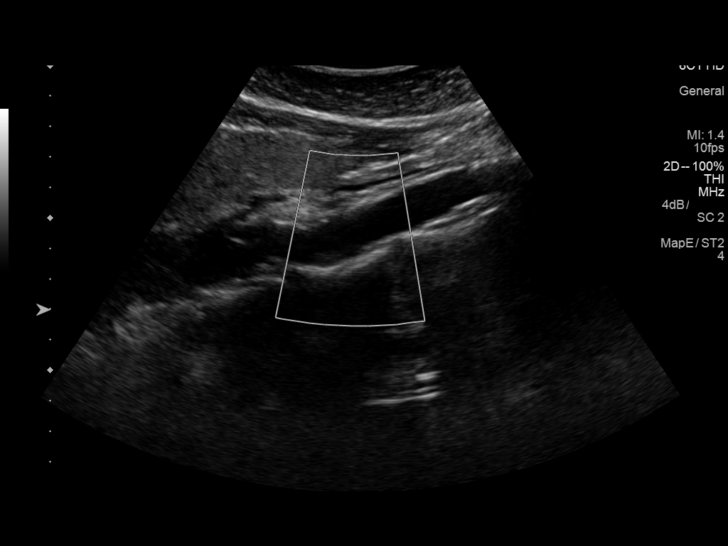
[im 106/106]
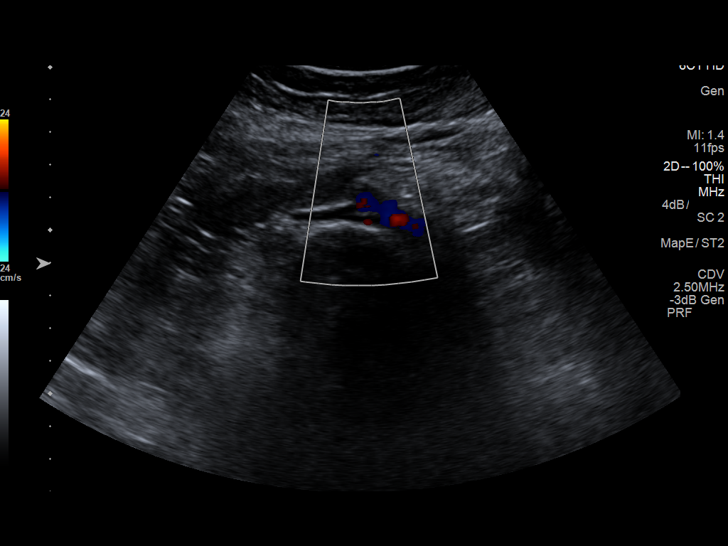

[14 of 25 positions shown; findings below may reference images not displayed]

FINDINGS: Gallbladder: No gallstones or wall thickening visualized. No
sonographic Murphy sign noted by sonographer.

Common bile duct: Diameter: 4 mm

Liver: Increased in echogenicity consistent with fatty infiltration.

IVC: No abnormality visualized.

Pancreas: Visualized portion unremarkable.

Spleen: Size and appearance within normal limits.

Right Kidney: Length: 5.8 cm.. Echogenicity within normal limits. No
mass or hydronephrosis visualized.

Left Kidney: Length: 10.4 cm.. Echogenicity within normal limits. No
mass or hydronephrosis visualized.

Abdominal aorta: Atherosclerotic changes are noted without
aneurysmal dilatation.

Other findings: None.
IMPRESSION: Fatty liver.

No acute abnormality noted.

## 2015-11-16 ENCOUNTER — Ambulatory Visit (INDEPENDENT_AMBULATORY_CARE_PROVIDER_SITE_OTHER): Payer: BLUE CROSS/BLUE SHIELD | Admitting: Physician Assistant

## 2015-11-16 ENCOUNTER — Encounter (HOSPITAL_COMMUNITY): Payer: Self-pay | Admitting: *Deleted

## 2015-11-16 ENCOUNTER — Inpatient Hospital Stay (HOSPITAL_COMMUNITY)
Admission: EM | Admit: 2015-11-16 | Discharge: 2015-11-19 | DRG: 371 | Disposition: A | Discharge status: Home / Self Care | Payer: BLUE CROSS/BLUE SHIELD | Attending: Internal Medicine | Admitting: Internal Medicine

## 2015-11-16 ENCOUNTER — Emergency Department (HOSPITAL_COMMUNITY): Payer: BLUE CROSS/BLUE SHIELD

## 2015-11-16 VITALS — BP 110/60 | HR 120 | Temp 98.0°F | Resp 16 | Ht 62.0 in | Wt 90.8 lb

## 2015-11-16 DIAGNOSIS — E860 Dehydration: Secondary | ICD-10-CM | POA: Diagnosis present

## 2015-11-16 DIAGNOSIS — R5383 Other fatigue: Secondary | ICD-10-CM

## 2015-11-16 DIAGNOSIS — D638 Anemia in other chronic diseases classified elsewhere: Secondary | ICD-10-CM | POA: Diagnosis present

## 2015-11-16 DIAGNOSIS — R112 Nausea with vomiting, unspecified: Secondary | ICD-10-CM | POA: Diagnosis not present

## 2015-11-16 DIAGNOSIS — Z79899 Other long term (current) drug therapy: Secondary | ICD-10-CM

## 2015-11-16 DIAGNOSIS — A047 Enterocolitis due to Clostridium difficile: Principal | ICD-10-CM | POA: Diagnosis present

## 2015-11-16 DIAGNOSIS — E43 Unspecified severe protein-calorie malnutrition: Secondary | ICD-10-CM | POA: Diagnosis present

## 2015-11-16 DIAGNOSIS — R531 Weakness: Secondary | ICD-10-CM | POA: Diagnosis not present

## 2015-11-16 DIAGNOSIS — A0471 Enterocolitis due to Clostridium difficile, recurrent: Secondary | ICD-10-CM | POA: Diagnosis present

## 2015-11-16 DIAGNOSIS — I700 Atherosclerosis of aorta: Secondary | ICD-10-CM | POA: Diagnosis present

## 2015-11-16 DIAGNOSIS — E876 Hypokalemia: Secondary | ICD-10-CM | POA: Diagnosis present

## 2015-11-16 DIAGNOSIS — D539 Nutritional anemia, unspecified: Secondary | ICD-10-CM | POA: Diagnosis present

## 2015-11-16 DIAGNOSIS — F17200 Nicotine dependence, unspecified, uncomplicated: Secondary | ICD-10-CM | POA: Diagnosis present

## 2015-11-16 DIAGNOSIS — F17210 Nicotine dependence, cigarettes, uncomplicated: Secondary | ICD-10-CM | POA: Diagnosis present

## 2015-11-16 DIAGNOSIS — I10 Essential (primary) hypertension: Secondary | ICD-10-CM | POA: Diagnosis present

## 2015-11-16 DIAGNOSIS — R197 Diarrhea, unspecified: Secondary | ICD-10-CM

## 2015-11-16 DIAGNOSIS — D72829 Elevated white blood cell count, unspecified: Secondary | ICD-10-CM | POA: Diagnosis present

## 2015-11-16 DIAGNOSIS — E872 Acidosis: Secondary | ICD-10-CM | POA: Diagnosis present

## 2015-11-16 DIAGNOSIS — Z823 Family history of stroke: Secondary | ICD-10-CM

## 2015-11-16 DIAGNOSIS — R1084 Generalized abdominal pain: Secondary | ICD-10-CM

## 2015-11-16 DIAGNOSIS — Z681 Body mass index (BMI) 19 or less, adult: Secondary | ICD-10-CM

## 2015-11-16 DIAGNOSIS — R110 Nausea: Secondary | ICD-10-CM

## 2015-11-16 HISTORY — DX: Fatty (change of) liver, not elsewhere classified: K76.0

## 2015-11-16 HISTORY — DX: Atherosclerosis of aorta: I70.0

## 2015-11-16 LAB — URINALYSIS, ROUTINE W REFLEX MICROSCOPIC
Glucose, UA: NEGATIVE mg/dL
Hgb urine dipstick: NEGATIVE
Ketones, ur: >80 mg/dL — AB
Leukocytes, UA: NEGATIVE
Nitrite: NEGATIVE
Protein, ur: NEGATIVE mg/dL
Specific Gravity, Urine: 1.016 (ref 1.005–1.030)
pH: 6 (ref 5.0–8.0)

## 2015-11-16 LAB — COMPREHENSIVE METABOLIC PANEL
ALT: 30 U/L — ABNORMAL HIGH (ref 6–29)
ALT: 33 U/L (ref 14–54)
AST: 111 U/L — ABNORMAL HIGH (ref 10–35)
AST: 120 U/L — ABNORMAL HIGH (ref 15–41)
Albumin: 3.4 g/dL — ABNORMAL LOW (ref 3.6–5.1)
Albumin: 3.5 g/dL (ref 3.5–5.0)
Alkaline Phosphatase: 278 U/L — ABNORMAL HIGH (ref 33–130)
Alkaline Phosphatase: 290 U/L — ABNORMAL HIGH (ref 38–126)
Anion gap: 21 — ABNORMAL HIGH (ref 5–15)
BUN: 2 mg/dL — ABNORMAL LOW (ref 7–25)
BUN: <5 mg/dL — ABNORMAL LOW (ref 6–20)
CO2: 16 mmol/L — ABNORMAL LOW (ref 20–31)
CO2: 14 mmol/L — ABNORMAL LOW (ref 22–32)
Calcium: 8.2 mg/dL — ABNORMAL LOW (ref 8.6–10.4)
Calcium: 8.4 mg/dL — ABNORMAL LOW (ref 8.9–10.3)
Chloride: 91 mmol/L — ABNORMAL LOW (ref 98–110)
Chloride: 94 mmol/L — ABNORMAL LOW (ref 101–111)
Creat: 0.52 mg/dL (ref 0.50–1.05)
Creatinine, Ser: 0.66 mg/dL (ref 0.44–1.00)
GFR calc Af Amer: >60 mL/min (ref >60)
GFR calc non Af Amer: >60 mL/min (ref >60)
Glucose, Bld: 87 mg/dL (ref 65–99)
Glucose, Bld: 100 mg/dL — ABNORMAL HIGH (ref 65–99)
Potassium: 3.6 mmol/L (ref 3.5–5.3)
Potassium: 3.3 mmol/L — ABNORMAL LOW (ref 3.5–5.1)
Sodium: 132 mmol/L — ABNORMAL LOW (ref 135–146)
Sodium: 129 mmol/L — ABNORMAL LOW (ref 135–145)
Total Bilirubin: 0.5 mg/dL (ref 0.2–1.2)
Total Bilirubin: 1.2 mg/dL (ref 0.3–1.2)
Total Protein: 6.6 g/dL (ref 6.1–8.1)
Total Protein: 7.7 g/dL (ref 6.5–8.1)

## 2015-11-16 LAB — I-STAT CG4 LACTIC ACID, ED: Lactic Acid, Venous: 0.63 mmol/L (ref 0.5–2.0)

## 2015-11-16 LAB — CBC WITH DIFFERENTIAL/PLATELET
Basophils Absolute: 0 10*3/uL (ref 0.0–0.1)
Basophils Relative: 0 %
Eosinophils Absolute: 0 10*3/uL (ref 0.0–0.7)
Eosinophils Relative: 0 %
HCT: 30.4 % — ABNORMAL LOW (ref 36.0–46.0)
Hemoglobin: 10.2 g/dL — ABNORMAL LOW (ref 12.0–15.0)
Lymphocytes Relative: 7 %
Lymphs Abs: 1.6 10*3/uL (ref 0.7–4.0)
MCH: 34.9 pg — ABNORMAL HIGH (ref 26.0–34.0)
MCHC: 33.6 g/dL (ref 30.0–36.0)
MCV: 104.1 fL — ABNORMAL HIGH (ref 78.0–100.0)
Monocytes Absolute: 2.1 10*3/uL — ABNORMAL HIGH (ref 0.1–1.0)
Monocytes Relative: 9 %
Neutro Abs: 19.1 10*3/uL — ABNORMAL HIGH (ref 1.7–7.7)
Neutrophils Relative %: 84 %
Platelets: 451 10*3/uL — ABNORMAL HIGH (ref 150–400)
RBC: 2.92 MIL/uL — ABNORMAL LOW (ref 3.87–5.11)
RDW: 15.3 % (ref 11.5–15.5)
WBC: 22.8 10*3/uL — ABNORMAL HIGH (ref 4.0–10.5)

## 2015-11-16 LAB — PREGNANCY, URINE: Preg Test, Ur: NEGATIVE

## 2015-11-16 LAB — AMYLASE: Amylase: 18 U/L (ref 0–105)

## 2015-11-16 LAB — POCT CBC
Granulocyte percent: 87 %G — AB (ref 37–80)
HCT, POC: 28.3 % — AB (ref 37.7–47.9)
Hemoglobin: 9.6 g/dL — AB (ref 12.2–16.2)
Lymph, poc: 2.5 (ref 0.6–3.4)
MCH, POC: 34.6 pg — AB (ref 27–31.2)
MCHC: 34.1 g/dL (ref 31.8–35.4)
MCV: 101.5 fL — AB (ref 80–97)
MID (cbc): 0.4 (ref 0–0.9)
MPV: 5.5 fL (ref 0–99.8)
POC Granulocyte: 19.1 — AB (ref 2–6.9)
POC LYMPH PERCENT: 11.4 %L (ref 10–50)
POC MID %: 1.6 %M (ref 0–12)
Platelet Count, POC: 448 10*3/uL — AB (ref 142–424)
RBC: 2.79 M/uL — AB (ref 4.04–5.48)
RDW, POC: 16.9 %
WBC: 22 10*3/uL — AB (ref 4.6–10.2)

## 2015-11-16 LAB — POCT GLYCOSYLATED HEMOGLOBIN (HGB A1C): Hemoglobin A1C: 4.6

## 2015-11-16 LAB — GLUCOSE, POCT (MANUAL RESULT ENTRY)

## 2015-11-16 LAB — LIPASE, BLOOD: Lipase: 20 U/L (ref 11–51)

## 2015-11-16 LAB — LIPASE: Lipase: <5 U/L — ABNORMAL LOW (ref 7–60)

## 2015-11-16 IMAGING — CR DG ABDOMEN ACUTE W/ 1V CHEST
3 series · 3 of 3 positions shown · non-contrast
Comparison: CT dated [DATE]

CLINICAL DATA: 54-year-old female with abdominal pain and
leukocytosis

EXAM:
DG ABDOMEN ACUTE W/ 1V CHEST

[w chest pa]
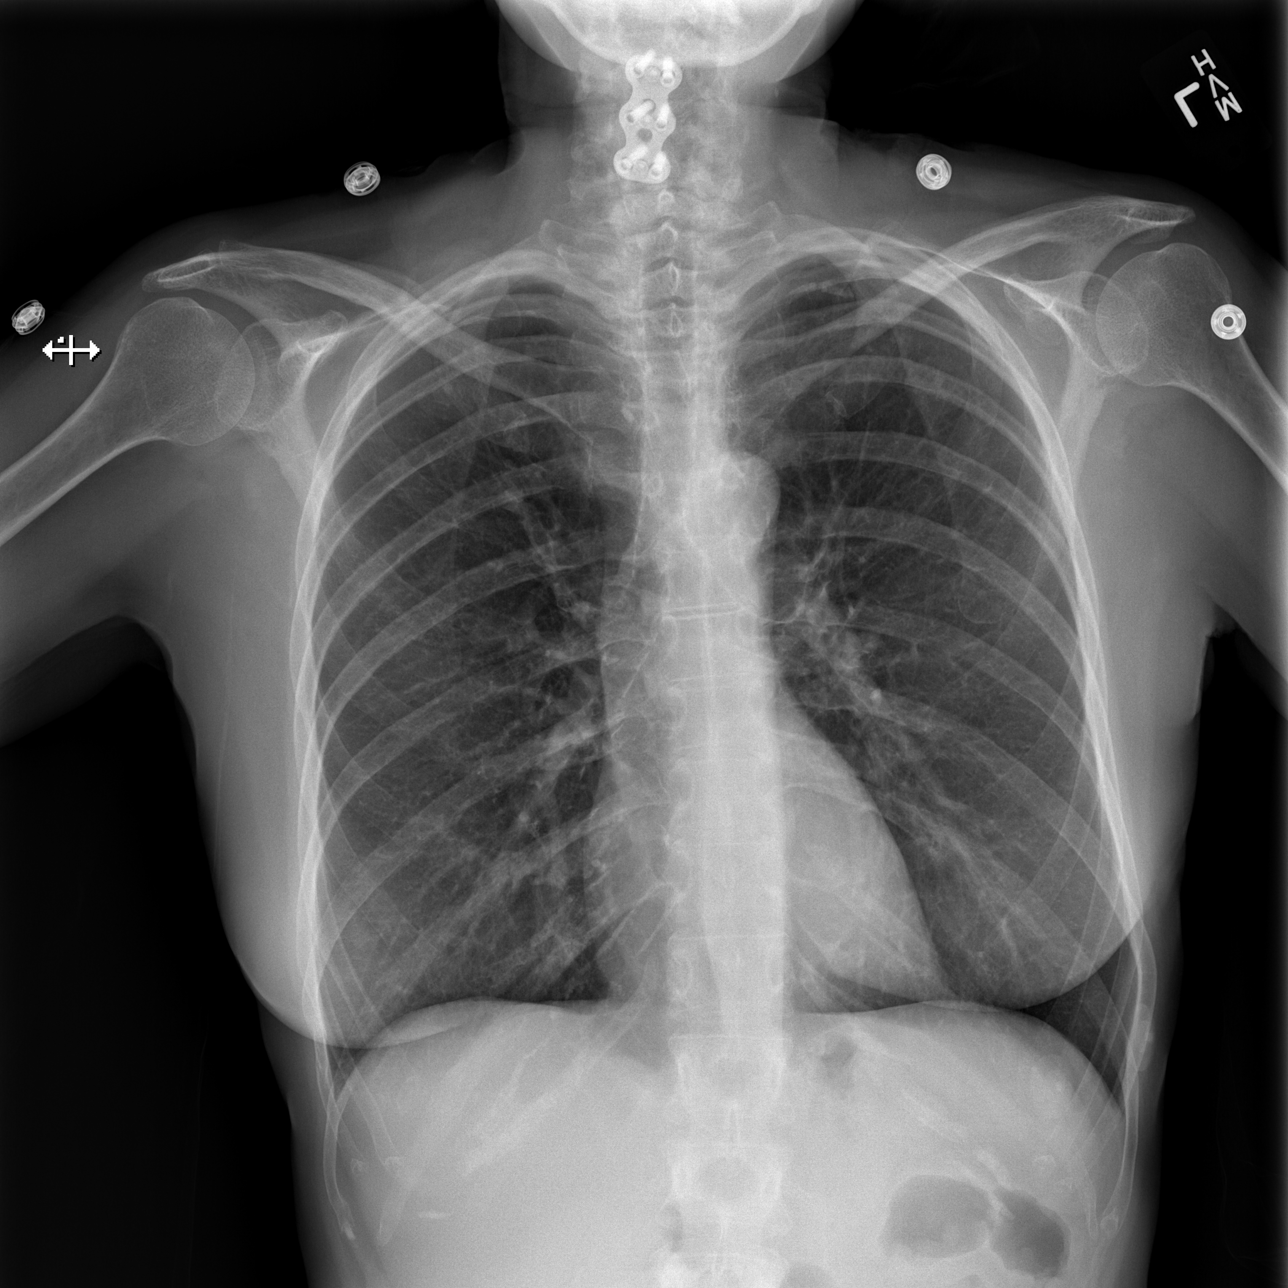

[w abdomen upright]
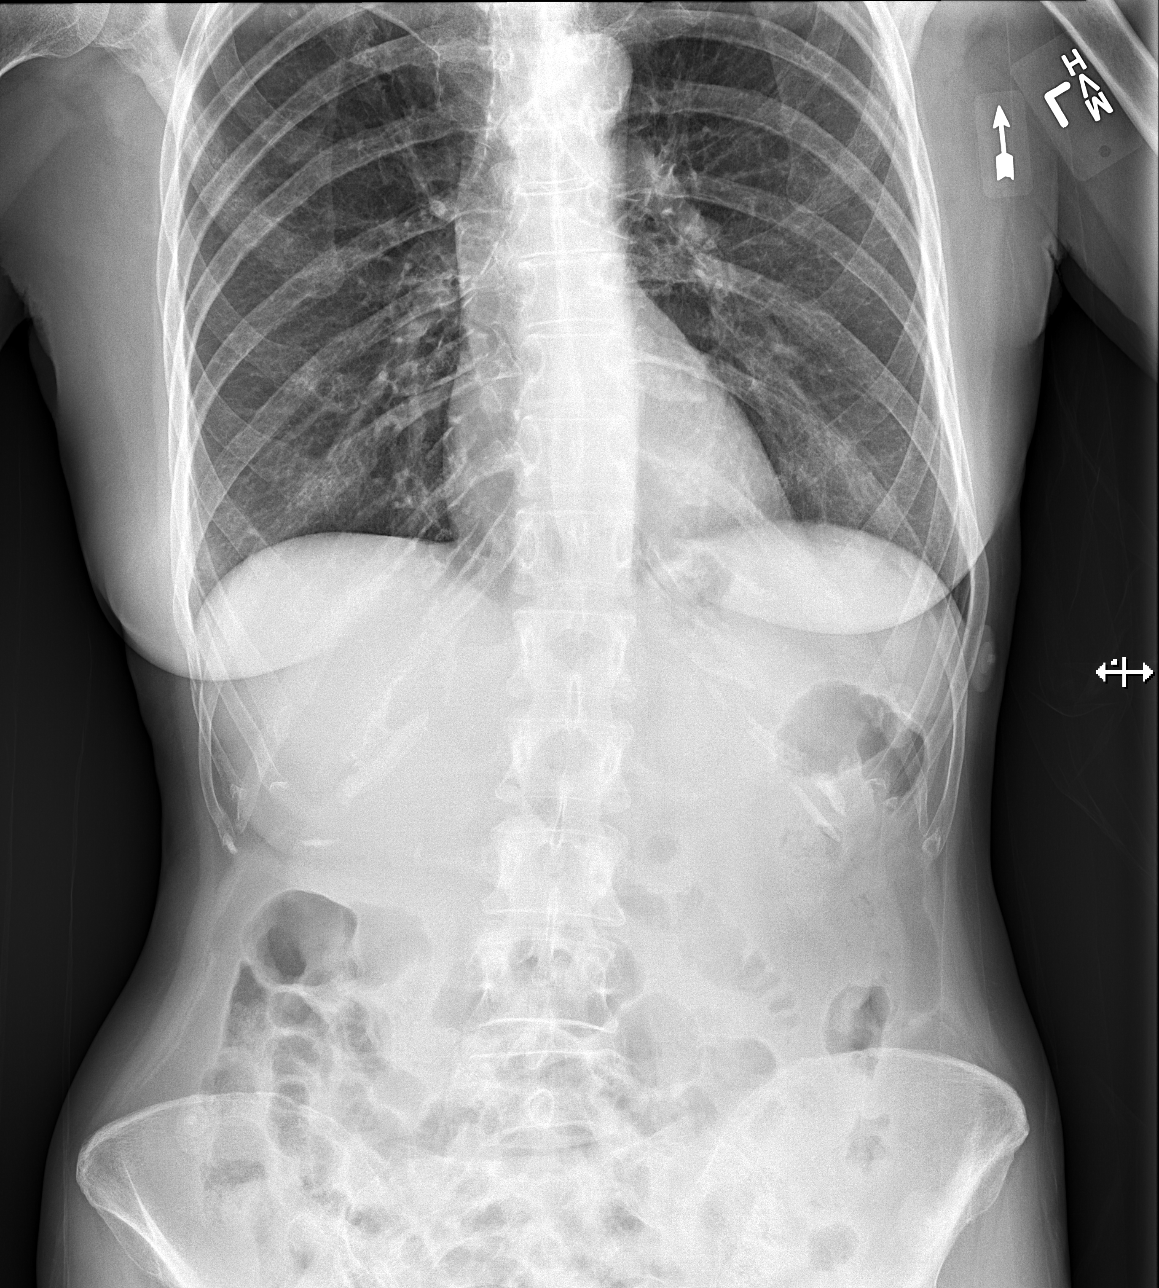

[t abdomen supine]
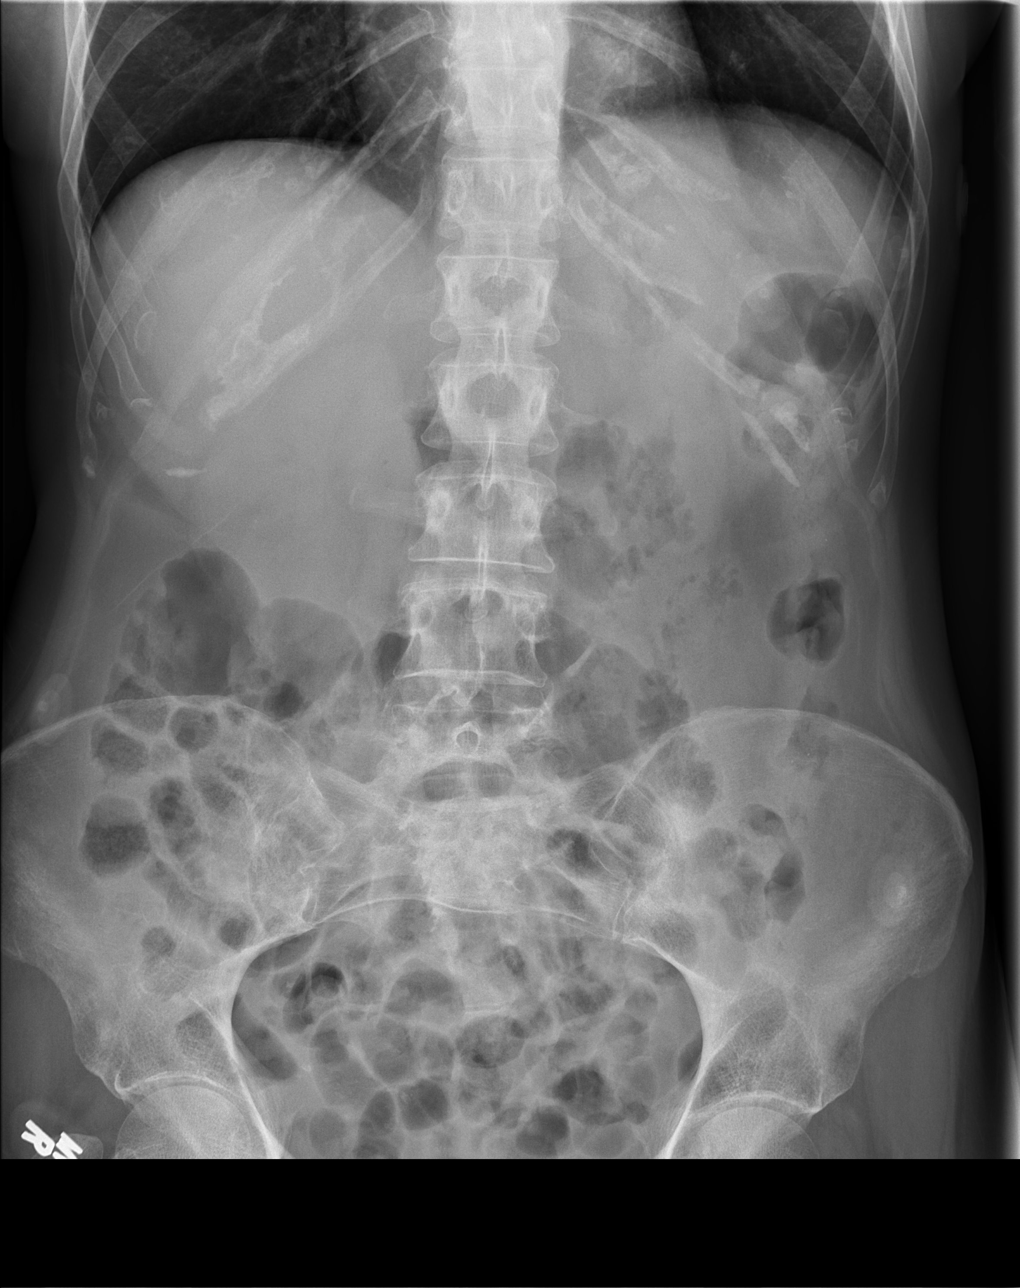

[3 of 3 positions shown; findings below may reference images not displayed]

FINDINGS: The lungs are clear. No pleural effusion or pneumothorax. The
osseous structures appear unremarkable. Cervical spine fixation
plate and screws.

There is no evidence of bowel obstruction. No radiopaque calculus or
foreign object. There is degenerative changes of the lower lumbar
spine. No acute fracture.
IMPRESSION: Negative abdominal radiographs.  No acute cardiopulmonary disease.

## 2015-11-16 MED ORDER — ONDANSETRON HCL 4 MG/2ML IJ SOLN
4.0000 mg | Freq: Once | INTRAMUSCULAR | Status: AC
  Administered 2015-11-16: 4 mg via INTRAVENOUS
  Filled 2015-11-16: qty 2

## 2015-11-16 MED ORDER — ONDANSETRON 4 MG PO TBDP
8.0000 mg | ORAL_TABLET | Freq: Once | ORAL | Status: AC
  Administered 2015-11-16: 8 mg via ORAL

## 2015-11-16 MED ORDER — SODIUM CHLORIDE 0.9 % IV BOLUS (SEPSIS)
1000.0000 mL | Freq: Once | INTRAVENOUS | Status: AC
  Administered 2015-11-16: 1000 mL via INTRAVENOUS

## 2015-11-16 MED ORDER — KETOROLAC TROMETHAMINE 30 MG/ML IJ SOLN
30.0000 mg | Freq: Once | INTRAMUSCULAR | Status: AC
  Administered 2015-11-17: 30 mg via INTRAVENOUS
  Filled 2015-11-16 (×2): qty 1

## 2015-11-16 MED ORDER — SODIUM CHLORIDE 0.9 % IV SOLN
1000.0000 mL | Freq: Once | INTRAVENOUS | Status: AC
  Administered 2015-11-16: 1000 mL via INTRAVENOUS

## 2015-11-16 MED ORDER — SODIUM CHLORIDE 0.9 % IV SOLN
1000.0000 mL | INTRAVENOUS | Status: DC
  Administered 2015-11-16: 1000 mL via INTRAVENOUS

## 2015-11-16 NOTE — Progress Notes (Signed)
Patient ID: Holly Phelps, female    DOB: [birth year], 54 y.o.   MRN: [id number]  PCP: Kennon Portela, MD  Subjective:   Chief Complaint  Patient presents with  . Emesis    began sunday   . low appitite since then  . Headache  . Nausea    HPI Presents for evaluation of nausea. She is accompanied by her husband.  Symptoms began 11/13/2015 with loss of appetite. That night, around 10 pm, developed nausea and vomiting, about every 30 minutes throughout the night. Last emesis was 11/14/2015, early morning. Nausea persists, comes on "in waves." No bloody emesis. Only part of a bowl of chicken noodle soup (yesterday) since 11/13/2015.Was able to keep it down. Drinking "a lot" of water. About 40 ounces in the past 24 hours. Reports frequent urination, normal color. No dysuria. No diarrhea. "I'm always cold." No fever/chills.  Mild abdominal pain and back pain and headache. "I think the belly pain is from throwing up so much." Zofran at home without benefit. Last dose was 11/14/2015.  This fall she was diagnosed with C diff colitis and Hepatitis B. Managed by [facility]. She has a history of pancreatitis, alcoholism and cocaine use. Denies any alcohol or illicit use that may have triggered her symptoms. No sick contacts. No unusual foods or recent travel. No spicy, high fat or citrus foods.  Review of Systems  Constitutional: Positive for appetite change and fatigue. Negative for fever, chills and diaphoresis.  HENT: Negative for congestion, sinus pressure, sore throat and voice change.   Eyes: Negative for pain and visual disturbance.  Respiratory: Negative for cough, chest tightness and shortness of breath.   Cardiovascular: Negative for chest pain, palpitations and leg swelling.  Gastrointestinal: Positive for nausea, vomiting (none x 24 hours) and abdominal pain (mild). Negative for diarrhea, constipation (reports normal, brown, soft BMs; most recently this morning),  blood in stool, abdominal distention, anal bleeding and rectal pain.  Endocrine: Positive for cold intolerance. Negative for heat intolerance, polydipsia, polyphagia and polyuria.  Genitourinary: Negative for dysuria, urgency, frequency, hematuria and flank pain.  Musculoskeletal: Positive for back pain (mild). Negative for arthralgias.  Skin: Negative for rash and wound.  Allergic/Immunologic: Positive for immunocompromised state.  Neurological: Positive for weakness. Negative for dizziness and headaches.       Patient Active Problem List   Diagnosis Date Noted  . Hepatitis B 11/16/2015  . Hypomagnesemia 09/08/2015  . C. difficile colitis 09/07/2015  . Hypokalemia 09/07/2015  . Tobacco use disorder 09/07/2015  . Cocaine abuse 05/27/2015  . UTI (urinary tract infection) 04/24/2015  . Intractable vomiting with nausea 04/24/2015  . Pancreatitis 01/29/2013  . Anemia 01/29/2013  . Nausea vomiting and diarrhea 01/26/2013  . Esophagitis 01/26/2013  . HYPOTHYROIDISM 03/01/2008  . ANXIETY 03/01/2008  . Alcohol abuse 03/01/2008  . HYPERTENSION 03/01/2008  . LIVER FUNCTION TESTS, ABNORMAL, HX OF 03/01/2008     Prior to Admission medications   Medication Sig Start Date End Date Taking? Authorizing Provider  Cranberry 500 MG CHEW Chew 1 tablet by mouth 2 (two) times daily.   Yes Historical Provider, MD  ferrous gluconate (FERGON) 240 (27 FE) MG tablet Take 240 mg by mouth daily.   Yes Historical Provider, MD  ibuprofen (ADVIL,MOTRIN) 200 MG tablet Take 200-400 mg by mouth every 6 (six) hours as needed for moderate pain.   Yes Historical Provider, MD  Multiple Vitamin (MULTIVITAMIN WITH MINERALS) TABS Take 1 tablet by mouth daily. 01/29/13  Yes  [name], MD  ondansetron (ZOFRAN ODT) 4 MG disintegrating tablet Take 1 tablet (4 mg total) by mouth every 8 (eight) hours as needed for nausea or vomiting. 09/27/15  Yes Kulzer Scrape V, PA-C  ondansetron (ZOFRAN) 4 MG tablet Take 1 tablet (4  mg total) by mouth every 6 (six) hours. 10/01/15  Yes Hanna Patel-Mills, PA-C  thiamine 100 MG tablet Take 1 tablet (100 mg total) by mouth daily. 04/27/15  Yes [name], DO     Allergies  Allergen Reactions  . Codeine Nausea And Vomiting  . Flexeril [Cyclobenzaprine] Itching  . Sulfa Antibiotics     Pt does not recall what the reaction was,been too long  . Synthroid [Levothyroxine] Rash       Objective:  Physical Exam  Constitutional: She is oriented to person, place, and time. Vital signs are normal. She appears well-developed and well-nourished. She is active and cooperative.  Non-toxic appearance. She has a sickly appearance. No distress.  BP 110/60 mmHg  Pulse 120  Temp(Src) 98 F (36.7 C) (Oral)  Resp 16  Ht 5\' 2"  (1.575 m)  Wt 90 lb 12.8 oz (41.187 kg)  BMI 16.60 kg/m2  SpO2 98%  HENT:  Head: Normocephalic and atraumatic.  Right Ear: Hearing, tympanic membrane, external ear and ear canal normal.  Left Ear: Hearing, tympanic membrane, external ear and ear canal normal.  Nose: Nose normal.  Mouth/Throat: Uvula is midline, oropharynx is clear and moist and mucous membranes are normal. No oral lesions.  Eyes: Conjunctivae, EOM and lids are normal. Pupils are equal, round, and reactive to light. No scleral icterus.  Neck: Normal range of motion, full passive range of motion without pain and phonation normal. Neck supple. No thyromegaly present.  Cardiovascular: Regular rhythm, normal heart sounds and intact distal pulses.  Tachycardia present.   Pulses:      Radial pulses are 2+ on the right side, and 2+ on the left side.  Pulmonary/Chest: Effort normal and breath sounds normal.  Abdominal: Normal appearance and bowel sounds are normal. She exhibits no shifting dullness, no distension, no pulsatile liver, no ascites and no mass. There is no hepatosplenomegaly. There is tenderness (very mild, generalized, but NOT in the epigastrum). There is no CVA tenderness.    Lymphadenopathy:       Head (right side): No tonsillar, no preauricular, no posterior auricular and no occipital adenopathy present.       Head (left side): No tonsillar, no preauricular, no posterior auricular and no occipital adenopathy present.    She has no cervical adenopathy.       Right: No supraclavicular adenopathy present.       Left: No supraclavicular adenopathy present.  Neurological: She is alert and oriented to person, place, and time. No sensory deficit.  Skin: Skin is warm, dry and intact. No rash noted. No cyanosis or erythema. Nails show no clubbing.  Psychiatric: She has a normal mood and affect. Her speech is normal and behavior is normal.    Results for orders placed or performed in visit on 11/16/15  POCT CBC  Result Value Ref Range   WBC 22.0 (A) 4.6 - 10.2 K/uL   Lymph, poc 2.5 0.6 - 3.4   POC LYMPH PERCENT 11.4 10 - 50 %L   MID (cbc) 0.4 0 - 0.9   POC MID % 1.6 0 - 12 %M   POC Granulocyte 19.1 (A) 2 - 6.9   Granulocyte percent 87.0 (A) 37 - 80 %G  RBC 2.79 (A) 4.04 - 5.48 M/uL   Hemoglobin 9.6 (A) 12.2 - 16.2 g/dL   HCT, POC 28.3 (A) 37.7 - 47.9 %   MCV 101.5 (A) 80 - 97 fL   MCH, POC 34.6 (A) 27 - 31.2 pg   MCHC 34.1 31.8 - 35.4 g/dL   RDW, POC 16.9 %   Platelet Count, POC 448 (A) 142 - 424 K/uL   MPV 5.5 0 - 99.8 fL  POCT glucose (manual entry)  Result Value Ref Range   POC Glucose 108` 70 - 99 mg/dl  POCT glycosylated hemoglobin (Hb A1C)  Result Value Ref Range   Hemoglobin A1C 4.6    She is unable to provide a urine specimen, and refuses to drink water here. She offered to take the water home, and bring a urine specimen back tomorrow.  Given ondansetron ODT 8 mg here with some relief.     Assessment & Plan:   1. Nausea and vomiting, intractability of vomiting not specified, unspecified vomiting type Significant leucocystosis, with multiple possible causes. Concern that poor hydration is masking possible lung sounds that could identify a  pulmonary process. May have a recurrent C diff infection. May also have pancreatitis. Refused transport to ED by EMS. Husband agrees to drive her directly. - POCT CBC - POCT glucose (manual entry) - POCT urinalysis dipstick - POCT Microscopic Urinalysis ([facility]) - Comprehensive metabolic panel - Lipase - Amylase - ondansetron (ZOFRAN-ODT) disintegrating tablet 8 mg; Take 2 tablets (8 mg total) by mouth once. - POCT glycosylated hemoglobin (Hb A1C)   Fara Chute, PA-C Physician Assistant-Certified [facility]

## 2015-11-16 NOTE — ED Provider Notes (Signed)
CSN: [id number]     Arrival date & time 11/16/15  1424 History   First MD Initiated Contact with Patient 11/16/15 1815     Chief Complaint  Patient presents with  . Abnormal Lab  . Abdominal Pain     (Consider location/radiation/quality/duration/timing/severity/associated sxs/prior Treatment) HPI   Holly Phelps is a 54 y.o. female with PMH significant for anemia, HTN, hyperthyroidism, GERD who has been seen multiple times in the past couple of months of similar presentations of N/V and generalized abdominal pain and CT abdomen pelvis revealed colitis likely secondary to c.diff for which she received flagyl and vancomycin with most recent c.diff pcr (09/29/15) negative who presents with 3 day history of N/V (now resolved) and gradually worsening, constant abdominal pain.  She was sent from UC due to leukocytosis.   No modifying or aggravating factors.  She reports decreased appetite and increased urinary frequency.  Denies dysuria, diarrhea, cough, SOB, CP, or fever.  No recent travel.  No sick contacts.  No dietary changes.  She smokes 4-5 cigarettes/day.  She drinks 1-2 drinks/day with the last drink being on Sunday.   Past Medical History  Diagnosis Date  . Allergy   . Anemia   . 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 Hypertension   . Hypothyroidism   . GERD (gastroesophageal reflux disease)   . Headache    Past Surgical History  Procedure Laterality Date  . Laparoscopic appendectomy    . Back surgery      x 2  . Colonoscopy N/A 03/13/2013    Procedure: COLONOSCOPY;  Surgeon: Leighton Ruff, MD;  Location: [facility];  Service: Endoscopy;  Laterality: N/A;   Family History  Problem Relation Age of Onset  . Stroke Father   . Stroke Brother   . Stroke Brother    Social History  Substance Use Topics  . Smoking status: Current Every Day Smoker -- 0.20 packs/day    Types: Cigarettes  . Smokeless tobacco: Never Used     Comment: 3 cigarettes per day  . Alcohol Use: 6.0 oz/week    10 Glasses of wine per week     Comment: working to reduce this due to illness   OB History    No data available     Review of Systems  All other systems negative unless otherwise stated in HPI   Allergies  Codeine; Flexeril; Sulfa antibiotics; and Synthroid  Home Medications   Prior to Admission medications   Medication Sig Start Date End Date Taking? Authorizing Provider  Cranberry 500 MG CHEW Chew 1 tablet by mouth daily.    Yes Historical Provider, MD  ferrous gluconate (FERGON) 240 (27 FE) MG tablet Take 240 mg by mouth 2 (two) times daily.    Yes Historical Provider, MD  ibuprofen (ADVIL,MOTRIN) 200 MG tablet Take 200-400 mg by mouth every 6 (six) hours as needed for moderate pain.   Yes Historical Provider, MD  Multiple Vitamin (MULTIVITAMIN WITH MINERALS) TABS Take 1 tablet by mouth daily. 01/29/13  Yes [name], MD  ondansetron (ZOFRAN ODT) 4 MG disintegrating tablet Take 1 tablet (4 mg total) by mouth every 8 (eight) hours as needed for nausea or vomiting. 09/27/15  Yes Burgher Scrape V, PA-C  thiamine 100 MG tablet Take 1 tablet (100 mg total) by mouth daily. 04/27/15  Yes Jessica U Vann, DO  ondansetron (ZOFRAN) 4 MG tablet Take 1 tablet (4 mg total) by mouth every 6 (six) hours. Patient not taking: Reported  on 11/16/2015 10/01/15   Hanna Patel-Mills, PA-C   BP 133/85 mmHg  Pulse 98  Temp(Src) 98.3 F (36.8 C) (Oral)  Resp 18  SpO2 100% Physical Exam  Constitutional: She is oriented to person, place, and time. She appears well-developed and well-nourished. She appears ill.  HENT:  Head: Normocephalic and atraumatic.  Mouth/Throat: Oropharynx is clear and moist.  Eyes: Conjunctivae are normal. Pupils are equal, round, and reactive to light.  Neck: Normal range of motion. Neck supple.  Cardiovascular: Normal rate, regular rhythm and normal heart sounds.   No murmur heard. Pulmonary/Chest: Effort normal and breath sounds normal. No accessory muscle usage or  stridor. No respiratory distress. She has no wheezes. She has no rhonchi. She has no rales.  Abdominal: Soft. Bowel sounds are normal. She exhibits no distension. There is tenderness (mild) in the periumbilical area. There is no rebound and no guarding.  Musculoskeletal: Normal range of motion.  Lymphadenopathy:    She has no cervical adenopathy.  Neurological: She is alert and oriented to person, place, and time.  Speech clear without dysarthria.  Skin: Skin is warm and dry.  Psychiatric: She has a normal mood and affect. Her behavior is normal.    ED Course  Procedures (including critical care time) Labs Review Labs Reviewed  URINALYSIS, ROUTINE W REFLEX MICROSCOPIC (NOT AT [facility]) - Abnormal; Notable for the following:    Bilirubin Urine MODERATE (*)    Ketones, ur >80 (*)    All other components within normal limits  CBC WITH DIFFERENTIAL/PLATELET - Abnormal; Notable for the following:    WBC 22.8 (*)    RBC 2.92 (*)    Hemoglobin 10.2 (*)    HCT 30.4 (*)    MCV 104.1 (*)    MCH 34.9 (*)    Platelets 451 (*)    Neutro Abs 19.1 (*)    Monocytes Absolute 2.1 (*)    All other components within normal limits  COMPREHENSIVE METABOLIC PANEL - Abnormal; Notable for the following:    Sodium 129 (*)    Potassium 3.3 (*)    Chloride 94 (*)    CO2 14 (*)    Glucose, Bld 100 (*)    BUN <5 (*)    Calcium 8.4 (*)    AST 120 (*)    Alkaline Phosphatase 290 (*)    Anion gap 21 (*)    All other components within normal limits  BASIC METABOLIC PANEL - Abnormal; Notable for the following:    Potassium 2.4 (*)    CO2 16 (*)    BUN <5 (*)    Calcium 7.5 (*)    All other components within normal limits  LIPASE, BLOOD  PREGNANCY, URINE  I-STAT CG4 LACTIC ACID, ED    Imaging Review Dg Abd Acute W/chest  11/16/2015  CLINICAL DATA:  54 year old female with abdominal pain and leukocytosis EXAM: DG ABDOMEN ACUTE W/ 1V CHEST COMPARISON:  CT dated 10/01/2015 FINDINGS: The lungs are clear.  No pleural effusion or pneumothorax. The osseous structures appear unremarkable. Cervical spine fixation plate and screws. There is no evidence of bowel obstruction. No radiopaque calculus or foreign object. There is degenerative changes of the lower lumbar spine. No acute fracture. IMPRESSION: Negative abdominal radiographs.  No acute cardiopulmonary disease. Electronically Signed   By: Anner Crete M.D.   On: 11/16/2015 20:36   I have personally reviewed and evaluated these images and lab results as part of my medical decision-making.   EKG Interpretation  Date/Time:  Wednesday November 16 2015 18:33:53 EST Ventricular Rate:  96 PR Interval:  140 QRS Duration: 87 QT Interval:  357 QTC Calculation: 451 R Axis:     Text Interpretation:  Sinus rhythm Abnormal R-wave progression, early  transition No significant change since last tracing Confirmed by YAO  MD,  DAVID ([id number]) on 11/16/2015 8:01:55 PM      MDM   Final diagnoses:  Hypokalemia  Nausea  Generalized abdominal pain    Patient presents with 3 day history of abdominal pain and N/V who was sent from UC for leukocytosis.  Patient has had previous ED visits in the past for similar presentations and dxed with colitis and treated with flagyl and vanc.  She reports this is similar.  No diarrhea or dysuria.  Blood pressure 136/75, pulse 111, temperature 98.3 F (36.8 C), temperature source Oral, resp. rate 14, SpO2 98 %. On exam, heart RRR, lungs CTAB, abdomen with mild periumbilical tenderness.  Lactic acid 0.63. CBC shows leukocytosis 22.8 and hgb 10.2 CMP shows bicarb 14 and anion gap 21, AST 120 likely secondary to hepatitis B.  Will give 2L fluids and recheck BMP. UA shows ketones likely secondary to dehydration. AXR negative.  Upon reassessment, reassuring repeat abdominal exam with mild tenderness, no guarding or rebound.  Patient's symptoms have improved and able to tolerate PO intake.  Suspect persistent colitis.   Doubt abscess or perforation.  She is not having any diarrhea and most recent stool negative for c.diff.  No indication for abx.  1:30 AM: Repeat BMP shows Na 135 and K 2.4.  This is after 2 redraws to ensure accuracy.  Will admit for hypokalemia.    Gloriann Loan, PA-C 11/17/15 0136  Wandra Arthurs, MD 11/17/15 [phone]

## 2015-11-16 NOTE — ED Notes (Signed)
Patient was ambulatory to restroom without assistance, steady gait.

## 2015-11-16 NOTE — ED Notes (Addendum)
Pt sent from urgent care due to increased WBC. Pt complains of nausea/vomiting on Sunday, lack of appetitive since Sunday. Pt states she has 5/10 pain in her lower abdomen. Pt denies diarrhea. Pt states she was recently diagnosed with hepatitis B. Pt denies dysuria, urinary frequency, odor.

## 2015-11-16 NOTE — ED Notes (Signed)
Pt being sent by [facility].  C/o n/v x 3 days.  Denies abdominal pain and diarrhea.  Given 8mg  Zofran in office.  Hx of chronic ETOH and cocaine abuse, pancreatitis, and Hepatitis B.  WBC 20,000.  Pt refused to provide a UA.

## 2015-11-16 NOTE — Addendum Note (Signed)
Addended by: Burnis Kingfisher on: 11/16/2015 02:56 PM   Modules accepted: Orders

## 2015-11-16 NOTE — ED Notes (Signed)
Awake. Verbally responsive. Resp even and unlabored. No audible adventitious breath sounds noted. ABC's intact. Abd soft/nondistended but tender to palpate. BS (+) and active x4 quadrants. Pt reported abd pain and poor po intake and n/v/d on Sunday. Family at bedside.

## 2015-11-17 ENCOUNTER — Inpatient Hospital Stay (HOSPITAL_COMMUNITY): Payer: BLUE CROSS/BLUE SHIELD

## 2015-11-17 ENCOUNTER — Encounter (HOSPITAL_COMMUNITY): Payer: Self-pay | Admitting: Internal Medicine

## 2015-11-17 DIAGNOSIS — R112 Nausea with vomiting, unspecified: Secondary | ICD-10-CM | POA: Insufficient documentation

## 2015-11-17 DIAGNOSIS — R1110 Vomiting, unspecified: Secondary | ICD-10-CM

## 2015-11-17 DIAGNOSIS — E43 Unspecified severe protein-calorie malnutrition: Secondary | ICD-10-CM | POA: Diagnosis present

## 2015-11-17 DIAGNOSIS — E876 Hypokalemia: Secondary | ICD-10-CM | POA: Diagnosis not present

## 2015-11-17 DIAGNOSIS — D72829 Elevated white blood cell count, unspecified: Secondary | ICD-10-CM | POA: Diagnosis present

## 2015-11-17 LAB — CBC WITH DIFFERENTIAL/PLATELET
Basophils Absolute: 0 10*3/uL (ref 0.0–0.1)
Basophils Relative: 0 %
Eosinophils Absolute: 0.2 10*3/uL (ref 0.0–0.7)
Eosinophils Relative: 1 %
HCT: 25.4 % — ABNORMAL LOW (ref 36.0–46.0)
Hemoglobin: 8.3 g/dL — ABNORMAL LOW (ref 12.0–15.0)
Lymphocytes Relative: 3 %
Lymphs Abs: 0.6 10*3/uL — ABNORMAL LOW (ref 0.7–4.0)
MCH: 34.4 pg — ABNORMAL HIGH (ref 26.0–34.0)
MCHC: 32.7 g/dL (ref 30.0–36.0)
MCV: 105.4 fL — ABNORMAL HIGH (ref 78.0–100.0)
Monocytes Absolute: 1.7 10*3/uL — ABNORMAL HIGH (ref 0.1–1.0)
Monocytes Relative: 8 %
Neutro Abs: 18.6 10*3/uL — ABNORMAL HIGH (ref 1.7–7.7)
Neutrophils Relative %: 88 %
Platelets: 343 10*3/uL (ref 150–400)
RBC: 2.41 MIL/uL — ABNORMAL LOW (ref 3.87–5.11)
RDW: 15.6 % — ABNORMAL HIGH (ref 11.5–15.5)
WBC Morphology: INCREASED
WBC: 21.1 10*3/uL — ABNORMAL HIGH (ref 4.0–10.5)

## 2015-11-17 LAB — C DIFFICILE QUICK SCREEN W PCR REFLEX
C Diff antigen: POSITIVE — AB
C Diff interpretation: POSITIVE
C Diff toxin: POSITIVE — AB

## 2015-11-17 LAB — BASIC METABOLIC PANEL
Anion gap: 15 (ref 5–15)
BUN: <5 mg/dL — ABNORMAL LOW (ref 6–20)
CO2: 16 mmol/L — ABNORMAL LOW (ref 22–32)
Calcium: 7.5 mg/dL — ABNORMAL LOW (ref 8.9–10.3)
Chloride: 104 mmol/L (ref 101–111)
Creatinine, Ser: 0.52 mg/dL (ref 0.44–1.00)
GFR calc Af Amer: >60 mL/min (ref >60)
GFR calc non Af Amer: >60 mL/min (ref >60)
Glucose, Bld: 99 mg/dL (ref 65–99)
Potassium: 2.4 mmol/L — CL (ref 3.5–5.1)
Sodium: 135 mmol/L (ref 135–145)

## 2015-11-17 LAB — COMPREHENSIVE METABOLIC PANEL
ALT: 29 U/L (ref 14–54)
AST: 116 U/L — ABNORMAL HIGH (ref 15–41)
Albumin: 2.7 g/dL — ABNORMAL LOW (ref 3.5–5.0)
Alkaline Phosphatase: 261 U/L — ABNORMAL HIGH (ref 38–126)
Anion gap: 14 (ref 5–15)
BUN: <5 mg/dL — ABNORMAL LOW (ref 6–20)
CO2: 16 mmol/L — ABNORMAL LOW (ref 22–32)
Calcium: 6.7 mg/dL — ABNORMAL LOW (ref 8.9–10.3)
Chloride: 106 mmol/L (ref 101–111)
Creatinine, Ser: 0.47 mg/dL (ref 0.44–1.00)
GFR calc Af Amer: >60 mL/min (ref >60)
GFR calc non Af Amer: >60 mL/min (ref >60)
Glucose, Bld: 81 mg/dL (ref 65–99)
Potassium: 3.2 mmol/L — ABNORMAL LOW (ref 3.5–5.1)
Sodium: 136 mmol/L (ref 135–145)
Total Bilirubin: 0.8 mg/dL (ref 0.3–1.2)
Total Protein: 5.8 g/dL — ABNORMAL LOW (ref 6.5–8.1)

## 2015-11-17 LAB — RAPID URINE DRUG SCREEN, HOSP PERFORMED
Amphetamines: NOT DETECTED
Barbiturates: NOT DETECTED
Benzodiazepines: NOT DETECTED
Cocaine: NOT DETECTED
Opiates: NOT DETECTED
Tetrahydrocannabinol: NOT DETECTED

## 2015-11-17 LAB — MAGNESIUM: Magnesium: 0.9 mg/dL — CL (ref 1.7–2.4)

## 2015-11-17 MED ORDER — VITAMIN B1 100 MG PO TABS
100.0000 mg | ORAL_TABLET | Freq: Every day | ORAL | Status: DC
  Filled 2015-11-17: qty 1

## 2015-11-17 MED ORDER — POTASSIUM CHLORIDE 10 MEQ/100ML IV SOLN
10.0000 meq | INTRAVENOUS | Status: AC
  Administered 2015-11-17 (×5): 10 meq via INTRAVENOUS
  Filled 2015-11-17 (×3): qty 100

## 2015-11-17 MED ORDER — ENOXAPARIN SODIUM 30 MG/0.3ML ~~LOC~~ SOLN
30.0000 mg | Freq: Every day | SUBCUTANEOUS | Status: DC
  Administered 2015-11-17 – 2015-11-19 (×3): 30 mg via SUBCUTANEOUS
  Filled 2015-11-17 (×3): qty 0.3

## 2015-11-17 MED ORDER — VANCOMYCIN 50 MG/ML ORAL SOLUTION
125.0000 mg | Freq: Four times a day (QID) | ORAL | Status: DC
  Administered 2015-11-17 – 2015-11-18 (×6): 125 mg via ORAL
  Filled 2015-11-17 (×9): qty 2.5

## 2015-11-17 MED ORDER — BOOST / RESOURCE BREEZE PO LIQD
1.0000 | Freq: Two times a day (BID) | ORAL | Status: DC
  Administered 2015-11-17 – 2015-11-18 (×3): 1 via ORAL

## 2015-11-17 MED ORDER — SODIUM CHLORIDE 0.9 % IV SOLN
INTRAVENOUS | Status: DC
  Administered 2015-11-17 (×3): via INTRAVENOUS

## 2015-11-17 MED ORDER — ENOXAPARIN SODIUM 40 MG/0.4ML ~~LOC~~ SOLN: 40.0000 mg | SUBCUTANEOUS | Status: DC

## 2015-11-17 MED ORDER — FOLIC ACID 1 MG PO TABS
1.0000 mg | ORAL_TABLET | Freq: Every day | ORAL | Status: DC
Start: 2015-11-17 — End: 2015-11-19
  Administered 2015-11-17 – 2015-11-19 (×3): 1 mg via ORAL
  Filled 2015-11-17 (×3): qty 1

## 2015-11-17 MED ORDER — FERROUS GLUCONATE 324 (38 FE) MG PO TABS
324.0000 mg | ORAL_TABLET | Freq: Two times a day (BID) | ORAL | Status: DC
  Administered 2015-11-17 – 2015-11-19 (×5): 324 mg via ORAL
  Filled 2015-11-17 (×5): qty 1

## 2015-11-17 MED ORDER — ADULT MULTIVITAMIN W/MINERALS CH
1.0000 | ORAL_TABLET | Freq: Every day | ORAL | Status: DC
  Administered 2015-11-17 – 2015-11-19 (×3): 1 via ORAL
  Filled 2015-11-17 (×3): qty 1

## 2015-11-17 MED ORDER — ONDANSETRON 4 MG PO TBDP: 4.0000 mg | ORAL_TABLET | Freq: Three times a day (TID) | ORAL | Status: DC | PRN

## 2015-11-17 MED ORDER — ONDANSETRON HCL 4 MG PO TABS: 4.0000 mg | ORAL_TABLET | Freq: Four times a day (QID) | ORAL | Status: DC | PRN

## 2015-11-17 MED ORDER — LORAZEPAM 2 MG/ML IJ SOLN: 1.0000 mg | Freq: Four times a day (QID) | INTRAMUSCULAR | Status: DC | PRN

## 2015-11-17 MED ORDER — VITAMIN B-1 100 MG PO TABS
100.0000 mg | ORAL_TABLET | Freq: Every day | ORAL | Status: DC
  Administered 2015-11-17 – 2015-11-19 (×3): 100 mg via ORAL
  Filled 2015-11-17 (×3): qty 1

## 2015-11-17 MED ORDER — ADULT MULTIVITAMIN W/MINERALS CH
1.0000 | ORAL_TABLET | Freq: Every day | ORAL | Status: DC
  Filled 2015-11-17: qty 1

## 2015-11-17 MED ORDER — THIAMINE HCL 100 MG/ML IJ SOLN
100.0000 mg | Freq: Every day | INTRAMUSCULAR | Status: DC
  Filled 2015-11-17 (×3): qty 1

## 2015-11-17 MED ORDER — MAGNESIUM SULFATE 2 GM/50ML IV SOLN
2.0000 g | Freq: Once | INTRAVENOUS | Status: AC
  Administered 2015-11-17: 2 g via INTRAVENOUS
  Filled 2015-11-17: qty 50

## 2015-11-17 MED ORDER — LORAZEPAM 1 MG PO TABS: 1.0000 mg | ORAL_TABLET | Freq: Four times a day (QID) | ORAL | Status: DC | PRN

## 2015-11-17 MED ORDER — POTASSIUM CHLORIDE 10 MEQ/100ML IV SOLN: 10.0000 meq | Freq: Once | INTRAVENOUS | Status: DC

## 2015-11-17 MED ORDER — ONDANSETRON HCL 4 MG/2ML IJ SOLN: 4.0000 mg | Freq: Four times a day (QID) | INTRAMUSCULAR | Status: DC | PRN

## 2015-11-17 NOTE — Progress Notes (Signed)
Initial Nutrition Assessment   DOCUMENTATION CODES:   Severe malnutrition in context of acute illness/injury, Underweight  INTERVENTION:  - Will order Boost Breeze BID, each supplement provides 250 kcal and 9 grams of protein - Diet advancement as medically feasible - RD will continue to monitor for needs  NUTRITION DIAGNOSIS:   Inadequate oral intake related to acute illness, nausea, vomiting as evidenced by per patient/family report.  GOAL:   Patient will meet greater than or equal to 90% of their needs  MONITOR:   PO intake, Diet advancement, Weight trends, Labs, I & O's  REASON FOR ASSESSMENT:   Malnutrition Screening Tool  ASSESSMENT:   54 y.o. female with history of polysubstance abuse, chronic anemia presents to the ER because of nausea vomiting. Patient has been expressing nausea vomiting for last 24 was. Multiple episodes denies any blood in the vomitus. Denies any diarrhea. Has been having some epigastric pain after throwing up. Patient had gone to her PCP who referred her to the ER. In the ER patient's blood work show severe hypokalemia with mild anion gap acidosis. Patient denies drinking alcohol recently. Patient has been admitted for IV hydration and potassium replacement. Patient has not had any further episodes of vomiting after admission. Labs also revealed significant leukocytosis but patient is afebrile and does not have any signs of infection. Patient does have history of previous episodes of recurrent C. difficile colitis but at this time denies any diarrhea.  Pt seen for MST. BMI indicates underweight status. Pt on FLD with no intakes documented since admission. Pt reports she is going to order milk and lemonade this AM. Pt very drowsy during RD visit and states she did not sleep well last night. Pt requesting discussion occur once she is better rested. RD will be out of the hospital after this AM but will be able to follow-up tomorrow (12/30).  Unable to  complete physical assessment. Per chart review, pt has lost 6 lbs (6% body weight) in the past 1 month which is significant for time frame.   Not meeting needs at this time. Will order clear liquid supplement as it may be better tolerated at this time. Medications reviewed. Labs reviewed; K: 2.4 mmol/L, BUN <5, Ca: 7.5 mg/dL.   Diet Order:  Diet full liquid Room service appropriate?: Yes; Fluid consistency:: Thin  Skin:  Reviewed, no issues  Last BM:  PTA  Height:   Ht Readings from Last 1 Encounters:  11/17/15 5\' 2"  (1.575 m)    Weight:   Wt Readings from Last 1 Encounters:  11/17/15 93 lb 6.4 oz (42.366 kg)    Ideal Body Weight:  50 kg (kg)  BMI:  Body mass index is 17.08 kg/(m^2).  Estimated Nutritional Needs:   Kcal:  1270-1480 (30-35 kcal/kg)  Protein:  42-55 grams  Fluid:  2 L/day  EDUCATION NEEDS:   No education needs identified at this time     Jarome Matin, RD, LDN Inpatient Clinical Dietitian Pager # [phone] After hours/weekend pager # [phone]

## 2015-11-17 NOTE — H&P (Signed)
[facility] History and Physical  Holly Phelps [id number] DOB: 01-03-1961 DOA: 11/16/2015  Referring physician: Ms.Rose. PCP: Kennon Portela, MD  Specialists: None.  Chief Complaint: Nausea vomiting.  HPI: Holly Phelps is a 54 y.o. female with history of polysubstance abuse, chronic anemia presents to the ER because of nausea vomiting. Patient has been expressing nausea vomiting for last 24 was. Multiple episodes denies any blood in the vomitus. Denies any diarrhea. Has been having some epigastric pain after throwing up. Patient had gone to her PCP who referred her to the ER. In the ER patient's blood work show severe hypokalemia with mild anion gap acidosis. Patient denies drinking alcohol recently. Patient has been admitted for IV hydration and potassium replacement. Patient has not had any further episodes of vomiting after admission. Labs also revealed significant leukocytosis but patient is afebrile and does not have any signs of infection. Patient does have history of previous episodes of recurrent C. difficile colitis but at this time denies any diarrhea.  Review of Systems: As presented in the history of presenting illness, rest negative.  Past Medical History  Diagnosis Date  . Allergy   . Anemia   . 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 Hypertension   . Hypothyroidism   . GERD (gastroesophageal reflux disease)   . Headache    Past Surgical History  Procedure Laterality Date  . Laparoscopic appendectomy    . Back surgery      x 2  . Colonoscopy N/A 03/13/2013    Procedure: COLONOSCOPY;  Surgeon: Leighton Ruff, MD;  Location: [facility];  Service: Endoscopy;  Laterality: N/A;   Social History:  reports that she has been smoking Cigarettes.  She has been smoking about 0.20 packs per day. She has never used smokeless tobacco. She reports that she drinks about 6.0 oz of alcohol per week. She reports that she uses illicit drugs (Cocaine). Where does patient live  home. Can patient participate in ADLs? Yes.  Allergies  Allergen Reactions  . Codeine Nausea And Vomiting  . Flexeril [Cyclobenzaprine] Itching  . Sulfa Antibiotics     Pt does not recall what the reaction was,been too long  . Synthroid [Levothyroxine] Rash    Family History:  Family History  Problem Relation Age of Onset  . Stroke Father   . Stroke Brother   . Stroke Brother       Prior to Admission medications   Medication Sig Start Date End Date Taking? Authorizing Provider  Cranberry 500 MG CHEW Chew 1 tablet by mouth daily.    Yes Historical Provider, MD  ferrous gluconate (FERGON) 240 (27 FE) MG tablet Take 240 mg by mouth 2 (two) times daily.    Yes Historical Provider, MD  ibuprofen (ADVIL,MOTRIN) 200 MG tablet Take 200-400 mg by mouth every 6 (six) hours as needed for moderate pain.   Yes Historical Provider, MD  Multiple Vitamin (MULTIVITAMIN WITH MINERALS) TABS Take 1 tablet by mouth daily. 01/29/13  Yes [name], MD  ondansetron (ZOFRAN ODT) 4 MG disintegrating tablet Take 1 tablet (4 mg total) by mouth every 8 (eight) hours as needed for nausea or vomiting. 09/27/15  Yes Ramanathan Scrape V, PA-C  thiamine 100 MG tablet Take 1 tablet (100 mg total) by mouth daily. 04/27/15  Yes Jessica U Vann, DO  ondansetron (ZOFRAN) 4 MG tablet Take 1 tablet (4 mg total) by mouth every 6 (six) hours. Patient not taking: Reported on 11/16/2015 10/01/15   Orvil Feil  Patel-Mills, PA-C    Physical Exam: Filed Vitals:   11/16/15 2200 11/16/15 2220 11/16/15 2300 11/17/15 0240  BP: 134/79 134/79 133/85 112/85  Pulse: 101 96 98 96  Temp:    98.4 F (36.9 C)  TempSrc:    Oral  Resp: 21 18 18 18   SpO2: 100% 100% 100% 100%     General:  Moderately built and poorly nourished.  Eyes: Anicteric. No pallor.  ENT: No discharge from the ears eyes nose and mouth.  Neck: No mass felt.  Cardiovascular: S1 and S2 heard.  Respiratory: No rhonchi or crepitations.  Abdomen: Soft nontender  bowel sounds present.  Skin: No rash.  Musculoskeletal: No edema.  Psychiatric: Appears normal.  Neurologic: Alert awake oriented to time place and person. Moves all extremities.  Labs on Admission:  Basic Metabolic Panel:  Recent Labs Lab 11/16/15 1322 11/16/15 1834 11/17/15 0039  NA 132* 129* 135  K 3.6 3.3* 2.4*  CL 91* 94* 104  CO2 16* 14* 16*  GLUCOSE 87 100* 99  BUN 2* <5* <5*  CREATININE 0.52 0.66 0.52  CALCIUM 8.2* 8.4* 7.5*   Liver Function Tests:  Recent Labs Lab 11/16/15 1322 11/16/15 1834  AST 111* 120*  ALT 30* 33  ALKPHOS 278* 290*  BILITOT 0.5 1.2  PROT 6.6 7.7  ALBUMIN 3.4* 3.5    Recent Labs Lab 11/16/15 1322 11/16/15 1834  LIPASE <5* 20  AMYLASE 18  --    No results for input(s): AMMONIA in the last 168 hours. CBC:  Recent Labs Lab 11/16/15 1335 11/16/15 1834  WBC 22.0* 22.8*  NEUTROABS  --  19.1*  HGB 9.6* 10.2*  HCT 28.3* 30.4*  MCV 101.5* 104.1*  PLT  --  451*   Cardiac Enzymes: No results for input(s): CKTOTAL, CKMB, CKMBINDEX, TROPONINI in the last 168 hours.  BNP (last 3 results) No results for input(s): BNP in the last 8760 hours.  ProBNP (last 3 results) No results for input(s): PROBNP in the last 8760 hours.  CBG: No results for input(s): GLUCAP in the last 168 hours.  Radiological Exams on Admission: Dg Abd Acute W/chest  11/16/2015  CLINICAL DATA:  54 year old female with abdominal pain and leukocytosis EXAM: DG ABDOMEN ACUTE W/ 1V CHEST COMPARISON:  CT dated 10/01/2015 FINDINGS: The lungs are clear. No pleural effusion or pneumothorax. The osseous structures appear unremarkable. Cervical spine fixation plate and screws. There is no evidence of bowel obstruction. No radiopaque calculus or foreign object. There is degenerative changes of the lower lumbar spine. No acute fracture. IMPRESSION: Negative abdominal radiographs.  No acute cardiopulmonary disease. Electronically Signed   By: Anner Crete M.D.   On:  11/16/2015 20:36     Assessment/Plan Principal Problem:   Nausea & vomiting Active Problems:   Hypokalemia   Leucocytosis   1. Nausea vomiting - cause not clear. Check sonogram of the abdomen for any gallbladder pathology. Patient's vomiting has improved since arrival to ER. I have placed patient on clear liquid diet. 2. Hypokalemia - probably from vomiting. Replace and recheck. Check magnesium levels. 3. Metabolic acidosis probably from vomiting - hydrate and recheck metabolic panel. 4. Leukocytosis - does not have any fever or any source of infection at this time. Has had previous episodes of C. difficile colitis but denies any diarrhea at this time. 5. Chronic anemia - continue iron supplements. 6. Polysubstance abuse - . Drug screen pending. Patient states has not had any alcohol recently. Patient is on CIWA protocol.  DVT Prophylaxis Lovenox.  Code Status: Full code.  Family Communication: Discussed with patient.  Disposition Plan: Admit to inpatient.    Ridhi Hoffert N. [facility] Pager [phone].  If 7PM-7AM, please contact night-coverage [email or web address] Password [facility] 11/17/2015, 4:17 AM

## 2015-11-17 NOTE — Progress Notes (Signed)
PROGRESS NOTE  Holly Phelps [id number] DOB: [birth year] DOA: 11/16/2015 PCP: Kennon Portela, MD  HPI/Recap of past 72 hours: 54 year old female with past history of polysubstance abuse and multiple episodes of C. difficile in the past admitted in the early morning of 12/29 for 1 day recurrent nausea and vomiting. In the emergency room, patient noted to be significantly hypokalemic as well as with a leukocytosis of 22. Initially, no reports of diarrhea.  Following arrival to floor, patient had one episode of significant diarrhea. She stated that it happened so fast, she was not able to make it to the bathroom in time. White blood cell count has remained persistently elevated with no fever. Patient self states that her nausea is feeling a little bit better. She is tolerating some clear liquids. No pain or other complaints.   Assessment/Plan: Active Problems:    Nausea vomiting and diarrhea: Highly suspicious for C. difficile. Checking PCR and placing on enteric precautions.   Hypokalemia: Secondary to GI loss. Replacing    Tobacco use disorder: Patient continues to smoke. Nicotine patch.    Hypomagnesemia: Secondary to GI loss. Replacing    Leucocytosis: Certainly concerning for C. difficile given history. We'll place on enteric precautions and check stool. Possibly this could be stress margination from recurrent nausea and vomiting, gastroenteritis, but less likely. Continue to monitor. Recheck labs in the morning    Protein-calorie malnutrition, severe: Seen by nutrition. Patient meets criteria for severe malnutrition in the context of acute illness and injury plus she is underweight. Started on boost breeze twice a day   Code Status: Full code   Family Communication: Left message with husband   Disposition Plan: Depending on PCR results, may be able to be discharge his earliest [place] flaps improve or if PCR positive, may be here for several days     Consultants:  None   Procedures:  None   Antibiotics:  None    Objective: BP 112/74 mmHg  Pulse 95  Temp(Src) 98.1 F (36.7 C) (Oral)  Resp 18  Ht 5\' 2"  (1.575 m)  Wt 42.366 kg (93 lb 6.4 oz)  BMI 17.08 kg/m2  SpO2 100%  Intake/Output Summary (Last 24 hours) at 11/17/15 1147 Last data filed at 11/17/15 0810  Gross per 24 hour  Intake 493.33 ml  Output    250 ml  Net 243.33 ml   Filed Weights   11/17/15 0537  Weight: 42.366 kg (93 lb 6.4 oz)    Exam:   General:  Alert and oriented 3, no acute distress   Cardiovascular: Regular rate and rhythm, S1-S2, borderline tachycardia   Respiratory: Clear to auscultation bilaterally   Abdomen: Soft, non-distended, generalized nonspecific tenderness with deep palpation, hyperactive bowel sounds   Musculoskeletal: No clubbing or cyanosis or edema    Data Reviewed: Basic Metabolic Panel:  Recent Labs Lab 11/16/15 1322 11/16/15 1834 11/17/15 0039 11/17/15 0629  NA 132* 129* 135 136  K 3.6 3.3* 2.4* 3.2*  CL 91* 94* 104 106  CO2 16* 14* 16* 16*  GLUCOSE 87 100* 99 81  BUN 2* <5* <5* <5*  CREATININE 0.52 0.66 0.52 0.47  CALCIUM 8.2* 8.4* 7.5* 6.7*  MG  --   --   --  0.9*   Liver Function Tests:  Recent Labs Lab 11/16/15 1322 11/16/15 1834 11/17/15 0629  AST 111* 120* 116*  ALT 30* 33 29  ALKPHOS 278* 290* 261*  BILITOT 0.5 1.2 0.8  PROT 6.6 7.7 5.8*  ALBUMIN  3.4* 3.5 2.7*    Recent Labs Lab 11/16/15 1322 11/16/15 1834  LIPASE <5* 20  AMYLASE 18  --    No results for input(s): AMMONIA in the last 168 hours. CBC:  Recent Labs Lab 11/16/15 1335 11/16/15 1834 11/17/15 0629  WBC 22.0* 22.8* 21.1*  NEUTROABS  --  19.1* 18.6*  HGB 9.6* 10.2* 8.3*  HCT 28.3* 30.4* 25.4*  MCV 101.5* 104.1* 105.4*  PLT  --  451* 343   Cardiac Enzymes:   No results for input(s): CKTOTAL, CKMB, CKMBINDEX, TROPONINI in the last 168 hours. BNP (last 3 results) No results for input(s): BNP in the last  8760 hours.  ProBNP (last 3 results) No results for input(s): PROBNP in the last 8760 hours.  CBG: No results for input(s): GLUCAP in the last 168 hours.  No results found for this or any previous visit (from the past 240 hour(s)).   Studies: US Abdomen Complete  11/17/2015  CLINICAL DATA:  Acute onset of generalized abdominal pain, nausea and vomiting. Initial encounter. EXAM: ABDOMEN ULTRASOUND COMPLETE COMPARISON:  CT of the abdomen and pelvis performed 10/01/2015, and abdominal ultrasound performed 04/24/2015 FINDINGS: Gallbladder: No gallstones or wall thickening visualized. No sonographic Murphy sign noted by sonographer. Common bile duct: Diameter: 0.3 cm, within normal limits in caliber. Liver: No focal lesion identified. Diffusely increased parenchymal echogenicity and coarsened echotexture likely reflects fatty infiltration. IVC: No abnormality visualized. Pancreas: Visualized portion unremarkable. Spleen: Size and appearance within normal limits. Right Kidney: Length: 10.4 cm. Echogenicity within normal limits. No mass or hydronephrosis visualized. Left Kidney: Length: 10.6 cm. Echogenicity within normal limits. No mass or hydronephrosis visualized. Abdominal aorta: No aneurysm visualized. Scattered calcification is noted along the abdominal aorta. Other findings: None. IMPRESSION: 1. No acute abnormality seen within the abdomen. 2. Diffuse fatty infiltration within the liver. 3. Scattered calcification along the abdominal aorta. Electronically Signed   By: Garald Balding M.D.   On: 11/17/2015 04:33   Dg Abd Acute W/chest  11/16/2015  CLINICAL DATA:  54 year old female with abdominal pain and leukocytosis EXAM: DG ABDOMEN ACUTE W/ 1V CHEST COMPARISON:  CT dated 10/01/2015 FINDINGS: The lungs are clear. No pleural effusion or pneumothorax. The osseous structures appear unremarkable. Cervical spine fixation plate and screws. There is no evidence of bowel obstruction. No radiopaque calculus  or foreign object. There is degenerative changes of the lower lumbar spine. No acute fracture. IMPRESSION: Negative abdominal radiographs.  No acute cardiopulmonary disease. Electronically Signed   By: Anner Crete M.D.   On: 11/16/2015 20:36    Scheduled Meds: . enoxaparin (LOVENOX) injection  30 mg Subcutaneous Daily  . feeding supplement  1 Container Oral BID BM  . ferrous gluconate  324 mg Oral BID  . folic acid  1 mg Oral Daily  . magnesium sulfate 1 - 4 g bolus IVPB  2 g Intravenous Once  . multivitamin with minerals  1 tablet Oral Daily  . thiamine  100 mg Oral Daily   Or  . thiamine  100 mg Intravenous Daily    Continuous Infusions: . sodium chloride 125 mL/hr at 11/17/15 0940     Time spent: 25 minutes   [facility] Pager [phone] . If 7PM-7AM, please contact night-coverage at [email or web address], password [facility] 11/17/2015, 11:47 AM  LOS: 0 days

## 2015-11-17 NOTE — Progress Notes (Signed)
Rx Brief note:  Lovenox Wt=42 kg CrCl~53 ml/min Rx reduced Lovenox to 30mg  daily in pt with Wt<45 kg  Thanks Dorrene German 11/17/2015 6:05 AM

## 2015-11-17 NOTE — Care Management Note (Signed)
Case Management Note  Patient Details  Name: Holly Phelps MRN: [id number] Date of Birth: 1961/06/18  Subjective/Objective: 54 y/o f admitted w/n/v/d. From home.                   Action/Plan:d/c plan home.   Expected Discharge Date:                 Expected Discharge Plan:  Home/Self Care  In-House Referral:     Discharge planning Services  CM Consult  Post Acute Care Choice:    Choice offered to:     DME Arranged:    DME Agency:     HH Arranged:    HH Agency:     Status of Service:  In process, will continue to follow  Medicare Important Message Given:    Date Medicare IM Given:    Medicare IM give by:    Date Additional Medicare IM Given:    Additional Medicare Important Message give by:     If discussed at [place] of Stay Meetings, dates discussed:    Additional Comments:  Dessa Phi, RN 11/17/2015, 1:15 PM

## 2015-11-18 DIAGNOSIS — D72829 Elevated white blood cell count, unspecified: Secondary | ICD-10-CM | POA: Diagnosis not present

## 2015-11-18 DIAGNOSIS — I700 Atherosclerosis of aorta: Secondary | ICD-10-CM | POA: Diagnosis present

## 2015-11-18 DIAGNOSIS — D539 Nutritional anemia, unspecified: Secondary | ICD-10-CM | POA: Diagnosis present

## 2015-11-18 DIAGNOSIS — F17210 Nicotine dependence, cigarettes, uncomplicated: Secondary | ICD-10-CM | POA: Diagnosis present

## 2015-11-18 DIAGNOSIS — D638 Anemia in other chronic diseases classified elsewhere: Secondary | ICD-10-CM | POA: Diagnosis present

## 2015-11-18 DIAGNOSIS — E876 Hypokalemia: Secondary | ICD-10-CM

## 2015-11-18 DIAGNOSIS — R197 Diarrhea, unspecified: Secondary | ICD-10-CM

## 2015-11-18 DIAGNOSIS — F17200 Nicotine dependence, unspecified, uncomplicated: Secondary | ICD-10-CM

## 2015-11-18 DIAGNOSIS — E43 Unspecified severe protein-calorie malnutrition: Secondary | ICD-10-CM

## 2015-11-18 DIAGNOSIS — Z681 Body mass index (BMI) 19 or less, adult: Secondary | ICD-10-CM | POA: Diagnosis not present

## 2015-11-18 DIAGNOSIS — A047 Enterocolitis due to Clostridium difficile: Secondary | ICD-10-CM | POA: Diagnosis present

## 2015-11-18 DIAGNOSIS — E872 Acidosis: Secondary | ICD-10-CM | POA: Diagnosis present

## 2015-11-18 DIAGNOSIS — R112 Nausea with vomiting, unspecified: Secondary | ICD-10-CM | POA: Diagnosis present

## 2015-11-18 DIAGNOSIS — Z823 Family history of stroke: Secondary | ICD-10-CM | POA: Diagnosis not present

## 2015-11-18 DIAGNOSIS — A0471 Enterocolitis due to Clostridium difficile, recurrent: Secondary | ICD-10-CM | POA: Diagnosis present

## 2015-11-18 DIAGNOSIS — Z79899 Other long term (current) drug therapy: Secondary | ICD-10-CM | POA: Diagnosis not present

## 2015-11-18 DIAGNOSIS — I10 Essential (primary) hypertension: Secondary | ICD-10-CM | POA: Diagnosis present

## 2015-11-18 DIAGNOSIS — E860 Dehydration: Secondary | ICD-10-CM | POA: Diagnosis present

## 2015-11-18 LAB — OCCULT BLOOD X 1 CARD TO LAB, STOOL
Fecal Occult Bld: POSITIVE — AB
Fecal Occult Bld: POSITIVE — AB
Fecal Occult Bld: POSITIVE — AB

## 2015-11-18 LAB — COMPREHENSIVE METABOLIC PANEL
ALT: 27 U/L (ref 14–54)
AST: 66 U/L — ABNORMAL HIGH (ref 15–41)
Albumin: 2.2 g/dL — ABNORMAL LOW (ref 3.5–5.0)
Alkaline Phosphatase: 227 U/L — ABNORMAL HIGH (ref 38–126)
Anion gap: 11 (ref 5–15)
BUN: <5 mg/dL — ABNORMAL LOW (ref 6–20)
CO2: 21 mmol/L — ABNORMAL LOW (ref 22–32)
Calcium: 6.6 mg/dL — ABNORMAL LOW (ref 8.9–10.3)
Chloride: 103 mmol/L (ref 101–111)
Creatinine, Ser: 0.31 mg/dL — ABNORMAL LOW (ref 0.44–1.00)
GFR calc Af Amer: >60 mL/min (ref >60)
GFR calc non Af Amer: >60 mL/min (ref >60)
Glucose, Bld: 95 mg/dL (ref 65–99)
Potassium: <2 mmol/L — CL (ref 3.5–5.1)
Sodium: 135 mmol/L (ref 135–145)
Total Bilirubin: 0.8 mg/dL (ref 0.3–1.2)
Total Protein: 4.8 g/dL — ABNORMAL LOW (ref 6.5–8.1)

## 2015-11-18 LAB — CBC
HCT: 21.8 % — ABNORMAL LOW (ref 36.0–46.0)
Hemoglobin: 7.5 g/dL — ABNORMAL LOW (ref 12.0–15.0)
MCH: 34.6 pg — ABNORMAL HIGH (ref 26.0–34.0)
MCHC: 34.4 g/dL (ref 30.0–36.0)
MCV: 100.5 fL — ABNORMAL HIGH (ref 78.0–100.0)
Platelets: 301 10*3/uL (ref 150–400)
RBC: 2.17 MIL/uL — ABNORMAL LOW (ref 3.87–5.11)
RDW: 15.5 % (ref 11.5–15.5)
WBC: 22.3 10*3/uL — ABNORMAL HIGH (ref 4.0–10.5)

## 2015-11-18 LAB — MAGNESIUM: Magnesium: 1.1 mg/dL — ABNORMAL LOW (ref 1.7–2.4)

## 2015-11-18 MED ORDER — MAGNESIUM SULFATE 2 GM/50ML IV SOLN
2.0000 g | Freq: Once | INTRAVENOUS | Status: AC
  Administered 2015-11-18: 2 g via INTRAVENOUS
  Filled 2015-11-18: qty 50

## 2015-11-18 MED ORDER — POTASSIUM CHLORIDE CRYS ER 20 MEQ PO TBCR
40.0000 meq | EXTENDED_RELEASE_TABLET | ORAL | Status: AC
  Administered 2015-11-18 (×3): 40 meq via ORAL
  Filled 2015-11-18 (×3): qty 2

## 2015-11-18 MED ORDER — POTASSIUM CHLORIDE IN NACL 40-0.9 MEQ/L-% IV SOLN
INTRAVENOUS | Status: DC
  Administered 2015-11-18 – 2015-11-19 (×3): 100 mL/h via INTRAVENOUS
  Filled 2015-11-18 (×4): qty 1000

## 2015-11-18 MED ORDER — POTASSIUM CHLORIDE 10 MEQ/100ML IV SOLN
10.0000 meq | INTRAVENOUS | Status: AC
  Administered 2015-11-18 (×5): 10 meq via INTRAVENOUS
  Filled 2015-11-18 (×3): qty 100

## 2015-11-18 NOTE — Progress Notes (Signed)
CRITICAL VALUE ALERT  Critical value received:  Stool occult positive  Date of notification:  12/30  Time of notification:  1700  Critical value read back:No.  Nurse who received alert:  Rulon Abide  MD notified (1st page):  Rama  Time of first page:  29  MD notified (2nd page):  Time of second page:  Responding MD:    Time MD responded:

## 2015-11-18 NOTE — Progress Notes (Addendum)
CRITICAL VALUE ALERT  Critical value received:  Potassium < 2.0  Date of notification:  12/30  Time of notification:  0600  Critical value read back:Yes.    Nurse who received alert:  Linden Dolin  MD notified (1st page):  Tylene Fantasia   Time of first page:  0600  MD notified (2nd page): Tylene Fantasia   Time of second page: 0615  Responding MD:  Tylene Fantasia   Time MD responded:  [phone]

## 2015-11-18 NOTE — Progress Notes (Signed)
Progress Note   Holly Phelps [id number] DOB: 1961/03/29 DOA: 11/16/2015 PCP: Kennon Portela, MD   Brief Narrative:   Holly Phelps is an 54 y.o. female with past history of polysubstance abuse and multiple episodes of C. difficile in the past admitted in the early morning of 11/17/15 for 1 day recurrent nausea and vomiting. In the emergency room, patient noted to be significantly hypokalemic as well as with a leukocytosis of 22. Initially, no reports of diarrhea, but subsequently developed diarrhea short time after admission.  Assessment/Plan:   Principal Problem:   Recurrent Clostridium difficile diarrhea/nausea vomiting and diarrhea - Continue oral vancomycin. Will need a slow taper at discharge.  Active Problems:   Hypokalemia/hypomagnesemia - Secondary to GI losses. Supplementing.     Tobacco use disorder - Continue nicotine patch.    Leucocytosis - Secondary to C. difficile.    Protein-calorie malnutrition, severe - Seen by nutrition. Started on boost breeze twice a day.    Macrocytic anemia  - Iron studies, folic acid and 0000000 levels checked 03/13/15. No B-12 or folate deficiencies. - Likely anemia of chronic disease. Check fecal occult blood testing. May have some chronic GI blood loss.    DVT Prophylaxis - Lovenox ordered.   Family Communication/Anticipated D/C date and plan/Code Status   Family Communication: No family at the bedside. Disposition Plan: Home when stable. Anticipated D/C date:   2-3 days if diarrhea resolved and electrolytes normalized. Code Status: Full code.   IV Access:    Peripheral IV   Procedures and diagnostic studies:   US Abdomen Complete  11/17/2015  CLINICAL DATA:  Acute onset of generalized abdominal pain, nausea and vomiting. Initial encounter. EXAM: ABDOMEN ULTRASOUND COMPLETE COMPARISON:  CT of the abdomen and pelvis performed 10/01/2015, and abdominal ultrasound performed 04/24/2015 FINDINGS:  Gallbladder: No gallstones or wall thickening visualized. No sonographic Murphy sign noted by sonographer. Common bile duct: Diameter: 0.3 cm, within normal limits in caliber. Liver: No focal lesion identified. Diffusely increased parenchymal echogenicity and coarsened echotexture likely reflects fatty infiltration. IVC: No abnormality visualized. Pancreas: Visualized portion unremarkable. Spleen: Size and appearance within normal limits. Right Kidney: Length: 10.4 cm. Echogenicity within normal limits. No mass or hydronephrosis visualized. Left Kidney: Length: 10.6 cm. Echogenicity within normal limits. No mass or hydronephrosis visualized. Abdominal aorta: No aneurysm visualized. Scattered calcification is noted along the abdominal aorta. Other findings: None. IMPRESSION: 1. No acute abnormality seen within the abdomen. 2. Diffuse fatty infiltration within the liver. 3. Scattered calcification along the abdominal aorta. Electronically Signed   By: Garald Balding M.D.   On: 11/17/2015 04:33   Dg Abd Acute W/chest  11/16/2015  CLINICAL DATA:  54 year old female with abdominal pain and leukocytosis EXAM: DG ABDOMEN ACUTE W/ 1V CHEST COMPARISON:  CT dated 10/01/2015 FINDINGS: The lungs are clear. No pleural effusion or pneumothorax. The osseous structures appear unremarkable. Cervical spine fixation plate and screws. There is no evidence of bowel obstruction. No radiopaque calculus or foreign object. There is degenerative changes of the lower lumbar spine. No acute fracture. IMPRESSION: Negative abdominal radiographs.  No acute cardiopulmonary disease. Electronically Signed   By: Anner Crete M.D.   On: 11/16/2015 20:36     Medical Consultants:    None.  Anti-Infectives:    Oral vancomycin 11/17/15--->   Subjective:   Dixon Boos and some nausea, vomiting and diarrhea yesterday, but has not had any so far today. She's been running low-grade fevers.  Objective:  Filed Vitals:    11/17/15 0538 11/17/15 1421 11/17/15 2200 11/18/15 0529  BP: 112/74 123/69 113/71   Pulse: 95 115 105 96  Temp: 98.1 F (36.7 C) 100.3 F (37.9 C) 100.1 F (37.8 C) 99.2 F (37.3 C)  TempSrc: Oral Oral Oral Axillary  Resp: 18 16 18 20   Height:      Weight:      SpO2: 100% 100% 100% 100%    Intake/Output Summary (Last 24 hours) at 11/18/15 0752 Last data filed at 11/18/15 0700  Gross per 24 hour  Intake    560 ml  Output   1650 ml  Net  -1090 ml   Filed Weights   11/17/15 0537  Weight: 42.366 kg (93 lb 6.4 oz)    Exam: Gen:  NAD Cardiovascular:  Mildly tachycardic, No M/R/G Respiratory:  Lungs CTAB Gastrointestinal:  Abdomen soft, NT/ND, + BS Extremities:  No C/E/C   Data Reviewed:    Labs: Basic Metabolic Panel:  Recent Labs Lab 11/16/15 1322 11/16/15 1834 11/17/15 0039 11/17/15 0629 11/18/15 0505  NA 132* 129* 135 136 135  K 3.6 3.3* 2.4* 3.2* <2.0*  CL 91* 94* 104 106 103  CO2 16* 14* 16* 16* 21*  GLUCOSE 87 100* 99 81 95  BUN 2* <5* <5* <5* <5*  CREATININE 0.52 0.66 0.52 0.47 0.31*  CALCIUM 8.2* 8.4* 7.5* 6.7* 6.6*  MG  --   --   --  0.9* 1.1*   GFR Estimated Creatinine Clearance: 53.8 mL/min (by C-G formula based on Cr of 0.31). Liver Function Tests:  Recent Labs Lab 11/16/15 1322 11/16/15 1834 11/17/15 0629 11/18/15 0505  AST 111* 120* 116* 66*  ALT 30* 33 29 27  ALKPHOS 278* 290* 261* 227*  BILITOT 0.5 1.2 0.8 0.8  PROT 6.6 7.7 5.8* 4.8*  ALBUMIN 3.4* 3.5 2.7* 2.2*    Recent Labs Lab 11/16/15 1322 11/16/15 1834  LIPASE <5* 20  AMYLASE 18  --    No results for input(s): AMMONIA in the last 168 hours. Coagulation profile No results for input(s): INR, PROTIME in the last 168 hours.  CBC:  Recent Labs Lab 11/16/15 1335 11/16/15 1834 11/17/15 0629 11/18/15 0505  WBC 22.0* 22.8* 21.1* 22.3*  NEUTROABS  --  19.1* 18.6*  --   HGB 9.6* 10.2* 8.3* 7.5*  HCT 28.3* 30.4* 25.4* 21.8*  MCV 101.5* 104.1* 105.4* 100.5*  PLT  --   451* 343 301   Cardiac Enzymes: No results for input(s): CKTOTAL, CKMB, CKMBINDEX, TROPONINI in the last 168 hours. BNP (last 3 results) No results for input(s): PROBNP in the last 8760 hours. CBG: No results for input(s): GLUCAP in the last 168 hours. D-Dimer: No results for input(s): DDIMER in the last 72 hours. Hgb A1c:  Recent Labs  11/16/15 1339  HGBA1C 4.6   Lipid Profile: No results for input(s): CHOL, HDL, LDLCALC, TRIG, CHOLHDL, LDLDIRECT in the last 72 hours. Thyroid function studies: No results for input(s): TSH, T4TOTAL, T3FREE, THYROIDAB in the last 72 hours.  Invalid input(s): FREET3 Anemia work up: No results for input(s): VITAMINB12, FOLATE, FERRITIN, TIBC, IRON, RETICCTPCT in the last 72 hours. Sepsis Labs:  Recent Labs Lab 11/16/15 1335 11/16/15 1834 11/16/15 2116 11/17/15 0629 11/18/15 0505  WBC 22.0* 22.8*  --  21.1* 22.3*  LATICACIDVEN  --   --  0.63  --   --    Microbiology Recent Results (from the past 240 hour(s))  C difficile quick scan w PCR reflex  Status: Abnormal   Collection Time: 11/17/15  2:18 PM  Result Value Ref Range Status   C Diff antigen POSITIVE (A) NEGATIVE Final   C Diff toxin POSITIVE (A) NEGATIVE Final   C Diff interpretation Positive for toxigenic C. difficile  Final    Comment: CRITICAL RESULT CALLED TO, READ BACK BY AND VERIFIED WITH: HUFF,J @ 1613 ON [id number] BY POTEAT,S      Medications:   . enoxaparin (LOVENOX) injection  30 mg Subcutaneous Daily  . feeding supplement  1 Container Oral BID BM  . ferrous gluconate  324 mg Oral BID  . folic acid  1 mg Oral Daily  . multivitamin with minerals  1 tablet Oral Daily  . potassium chloride  40 mEq Oral Q4H  . thiamine  100 mg Oral Daily   Or  . thiamine  100 mg Intravenous Daily  . vancomycin  125 mg Oral QID   Continuous Infusions:   Time spent: 25 minutes.   LOS: 1 day   Ryland Tungate  [facility] Pager [phone]. If unable to reach me by  pager, please call my cell phone at [phone].  *Please refer to [email or web address], password [email or web address] to get updated schedule on who will round on this patient, as hospitalists switch teams weekly. If 7PM-7AM, please contact night-coverage at [email or web address], password [email or web address] for any overnight needs.  11/18/2015, 7:52 AM          \

## 2015-11-19 ENCOUNTER — Encounter (HOSPITAL_COMMUNITY): Payer: Self-pay | Admitting: Internal Medicine

## 2015-11-19 DIAGNOSIS — I700 Atherosclerosis of aorta: Secondary | ICD-10-CM

## 2015-11-19 LAB — CBC
HCT: 23.1 % — ABNORMAL LOW (ref 36.0–46.0)
Hemoglobin: 7.8 g/dL — ABNORMAL LOW (ref 12.0–15.0)
MCH: 34.8 pg — ABNORMAL HIGH (ref 26.0–34.0)
MCHC: 33.8 g/dL (ref 30.0–36.0)
MCV: 103.1 fL — ABNORMAL HIGH (ref 78.0–100.0)
Platelets: 337 10*3/uL (ref 150–400)
RBC: 2.24 MIL/uL — ABNORMAL LOW (ref 3.87–5.11)
RDW: 16.3 % — ABNORMAL HIGH (ref 11.5–15.5)
WBC: 16.2 10*3/uL — ABNORMAL HIGH (ref 4.0–10.5)

## 2015-11-19 LAB — MAGNESIUM: Magnesium: 1.5 mg/dL — ABNORMAL LOW (ref 1.7–2.4)

## 2015-11-19 LAB — BASIC METABOLIC PANEL
Anion gap: 8 (ref 5–15)
BUN: <5 mg/dL — ABNORMAL LOW (ref 6–20)
CO2: 18 mmol/L — ABNORMAL LOW (ref 22–32)
Calcium: 7.1 mg/dL — ABNORMAL LOW (ref 8.9–10.3)
Chloride: 107 mmol/L (ref 101–111)
Creatinine, Ser: <0.3 mg/dL — ABNORMAL LOW (ref 0.44–1.00)
Glucose, Bld: 79 mg/dL (ref 65–99)
Potassium: 4.2 mmol/L (ref 3.5–5.1)
Sodium: 133 mmol/L — ABNORMAL LOW (ref 135–145)

## 2015-11-19 MED ORDER — VANCOMYCIN 50 MG/ML ORAL SOLUTION: 125.0000 mg | Freq: Four times a day (QID) | ORAL | Status: DC

## 2015-11-19 MED ORDER — POTASSIUM CHLORIDE ER 10 MEQ PO TBCR
10.0000 meq | EXTENDED_RELEASE_TABLET | Freq: Three times a day (TID) | ORAL | Status: DC
Start: 2015-11-19 — End: 2016-09-05

## 2015-11-19 MED ORDER — BOOST / RESOURCE BREEZE PO LIQD: 1.0000 | Freq: Two times a day (BID) | ORAL | Status: DC

## 2015-11-19 MED ORDER — MAGNESIUM SULFATE 4 GM/100ML IV SOLN
4.0000 g | Freq: Once | INTRAVENOUS | Status: AC
  Administered 2015-11-19: 4 g via INTRAVENOUS
  Filled 2015-11-19: qty 100

## 2015-11-19 MED ORDER — ADULT MULTIVITAMIN W/MINERALS CH: 1.0000 | ORAL_TABLET | Freq: Every day | ORAL | Status: DC

## 2015-11-19 NOTE — Discharge Summary (Signed)
Physician Discharge Summary  GRACIOUS LIEBENOW [id number] DOB: Jul 01, 1961 DOA: 11/16/2015  PCP: Kennon Portela, MD  Admit date: 11/16/2015 Discharge date: 11/19/2015   Recommendations for Outpatient Follow-Up:   1. Discharged on oral vancomycin, slow taper over 2 months. 2. Consider GI referral for fecal transplant. 3. Note: Fecal occult blood testing positive. Will need close follow-up of hemoglobin and GI for consideration of colonoscopy once Clostridium difficile infection eradicated.   Discharge Diagnosis:   Principal Problem:    Recurrent Clostridium difficile diarrhea Active Problems:    Nausea vomiting and diarrhea    Hypokalemia    Tobacco use disorder    Hypomagnesemia    Leucocytosis    Protein-calorie malnutrition, severe    Macrocytic anemia    Nausea and vomiting    Calcification of aorta   Discharge disposition:  Home.    Discharge Condition: Improved.  Diet recommendation:Regular.   History of Present Illness:   Holly Phelps is an 54 y.o. female with past history of polysubstance abuse and multiple episodes of C. difficile in the past admitted in the early morning of 11/17/15 for 1 day recurrent nausea and vomiting. In the emergency room, patient noted to be significantly hypokalemic as well as with a leukocytosis of 22. Initially, no reports of diarrhea, but subsequently developed diarrhea short time after admission. C. difficile testing was positive.  Hospital Course by Problem:   Principal Problem:  Recurrent Clostridium difficile diarrhea/nausea vomiting and diarrhea - Continue oral vancomycin. Will need a slow taper at discharge. - Consider GI referral for fecal transplant given that this is her third episode.  Active Problems:  Hypokalemia/hypomagnesemia - Secondary to GI losses. Received aggressive supplementation 11/18/15.  - Potassium normalized, given 4 g of magnesium prior to discharge. - We'll discharge home  on potassium supplementation with instructions to take while still having diarrhea.   Tobacco use disorder - Continue nicotine patch.   Leucocytosis - Secondary to C. difficile.   Protein-calorie malnutrition, severe - Seen by nutrition. Started on boost breeze twice a day.   Macrocytic anemia with fecal occult positive stools  - Iron studies, folic acid and 0000000 levels checked 03/13/15. No B-12 or folate deficiencies. - Likely anemia of chronic disease and chronic GI blood loss from recurrent C. difficile infections. - Continue iron supplementation.    Calcification of aorta - Would not start aspirin at this time due to Hemoccult-positive stools.  Medical Consultants:    None.   Discharge Exam:   Filed Vitals:   11/18/15 2015 11/19/15 0548  BP: 96/61 100/65  Pulse: 108 100  Temp: 98.4 F (36.9 C) 98.2 F (36.8 C)  Resp: 18 18   Filed Vitals:   11/18/15 0529 11/18/15 1336 11/18/15 2015 11/19/15 0548  BP:  117/65 96/61 100/65  Pulse: 96 106 108 100  Temp: 99.2 F (37.3 C) 98.6 F (37 C) 98.4 F (36.9 C) 98.2 F (36.8 C)  TempSrc: Axillary Axillary Oral Oral  Resp: 20 18 18 18   Height:      Weight:      SpO2: 100% 100% 100% 100%    Gen:  NAD Cardiovascular:  RRR, No M/R/G Respiratory: Lungs CTAB Gastrointestinal: Abdomen soft, NT/ND with normal active bowel sounds. Extremities: No C/E/C   The results of significant diagnostics from this hospitalization (including imaging, microbiology, ancillary and laboratory) are listed below for reference.     Procedures and Diagnostic Studies:   US Abdomen Complete  11/17/2015  CLINICAL DATA:  Acute  onset of generalized abdominal pain, nausea and vomiting. Initial encounter. EXAM: ABDOMEN ULTRASOUND COMPLETE COMPARISON:  CT of the abdomen and pelvis performed 10/01/2015, and abdominal ultrasound performed 04/24/2015 FINDINGS: Gallbladder: No gallstones or wall thickening visualized. No sonographic Murphy sign  noted by sonographer. Common bile duct: Diameter: 0.3 cm, within normal limits in caliber. Liver: No focal lesion identified. Diffusely increased parenchymal echogenicity and coarsened echotexture likely reflects fatty infiltration. IVC: No abnormality visualized. Pancreas: Visualized portion unremarkable. Spleen: Size and appearance within normal limits. Right Kidney: Length: 10.4 cm. Echogenicity within normal limits. No mass or hydronephrosis visualized. Left Kidney: Length: 10.6 cm. Echogenicity within normal limits. No mass or hydronephrosis visualized. Abdominal aorta: No aneurysm visualized. Scattered calcification is noted along the abdominal aorta. Other findings: None. IMPRESSION: 1. No acute abnormality seen within the abdomen. 2. Diffuse fatty infiltration within the liver. 3. Scattered calcification along the abdominal aorta. Electronically Signed   By: Garald Balding M.D.   On: 11/17/2015 04:33   Dg Abd Acute W/chest  11/16/2015  CLINICAL DATA:  54 year old female with abdominal pain and leukocytosis EXAM: DG ABDOMEN ACUTE W/ 1V CHEST COMPARISON:  CT dated 10/01/2015 FINDINGS: The lungs are clear. No pleural effusion or pneumothorax. The osseous structures appear unremarkable. Cervical spine fixation plate and screws. There is no evidence of bowel obstruction. No radiopaque calculus or foreign object. There is degenerative changes of the lower lumbar spine. No acute fracture. IMPRESSION: Negative abdominal radiographs.  No acute cardiopulmonary disease. Electronically Signed   By: Anner Crete M.D.   On: 11/16/2015 20:36     Labs:   Basic Metabolic Panel:  Recent Labs Lab 11/16/15 1834 11/17/15 0039 11/17/15 0629 11/18/15 0505 11/19/15 0516  NA 129* 135 136 135 133*  K 3.3* 2.4* 3.2* <2.0* 4.2  CL 94* 104 106 103 107  CO2 14* 16* 16* 21* 18*  GLUCOSE 100* 99 81 95 79  BUN <5* <5* <5* <5* <5*  CREATININE 0.66 0.52 0.47 0.31* <0.30*  CALCIUM 8.4* 7.5* 6.7* 6.6* 7.1*  MG  --    --  0.9* 1.1* 1.5*   GFR CrCl cannot be calculated (Patient has no serum creatinine result on file.). Liver Function Tests:  Recent Labs Lab 11/16/15 1322 11/16/15 1834 11/17/15 0629 11/18/15 0505  AST 111* 120* 116* 66*  ALT 30* 33 29 27  ALKPHOS 278* 290* 261* 227*  BILITOT 0.5 1.2 0.8 0.8  PROT 6.6 7.7 5.8* 4.8*  ALBUMIN 3.4* 3.5 2.7* 2.2*    Recent Labs Lab 11/16/15 1322 11/16/15 1834  LIPASE <5* 20  AMYLASE 18  --    CBC:  Recent Labs Lab 11/16/15 1335 11/16/15 1834 11/17/15 0629 11/18/15 0505 11/19/15 0516  WBC 22.0* 22.8* 21.1* 22.3* 16.2*  NEUTROABS  --  19.1* 18.6*  --   --   HGB 9.6* 10.2* 8.3* 7.5* 7.8*  HCT 28.3* 30.4* 25.4* 21.8* 23.1*  MCV 101.5* 104.1* 105.4* 100.5* 103.1*  PLT  --  451* 343 301 337   Microbiology Recent Results (from the past 240 hour(s))  C difficile quick scan w PCR reflex     Status: Abnormal   Collection Time: 11/17/15  2:18 PM  Result Value Ref Range Status   C Diff antigen POSITIVE (A) NEGATIVE Final   C Diff toxin POSITIVE (A) NEGATIVE Final   C Diff interpretation Positive for toxigenic C. difficile  Final    Comment: CRITICAL RESULT CALLED TO, READ BACK BY AND VERIFIED WITH: HUFF,J @ 1613 ON [id number]  BY POTEAT,S      Discharge Instructions:   Discharge Instructions    Call MD for:  extreme fatigue    Complete by:  As directed      Call MD for:  persistant dizziness or light-headedness    Complete by:  As directed      Call MD for:  persistant nausea and vomiting    Complete by:  As directed      Call MD for:  severe uncontrolled pain    Complete by:  As directed      Call MD for:    Complete by:  As directed   Non-resolution of diarrhea, black or bloody stools.     Diet general    Complete by:  As directed      Discharge instructions    Complete by:  As directed   Take vancomycin 4 x a day for 2 weeks, then 3 x a day for 2 weeks, then 2 x a day for 2 weeks, then daily x 2 weeks, then once every other  day for two weeks.     Increase activity slowly    Complete by:  As directed             Medication List    STOP taking these medications        ondansetron 4 MG tablet  Commonly known as:  ZOFRAN      TAKE these medications        Cranberry 500 MG Chew  Chew 1 tablet by mouth daily.     feeding supplement Liqd  Take 1 Container by mouth 2 (two) times daily between meals.     ferrous gluconate 240 (27 FE) MG tablet  Commonly known as:  FERGON  Take 240 mg by mouth 2 (two) times daily.     ibuprofen 200 MG tablet  Commonly known as:  ADVIL,MOTRIN  Take 200-400 mg by mouth every 6 (six) hours as needed for moderate pain.     multivitamin with minerals Tabs tablet  Take 1 tablet by mouth daily.     ondansetron 4 MG disintegrating tablet  Commonly known as:  ZOFRAN ODT  Take 1 tablet (4 mg total) by mouth every 8 (eight) hours as needed for nausea or vomiting.     potassium chloride 10 MEQ tablet  Commonly known as:  K-DUR  Take 1 tablet (10 mEq total) by mouth 3 (three) times daily. Take for as long as you are having loose stools.     thiamine 100 MG tablet  Take 1 tablet (100 mg total) by mouth daily.     vancomycin 50 mg/mL oral solution  Commonly known as:  VANCOCIN  Take 2.5 mLs (125 mg total) by mouth 4 (four) times daily. Take 4 x/day for 2 wks, then 3 x/day for 2 wks, then 2 x/day for 2 wks, then daily x 2 wks, then QOD x 2 wks.           Follow-up Information    Follow up with GUEST, Veneda Melter, MD. Schedule an appointment as soon as possible for a visit in 1 week.   Specialty:  Internal Medicine   Why:  Hospital follow up, to check labs.   Contact information:   [place] [id number] [phone]        Time coordinating discharge: 35 minutes.  Signed:  Scout Guyett  Pager [phone] [facility] 11/19/2015, 2:02 PM

## 2015-11-19 NOTE — Discharge Instructions (Signed)

## 2016-01-12 ENCOUNTER — Encounter: Payer: Self-pay | Admitting: Physician Assistant

## 2016-01-12 DIAGNOSIS — Z23 Encounter for immunization: Secondary | ICD-10-CM | POA: Insufficient documentation

## 2016-01-12 DIAGNOSIS — R945 Abnormal results of liver function studies: Secondary | ICD-10-CM

## 2016-01-12 DIAGNOSIS — A0471 Enterocolitis due to Clostridium difficile, recurrent: Secondary | ICD-10-CM

## 2016-01-12 DIAGNOSIS — R7989 Other specified abnormal findings of blood chemistry: Secondary | ICD-10-CM

## 2016-02-06 ENCOUNTER — Ambulatory Visit (INDEPENDENT_AMBULATORY_CARE_PROVIDER_SITE_OTHER): Payer: BLUE CROSS/BLUE SHIELD | Admitting: Emergency Medicine

## 2016-02-06 DIAGNOSIS — S50812A Abrasion of left forearm, initial encounter: Secondary | ICD-10-CM

## 2016-02-06 DIAGNOSIS — S60512A Abrasion of left hand, initial encounter: Secondary | ICD-10-CM | POA: Diagnosis not present

## 2016-02-06 DIAGNOSIS — W5503XA Scratched by cat, initial encounter: Secondary | ICD-10-CM

## 2016-02-06 MED ORDER — MUPIROCIN CALCIUM 2 % EX CREA: TOPICAL_CREAM | CUTANEOUS | Status: DC

## 2016-02-06 MED ORDER — AMOXICILLIN 500 MG PO CAPS: ORAL_CAPSULE | ORAL | Status: DC

## 2016-02-06 NOTE — Patient Instructions (Addendum)
     IF you received an x-ray today, you will receive an invoice from [facility]. Please contact [facility] at [phone] with questions or concerns regarding your invoice.   IF you received labwork today, you will receive an invoice from [employer]. Please contact [facility] at [phone] with questions or concerns regarding your invoice.   Our billing staff will not be able to assist you with questions regarding bills from these companies.  You will be contacted with the lab results as soon as they are available. The fastest way to get your results is to activate your My Chart account. Instructions are located on the last page of this paperwork. If you have not heard from [place] regarding the results in 2 weeks, please contact this office.    Please clean the area on your left hand and forearm 3 times a day. Apply Bactroban ointment 3 times a day. Recheck in 48 hours  You have a prescription for amoxicillin to fill if you have significant worsening in the cellulitis. Be sure you see me Wednesday morning.

## 2016-02-06 NOTE — Progress Notes (Signed)
Patient ID: Holly Phelps, female   DOB: 1961-09-12, 55 y.o.   MRN: [id number]    By signing my name below, I, [name], attest that this documentation has been prepared under the direction and in the presence of [name], MD Electronically Signed: Ladene Artist, ED Scribe 02/06/2016 at 11:23 AM.  Chief Complaint:  Chief Complaint  Patient presents with  . Cat scratch    Left , Pt unsure when last Tetanus shot was   HPI: Holly Phelps is a 55 y.o. female who reports to [facility] today complaining of a cat scratch to the left palm and forearm sustained 2 days ago. Pt states that she unintentionally rolled over on her cat in her sleep 2 nights ago when she was scratched. No active bleeding noted. The pet is UTD on her vaccines. Pt's last tdap was 11/20/11. She denies fever, chills. Allergy to sulfa antibiotics.   Past Medical History  Diagnosis Date  . Allergy   . Anemia   . 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 Hypertension   . Hypothyroidism   . GERD (gastroesophageal reflux disease)   . Headache   . Calcification of aorta (HCC)   . Fatty liver    Past Surgical History  Procedure Laterality Date  . Laparoscopic appendectomy    . Back surgery      x 2  . Colonoscopy N/A 03/13/2013    Procedure: COLONOSCOPY;  Surgeon: Leighton Ruff, MD;  Location: [facility];  Service: Endoscopy;  Laterality: N/A;   Social History   Social History  . Marital Status: Married    Spouse Name: [name]  . Number of Children: 0  . Years of Education: College   Occupational History  . retired Teaching laboratory technician work    Social History Main Topics  . Smoking status: Current Every Day Smoker -- 0.20 packs/day    Types: Cigarettes  . Smokeless tobacco: Never Used     Comment: 3 cigarettes per day  . Alcohol Use: 6.0 oz/week    10 Glasses of wine per week     Comment: working to reduce this due to illness  . Drug Use: Yes    Special: Cocaine     Comment: once every few weeks  . Sexual Activity: Not  Currently   Other Topics Concern  . None   Social History Narrative   Lives with her husband.   No children.   Family History  Problem Relation Age of Onset  . Stroke Father   . Stroke Brother   . Stroke Brother    Allergies  Allergen Reactions  . Codeine Nausea And Vomiting  . Flexeril [Cyclobenzaprine] Itching  . Sulfa Antibiotics     Pt does not recall what the reaction was,been too long  . Synthroid [Levothyroxine] Rash   Prior to Admission medications   Medication Sig Start Date End Date Taking? Authorizing Provider  Cranberry 500 MG CHEW Chew 1 tablet by mouth daily.    Yes Historical Provider, MD  ferrous gluconate (FERGON) 240 (27 FE) MG tablet Take 240 mg by mouth 2 (two) times daily.    Yes Historical Provider, MD  ibuprofen (ADVIL,MOTRIN) 200 MG tablet Take 200-400 mg by mouth every 6 (six) hours as needed for moderate pain.   Yes Historical Provider, MD  Multiple Vitamin (MULTIVITAMIN WITH MINERALS) TABS Take 1 tablet by mouth daily. 01/29/13  Yes [name], MD  potassium chloride (K-DUR) 10 MEQ tablet Take 1 tablet (10 mEq  total) by mouth 3 (three) times daily. Take for as long as you are having loose stools. 11/19/15  Yes [name], MD  feeding supplement (BOOST / RESOURCE BREEZE) LIQD Take 1 Container by mouth 2 (two) times daily between meals. Patient not taking: Reported on 02/06/2016 11/19/15   [name], MD  thiamine 100 MG tablet Take 1 tablet (100 mg total) by mouth daily. Patient not taking: Reported on 02/06/2016 04/27/15   [name], DO   ROS: The patient denies fevers, chills, night sweats, unintentional weight loss, chest pain, palpitations, wheezing, dyspnea on exertion, nausea, vomiting, abdominal pain, dysuria, hematuria, melena, numbness, weakness, or tingling. +wound  All other systems have been reviewed and were otherwise negative with the exception of those mentioned in the HPI and as above.    PHYSICAL EXAM: Filed Vitals:     02/06/16 1017  BP: 120/82  Pulse: 63  Temp: 97.9 F (36.6 C)  Resp: 14   Body mass index is 17.59 kg/(m^2).  General: Alert, no acute distress HEENT:  Normocephalic, atraumatic, oropharynx patent. Eye: [name] [facility] Cardiovascular: Regular rate and rhythm, no rubs murmurs or gallops. No Carotid bruits, radial pulse intact. No pedal edema.  Respiratory: Clear to auscultation bilaterally. No wheezes, rales, or rhonchi. No cyanosis, no use of accessory musculature Abdominal: No organomegaly, abdomen is soft and non-tender, positive bowel sounds. No masses. Musculoskeletal: Gait intact. No edema, tenderness Skin: Over the hypothenar eminence 1 scratch is 0.5 cm, 2 vertical scratches measuring 2 cm and 3 cm. Appears to be another scratch over the distal radial side of the forearm that measures 2 cm.  Neurologic: Facial musculature symmetric. Psychiatric: Patient acts appropriately throughout our interaction. Lymphatic: No cervical or submandibular lymphadenopathy  LABS:  EKG/XRAY:   Primary read interpreted by Dr. [name] at [facility]  ASSESSMENT/PLAN: Patient has linear scratches over the hyperthenar area of her left hand. I'm very concerned in that patient was in the hospital 4 months ago and treated for Clostridium difficile with vancomycin. I am hoping we can clear her infection with topical treatment only. She will clean the area with soap and water 3 times a day followed by application of Bactroban ointment. She she will return to clinic if worsening of the redness or redness extending up her forearm. I discussed with her the issue was C. difficile. She understands. She will do all she can do to avoid having to take the oral antibiotics and understands.    Gross sideeffects, risk and benefits, and alternatives of medications d/w patient. Patient is aware that all medications have potential sideeffects and we are unable to predict every sideeffect or drug-drug interaction that may  occur.  [name] MD 02/06/2016 11:11 AM

## 2016-02-07 ENCOUNTER — Telehealth: Payer: Self-pay | Admitting: Family Medicine

## 2016-02-07 NOTE — Telephone Encounter (Signed)
Called patient to see if they have had the flu shot.   If they have where and when?  If not ask them to come in and get it.  If they decline please document it in health maintenance. 

## 2016-02-08 ENCOUNTER — Ambulatory Visit (INDEPENDENT_AMBULATORY_CARE_PROVIDER_SITE_OTHER): Payer: BLUE CROSS/BLUE SHIELD | Admitting: Emergency Medicine

## 2016-02-08 VITALS — BP 148/84 | HR 102 | Temp 98.6°F | Resp 16 | Ht 62.0 in | Wt 95.0 lb

## 2016-02-08 DIAGNOSIS — S60512A Abrasion of left hand, initial encounter: Secondary | ICD-10-CM

## 2016-02-08 DIAGNOSIS — W5503XA Scratched by cat, initial encounter: Secondary | ICD-10-CM | POA: Diagnosis not present

## 2016-02-08 DIAGNOSIS — S50812A Abrasion of left forearm, initial encounter: Secondary | ICD-10-CM

## 2016-02-08 NOTE — Progress Notes (Signed)
By signing my name below, I, Holly Phelps, attest that this documentation has been prepared under the direction and in the presence of [name], MD.  Electronically Signed: Thea Alken, ED Scribe. 02/07/2016. 10:43 AM.   Chief Complaint:  Chief Complaint  Patient presents with  . Follow-up    cat scratch    HPI: Holly Phelps is a 55 y.o. female who reports to [name] today for a follow up regarding a cat scratch. Pt was seen her 2 days after obtaining a cat scratch to the palmar aspect of left hand and wrist 2 days prior. She states wound has been healing well. She has been washing area 3 times a day and applying abx ointment. She has some tenderness to wound but has not noticed red streaking. She did not start amoxicillin. She denies fever or chills.   Past Medical History  Diagnosis Date  . Allergy   . Anemia   . 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 Hypertension   . Hypothyroidism   . GERD (gastroesophageal reflux disease)   . Headache   . Calcification of aorta (HCC)   . Fatty liver    Past Surgical History  Procedure Laterality Date  . Laparoscopic appendectomy    . Back surgery      x 2  . Colonoscopy N/A 03/13/2013    Procedure: COLONOSCOPY;  Surgeon: Leighton Ruff, MD;  Location: [facility];  Service: Endoscopy;  Laterality: N/A;   Social History   Social History  . Marital Status: Married    Spouse Name: [name]  . Number of Children: 0  . Years of Education: College   Occupational History  . retired Teaching laboratory technician work    Social History Main Topics  . Smoking status: Current Every Day Smoker -- 0.20 packs/day    Types: Cigarettes  . Smokeless tobacco: Never Used     Comment: 3 cigarettes per day  . Alcohol Use: 6.0 oz/week    10 Glasses of wine per week     Comment: working to reduce this due to illness  . Drug Use: Yes    Special: Cocaine     Comment: once every few weeks  . Sexual Activity: Not Currently   Other Topics Concern  . None   Social History  Narrative   Lives with her husband.   No children.   Family History  Problem Relation Age of Onset  . Stroke Father   . Stroke Brother   . Stroke Brother    Allergies  Allergen Reactions  . Codeine Nausea And Vomiting  . Flexeril [Cyclobenzaprine] Itching  . Sulfa Antibiotics     Pt does not recall what the reaction was,been too long  . Synthroid [Levothyroxine] Rash   Prior to Admission medications   Medication Sig Start Date End Date Taking? Authorizing Provider  Cranberry 500 MG CHEW Chew 1 tablet by mouth daily.    Yes Historical Provider, MD  ferrous gluconate (FERGON) 240 (27 FE) MG tablet Take 240 mg by mouth 2 (two) times daily.    Yes Historical Provider, MD  ibuprofen (ADVIL,MOTRIN) 200 MG tablet Take 200-400 mg by mouth every 6 (six) hours as needed for moderate pain.   Yes Historical Provider, MD  Multiple Vitamin (MULTIVITAMIN WITH MINERALS) TABS Take 1 tablet by mouth daily. 01/29/13  Yes [name], MD  mupirocin cream (BACTROBAN) 2 % Apply topically 3 times a day. 02/06/16  Yes [name], MD  potassium chloride (  K-DUR) 10 MEQ tablet Take 1 tablet (10 mEq total) by mouth 3 (three) times daily. Take for as long as you are having loose stools. 11/19/15  Yes [name], MD  amoxicillin (AMOXIL) 500 MG capsule 1 tablet twice a day 5 days Patient not taking: Reported on 02/08/2016 02/06/16   [name], MD  feeding supplement (BOOST / RESOURCE BREEZE) LIQD Take 1 Container by mouth 2 (two) times daily between meals. Patient not taking: Reported on 02/06/2016 11/19/15   [name], MD  thiamine 100 MG tablet Take 1 tablet (100 mg total) by mouth daily. Patient not taking: Reported on 02/06/2016 04/27/15   [name], DO     ROS: The patient denies fevers, chills, night sweats, unintentional weight loss, chest pain, palpitations, wheezing, dyspnea on exertion, nausea, vomiting, abdominal pain, dysuria, hematuria, melena, numbness, weakness, or tingling.     All other systems have been reviewed and were otherwise negative with the exception of those mentioned in the HPI and as above.    PHYSICAL EXAM: Filed Vitals:   02/08/16 1031  BP: 148/84  Pulse: 102  Temp: 98.6 F (37 C)  Resp: 16   Body mass index is 17.37 kg/(m^2).   General: Alert, no acute distress HEENT:  Normocephalic, atraumatic, oropharynx patent. Eye: [name] Cardiovascular:  Regular rate and rhythm, no rubs murmurs or gallops.  No Carotid bruits, radial pulse intact. No pedal edema.  Respiratory: Clear to auscultation bilaterally.  No wheezes, rales, or rhonchi.  No cyanosis, no use of accessory musculature Abdominal: No organomegaly, abdomen is soft and non-tender, positive bowel sounds.  No masses. Musculoskeletal: Gait intact. No edema, tenderness Skin: No rashes. Mild redness over the base of 5th finger over the scratch. 2 liner scratch over hyperthenar of left hand are improved with decreased redness.  Neurologic: Facial musculature symmetric. Psychiatric: Patient acts appropriately throughout our interaction. Lymphatic: No cervical or submandibular lymphadenopathy   ASSESSMENT/PLAN: Patient's hand looks better. We'll continue to hold off on antibiotics because of her history of Clostridium difficile. She will continue soap and water cleaning followed by application of Bactroban ointment.I personally performed the services described in this documentation, which was scribed in my presence. The recorded information has been reviewed and is accurate.   Gross sideeffects, risk and benefits, and alternatives of medications d/w patient. Patient is aware that all medications have potential sideeffects and we are unable to predict every sideeffect or drug-drug interaction that may occur.  [name] MD 02/08/2016 10:43 AM

## 2016-02-08 NOTE — Patient Instructions (Signed)
     IF you received an x-ray today, you will receive an invoice from [facility]. Please contact [facility] at [phone] with questions or concerns regarding your invoice.   IF you received labwork today, you will receive an invoice from [facility]. Please contact [facility] at [phone] with questions or concerns regarding your invoice.   Our billing staff will not be able to assist you with questions regarding bills from these companies.  You will be contacted with the lab results as soon as they are available. The fastest way to get your results is to activate your My Chart account. Instructions are located on the last page of this paperwork. If you have not heard from us regarding the results in 2 weeks, please contact this office.      

## 2016-05-18 DIAGNOSIS — H40053 Ocular hypertension, bilateral: Secondary | ICD-10-CM | POA: Diagnosis not present

## 2016-06-22 ENCOUNTER — Emergency Department (HOSPITAL_COMMUNITY): Payer: BLUE CROSS/BLUE SHIELD

## 2016-06-22 ENCOUNTER — Emergency Department (HOSPITAL_COMMUNITY)
Admission: EM | Admit: 2016-06-22 | Discharge: 2016-06-22 | Disposition: A | Discharge status: Home / Self Care | Payer: BLUE CROSS/BLUE SHIELD | Attending: Emergency Medicine | Admitting: Emergency Medicine

## 2016-06-22 ENCOUNTER — Encounter (HOSPITAL_COMMUNITY): Payer: Self-pay | Admitting: Emergency Medicine

## 2016-06-22 DIAGNOSIS — E872 Acidosis: Secondary | ICD-10-CM | POA: Diagnosis not present

## 2016-06-22 DIAGNOSIS — Z789 Other specified health status: Secondary | ICD-10-CM

## 2016-06-22 DIAGNOSIS — K219 Gastro-esophageal reflux disease without esophagitis: Secondary | ICD-10-CM

## 2016-06-22 DIAGNOSIS — E876 Hypokalemia: Secondary | ICD-10-CM | POA: Insufficient documentation

## 2016-06-22 DIAGNOSIS — D72829 Elevated white blood cell count, unspecified: Secondary | ICD-10-CM | POA: Diagnosis not present

## 2016-06-22 DIAGNOSIS — R112 Nausea with vomiting, unspecified: Secondary | ICD-10-CM | POA: Diagnosis not present

## 2016-06-22 DIAGNOSIS — R7989 Other specified abnormal findings of blood chemistry: Secondary | ICD-10-CM

## 2016-06-22 DIAGNOSIS — F17210 Nicotine dependence, cigarettes, uncomplicated: Secondary | ICD-10-CM | POA: Diagnosis not present

## 2016-06-22 DIAGNOSIS — K2970 Gastritis, unspecified, without bleeding: Secondary | ICD-10-CM | POA: Diagnosis not present

## 2016-06-22 DIAGNOSIS — D649 Anemia, unspecified: Secondary | ICD-10-CM | POA: Diagnosis not present

## 2016-06-22 DIAGNOSIS — I10 Essential (primary) hypertension: Secondary | ICD-10-CM | POA: Diagnosis not present

## 2016-06-22 DIAGNOSIS — E860 Dehydration: Secondary | ICD-10-CM | POA: Diagnosis not present

## 2016-06-22 DIAGNOSIS — Z7289 Other problems related to lifestyle: Secondary | ICD-10-CM | POA: Diagnosis not present

## 2016-06-22 DIAGNOSIS — K760 Fatty (change of) liver, not elsewhere classified: Secondary | ICD-10-CM | POA: Diagnosis not present

## 2016-06-22 DIAGNOSIS — N179 Acute kidney failure, unspecified: Secondary | ICD-10-CM | POA: Diagnosis not present

## 2016-06-22 DIAGNOSIS — R0789 Other chest pain: Secondary | ICD-10-CM | POA: Diagnosis not present

## 2016-06-22 DIAGNOSIS — E8729 Other acidosis: Secondary | ICD-10-CM

## 2016-06-22 DIAGNOSIS — Z720 Tobacco use: Secondary | ICD-10-CM

## 2016-06-22 DIAGNOSIS — R945 Abnormal results of liver function studies: Secondary | ICD-10-CM

## 2016-06-22 LAB — URINALYSIS, ROUTINE W REFLEX MICROSCOPIC
Glucose, UA: NEGATIVE mg/dL
Hgb urine dipstick: NEGATIVE
Ketones, ur: >80 mg/dL — AB
Leukocytes, UA: NEGATIVE
Nitrite: NEGATIVE
Protein, ur: 30 mg/dL — AB
Specific Gravity, Urine: 1.019 (ref 1.005–1.030)
pH: 6 (ref 5.0–8.0)

## 2016-06-22 LAB — COMPREHENSIVE METABOLIC PANEL
ALT: 125 U/L — ABNORMAL HIGH (ref 14–54)
ALT: 95 U/L — ABNORMAL HIGH (ref 14–54)
AST: 283 U/L — ABNORMAL HIGH (ref 15–41)
AST: 199 U/L — ABNORMAL HIGH (ref 15–41)
Albumin: 5.6 g/dL — ABNORMAL HIGH (ref 3.5–5.0)
Albumin: 4.2 g/dL (ref 3.5–5.0)
Alkaline Phosphatase: 238 U/L — ABNORMAL HIGH (ref 38–126)
Alkaline Phosphatase: 174 U/L — ABNORMAL HIGH (ref 38–126)
Anion gap: 28 — ABNORMAL HIGH (ref 5–15)
Anion gap: 19 — ABNORMAL HIGH (ref 5–15)
BUN: 13 mg/dL (ref 6–20)
BUN: 10 mg/dL (ref 6–20)
CO2: 13 mmol/L — ABNORMAL LOW (ref 22–32)
CO2: 13 mmol/L — ABNORMAL LOW (ref 22–32)
Calcium: 9.7 mg/dL (ref 8.9–10.3)
Calcium: 7.6 mg/dL — ABNORMAL LOW (ref 8.9–10.3)
Chloride: 93 mmol/L — ABNORMAL LOW (ref 101–111)
Chloride: 103 mmol/L (ref 101–111)
Creatinine, Ser: 1.19 mg/dL — ABNORMAL HIGH (ref 0.44–1.00)
Creatinine, Ser: 0.75 mg/dL (ref 0.44–1.00)
GFR calc Af Amer: 58 mL/min — ABNORMAL LOW (ref >60)
GFR calc Af Amer: >60 mL/min (ref >60)
GFR calc non Af Amer: 50 mL/min — ABNORMAL LOW (ref >60)
GFR calc non Af Amer: >60 mL/min (ref >60)
Glucose, Bld: 129 mg/dL — ABNORMAL HIGH (ref 65–99)
Glucose, Bld: 72 mg/dL (ref 65–99)
Potassium: 3.7 mmol/L (ref 3.5–5.1)
Potassium: 3.1 mmol/L — ABNORMAL LOW (ref 3.5–5.1)
Sodium: 134 mmol/L — ABNORMAL LOW (ref 135–145)
Sodium: 135 mmol/L (ref 135–145)
Total Bilirubin: 2.2 mg/dL — ABNORMAL HIGH (ref 0.3–1.2)
Total Bilirubin: 1.6 mg/dL — ABNORMAL HIGH (ref 0.3–1.2)
Total Protein: 9.6 g/dL — ABNORMAL HIGH (ref 6.5–8.1)
Total Protein: 7.1 g/dL (ref 6.5–8.1)

## 2016-06-22 LAB — CBC WITH DIFFERENTIAL/PLATELET
Basophils Absolute: 0.1 10*3/uL (ref 0.0–0.1)
Basophils Relative: 1 %
Eosinophils Absolute: 0.1 10*3/uL (ref 0.0–0.7)
Eosinophils Relative: 1 %
HCT: 34.6 % — ABNORMAL LOW (ref 36.0–46.0)
Hemoglobin: 11.6 g/dL — ABNORMAL LOW (ref 12.0–15.0)
Lymphocytes Relative: 14 %
Lymphs Abs: 1.6 10*3/uL (ref 0.7–4.0)
MCH: 35 pg — ABNORMAL HIGH (ref 26.0–34.0)
MCHC: 33.5 g/dL (ref 30.0–36.0)
MCV: 104.5 fL — ABNORMAL HIGH (ref 78.0–100.0)
Monocytes Absolute: 1.4 10*3/uL — ABNORMAL HIGH (ref 0.1–1.0)
Monocytes Relative: 12 %
Neutro Abs: 8.5 10*3/uL — ABNORMAL HIGH (ref 1.7–7.7)
Neutrophils Relative %: 72 %
Platelets: 323 10*3/uL (ref 150–400)
RBC: 3.31 MIL/uL — ABNORMAL LOW (ref 3.87–5.11)
RDW: 12.8 % (ref 11.5–15.5)
WBC: 11.6 10*3/uL — ABNORMAL HIGH (ref 4.0–10.5)

## 2016-06-22 LAB — TROPONIN I: Troponin I: <0.03 ng/mL (ref <0.03)

## 2016-06-22 LAB — URINE MICROSCOPIC-ADD ON

## 2016-06-22 LAB — LIPASE, BLOOD: Lipase: 25 U/L (ref 11–51)

## 2016-06-22 LAB — ETHANOL: Alcohol, Ethyl (B): <5 mg/dL (ref <5)

## 2016-06-22 LAB — I-STAT TROPONIN, ED: Troponin i, poc: 0 ng/mL (ref 0.00–0.08)

## 2016-06-22 IMAGING — CR DG CHEST 2V
2 series · 2 of 2 positions shown · non-contrast
Comparison: [DATE]

CLINICAL DATA: Loss of appetite with chest discomfort for 1 day

EXAM:
CHEST  2 VIEW

[w chest pa]
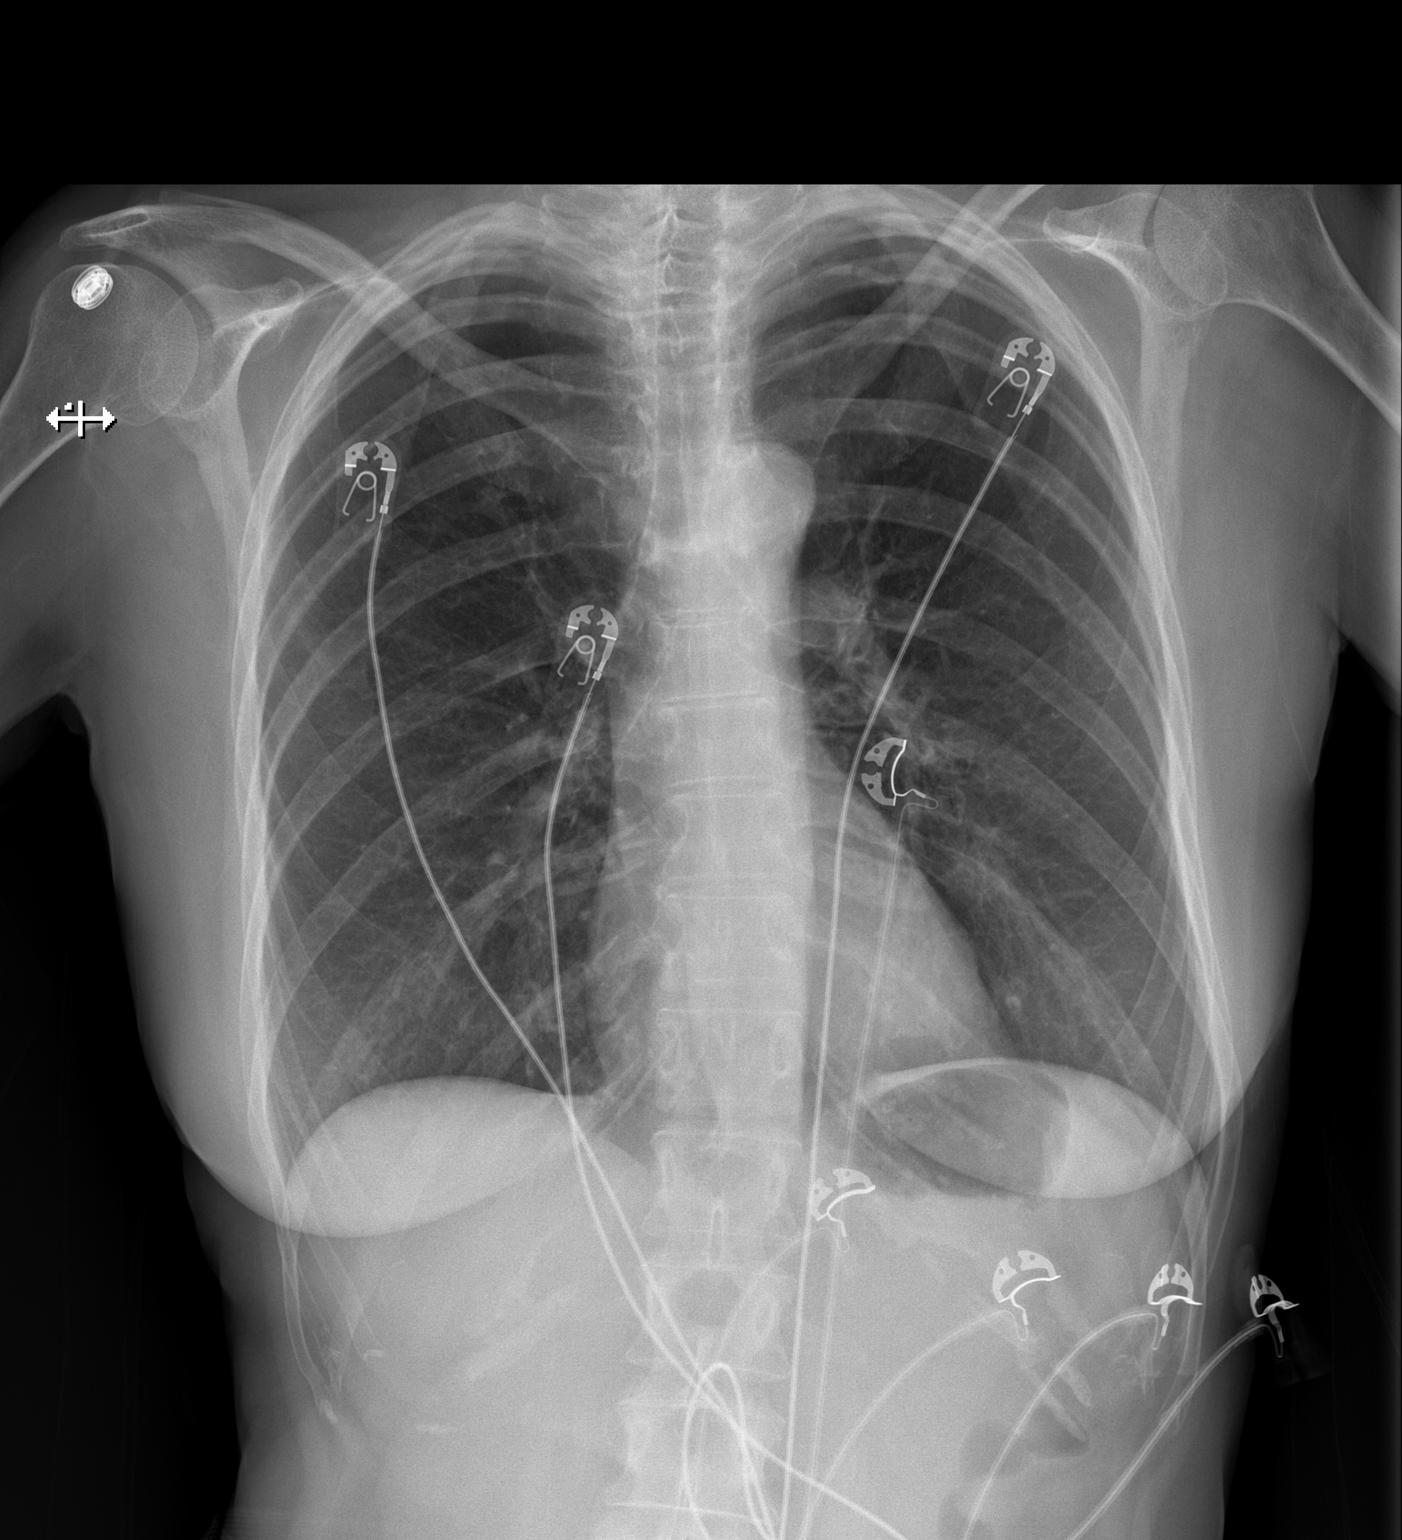

[w chest lat]
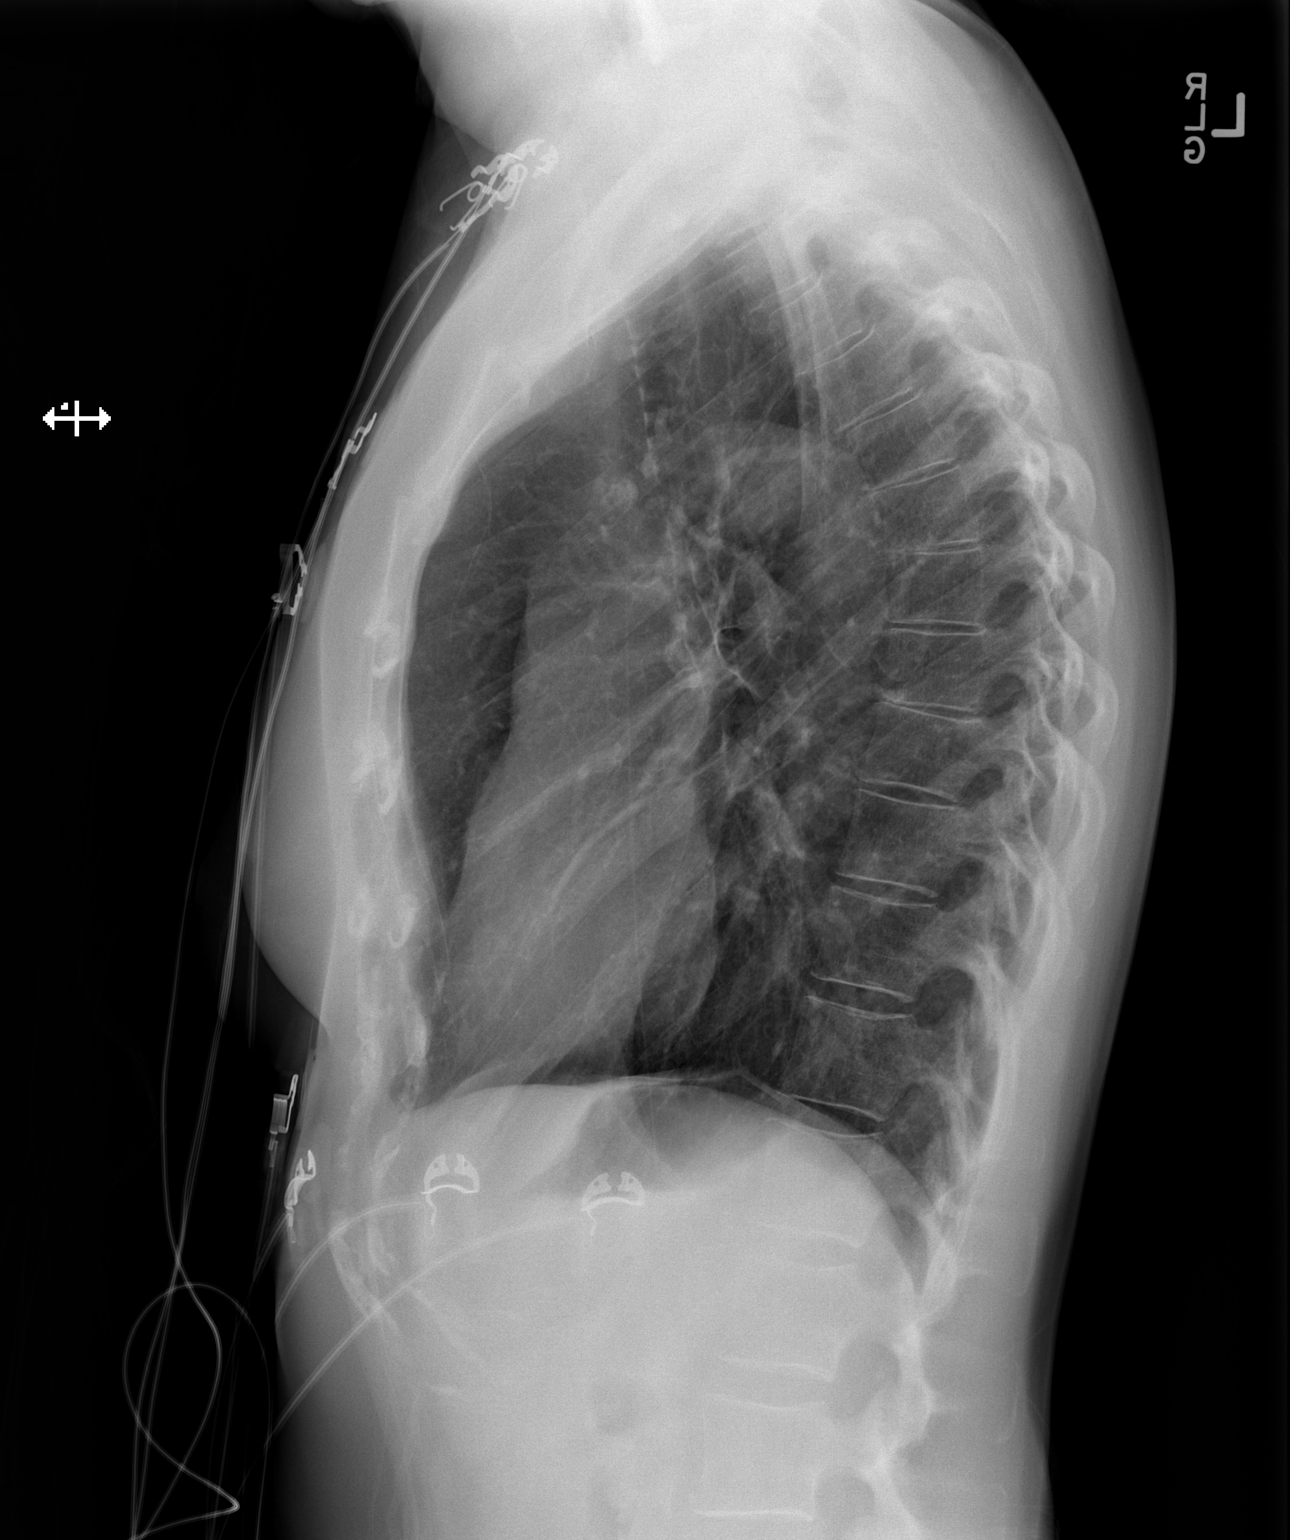

[2 of 2 positions shown; findings below may reference images not displayed]

FINDINGS: There is no edema or consolidation. The heart size and pulmonary
vascularity are normal. There is a small amount of calcification in
the aortic arch. There is also a focus of calcification in the left
carotid artery. There is postoperative change in the cervical spine.
No apparent adenopathy.
IMPRESSION: No edema or consolidation. Small focus of aortic atherosclerosis.
There is also focal calcification in the left carotid artery.

## 2016-06-22 MED ORDER — POTASSIUM CHLORIDE CRYS ER 20 MEQ PO TBCR
40.0000 meq | EXTENDED_RELEASE_TABLET | Freq: Once | ORAL | Status: AC
  Administered 2016-06-22: 40 meq via ORAL
  Filled 2016-06-22: qty 2

## 2016-06-22 MED ORDER — ONDANSETRON 8 MG PO TBDP: 8.0000 mg | ORAL_TABLET | Freq: Three times a day (TID) | ORAL | 0 refills | Status: DC | PRN

## 2016-06-22 MED ORDER — FAMOTIDINE IN NACL 20-0.9 MG/50ML-% IV SOLN
20.0000 mg | Freq: Once | INTRAVENOUS | Status: AC
  Administered 2016-06-22: 20 mg via INTRAVENOUS
  Filled 2016-06-22: qty 50

## 2016-06-22 MED ORDER — RANITIDINE HCL 150 MG PO TABS: 150.0000 mg | ORAL_TABLET | Freq: Two times a day (BID) | ORAL | 0 refills | Status: DC

## 2016-06-22 MED ORDER — SODIUM CHLORIDE 0.9 % IV BOLUS (SEPSIS)
1000.0000 mL | Freq: Once | INTRAVENOUS | Status: AC
  Administered 2016-06-22: 1000 mL via INTRAVENOUS

## 2016-06-22 MED ORDER — ONDANSETRON HCL 4 MG/2ML IJ SOLN
4.0000 mg | Freq: Once | INTRAMUSCULAR | Status: AC
  Administered 2016-06-22: 4 mg via INTRAVENOUS
  Filled 2016-06-22: qty 2

## 2016-06-22 NOTE — ED Notes (Signed)
Attempted blood draw in left hand twice and was unsuccessful, ed phlebotomy at bedside now.

## 2016-06-22 NOTE — ED Provider Notes (Signed)
[facility] DEPT Provider Note   CSN: [id number] Arrival date & time: 06/22/16  7116  First Provider Contact:  None       History   Chief Complaint Chief Complaint  Patient presents with  . Emesis  . Chest Pain    HPI Holly Phelps is a 55 y.o. female with a PMHx of anemia, calcification of aorta, fatty liver, GERD, HTN, hypothyroidism, Hep B, remote C.diff, esophagitis, and a PSHx of appendectomy, who presents to the ED with complaints of decreased appetite 4 days and nausea and vomiting 1.5 days. She states that she has not eaten anything in 4 days. Reports 5-8 daily episodes of nonbloody nonbilious emesis. Last night she began having intermittent "heartburn" which she describes as an 8/10 intermittent sharp and burning centralized chest pain that is nonradiating, lasting approximately 1 minute and then resolving, unchanged with exertion or inspiration, with no known aggravating or alleviating factors given that she has not tried anything prior to arrival. She also reports she has not had a bowel movement in 5 days which she attributes to not having eaten anything. She reports that she had one bourbon and water drink approximately 4 days ago just prior to onset of symptoms. Endorses chronic NSAID use. Positive smoker, denies family or personal history of cardiac disease.  She denies any fevers, chills, lightheadedness, diaphoresis, shortness breath, leg swelling, recent travel/surgery/immobilization, estrogen use, family or personal history of DVT/PE, claudication, orthopnea, abdominal pain, hematemesis, melena, hematochezia, diarrhea, obstipation, dysuria, hematuria, vaginal bleeding or discharge, numbness, tingling, weakness, recent sick contacts, or suspicious food intake.   The history is provided by the patient and medical records. No language interpreter was used.  Emesis   This is a recurrent problem. The current episode started 2 days ago. The problem occurs constantly. The  problem has not changed since onset.Associated symptoms include chest pain ("heartburn") and vomiting. Pertinent negatives include no diarrhea. She has tried nothing for the symptoms. The treatment provided no relief.  Chest Pain  Associated symptoms: chest pain ("heartburn"), nausea and vomiting   Associated symptoms: no blood in stool, no diarrhea, no shortness of breath and no weakness     Past Medical History:  Diagnosis Date  . Allergy   . Anemia   . Calcification of aorta (HCC)   . Fatty liver   . GERD (gastroesophageal reflux disease)   . Headache   . Hypertension   . Hypothyroidism   . 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 Need for hepatitis B vaccination 01/12/2016  . Calcification of aorta (HCC)   . Recurrent Clostridium difficile diarrhea 11/18/2015  . Macrocytic anemia 11/18/2015  . Nausea and vomiting 11/18/2015  . Leucocytosis 11/17/2015  . Protein-calorie malnutrition, severe 11/17/2015  . Hypomagnesemia 09/08/2015  . Hypokalemia 09/07/2015  . Tobacco use disorder 09/07/2015  . Cocaine abuse 05/27/2015  . Anemia 01/29/2013  . Nausea vomiting and diarrhea 01/26/2013  . Esophagitis 01/26/2013  . ANXIETY 03/01/2008  . Alcohol abuse 03/01/2008  . Elevated LFTs 03/01/2008    Past Surgical History:  Procedure Laterality Date  . BACK SURGERY     x 2  . COLONOSCOPY N/A 03/13/2013   Procedure: COLONOSCOPY;  Surgeon: Leighton Ruff, MD;  Location: [facility]COPY;  Service: Endoscopy;  Laterality: N/A;  . LAPAROSCOPIC APPENDECTOMY      OB History    No data available       Home Medications  Prior to Admission medications   Medication Sig Start Date End Date Taking? Authorizing Provider  amoxicillin (AMOXIL) 500 MG capsule 1 tablet twice a day 5 days Patient not taking: Reported on 02/08/2016 02/06/16   [name], MD  Cranberry 500 MG CHEW Chew 1 tablet by mouth daily.     Historical Provider, MD  feeding  supplement (BOOST / RESOURCE BREEZE) LIQD Take 1 Container by mouth 2 (two) times daily between meals. Patient not taking: Reported on 02/06/2016 11/19/15   [name], MD  ferrous gluconate (FERGON) 240 (27 FE) MG tablet Take 240 mg by mouth 2 (two) times daily.     Historical Provider, MD  ibuprofen (ADVIL,MOTRIN) 200 MG tablet Take 200-400 mg by mouth every 6 (six) hours as needed for moderate pain.    Historical Provider, MD  Multiple Vitamin (MULTIVITAMIN WITH MINERALS) TABS Take 1 tablet by mouth daily. 01/29/13   [name], MD  mupirocin cream (BACTROBAN) 2 % Apply topically 3 times a day. 02/06/16   [name], MD  potassium chloride (K-DUR) 10 MEQ tablet Take 1 tablet (10 mEq total) by mouth 3 (three) times daily. Take for as long as you are having loose stools. 11/19/15   [name], MD  thiamine 100 MG tablet Take 1 tablet (100 mg total) by mouth daily. Patient not taking: Reported on 02/06/2016 04/27/15   [name], DO    Family History Family History  Problem Relation Age of Onset  . Stroke Father   . Stroke Brother   . Stroke Brother     Social History Social History  Substance Use Topics  . Smoking status: Current Every Day Smoker    Packs/day: 0.20    Types: Cigarettes  . Smokeless tobacco: Never Used     Comment: 3 cigarettes per day  . Alcohol use 6.0 oz/week    10 Glasses of wine per week     Comment: working to reduce this due to illness     Allergies   Codeine; Flexeril [cyclobenzaprine]; Sulfa antibiotics; and Synthroid [levothyroxine]   Review of Systems Review of Systems  Constitutional: Positive for appetite change. Negative for chills, diaphoresis and fever.  Respiratory: Negative for shortness of breath.   Cardiovascular: Positive for chest pain ("heartburn"). Negative for leg swelling.  Gastrointestinal: Positive for constipation (no BM in 5 days but states it's due to not eating), nausea and vomiting. Negative for abdominal  pain, anal bleeding, blood in stool and diarrhea.  Genitourinary: Negative for dysuria, hematuria, vaginal bleeding and vaginal discharge.  Musculoskeletal: Negative for arthralgias and myalgias.  Skin: Negative for color change.  Allergic/Immunologic: Positive for immunocompromised state (Hep B).  Neurological: Negative for weakness, light-headedness and numbness.  Psychiatric/Behavioral: Negative for confusion.   10 Systems reviewed and are negative for acute change except as noted in the HPI.   Physical Exam Updated Vital Signs BP (!) 157/109   Pulse 118   Temp 97.5 F (36.4 C) (Oral)   Resp 12   SpO2 100%   Physical Exam  Constitutional: She is oriented to person, place, and time. Vital signs are normal. She appears well-developed and well-nourished.  Non-toxic appearance. No distress.  Afebrile, nontoxic, NAD, thin and frail, appears older than stated age  HENT:  Head: Normocephalic and atraumatic.  Mouth/Throat: Oropharynx is clear and moist. Mucous membranes are dry.  Very dry mucous membranes  Eyes: Conjunctivae and EOM are normal. Right eye exhibits no discharge. Left eye exhibits no  discharge.  Neck: Normal range of motion. Neck supple.  Cardiovascular: Regular rhythm, normal heart sounds and intact distal pulses.  Tachycardia present.  Exam reveals no gallop and no friction rub.   No murmur heard. Tachycardic in the low 100s, HR 106-108 during exam, nl s1/s2, no m/r/g, distal pulses intact, no pedal edema   Pulmonary/Chest: Effort normal and breath sounds normal. No respiratory distress. She has no decreased breath sounds. She has no wheezes. She has no rhonchi. She has no rales.  Abdominal: Soft. Normal appearance and bowel sounds are normal. She exhibits no distension. There is tenderness in the right upper quadrant and epigastric area. There is positive Murphy's sign. There is no rigidity, no rebound, no guarding, no CVA tenderness and no tenderness at McBurney's point.      Soft, nondistended, +BS throughout, with moderate epigastric and RUQ TTP, no r/g/r, +murphy's, neg mcburney's, no CVA TTP   Musculoskeletal: Normal range of motion.  Neurological: She is alert and oriented to person, place, and time. She has normal strength. No sensory deficit.  Skin: Skin is warm, dry and intact. No rash noted.  Psychiatric: She has a normal mood and affect.  Nursing note and vitals reviewed.    ED Treatments / Results  Labs (all labs ordered are listed, but only abnormal results are displayed) Labs Reviewed  COMPREHENSIVE METABOLIC PANEL - Abnormal; Notable for the following:       Result Value   Sodium 134 (*)    Chloride 93 (*)    CO2 13 (*)    Glucose, Bld 129 (*)    Creatinine, Ser 1.19 (*)    Total Protein 9.6 (*)    Albumin 5.6 (*)    AST 283 (*)    ALT 125 (*)    Alkaline Phosphatase 238 (*)    Total Bilirubin 2.2 (*)    GFR calc non Af Amer 50 (*)    GFR calc Af Amer 58 (*)    Anion gap 28 (*)    All other components within normal limits  URINALYSIS, ROUTINE W REFLEX MICROSCOPIC (NOT AT [facility]) - Abnormal; Notable for the following:    Bilirubin Urine MODERATE (*)    Ketones, ur >80 (*)    Protein, ur 30 (*)    All other components within normal limits  CBC WITH DIFFERENTIAL/PLATELET - Abnormal; Notable for the following:    WBC 11.6 (*)    RBC 3.31 (*)    Hemoglobin 11.6 (*)    HCT 34.6 (*)    MCV 104.5 (*)    MCH 35.0 (*)    Neutro Abs 8.5 (*)    Monocytes Absolute 1.4 (*)    All other components within normal limits  URINE MICROSCOPIC-ADD ON - Abnormal; Notable for the following:    Squamous Epithelial / LPF 0-5 (*)    Bacteria, UA RARE (*)    Casts HYALINE CASTS (*)    All other components within normal limits  COMPREHENSIVE METABOLIC PANEL - Abnormal; Notable for the following:    Potassium 3.1 (*)    CO2 13 (*)    Calcium 7.6 (*)    AST 199 (*)    ALT 95 (*)    Alkaline Phosphatase 174 (*)    Total Bilirubin 1.6 (*)     Anion gap 19 (*)    All other components within normal limits  URINE CULTURE  LIPASE, BLOOD  ETHANOL  TROPONIN I  I-STAT TROPOININ, ED    EKG  EKG Interpretation  Date/Time:  Friday June 22 2016 09:35:23 EDT Ventricular Rate:  108 PR Interval:    QRS Duration: 82 QT Interval:  326 QTC Calculation: 437 R Axis:   4 Text Interpretation:  Sinus tachycardia Nonspecific T abnormalities, lateral leads ? rate related changes Otherwise no significant change Confirmed by FLOYD MD, DANIEL [phone]) on 06/22/2016 10:18:33 AM       Radiology Dg Chest 2 View  Result Date: 06/22/2016 CLINICAL DATA:  Loss of appetite with chest discomfort for 1 day EXAM: CHEST  2 VIEW COMPARISON:  November 16, 2015 FINDINGS: There is no edema or consolidation. The heart size and pulmonary vascularity are normal. There is a small amount of calcification in the aortic arch. There is also a focus of calcification in the left carotid artery. There is postoperative change in the cervical spine. No apparent adenopathy. IMPRESSION: No edema or consolidation. Small focus of aortic atherosclerosis. There is also focal calcification in the left carotid artery. Electronically Signed   By: Lowella Grip III M.D.   On: 06/22/2016 09:32   US Abdomen Complete  Result Date: 06/22/2016 CLINICAL DATA:  Right upper quadrant pain, initial encounter EXAM: ABDOMEN ULTRASOUND COMPLETE COMPARISON:  11/17/2015 FINDINGS: Gallbladder: No gallstones or wall thickening visualized. No sonographic Murphy sign noted by sonographer. Common bile duct: Diameter: 4 mm Liver: Increased in echogenicity consistent with fatty infiltration. IVC: No abnormality visualized. Pancreas: Visualized portion unremarkable. Spleen: Size and appearance within normal limits. Right Kidney: Length: 5.8 cm. Echogenicity within normal limits. No mass or hydronephrosis visualized. Left Kidney: Length: 10.4 cm. Echogenicity within normal limits. No mass or hydronephrosis  visualized. Abdominal aorta: Atherosclerotic changes are noted without aneurysmal dilatation. Other findings: None. IMPRESSION: Fatty liver. No acute abnormality noted. Electronically Signed   By: Inez Catalina M.D.   On: 06/22/2016 10:43    Procedures Procedures (including critical care time)  Medications Ordered in ED Medications  potassium chloride SA (K-DUR,KLOR-CON) CR tablet 40 mEq (not administered)  sodium chloride 0.9 % bolus 1,000 mL (0 mLs Intravenous Stopped 06/22/16 1240)  ondansetron (ZOFRAN) injection 4 mg (4 mg Intravenous Given 06/22/16 0946)  famotidine (PEPCID) IVPB 20 mg premix (0 mg Intravenous Stopped 06/22/16 1057)  sodium chloride 0.9 % bolus 1,000 mL (1,000 mLs Intravenous New Bag/Given 06/22/16 1245)     Initial Impression / Assessment and Plan / ED Course  I have reviewed the triage vital signs and the nursing notes.  Pertinent labs & imaging results that were available during my care of the patient were reviewed by me and considered in my medical decision making (see chart for details).  Clinical Course    55 y.o. female here with n/v x1.5 days, "heartburn" x1 day, and diminished appetite x4 days. On exam, tachycardic in the low 100s, very dry mucous membranes, tenderness in the epigastrum/RUQ with +murphy's exam, no LE swelling, no hypoxia. No complaints of SOB. Doubt PE. EKG poor quality, difficult to assess whether there are any ischemic findings, will repeat EKG for better quality. CXR with calcification in aorta and L carotid, no other acute findings. Trop neg. CMP and lipase in process, U/A not yet collected. CBC w/diff with WBC 11.6 similar to prior but no changes on differential that would be concerning, likely related to dehydration. CBC also shows anemia, but not to similar extent as prior, which is likely related to hemoconcentration. Will await CMP, lipase, and U/A, and obtain abd U/S. Pt declines pain meds. Will give fluids, pepcid, and nausea  meds then  reassess shortly. May consider GI cocktail if nausea controlled later.  12:35 PM  Repeat EKG without acute ischemic findings. Lipase WNL. CMP with Na 134 similar to prior, Cl 93 similar to prior, Gluc 129, CO2 13 (similar to all prior), Cr 1.19 consistent with AKI/dehydration, AST 283/ALT 125 elevated compared to prior, Alk Phos 238 similar to prior, Bili 2.2 which is much higher than prior results. Anion gap 28 (similar to 10/2015 admission). Overall, labs seem like she's just markedly dehydrated. U/A not yet done, awaiting this to be done. Added on EtOH level which was undetectable. U/S with Fatty liver again noted, but no other findings. It's been 3hrs since initial troponin so will delta trop now. Pt feeling much better, nausea completely resolved, no ongoing heartburn feeling, will PO challenge now. Fluids helped improve HR, now in the 90s. Will give second bolus of fluids and recheck labs to see if she's improved and cleared her anion gap, will reassess after this. Pt declined GI cocktail given that her symptoms have improved/resolved. Will check after second bolus and repeat trop.   2:05 PM  U/A with ketones and protein likely from dehydration, neg nitrites/leuks, 0-5 squamous and 6-30 WBC but rare bacteria, doubt UTI, likely just contamination vs dehydration related findings, will send for culture but doubt need for empiric UTI tx especially given lack of UTI symptoms. Second trop neg. Tolerating PO well, still having no ongoing symptoms. Second bolus done, will repeat CMP now and then reassess shortly. Discussed case with my attending Dr. Tyrone Nine who agrees with plan.   3:29 PM Repeat CMP much better, Na WNL, K 3.1 so will give kdur prior to discharge, CO2 13 but this is consistent with her prior values, BUN and Cr WNL, AST/ALT 199/95, Alk phos 174, Bili down to 1.6 now. Anion gap still marginally high at 19, but improved significantly from prior, she can likely just stay hydrated with PO fluids and  resolve this at home. Overall, symptoms improved, tolerating PO well, feels ready to go home. Symptoms likely related to gastritis/GERD. Zantac and zofran rx given. Tums/Maalox PRN for additional symptom relief. F/up with her GI/PCP in 1wk for recheck. Encouraged pt to stay very well hydrated. Smoking and alcohol cessation discussed. I explained the diagnosis and have given explicit precautions to return to the ER including for any other new or worsening symptoms. The patient understands and accepts the medical plan as it's been dictated and I have answered their questions. Discharge instructions concerning home care and prescriptions have been given. The patient is STABLE and is discharged to home in good condition.    Final Clinical Impressions(s) / ED Diagnoses   Final diagnoses:  Nausea and vomiting in adult  Leukocytosis  Chronic anemia  Dehydration  AKI (acute kidney injury) ([place])  Elevated LFTs  Hyperbilirubinemia  Alcohol use (HCC)  Gastroesophageal reflux disease, esophagitis presence not specified  Tobacco use  Gastritis  High anion gap metabolic acidosis  Hypokalemia    New Prescriptions New Prescriptions   ONDANSETRON (ZOFRAN ODT) 8 MG DISINTEGRATING TABLET    Take 1 tablet (8 mg total) by mouth every 8 (eight) hours as needed for nausea or vomiting.   RANITIDINE (ZANTAC) 150 MG TABLET    Take 1 tablet (150 mg total) by mouth 2 (two) times daily.     Shayanne Gomm Camprubi-Soms, PA-C 06/22/16 1538    Annemarie Sebree Camprubi-Soms, PA-C 06/22/16 [place], DO 06/22/16 1541

## 2016-06-22 NOTE — ED Notes (Signed)
Pt reports that she cannot produce urine sample because she hasn't "peed in days."

## 2016-06-22 NOTE — ED Notes (Signed)
Pt husband: [phone]

## 2016-06-22 NOTE — ED Triage Notes (Signed)
Pt reports emesis for the past 1.5 days with decreased appetite for the past 4 days. Also having "heartburn" since last night. Denies SOB or dizziness. No diarrhea or abd pain.

## 2016-06-22 NOTE — ED Notes (Signed)
PA at bedside.

## 2016-06-22 NOTE — ED Notes (Signed)
Unable to blood from line. Phlebotomy notified

## 2016-06-22 NOTE — ED Notes (Signed)
Patient aware that a urine sample is needed. She is unable to provide at this time.

## 2016-06-22 NOTE — Discharge Instructions (Signed)
Your abdominal pain is likely from gastritis or an ulcer, which could be related to your alcohol consumption. STOP DRINKING ALCOHOL. You will need to take zantac as directed, and avoid spicy/fatty/acidic foods, avoid soda/coffee/tea/alcohol. Avoid laying down flat within 30 minutes of eating. Avoid NSAIDs like ibuprofen/aleve/motrin/etc on an empty stomach. May consider using over the counter tums/maalox as needed for additional relief. Use zofran as directed as needed for nausea. Use tylenol as needed for pain. Follow up with the gastroenterologist and your regular doctor in 5-7 days for ongoing evaluation of your symptoms and to ensure resolution of your dehydration. Return to the ER for changes or worsening symptoms.  You should also stop smoking!  Abdominal (belly) pain can be caused by many things. Your caregiver performed an examination and possibly ordered blood/urine tests and imaging (CT scan, x-rays, ultrasound). Many cases can be observed and treated at home after initial evaluation in the emergency department. Even though you are being discharged home, abdominal pain can be unpredictable. Therefore, you need a repeated exam if your pain does not resolve, returns, or worsens. Most patients with abdominal pain don't have to be admitted to the hospital or have surgery, but serious problems like appendicitis and gallbladder attacks can start out as nonspecific pain. Many abdominal conditions cannot be diagnosed in one visit, so follow-up evaluations are very important. SEEK IMMEDIATE MEDICAL ATTENTION IF YOU DEVELOP ANY OF THE FOLLOWING SYMPTOMS: The pain does not go away or becomes severe.  A temperature above 101 develops.  Repeated vomiting occurs (multiple episodes).  The pain becomes localized to portions of the abdomen. The right side could possibly be appendicitis. In an adult, the left lower portion of the abdomen could be colitis or diverticulitis.  Blood is being passed in stools or vomit  (bright red or black tarry stools).  Return also if you develop chest pain, difficulty breathing, dizziness or fainting, or become confused, poorly responsive, or inconsolable (young children). The constipation stays for more than 4 days.  There is belly (abdominal) or rectal pain.  You do not seem to be getting better.

## 2016-06-24 LAB — URINE CULTURE

## 2016-08-17 DIAGNOSIS — H40053 Ocular hypertension, bilateral: Secondary | ICD-10-CM | POA: Diagnosis not present

## 2016-09-05 ENCOUNTER — Encounter (HOSPITAL_COMMUNITY): Payer: Self-pay | Admitting: Emergency Medicine

## 2016-09-05 ENCOUNTER — Emergency Department (HOSPITAL_COMMUNITY)
Admission: EM | Admit: 2016-09-05 | Discharge: 2016-09-05 | Disposition: A | Discharge status: Home / Self Care | Payer: BLUE CROSS/BLUE SHIELD | Attending: Emergency Medicine | Admitting: Emergency Medicine

## 2016-09-05 ENCOUNTER — Emergency Department (HOSPITAL_COMMUNITY): Payer: BLUE CROSS/BLUE SHIELD

## 2016-09-05 DIAGNOSIS — Z79899 Other long term (current) drug therapy: Secondary | ICD-10-CM | POA: Diagnosis not present

## 2016-09-05 DIAGNOSIS — I10 Essential (primary) hypertension: Secondary | ICD-10-CM | POA: Insufficient documentation

## 2016-09-05 DIAGNOSIS — E039 Hypothyroidism, unspecified: Secondary | ICD-10-CM | POA: Insufficient documentation

## 2016-09-05 DIAGNOSIS — R112 Nausea with vomiting, unspecified: Secondary | ICD-10-CM

## 2016-09-05 DIAGNOSIS — F17210 Nicotine dependence, cigarettes, uncomplicated: Secondary | ICD-10-CM | POA: Insufficient documentation

## 2016-09-05 DIAGNOSIS — R1110 Vomiting, unspecified: Secondary | ICD-10-CM | POA: Diagnosis not present

## 2016-09-05 LAB — URINALYSIS, ROUTINE W REFLEX MICROSCOPIC
Glucose, UA: NEGATIVE mg/dL
Hgb urine dipstick: NEGATIVE
Ketones, ur: >80 mg/dL — AB
Leukocytes, UA: NEGATIVE
Nitrite: NEGATIVE
Protein, ur: NEGATIVE mg/dL
Specific Gravity, Urine: 1.014 (ref 1.005–1.030)
pH: 6 (ref 5.0–8.0)

## 2016-09-05 LAB — COMPREHENSIVE METABOLIC PANEL
ALT: 64 U/L — ABNORMAL HIGH (ref 14–54)
AST: 120 U/L — ABNORMAL HIGH (ref 15–41)
Albumin: 5 g/dL (ref 3.5–5.0)
Alkaline Phosphatase: 149 U/L — ABNORMAL HIGH (ref 38–126)
Anion gap: 25 — ABNORMAL HIGH (ref 5–15)
BUN: 20 mg/dL (ref 6–20)
CO2: 20 mmol/L — ABNORMAL LOW (ref 22–32)
Calcium: 10.6 mg/dL — ABNORMAL HIGH (ref 8.9–10.3)
Chloride: 89 mmol/L — ABNORMAL LOW (ref 101–111)
Creatinine, Ser: 0.99 mg/dL (ref 0.44–1.00)
GFR calc Af Amer: >60 mL/min (ref >60)
GFR calc non Af Amer: >60 mL/min (ref >60)
Glucose, Bld: 101 mg/dL — ABNORMAL HIGH (ref 65–99)
Potassium: 3.3 mmol/L — ABNORMAL LOW (ref 3.5–5.1)
Sodium: 134 mmol/L — ABNORMAL LOW (ref 135–145)
Total Bilirubin: 1.6 mg/dL — ABNORMAL HIGH (ref 0.3–1.2)
Total Protein: 8.8 g/dL — ABNORMAL HIGH (ref 6.5–8.1)

## 2016-09-05 LAB — CBC
HCT: 32.2 % — ABNORMAL LOW (ref 36.0–46.0)
Hemoglobin: 10.6 g/dL — ABNORMAL LOW (ref 12.0–15.0)
MCH: 34.8 pg — ABNORMAL HIGH (ref 26.0–34.0)
MCHC: 32.9 g/dL (ref 30.0–36.0)
MCV: 105.6 fL — ABNORMAL HIGH (ref 78.0–100.0)
Platelets: 323 10*3/uL (ref 150–400)
RBC: 3.05 MIL/uL — ABNORMAL LOW (ref 3.87–5.11)
RDW: 13.3 % (ref 11.5–15.5)
WBC: 9 10*3/uL (ref 4.0–10.5)

## 2016-09-05 LAB — LIPASE, BLOOD: Lipase: 20 U/L (ref 11–51)

## 2016-09-05 LAB — TROPONIN I: Troponin I: <0.03 ng/mL (ref <0.03)

## 2016-09-05 IMAGING — CR DG ABDOMEN ACUTE W/ 1V CHEST
3 series · 3 of 3 positions shown · non-contrast
Comparison: Chest radiographs [DATE] and earlier. CT Abdomen
and Pelvis [DATE]

CLINICAL DATA: 55-year-old female with vomiting for 3 days. Initial
encounter. Smoker.

EXAM:
DG ABDOMEN ACUTE W/ 1V CHEST

[w chest pa]
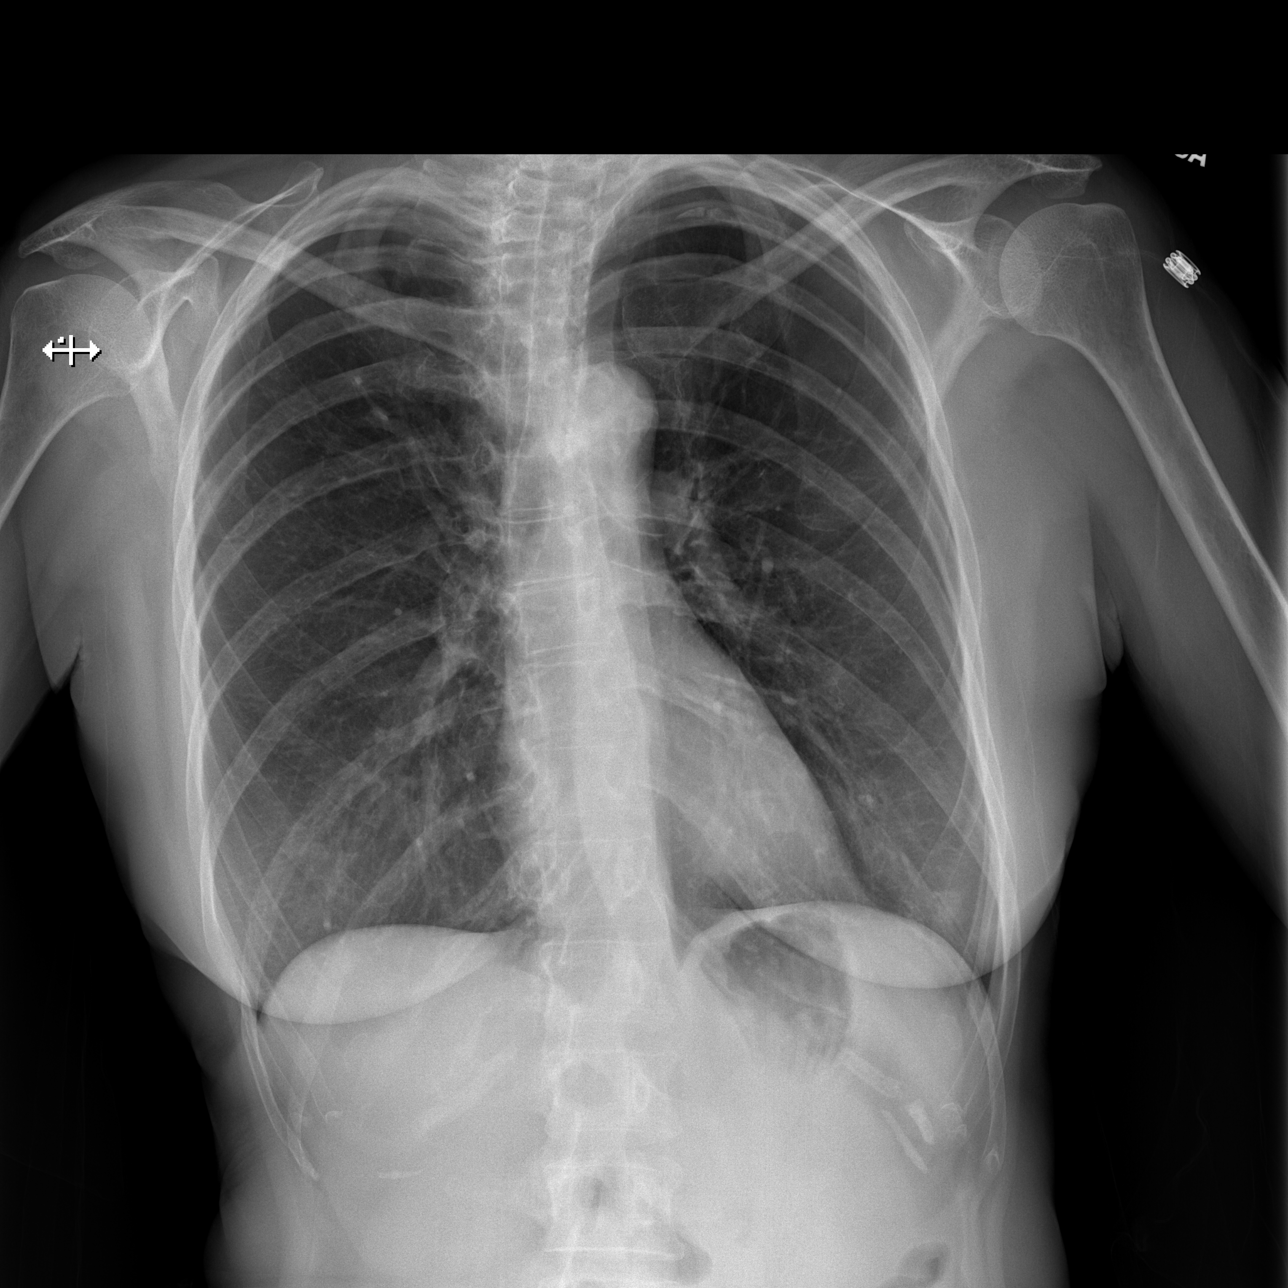

[w abdomen upright]
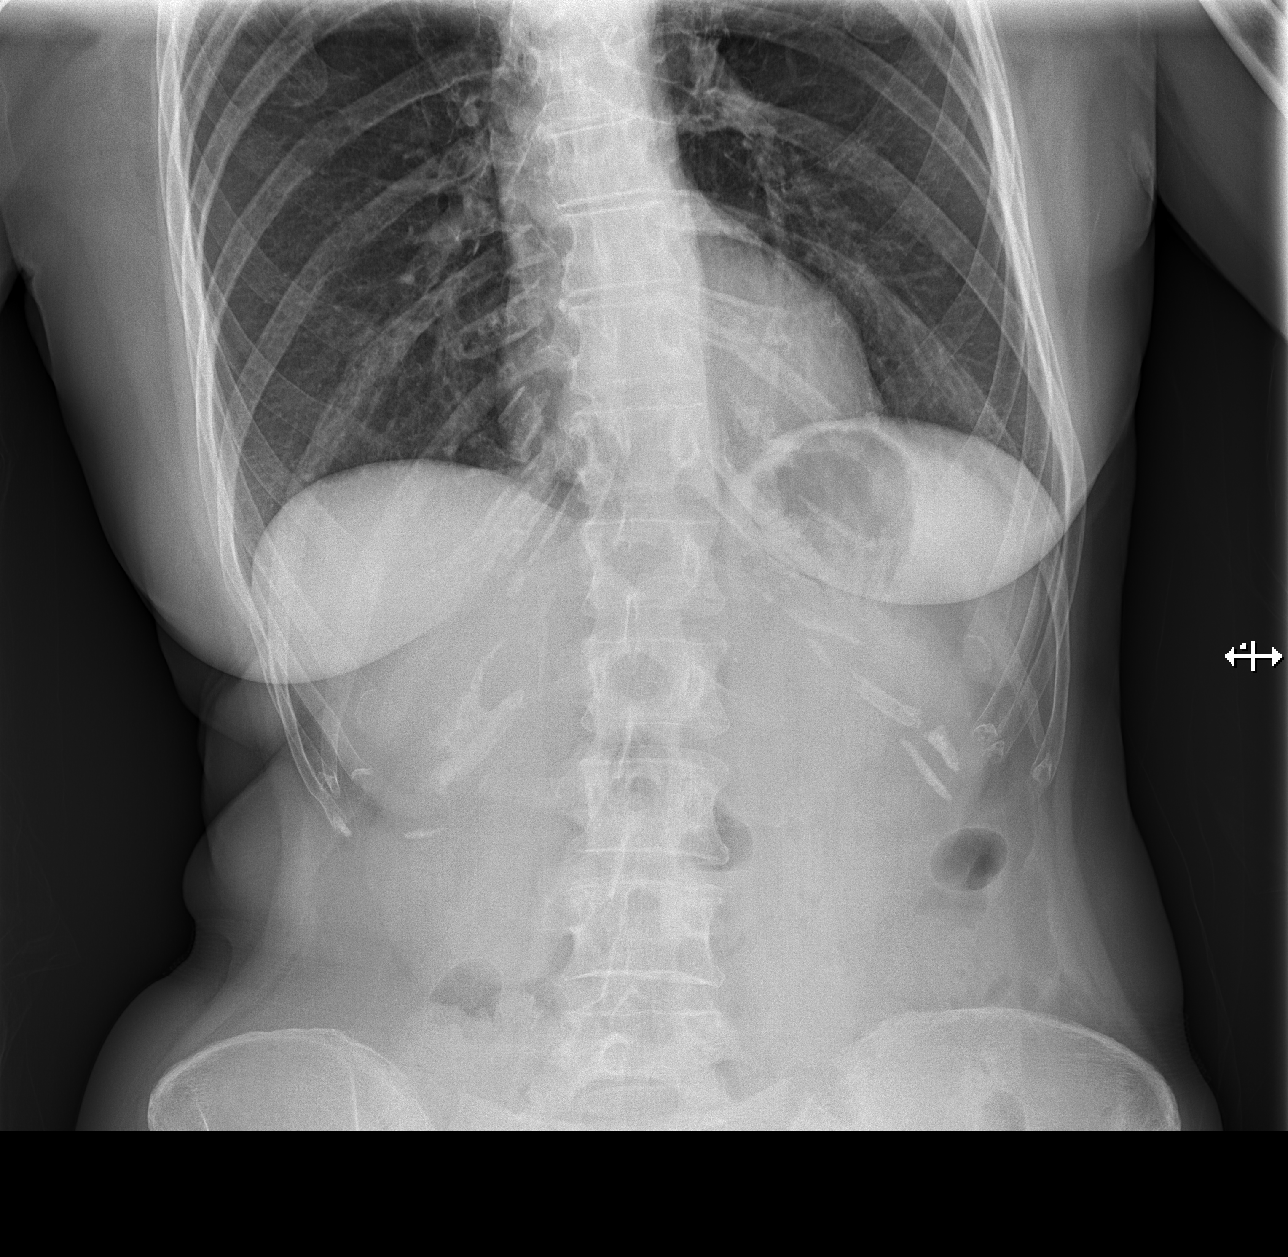

[t abdomen supine]
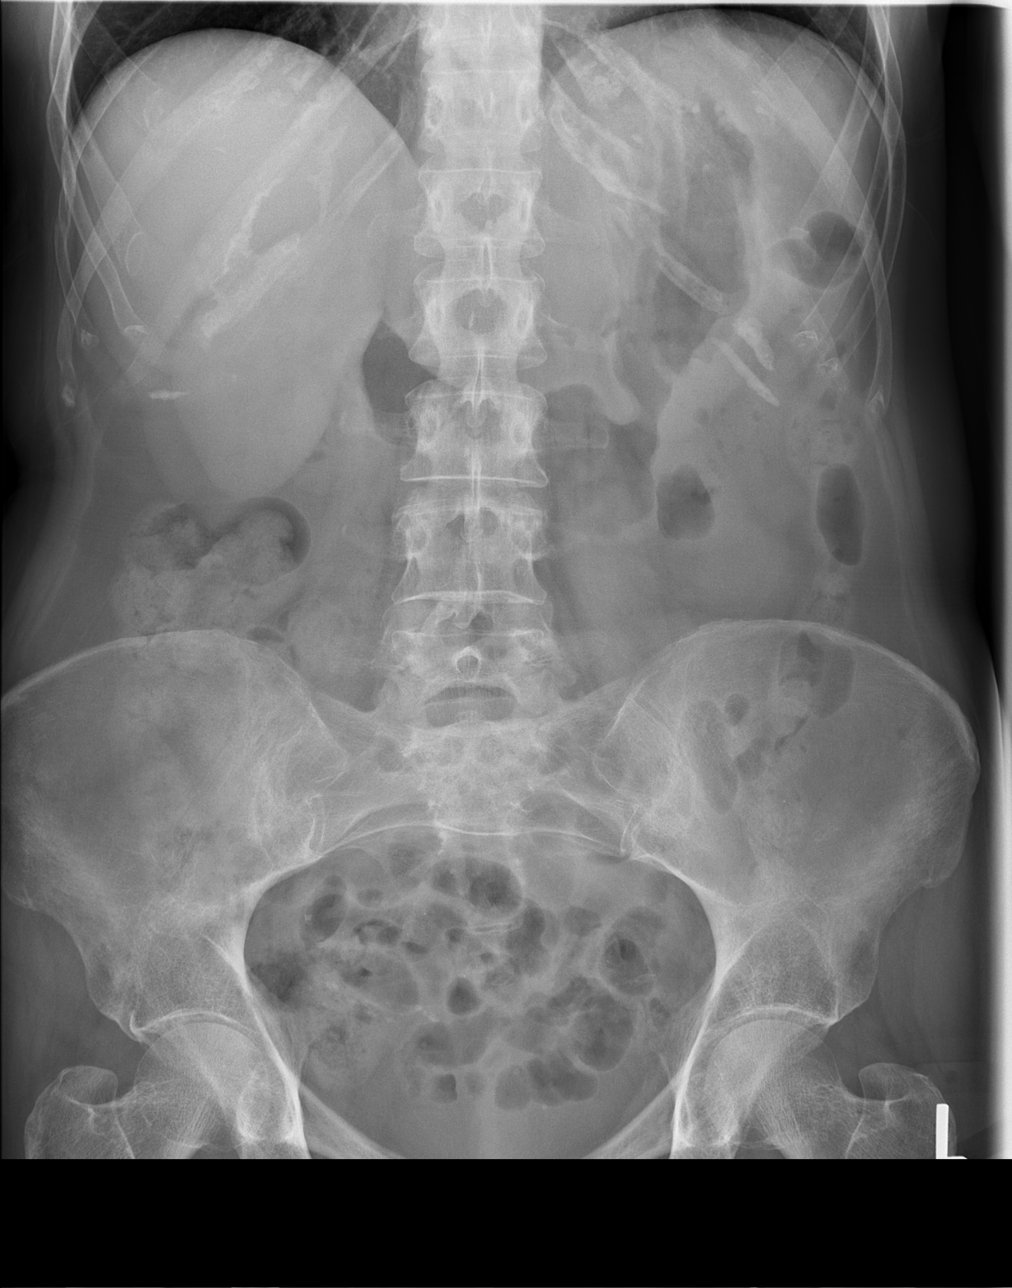

[3 of 3 positions shown; findings below may reference images not displayed]

FINDINGS: Stable lung volumes. Normal cardiac size and mediastinal contours.
No pneumothorax or pneumoperitoneum. Into the left nipple shadow. No
acute pulmonary opacity identified, mild scattered chronic
reticulonodular density.

Non obstructed bowel gas pattern. Stable and normal abdominal and
pelvic visceral contours. No osseous abnormality identified aside
from mild scoliosis. Partially visible cervical ACDF hardware.
IMPRESSION: 1.  Normal bowel gas pattern, no free air.
2.  No acute cardiopulmonary abnormality.

## 2016-09-05 MED ORDER — PROMETHAZINE HCL 25 MG RE SUPP: 25.0000 mg | Freq: Four times a day (QID) | RECTAL | 0 refills | Status: DC | PRN

## 2016-09-05 MED ORDER — FAMOTIDINE IN NACL 20-0.9 MG/50ML-% IV SOLN
20.0000 mg | Freq: Once | INTRAVENOUS | Status: AC
Start: 2016-09-05 — End: 2016-09-05
  Administered 2016-09-05: 20 mg via INTRAVENOUS
  Filled 2016-09-05: qty 50

## 2016-09-05 MED ORDER — PROMETHAZINE HCL 25 MG/ML IJ SOLN
25.0000 mg | Freq: Once | INTRAMUSCULAR | Status: AC
  Administered 2016-09-05: 25 mg via INTRAVENOUS
  Filled 2016-09-05: qty 1

## 2016-09-05 MED ORDER — FAMOTIDINE 20 MG PO TABS: 20.0000 mg | ORAL_TABLET | Freq: Two times a day (BID) | ORAL | 0 refills | Status: DC

## 2016-09-05 MED ORDER — SODIUM CHLORIDE 0.9 % IV BOLUS (SEPSIS)
1000.0000 mL | Freq: Once | INTRAVENOUS | Status: AC
  Administered 2016-09-05: 1000 mL via INTRAVENOUS

## 2016-09-05 MED ORDER — ONDANSETRON HCL 4 MG/2ML IJ SOLN: 4.0000 mg | Freq: Once | INTRAMUSCULAR | Status: DC | PRN

## 2016-09-05 NOTE — ED Triage Notes (Signed)
Per pt, states she has been throwing up for three days-states she vomited 14 times yesterday-no abdominal pain

## 2016-09-05 NOTE — ED Provider Notes (Signed)
[facility] DEPT Provider Note   CSN: [id number] Arrival date & time: 09/05/16  0920     History   Chief Complaint Chief Complaint  Patient presents with  . Emesis    HPI Holly Phelps is a 55 y.o. female.  Pt presents to the ED today with vomiting for 3 days.  The pt denies any pain.  The pt has had this problem in the past and has Zofran 8 mg odt at home which she has been taking, but it has not been helping.  Pt denies any fevers or chest pain.      Past Medical History:  Diagnosis Date  . Allergy   . Anemia   . Calcification of aorta (HCC)   . Fatty liver   . GERD (gastroesophageal reflux disease)   . Headache   . Hypertension   . Hypothyroidism   . 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 Need for hepatitis B vaccination 01/12/2016  . Calcification of aorta (HCC)   . Recurrent Clostridium difficile diarrhea 11/18/2015  . Macrocytic anemia 11/18/2015  . Nausea and vomiting 11/18/2015  . Leucocytosis 11/17/2015  . Protein-calorie malnutrition, severe 11/17/2015  . Hypomagnesemia 09/08/2015  . Hypokalemia 09/07/2015  . Tobacco use disorder 09/07/2015  . Cocaine abuse 05/27/2015  . Anemia 01/29/2013  . Nausea vomiting and diarrhea 01/26/2013  . Esophagitis 01/26/2013  . ANXIETY 03/01/2008  . Alcohol abuse 03/01/2008  . Elevated LFTs 03/01/2008    Past Surgical History:  Procedure Laterality Date  . BACK SURGERY     x 2  . COLONOSCOPY N/A 03/13/2013   Procedure: COLONOSCOPY;  Surgeon: Leighton Ruff, MD;  Location: [facility];  Service: Endoscopy;  Laterality: N/A;  . LAPAROSCOPIC APPENDECTOMY      OB History    No data available       Home Medications    Prior to Admission medications   Medication Sig Start Date End Date Taking? Authorizing Provider  feeding supplement (BOOST / RESOURCE BREEZE) LIQD Take 1 Container by mouth 2 (two) times daily between meals. 11/19/15  Yes [name],  MD  ferrous gluconate (FERGON) 240 (27 FE) MG tablet Take 240 mg by mouth 2 (two) times daily.    Yes Historical Provider, MD  ibuprofen (ADVIL,MOTRIN) 200 MG tablet Take 200-400 mg by mouth every 6 (six) hours as needed for moderate pain.   Yes Historical Provider, MD  Multiple Vitamin (MULTIVITAMIN WITH MINERALS) TABS Take 1 tablet by mouth daily. 01/29/13  Yes [name], MD  ondansetron (ZOFRAN ODT) 8 MG disintegrating tablet Take 1 tablet (8 mg total) by mouth every 8 (eight) hours as needed for nausea or vomiting. 06/22/16  Yes Mercedes Camprubi-Soms, PA-C  thiamine 100 MG tablet Take 1 tablet (100 mg total) by mouth daily. 04/27/15  Yes [name], DO  famotidine (PEPCID) 20 MG tablet Take 1 tablet (20 mg total) by mouth 2 (two) times daily. 09/05/16   Isla Pence, MD  promethazine (PHENERGAN) 25 MG suppository Place 1 suppository (25 mg total) rectally every 6 (six) hours as needed for nausea or vomiting. 09/05/16   Isla Pence, MD  ranitidine (ZANTAC) 150 MG tablet Take 1 tablet (150 mg total) by mouth 2 (two) times daily. Patient not taking: Reported on 09/05/2016 06/22/16   Mercedes Camprubi-Soms, PA-C    Family History Family History  Problem Relation Age of Onset  . Stroke Father   .  Stroke Brother   . Stroke Brother     Social History Social History  Substance Use Topics  . Smoking status: Current Every Day Smoker    Packs/day: 0.20    Types: Cigarettes  . Smokeless tobacco: Never Used     Comment: 3 cigarettes per day  . Alcohol use 6.0 oz/week    10 Glasses of wine per week     Comment: working to reduce this due to illness     Allergies   Codeine; Flexeril [cyclobenzaprine]; Sulfa antibiotics; and Synthroid [levothyroxine]   Review of Systems Review of Systems  Gastrointestinal: Positive for nausea and vomiting.  All other systems reviewed and are negative.    Physical Exam Updated Vital Signs BP 123/76   Pulse 89   Temp 98 F (36.7 C) (Oral)    Resp (!) 28   SpO2 100%   Physical Exam  Constitutional: She is oriented to person, place, and time. She appears well-developed and well-nourished.  HENT:  Head: Normocephalic and atraumatic.  Right Ear: External ear normal.  Left Ear: External ear normal.  Nose: Nose normal.  Mouth/Throat: Mucous membranes are dry.  Eyes: Conjunctivae and EOM are normal. Pupils are equal, round, and reactive to light.  Neck: Normal range of motion. Neck supple.  Cardiovascular: Regular rhythm, normal heart sounds and intact distal pulses.  Tachycardia present.   Pulmonary/Chest: Effort normal and breath sounds normal.  Abdominal: Soft. Bowel sounds are normal. There is tenderness in the epigastric area.  Musculoskeletal: Normal range of motion.  Neurological: She is alert and oriented to person, place, and time.  Skin: Skin is warm.  Psychiatric: She has a normal mood and affect. Her behavior is normal. Judgment and thought content normal.  Nursing note and vitals reviewed.    ED Treatments / Results  Labs (all labs ordered are listed, but only abnormal results are displayed) Labs Reviewed  COMPREHENSIVE METABOLIC PANEL - Abnormal; Notable for the following:       Result Value   Sodium 134 (*)    Potassium 3.3 (*)    Chloride 89 (*)    CO2 20 (*)    Glucose, Bld 101 (*)    Calcium 10.6 (*)    Total Protein 8.8 (*)    AST 120 (*)    ALT 64 (*)    Alkaline Phosphatase 149 (*)    Total Bilirubin 1.6 (*)    Anion gap 25 (*)    All other components within normal limits  CBC - Abnormal; Notable for the following:    RBC 3.05 (*)    Hemoglobin 10.6 (*)    HCT 32.2 (*)    MCV 105.6 (*)    MCH 34.8 (*)    All other components within normal limits  URINALYSIS, ROUTINE W REFLEX MICROSCOPIC (NOT AT [facility]) - Abnormal; Notable for the following:    Bilirubin Urine MODERATE (*)    Ketones, ur >80 (*)    All other components within normal limits  LIPASE, BLOOD  TROPONIN I    EKG  EKG  Interpretation None       Radiology Dg Abdomen Acute W/chest  Result Date: 09/05/2016 CLINICAL DATA:  55 year old female with vomiting for 3 days. Initial encounter. Smoker. EXAM: DG ABDOMEN ACUTE W/ 1V CHEST COMPARISON:  Chest radiographs 06/22/2016 and earlier. CT Abdomen and Pelvis 10/01/2015 FINDINGS: Stable lung volumes. Normal cardiac size and mediastinal contours. No pneumothorax or pneumoperitoneum. Into the left nipple shadow. No acute pulmonary opacity identified,  mild scattered chronic reticulonodular density. Non obstructed bowel gas pattern. Stable and normal abdominal and pelvic visceral contours. No osseous abnormality identified aside from mild scoliosis. Partially visible cervical ACDF hardware. IMPRESSION: 1.  Normal bowel gas pattern, no free air. 2.  No acute cardiopulmonary abnormality. Electronically Signed   By: Genevie Ann M.D.   On: 09/05/2016 10:51    Procedures Procedures (including critical care time)  Medications Ordered in ED Medications  sodium chloride 0.9 % bolus 1,000 mL (0 mLs Intravenous Stopped 09/05/16 1216)  promethazine (PHENERGAN) injection 25 mg (25 mg Intravenous Given 09/05/16 1026)  famotidine (PEPCID) IVPB 20 mg premix (0 mg Intravenous Stopped 09/05/16 1216)     Initial Impression / Assessment and Plan / ED Course  I have reviewed the triage vital signs and the nursing notes.  Pertinent labs & imaging results that were available during my care of the patient were reviewed by me and considered in my medical decision making (see chart for details).  Clinical Course   Pt is feeling much better.  She is able to tolerate po fluids.  She will be d/c'd home on phenergan supp. As the zofran is not helping.  She is also d/c'd with pepcid to try to help control her gerd.  She knows to return if worse.  Final Clinical Impressions(s) / ED Diagnoses   Final diagnoses:  Non-intractable vomiting with nausea, unspecified vomiting type    New  Prescriptions New Prescriptions   FAMOTIDINE (PEPCID) 20 MG TABLET    Take 1 tablet (20 mg total) by mouth 2 (two) times daily.   PROMETHAZINE (PHENERGAN) 25 MG SUPPOSITORY    Place 1 suppository (25 mg total) rectally every 6 (six) hours as needed for nausea or vomiting.     Isla Pence, MD 09/05/16 1320

## 2016-09-05 NOTE — ED Notes (Addendum)
Pt states she does not want an EKG.

## 2016-09-07 DIAGNOSIS — R112 Nausea with vomiting, unspecified: Secondary | ICD-10-CM | POA: Diagnosis not present

## 2016-09-07 DIAGNOSIS — R748 Abnormal levels of other serum enzymes: Secondary | ICD-10-CM | POA: Diagnosis not present

## 2016-09-07 DIAGNOSIS — D539 Nutritional anemia, unspecified: Secondary | ICD-10-CM | POA: Diagnosis not present

## 2016-11-16 DIAGNOSIS — H40053 Ocular hypertension, bilateral: Secondary | ICD-10-CM | POA: Diagnosis not present

## 2016-12-21 ENCOUNTER — Ambulatory Visit (INDEPENDENT_AMBULATORY_CARE_PROVIDER_SITE_OTHER): Payer: BLUE CROSS/BLUE SHIELD | Admitting: Physician Assistant

## 2016-12-21 ENCOUNTER — Ambulatory Visit (INDEPENDENT_AMBULATORY_CARE_PROVIDER_SITE_OTHER): Payer: BLUE CROSS/BLUE SHIELD

## 2016-12-21 VITALS — BP 146/90 | HR 140 | Resp 18 | Wt 102.0 lb

## 2016-12-21 DIAGNOSIS — R079 Chest pain, unspecified: Secondary | ICD-10-CM | POA: Diagnosis not present

## 2016-12-21 DIAGNOSIS — F17200 Nicotine dependence, unspecified, uncomplicated: Secondary | ICD-10-CM | POA: Diagnosis not present

## 2016-12-21 DIAGNOSIS — R000 Tachycardia, unspecified: Secondary | ICD-10-CM | POA: Diagnosis not present

## 2016-12-21 DIAGNOSIS — M940 Chondrocostal junction syndrome [Tietze]: Secondary | ICD-10-CM

## 2016-12-21 DIAGNOSIS — R0789 Other chest pain: Secondary | ICD-10-CM | POA: Diagnosis not present

## 2016-12-21 IMAGING — DX DG CHEST 2V
2 series · 2 of 2 positions shown · non-contrast
Comparison: [DATE]

CLINICAL DATA: Chest and right rib pain

EXAM:
CHEST  2 VIEW

[chest pa]
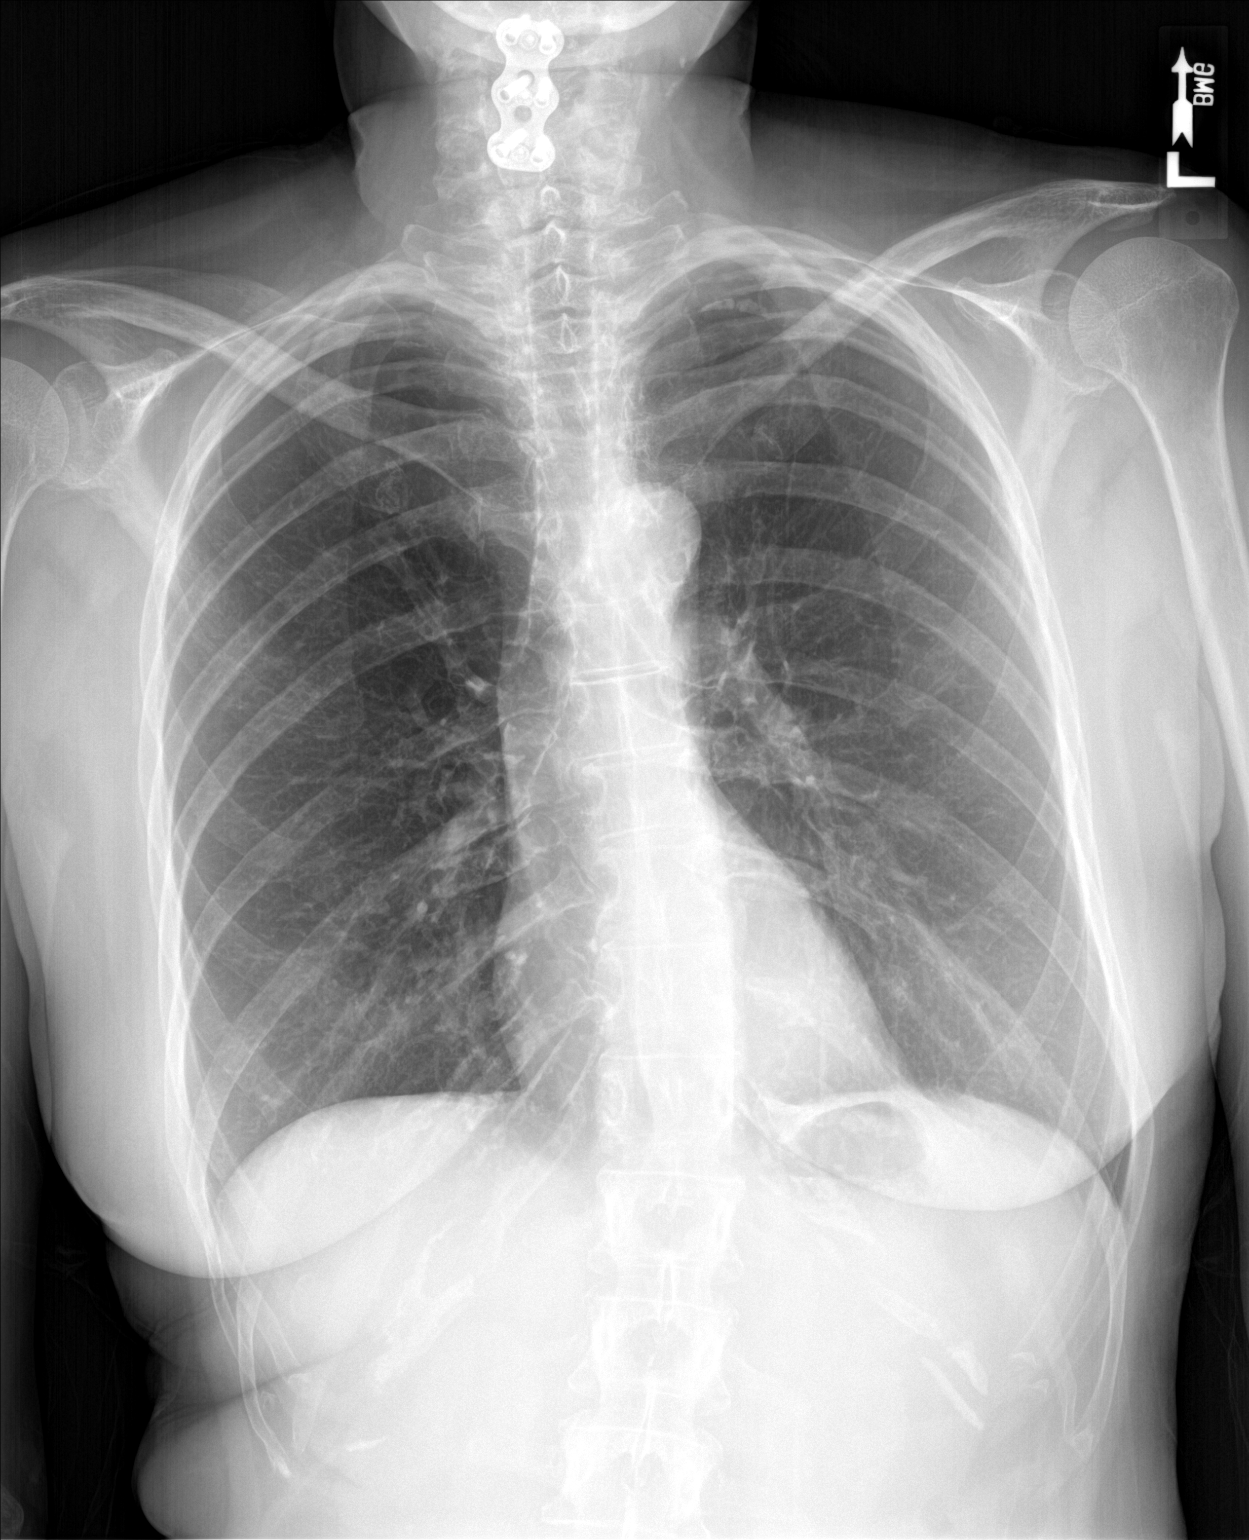

[chest lat]
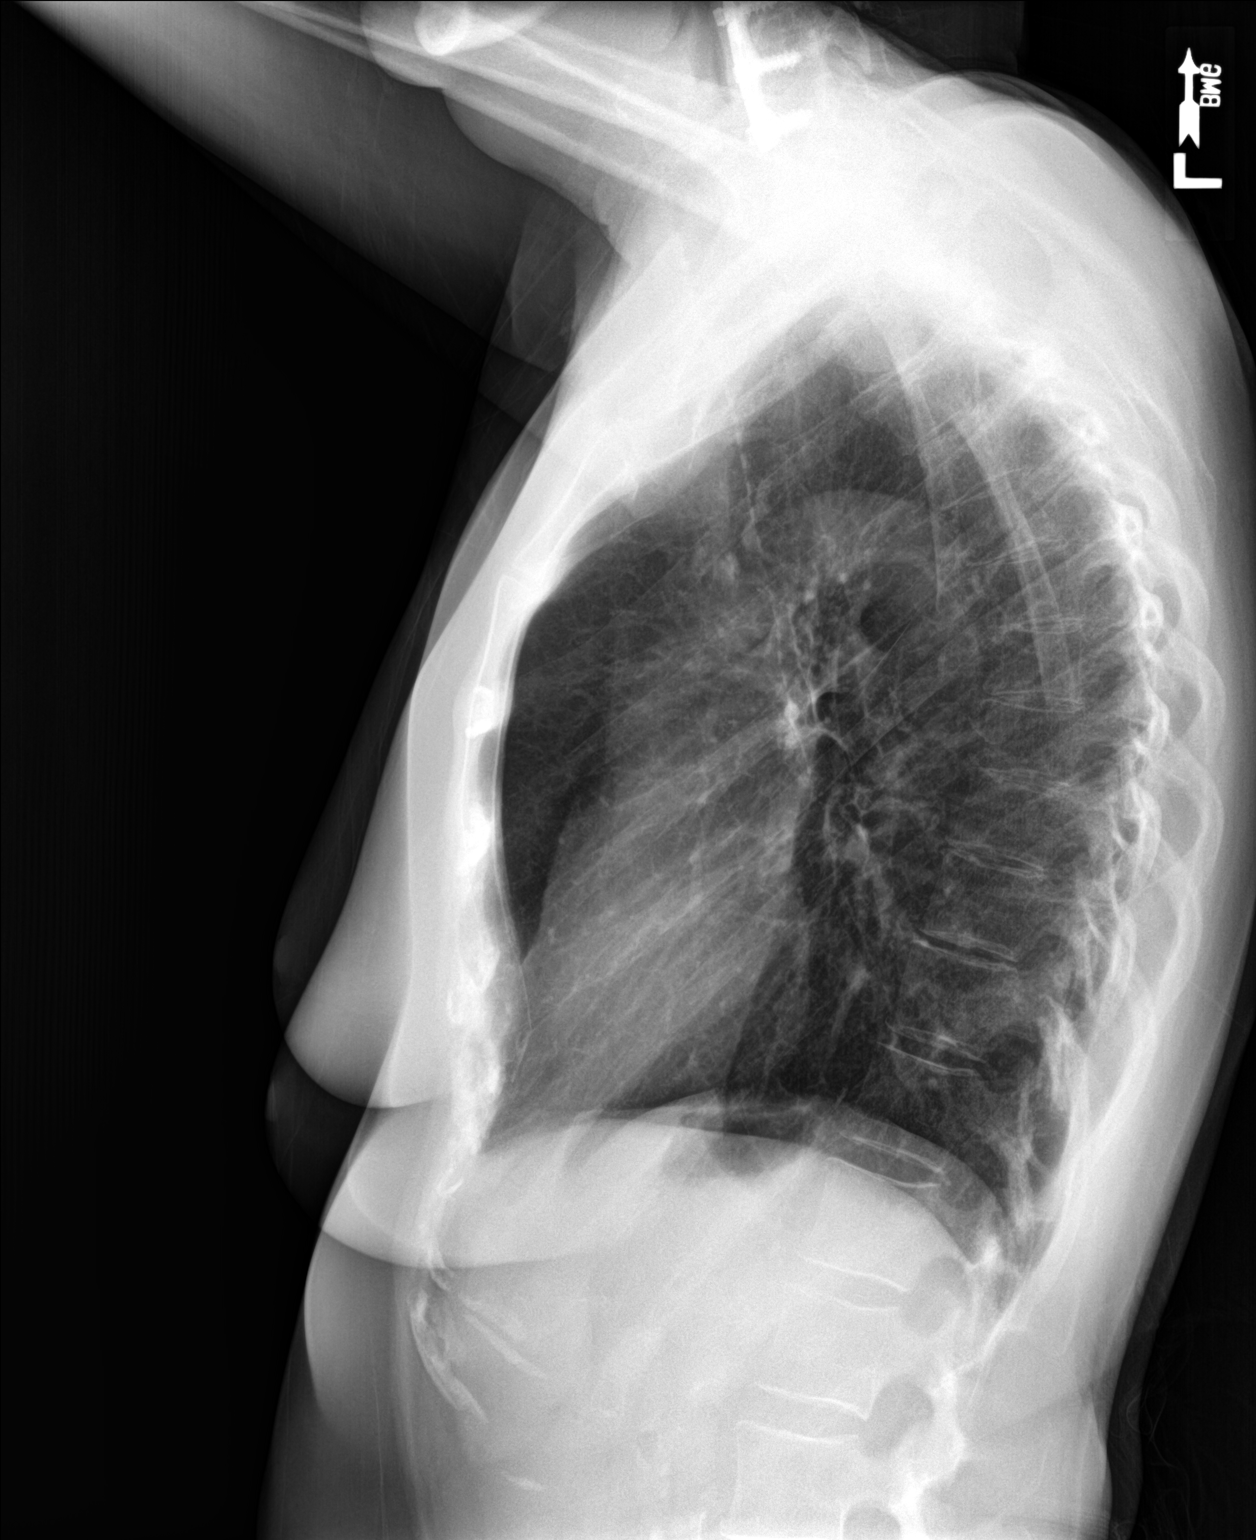

[2 of 2 positions shown; findings below may reference images not displayed]

FINDINGS: Normal heart size and mediastinal contours. No acute infiltrate or
edema. No effusion or pneumothorax. Remote lateral right ninth rib
fracture. No acute osseous findings. Mild scoliosis. Status post
ACDF.
IMPRESSION: No active cardiopulmonary disease.

## 2016-12-21 MED ORDER — NAPROXEN 500 MG PO TABS: 500.0000 mg | ORAL_TABLET | Freq: Two times a day (BID) | ORAL | 0 refills | Status: DC

## 2016-12-21 NOTE — Patient Instructions (Addendum)
You have declined any blood-work today and have voiced understanding that this may result in a missed diagnosis and potentially serious outcomes. Please follow-up with your primary care provider for routine care.   I believe the chest/rib pain can be attributed to a condition called costochondritis which isn't inflammation of the cartilage in the rib cage. Please see below for further information. The way we treat this is with anti-inflammatories and time. If you experience any new or concerning symptoms, if anything gets worse or changes, please let us know right away or seek care in the emergency department.   Costochondritis Costochondritis is swelling and irritation (inflammation) of the tissue (cartilage) that connects your ribs to your breastbone (sternum). This causes pain in the front of your chest. The pain usually starts gradually and involves more than one rib. What are the causes? The exact cause of this condition is not always known. It results from stress on the cartilage where your ribs attach to your sternum. The cause of this stress could be:  Chest injury (trauma).  Exercise or activity, such as lifting.  Severe coughing. What increases the risk? You may be at higher risk for this condition if you:  Are female.  Are 63?56 years old.  Recently started a new exercise or work activity.  Have low levels of vitamin D.  Have a condition that makes you cough frequently. What are the signs or symptoms? The main symptom of this condition is chest pain. The pain:  Usually starts gradually and can be sharp or dull.  Gets worse with deep breathing, coughing, or exercise.  Gets better with rest.  May be worse when you press on the sternum-rib connection (tenderness). How is this diagnosed? This condition is diagnosed based on your symptoms, medical history, and a physical exam. Your health care provider will check for tenderness when pressing on your sternum. This is the  most important finding. You may also have tests to rule out other causes of chest pain. These may include:  A chest X-ray to check for lung problems.  An electrocardiogram (ECG) to see if you have a heart problem that could be causing the pain.  An imaging scan to rule out a chest or rib fracture. How is this treated? This condition usually goes away on its own over time. Your health care provider may prescribe an NSAID to reduce pain and inflammation. Your health care provider may also suggest that you:  Rest and avoid activities that make pain worse.  Apply heat or cold to the area to reduce pain and inflammation.  Do exercises to stretch your chest muscles. If these treatments do not help, your health care provider may inject a numbing medicine at the sternum-rib connection to help relieve the pain. Follow these instructions at home:  Avoid activities that make pain worse. This includes any activities that use chest, abdominal, and side muscles.  If directed, put ice on the painful area:  Put ice in a plastic bag.  Place a towel between your skin and the bag.  Leave the ice on for 20 minutes, 2-3 times a day.  If directed, apply heat to the affected area as often as told by your health care provider. Use the heat source that your health care provider recommends, such as a moist heat pack or a heating pad.  Place a towel between your skin and the heat source.  Leave the heat on for 20-30 minutes.  Remove the heat if your skin turns  bright red. This is especially important if you are unable to feel pain, heat, or cold. You may have a greater risk of getting burned.  Take over-the-counter and prescription medicines only as told by your health care provider.  Return to your normal activities as told by your health care provider. Ask your health care provider what activities are safe for you.  Keep all follow-up visits as told by your health care provider. This is  important. Contact a health care provider if:  You have chills or a fever.  Your pain does not go away or it gets worse.  You have a cough that does not go away (is persistent). Get help right away if:  You have shortness of breath. This information is not intended to replace advice given to you by your health care provider. Make sure you discuss any questions you have with your health care provider. Document Released: 08/15/2005 Document Revised: 05/25/2016 Document Reviewed: 02/29/2016 Elsevier Interactive Patient Education  2017 [employer].    IF you received an x-ray today, you will receive an invoice from [facility]. Please contact [facility] at [phone] with questions or concerns regarding your invoice.   IF you received labwork today, you will receive an invoice from [facility]. Please contact [facility] at [phone] with questions or concerns regarding your invoice.   Our billing staff will not be able to assist you with questions regarding bills from these companies.  You will be contacted with the lab results as soon as they are available. The fastest way to get your results is to activate your My Chart account. Instructions are located on the last page of this paperwork. If you have not heard from [place] regarding the results in 2 weeks, please contact this office.

## 2016-12-21 NOTE — Progress Notes (Signed)
HPI: Holly Phelps is a 56 y.o. female  who presents to Primary Care at [facility] today, 12/21/16,  for chief complaint of:  Chief Complaint  Patient presents with  . Breast Pain    right pain x 1 week and a lump /tachy    Pain . Context: no injury, no recent illness . Location: under R breast . Quality: tender . Severity: . Duration: 1 week . Timing: "random," deep breaths make no difference . Modifying factors:  . Assoc signs/symptoms:   Tachycardia . Quality: noted on VS, pt has not complained of this . Severity: 140s-110s . Duration: unknown . Context: pt insists it is just due to anxiety, she does not like coming to doctors offices but was concerned about the rib/chest pain . Assoc signs/symptoms: no dizziness     Past medical, surgical, social and family history reviewed: Past Medical History:  Diagnosis Date  . Allergy   . Anemia   . Calcification of aorta (HCC)   . Fatty liver   . GERD (gastroesophageal reflux disease)   . Headache   . Hypertension   . Hypothyroidism   . Tuberculosis    as baby   Past Surgical History:  Procedure Laterality Date  . BACK SURGERY     x 2  . COLONOSCOPY N/A 03/13/2013   Procedure: COLONOSCOPY;  Surgeon: Leighton Ruff, MD;  Location: [facility];  Service: Endoscopy;  Laterality: N/A;  . LAPAROSCOPIC APPENDECTOMY     Social History  Substance Use Topics  . Smoking status: Current Every Day Smoker    Packs/day: 0.20    Types: Cigarettes  . Smokeless tobacco: Never Used     Comment: 3 cigarettes per day  . Alcohol use 6.0 oz/week    10 Glasses of wine per week     Comment: working to reduce this due to illness   Family History  Problem Relation Age of Onset  . Stroke Father   . Stroke Brother   . Stroke Brother      Current medication list and allergy/intolerance information reviewed:   Current Outpatient Prescriptions  Medication Sig Dispense Refill  . famotidine (PEPCID) 20 MG tablet Take 1 tablet (20 mg  total) by mouth 2 (two) times daily. 30 tablet 0  . ferrous gluconate (FERGON) 240 (27 FE) MG tablet Take 240 mg by mouth 2 (two) times daily.     Marland Kitchen ibuprofen (ADVIL,MOTRIN) 200 MG tablet Take 200-400 mg by mouth every 6 (six) hours as needed for moderate pain.    . Multiple Vitamin (MULTIVITAMIN WITH MINERALS) TABS Take 1 tablet by mouth daily. 30 tablet 0  . promethazine (PHENERGAN) 25 MG suppository Place 1 suppository (25 mg total) rectally every 6 (six) hours as needed for nausea or vomiting. 12 each 0  . thiamine 100 MG tablet Take 1 tablet (100 mg total) by mouth daily. 30 tablet 0  . feeding supplement (BOOST / RESOURCE BREEZE) LIQD Take 1 Container by mouth 2 (two) times daily between meals. (Patient not taking: Reported on 12/21/2016) 14 Container 11  . ondansetron (ZOFRAN ODT) 8 MG disintegrating tablet Take 1 tablet (8 mg total) by mouth every 8 (eight) hours as needed for nausea or vomiting. (Patient not taking: Reported on 12/21/2016) 10 tablet 0  . ranitidine (ZANTAC) 150 MG tablet Take 1 tablet (150 mg total) by mouth 2 (two) times daily. (Patient not taking: Reported on 09/05/2016) 30 tablet 0   No current facility-administered medications for this visit.  Allergies  Allergen Reactions  . Codeine Nausea And Vomiting  . Flexeril [Cyclobenzaprine] Itching  . Sulfa Antibiotics     Pt does not recall what the reaction was,been too long  . Synthroid [Levothyroxine] Rash      Review of Systems:  Constitutional:  No  fever, no chills, No recent illness  HEENT: No  headache, no vision change,  Cardiac: No  chest pain, No  pressure, No palpitations despite tachycardic, No  Orthopnea  Respiratory:  No  shortness of breath. No  Cough  Gastrointestinal: No  abdominal pain, No  nausea  Musculoskeletal: +new myalgia/arthralgia - rib/chest pain as per HPI  Skin: No  Rash, No other wounds/concerning lesions  Neurologic: No  weakness, No  dizziness,   Psychiatric: No  concerns  with depression, +  concerns with anxiety  Exam:  BP (!) 146/90   Pulse (!) 140   Resp 18   Wt 102 lb (46.3 kg)   SpO2 99%   BMI 18.66 kg/m   Constitutional: VS see above. General Appearance: alert, well-developed, well-nourished, NAD but anxious  Eyes: Normal lids and conjunctive, non-icteric sclera  Ears, Nose, Mouth, Throat: MMM, Normal external inspection ears/nares/mouth/lips/gums. TM normal bilaterally. Pharynx/tonsils no erythema, no exudate. Nasal mucosa normal.   Neck: No masses, trachea midline. No thyroid enlargement  Respiratory: Normal respiratory effort. no wheeze, no rhonchi, no rales  Cardiovascular: S1/S2 normal, no murmur, no rub/gallop auscultated. Tachycardic but regular. No lower extremity edema. Pedal pulse II/IV bilaterally DP and PT.   Gastrointestinal: Nontender, no masses.  Musculoskeletal: Gait normal. No clubbing/cyanosis of digits. Chest wall/rib pain under left breast at costochondral junction of ribs apprx ribs 7/8/9   Neurological: No cranial nerve deficit on limited exam Normal balance/coordination. No tremor.   Skin: warm, dry, intact. No rash/ulcer.    Psychiatric: Normal judgment/insight. Normal mood and affect. Oriented x3.    Dg Chest 2 View  Result Date: 12/21/2016 CLINICAL DATA:  Chest and right rib pain EXAM: CHEST  2 VIEW COMPARISON:  09/05/2016 FINDINGS: Normal heart size and mediastinal contours. No acute infiltrate or edema. No effusion or pneumothorax. Remote lateral right ninth rib fracture. No acute osseous findings. Mild scoliosis. Status post ACDF. IMPRESSION: No active cardiopulmonary disease. Electronically Signed   By: Monte Fantasia M.D.   On: 12/21/2016 16:11    EKG interpretation: Rate: 122 Rhythm: sinus Tachycardic but regular There is a good deal of artifact but no inverted T waves or other concerning ST changes discerned Last EKG also tachycardic    ASSESSMENT/PLAN:   Costochondritis, acute - Advised  anti-inflammatory treatment and give this time, ER/RTC precautions reviewed - Plan: naproxen (NAPROSYN) 500 MG tablet  Other chest pain - Plan: EKG 12-Lead, DG Chest 2 View  Sinus tachycardia - Patient adamantly declines further workup, she verbalizes understanding that we may be missing serious or life-threatening diagnosis. States will f/u w/ PCP. Notably, problem list includes previous cocaine abuse, pt denies this today.   Tobacco use disorder  Patient Instructions   You have declined any blood-work today and have voiced understanding that this may result in a missed diagnosis and potentially serious outcomes. Please follow-up with your primary care provider for routine care.   I believe the chest/rib pain can be attributed to a condition called costochondritis which isn't inflammation of the cartilage in the rib cage. Please see below for further information. The way we treat this is with anti-inflammatories and time. If you experience any new or concerning symptoms,  if anything gets worse or changes, please let us know right away or seek care in the emergency department.   Costochondritis Costochondritis is swelling and irritation (inflammation) of the tissue (cartilage) that connects your ribs to your breastbone (sternum). This causes pain in the front of your chest. The pain usually starts gradually and involves more than one rib. What are the causes? The exact cause of this condition is not always known. It results from stress on the cartilage where your ribs attach to your sternum. The cause of this stress could be:  Chest injury (trauma).  Exercise or activity, such as lifting.  Severe coughing. What increases the risk? You may be at higher risk for this condition if you:  Are female.  Are 32?56 years old.  Recently started a new exercise or work activity.  Have low levels of vitamin D.  Have a condition that makes you cough frequently. What are the signs or  symptoms? The main symptom of this condition is chest pain. The pain:  Usually starts gradually and can be sharp or dull.  Gets worse with deep breathing, coughing, or exercise.  Gets better with rest.  May be worse when you press on the sternum-rib connection (tenderness). How is this diagnosed? This condition is diagnosed based on your symptoms, medical history, and a physical exam. Your health care provider will check for tenderness when pressing on your sternum. This is the most important finding. You may also have tests to rule out other causes of chest pain. These may include:  A chest X-ray to check for lung problems.  An electrocardiogram (ECG) to see if you have a heart problem that could be causing the pain.  An imaging scan to rule out a chest or rib fracture. How is this treated? This condition usually goes away on its own over time. Your health care provider may prescribe an NSAID to reduce pain and inflammation. Your health care provider may also suggest that you:  Rest and avoid activities that make pain worse.  Apply heat or cold to the area to reduce pain and inflammation.  Do exercises to stretch your chest muscles. If these treatments do not help, your health care provider may inject a numbing medicine at the sternum-rib connection to help relieve the pain. Follow these instructions at home:  Avoid activities that make pain worse. This includes any activities that use chest, abdominal, and side muscles.  If directed, put ice on the painful area:  Put ice in a plastic bag.  Place a towel between your skin and the bag.  Leave the ice on for 20 minutes, 2-3 times a day.  If directed, apply heat to the affected area as often as told by your health care provider. Use the heat source that your health care provider recommends, such as a moist heat pack or a heating pad.  Place a towel between your skin and the heat source.  Leave the heat on for 20-30  minutes.  Remove the heat if your skin turns bright red. This is especially important if you are unable to feel pain, heat, or cold. You may have a greater risk of getting burned.  Take over-the-counter and prescription medicines only as told by your health care provider.  Return to your normal activities as told by your health care provider. Ask your health care provider what activities are safe for you.  Keep all follow-up visits as told by your health care provider. This is important. Contact a  health care provider if:  You have chills or a fever.  Your pain does not go away or it gets worse.  You have a cough that does not go away (is persistent). Get help right away if:  You have shortness of breath. This information is not intended to replace advice given to you by your health care provider. Make sure you discuss any questions you have with your health care provider. Document Released: 08/15/2005 Document Revised: 05/25/2016 Document Reviewed: 02/29/2016 Elsevier Interactive Patient Education  2017 [employer].    IF you received an x-ray today, you will receive an invoice from [facility]. Please contact [facility] at [phone] with questions or concerns regarding your invoice.   IF you received labwork today, you will receive an invoice from [facility]. Please contact [facility] at [phone] with questions or concerns regarding your invoice.   Our billing staff will not be able to assist you with questions regarding bills from these companies.  You will be contacted with the lab results as soon as they are available. The fastest way to get your results is to activate your My Chart account. Instructions are located on the last page of this paperwork. If you have not heard from [place] regarding the results in 2 weeks, please contact this office.            Visit summary with medication list and pertinent instructions was printed for patient to review.  All questions at time of visit were answered - patient instructed to contact office with any additional concerns. ER/RTC precautions were reviewed with the patient. Follow-up plan: Return for Establishing care with PCP, recheck costochondritis in 1-2 weeks if no better., sooner if needed.

## 2017-04-01 DIAGNOSIS — R112 Nausea with vomiting, unspecified: Secondary | ICD-10-CM | POA: Diagnosis not present

## 2017-04-01 DIAGNOSIS — K219 Gastro-esophageal reflux disease without esophagitis: Secondary | ICD-10-CM | POA: Diagnosis not present

## 2017-05-03 DIAGNOSIS — R112 Nausea with vomiting, unspecified: Secondary | ICD-10-CM | POA: Diagnosis not present

## 2017-05-03 DIAGNOSIS — D649 Anemia, unspecified: Secondary | ICD-10-CM | POA: Diagnosis not present

## 2017-05-03 DIAGNOSIS — R748 Abnormal levels of other serum enzymes: Secondary | ICD-10-CM | POA: Diagnosis not present

## 2017-07-25 DIAGNOSIS — R531 Weakness: Secondary | ICD-10-CM | POA: Diagnosis not present

## 2018-02-06 ENCOUNTER — Telehealth: Payer: Self-pay | Admitting: Physician Assistant

## 2018-02-06 NOTE — Telephone Encounter (Signed)
Copied from [place] [phone]. Topic: Quick Communication - See Telephone Encounter >> Feb 06, 2018  1:49 PM Boyd Kerbs wrote: CRM for notification. See Telephone encounter for: 02/06/18.  Pt is needing referral to have mammogram done.   Please call pt so will know where

## 2018-02-08 NOTE — Telephone Encounter (Signed)
Please advise. Pt last saw [facility] on 12/21/16

## 2018-02-10 NOTE — Telephone Encounter (Signed)
Patient is due for screening mammogram, can see from last visit on 12/21/16 with Colletta [place] English she had concerns regarding right breast pain.   Provider, please order imaging if appropriate.

## 2018-02-19 ENCOUNTER — Encounter: Payer: Self-pay | Admitting: Physician Assistant

## 2019-02-11 ENCOUNTER — Emergency Department (HOSPITAL_COMMUNITY)
Admission: EM | Admit: 2019-02-11 | Discharge: 2019-02-11 | Disposition: A | Discharge status: Home / Self Care | Payer: BLUE CROSS/BLUE SHIELD | Attending: Emergency Medicine | Admitting: Emergency Medicine

## 2019-02-11 ENCOUNTER — Encounter (HOSPITAL_COMMUNITY): Payer: Self-pay

## 2019-02-11 ENCOUNTER — Other Ambulatory Visit: Payer: Self-pay

## 2019-02-11 DIAGNOSIS — Z79899 Other long term (current) drug therapy: Secondary | ICD-10-CM | POA: Insufficient documentation

## 2019-02-11 DIAGNOSIS — I10 Essential (primary) hypertension: Secondary | ICD-10-CM | POA: Insufficient documentation

## 2019-02-11 DIAGNOSIS — F17210 Nicotine dependence, cigarettes, uncomplicated: Secondary | ICD-10-CM | POA: Insufficient documentation

## 2019-02-11 DIAGNOSIS — F1410 Cocaine abuse, uncomplicated: Secondary | ICD-10-CM | POA: Diagnosis not present

## 2019-02-11 DIAGNOSIS — E039 Hypothyroidism, unspecified: Secondary | ICD-10-CM | POA: Diagnosis not present

## 2019-02-11 DIAGNOSIS — R000 Tachycardia, unspecified: Secondary | ICD-10-CM | POA: Diagnosis not present

## 2019-02-11 DIAGNOSIS — F419 Anxiety disorder, unspecified: Secondary | ICD-10-CM | POA: Diagnosis not present

## 2019-02-11 DIAGNOSIS — R112 Nausea with vomiting, unspecified: Secondary | ICD-10-CM | POA: Diagnosis not present

## 2019-02-11 LAB — CBC WITH DIFFERENTIAL/PLATELET
Abs Immature Granulocytes: 0.04 10*3/uL (ref 0.00–0.07)
Basophils Absolute: 0.1 10*3/uL (ref 0.0–0.1)
Basophils Relative: 1 %
Eosinophils Absolute: 0 10*3/uL (ref 0.0–0.5)
Eosinophils Relative: 0 %
HCT: 39.6 % (ref 36.0–46.0)
Hemoglobin: 13.6 g/dL (ref 12.0–15.0)
Immature Granulocytes: 0 %
Lymphocytes Relative: 6 %
Lymphs Abs: 0.5 10*3/uL — ABNORMAL LOW (ref 0.7–4.0)
MCH: 35.8 pg — ABNORMAL HIGH (ref 26.0–34.0)
MCHC: 34.3 g/dL (ref 30.0–36.0)
MCV: 104.2 fL — ABNORMAL HIGH (ref 80.0–100.0)
Monocytes Absolute: 0.9 10*3/uL (ref 0.1–1.0)
Monocytes Relative: 10 %
Neutro Abs: 7.8 10*3/uL — ABNORMAL HIGH (ref 1.7–7.7)
Neutrophils Relative %: 83 %
Platelets: 305 10*3/uL (ref 150–400)
RBC: 3.8 MIL/uL — ABNORMAL LOW (ref 3.87–5.11)
RDW: 13.2 % (ref 11.5–15.5)
WBC: 9.5 10*3/uL (ref 4.0–10.5)
nRBC: 0 % (ref 0.0–0.2)

## 2019-02-11 LAB — COMPREHENSIVE METABOLIC PANEL
ALT: 88 U/L — ABNORMAL HIGH (ref 0–44)
AST: 174 U/L — ABNORMAL HIGH (ref 15–41)
Albumin: 5.3 g/dL — ABNORMAL HIGH (ref 3.5–5.0)
Alkaline Phosphatase: 147 U/L — ABNORMAL HIGH (ref 38–126)
Anion gap: 22 — ABNORMAL HIGH (ref 5–15)
BUN: 14 mg/dL (ref 6–20)
CO2: 24 mmol/L (ref 22–32)
Calcium: 13.2 mg/dL (ref 8.9–10.3)
Chloride: 91 mmol/L — ABNORMAL LOW (ref 98–111)
Creatinine, Ser: 0.98 mg/dL (ref 0.44–1.00)
GFR calc Af Amer: >60 mL/min (ref >60)
GFR calc non Af Amer: >60 mL/min (ref >60)
Glucose, Bld: 118 mg/dL — ABNORMAL HIGH (ref 70–99)
Potassium: 3.5 mmol/L (ref 3.5–5.1)
Sodium: 137 mmol/L (ref 135–145)
Total Bilirubin: 1.5 mg/dL — ABNORMAL HIGH (ref 0.3–1.2)
Total Protein: 9.4 g/dL — ABNORMAL HIGH (ref 6.5–8.1)

## 2019-02-11 LAB — LIPASE, BLOOD: Lipase: 28 U/L (ref 11–51)

## 2019-02-11 LAB — BASIC METABOLIC PANEL
Anion gap: 16 — ABNORMAL HIGH (ref 5–15)
BUN: 13 mg/dL (ref 6–20)
CO2: 22 mmol/L (ref 22–32)
Calcium: 10.1 mg/dL (ref 8.9–10.3)
Chloride: 99 mmol/L (ref 98–111)
Creatinine, Ser: 0.86 mg/dL (ref 0.44–1.00)
GFR calc Af Amer: >60 mL/min (ref >60)
GFR calc non Af Amer: >60 mL/min (ref >60)
Glucose, Bld: 78 mg/dL (ref 70–99)
Potassium: 3.1 mmol/L — ABNORMAL LOW (ref 3.5–5.1)
Sodium: 137 mmol/L (ref 135–145)

## 2019-02-11 LAB — LACTIC ACID, PLASMA: Lactic Acid, Venous: 1.2 mmol/L (ref 0.5–1.9)

## 2019-02-11 MED ORDER — THIAMINE HCL 100 MG/ML IJ SOLN
100.0000 mg | Freq: Every day | INTRAMUSCULAR | Status: DC
  Administered 2019-02-11: 100 mg via INTRAVENOUS
  Filled 2019-02-11: qty 2

## 2019-02-11 MED ORDER — VITAMIN B-1 100 MG PO TABS: 100.0000 mg | ORAL_TABLET | Freq: Every day | ORAL | Status: DC

## 2019-02-11 MED ORDER — LORAZEPAM 1 MG PO TABS
0.0000 mg | ORAL_TABLET | Freq: Four times a day (QID) | ORAL | Status: DC
  Administered 2019-02-11: 1 mg via ORAL
  Filled 2019-02-11: qty 1

## 2019-02-11 MED ORDER — LORAZEPAM 1 MG PO TABS: 0.0000 mg | ORAL_TABLET | Freq: Two times a day (BID) | ORAL | Status: DC

## 2019-02-11 MED ORDER — PROMETHAZINE HCL 25 MG RE SUPP: 25.0000 mg | Freq: Three times a day (TID) | RECTAL | 0 refills | Status: DC | PRN

## 2019-02-11 MED ORDER — SODIUM CHLORIDE 0.9 % IV BOLUS
1000.0000 mL | Freq: Once | INTRAVENOUS | Status: AC
  Administered 2019-02-11: 1000 mL via INTRAVENOUS

## 2019-02-11 MED ORDER — LORAZEPAM 2 MG/ML IJ SOLN: 0.0000 mg | Freq: Two times a day (BID) | INTRAMUSCULAR | Status: DC

## 2019-02-11 MED ORDER — LORAZEPAM 2 MG/ML IJ SOLN: 0.0000 mg | Freq: Four times a day (QID) | INTRAMUSCULAR | Status: DC

## 2019-02-11 MED ORDER — METOCLOPRAMIDE HCL 5 MG/ML IJ SOLN
10.0000 mg | INTRAMUSCULAR | Status: AC
  Administered 2019-02-11: 10 mg via INTRAVENOUS
  Filled 2019-02-11: qty 2

## 2019-02-11 MED ORDER — LACTATED RINGERS IV BOLUS
1000.0000 mL | Freq: Once | INTRAVENOUS | Status: AC
  Administered 2019-02-11: 1000 mL via INTRAVENOUS

## 2019-02-11 NOTE — ED Triage Notes (Signed)
Pt arrives via POV from home. Pt reports that she has had N/V for 5 days. Pt denies diarrhea. Pt states last BM was 5 days ago.

## 2019-02-11 NOTE — ED Provider Notes (Signed)
[facility] DEPT Provider Note   CSN: [id number] Arrival date & time: 02/11/19  1122    History   Chief Complaint Chief Complaint  Patient presents with  . Emesis    HPI Holly Phelps is a 58 y.o. female.     58 year old female with past medical history including hypertension, hypothyroidism, GERD, polysubstance abuse who presents with nausea and vomiting.  Patient states that she has had 5 days of persistent nausea and vomiting.  She has not had a bowel movement in 5 days which is unusual for her.  She denies any associated fevers or URI symptoms.  No urinary symptoms.  Reports occasional abd pain when vomiting, no severe abd pain. She denies any sick contacts or recent travel.  No CP or SOB. She states she normally drinks approximately 2 alcoholic drinks per day, last drink was yesterday evening.  She occasionally uses cocaine, no use in the past 24 hours.  Denies any IV drug use.  The history is provided by the patient.  Emesis    Past Medical History:  Diagnosis Date  . Allergy   . Anemia   . Calcification of aorta (HCC)   . Fatty liver   . GERD (gastroesophageal reflux disease)   . Headache   . Hypertension   . Hypothyroidism   . 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 Need for hepatitis B vaccination 01/12/2016  . Calcification of aorta (HCC)   . Recurrent Clostridium difficile diarrhea 11/18/2015  . Macrocytic anemia 11/18/2015  . Nausea and vomiting 11/18/2015  . Leucocytosis 11/17/2015  . Protein-calorie malnutrition, severe 11/17/2015  . Hypomagnesemia 09/08/2015  . Hypokalemia 09/07/2015  . Tobacco use disorder 09/07/2015  . Cocaine abuse ([place]) 05/27/2015  . Anemia 01/29/2013  . Nausea vomiting and diarrhea 01/26/2013  . Esophagitis 01/26/2013  . ANXIETY 03/01/2008  . Alcohol abuse 03/01/2008  . Elevated LFTs 03/01/2008    Past Surgical History:  Procedure Laterality Date   . BACK SURGERY     x 2  . COLONOSCOPY N/A 03/13/2013   Procedure: COLONOSCOPY;  Surgeon: Leighton Ruff, MD;  Location: [facility];  Service: Endoscopy;  Laterality: N/A;  . LAPAROSCOPIC APPENDECTOMY       OB History   No obstetric history on file.      Home Medications    Prior to Admission medications   Medication Sig Start Date End Date Taking? Authorizing Provider  Acetaminophen (TYLENOL PO) Take 1-2 tablets by mouth daily as needed (pain).   Yes [provider]  feeding supplement (BOOST / RESOURCE BREEZE) LIQD Take 1 Container by mouth 2 (two) times daily between meals. 11/19/15  Yes Rama, Venetia Maxon, MD  ferrous gluconate (FERGON) 240 (27 FE) MG tablet Take 240 mg by mouth 2 (two) times daily.    Yes [provider]  ibuprofen (ADVIL,MOTRIN) 200 MG tablet Take 200-400 mg by mouth every 6 (six) hours as needed for moderate pain.   Yes [provider]  Multiple Vitamin (MULTIVITAMIN WITH MINERALS) TABS Take 1 tablet by mouth daily. 01/29/13  Yes Dhungel, Nishant, MD  Tetrahydrozoline HCl (VISINE OP) Apply 1 drop to eye daily as needed (dry eyes).   Yes [provider]  thiamine 100 MG tablet Take 1 tablet (100 mg total) by mouth daily. 04/27/15  Yes Vann, Jessica U, DO  famotidine (PEPCID) 20 MG tablet Take 1 tablet (20 mg total) by mouth  2 (two) times daily. Patient not taking: Reported on 02/11/2019 09/05/16   Isla Pence, MD  naproxen (NAPROSYN) 500 MG tablet Take 1 tablet (500 mg total) by mouth 2 (two) times daily with a meal. Patient not taking: Reported on 02/11/2019 12/21/16   Emeterio Reeve, DO  ondansetron (ZOFRAN ODT) 8 MG disintegrating tablet Take 1 tablet (8 mg total) by mouth every 8 (eight) hours as needed for nausea or vomiting. Patient not taking: Reported on 12/21/2016 06/22/16   Street, [place], PA-C  promethazine (PHENERGAN) 25 MG suppository Place 1 suppository (25 mg total) rectally every 6 (six) hours as needed for nausea  or vomiting. Patient not taking: Reported on 02/11/2019 09/05/16   Isla Pence, MD  ranitidine (ZANTAC) 150 MG tablet Take 1 tablet (150 mg total) by mouth 2 (two) times daily. Patient not taking: Reported on 09/05/2016 06/22/16   Street, [place], PA-C    Family History Family History  Problem Relation Age of Onset  . Stroke Father   . Stroke Brother   . Stroke Brother     Social History Social History   Tobacco Use  . Smoking status: Current Every Day Smoker    Packs/day: 0.20    Types: Cigarettes  . Smokeless tobacco: Never Used  . Tobacco comment: 3 cigarettes per day  Substance Use Topics  . Alcohol use: Yes    Alcohol/week: 10.0 standard drinks    Types: 10 Glasses of wine per week    Comment: working to reduce this due to illness  . Drug use: Yes    Types: Cocaine    Comment: rarely      Allergies   Codeine; Flexeril [cyclobenzaprine]; Sulfa antibiotics; and Synthroid [levothyroxine]   Review of Systems Review of Systems  Gastrointestinal: Positive for vomiting.   All other systems reviewed and are negative except that which was mentioned in HPI   Physical Exam Updated Vital Signs BP (!) 175/104   Pulse (!) 110   Temp 98.8 F (37.1 C) (Oral)   Resp 16   Ht 5\' 2"  (1.575 m)   Wt 47.6 kg   SpO2 97%   BMI 19.20 kg/m   Physical Exam Vitals signs and nursing note reviewed.  Constitutional:      General: She is not in acute distress.    Appearance: She is well-developed.     Comments: Chronically ill appearing  HENT:     Head: Normocephalic and atraumatic.  Eyes:     Conjunctiva/sclera: Conjunctivae normal.     Pupils: Pupils are equal, round, and reactive to light.  Neck:     Musculoskeletal: Neck supple.  Cardiovascular:     Rate and Rhythm: Normal rate and regular rhythm.     Heart sounds: Normal heart sounds. No murmur.  Pulmonary:     Effort: Pulmonary effort is normal.     Breath sounds: Normal breath sounds.  Abdominal:      General: Bowel sounds are normal. There is no distension.     Palpations: Abdomen is soft.     Tenderness: There is no abdominal tenderness.  Skin:    General: Skin is warm and dry.  Neurological:     Mental Status: She is alert and oriented to person, place, and time.     Comments: Fluent speech  Psychiatric:        Judgment: Judgment normal.      ED Treatments / Results  Labs (all labs ordered are listed, but only abnormal results are displayed) Labs Reviewed  COMPREHENSIVE METABOLIC PANEL - Abnormal; Notable for the following components:      Result Value   Chloride 91 (*)    Glucose, Bld 118 (*)    Calcium 13.2 (*)    Total Protein 9.4 (*)    Albumin 5.3 (*)    AST 174 (*)    ALT 88 (*)    Alkaline Phosphatase 147 (*)    Total Bilirubin 1.5 (*)    Anion gap 22 (*)    All other components within normal limits  CBC WITH DIFFERENTIAL/PLATELET - Abnormal; Notable for the following components:   RBC 3.80 (*)    MCV 104.2 (*)    MCH 35.8 (*)    Neutro Abs 7.8 (*)    Lymphs Abs 0.5 (*)    All other components within normal limits  LIPASE, BLOOD  LACTIC ACID, PLASMA  LACTIC ACID, PLASMA  URINALYSIS, ROUTINE W REFLEX MICROSCOPIC  BASIC METABOLIC PANEL    EKG EKG Interpretation  Date/Time:  Wednesday February 11 2019 11:33:45 EDT Ventricular Rate:  127 PR Interval:    QRS Duration: 75 QT Interval:  303 QTC Calculation: 441 R Axis:   56 Text Interpretation:  Sinus tachycardia LAE, consider biatrial enlargement Probable anteroseptal infarct, old similar to previous Confirmed by Theotis Burrow [phone]) on 02/11/2019 12:00:35 PM   Radiology No results found.  Procedures Procedures (including critical care time)  Medications Ordered in ED Medications  LORazepam (ATIVAN) injection 0-4 mg ( Intravenous See Alternative 02/11/19 1504)    Or  LORazepam (ATIVAN) tablet 0-4 mg (1 mg Oral Given 02/11/19 1504)  LORazepam (ATIVAN) injection 0-4 mg (has no administration in  time range)    Or  LORazepam (ATIVAN) tablet 0-4 mg (has no administration in time range)  thiamine (VITAMIN B-1) tablet 100 mg ( Oral See Alternative 02/11/19 1509)    Or  thiamine (B-1) injection 100 mg (100 mg Intravenous Given 02/11/19 1509)  metoCLOPramide (REGLAN) injection 10 mg (10 mg Intravenous Given 02/11/19 1229)  lactated ringers bolus 1,000 mL ( Intravenous Stopped 02/11/19 1456)  sodium chloride 0.9 % bolus 1,000 mL (1,000 mLs Intravenous New Bag/Given 02/11/19 1422)     Initial Impression / Assessment and Plan / ED Course  I have reviewed the triage vital signs and the nursing notes.  Pertinent labs & imaging results that were available during my care of the patient were reviewed by me and considered in my medical decision making (see chart for details).        No focal abd tenderness on exam.  Lab work shows normal lactate, CMP notable for normal creatinine, calcium 13, anion gap 22, AST and ALT elevated consistent with previous, likely due to her alcohol use.  Normal lipase.  CBC shows normal WBC count and hemoglobin.  Gave the patient several IV fluid boluses and Reglan.  Lab work notable for chloride 91, normal creatinine, calcium 13.2, AST 174, ALT 88, total bilirubin 1.5, anion gap 22.  I suspect that these derangements are related to alcohol use, has similar pattern of LFTs on previous lab work.  Lipase is normal. CBC reassuring.  She is tolerating liquids on repeat exam and given no focal abdominal tenderness, I do not feel she needs any imaging at this time.  I have placed her on CIWA protocol for mild tachycardia and HTN. She is otherwise comfortable on repeat exam. Will obtain repeat calcium after IVF bolus.  If improving, I anticipate that she will be eligible for discharge.  She  admits to taking a lot of Tums over the past few days and I have instructed her to discontinue this and discontinue other dietary sources of calcium.  I have emphasized the importance of  following up with PCP for recheck of calcium and further work-up if it continues to be elevated.  I am signing out to the oncoming provider who will follow up on repeat lab work. Final Clinical Impressions(s) / ED Diagnoses   Final diagnoses:  None    ED Discharge Orders    None       Alezandra Egli, Wenda Overland, MD 02/11/19 [phone]

## 2019-02-11 NOTE — ED Notes (Signed)
CRITICAL VALUE ALERT  Critical Value:  Calcium 13.2  Date & Time Notied:  02/11/2019 1247  Provider Notified: Little MD   Orders Received/Actions taken: Provider made aware

## 2019-08-28 DIAGNOSIS — R748 Abnormal levels of other serum enzymes: Secondary | ICD-10-CM | POA: Diagnosis not present

## 2019-09-18 ENCOUNTER — Other Ambulatory Visit: Payer: Self-pay

## 2019-09-18 ENCOUNTER — Ambulatory Visit: Payer: BLUE CROSS/BLUE SHIELD | Admitting: Registered Nurse

## 2019-09-18 ENCOUNTER — Encounter: Payer: Self-pay | Admitting: Registered Nurse

## 2019-09-18 ENCOUNTER — Ambulatory Visit (INDEPENDENT_AMBULATORY_CARE_PROVIDER_SITE_OTHER): Payer: BLUE CROSS/BLUE SHIELD | Admitting: Registered Nurse

## 2019-09-18 VITALS — BP 131/74 | HR 99 | Temp 98.5°F | Resp 16 | Ht 62.0 in | Wt 110.0 lb

## 2019-09-18 DIAGNOSIS — B354 Tinea corporis: Secondary | ICD-10-CM

## 2019-09-18 MED ORDER — KETOCONAZOLE 2 % EX CREA: 1.0000 "application " | TOPICAL_CREAM | Freq: Every day | CUTANEOUS | 0 refills | Status: DC

## 2019-09-18 MED ORDER — BETAMETHASONE DIPROPIONATE 0.05 % EX CREA: TOPICAL_CREAM | Freq: Two times a day (BID) | CUTANEOUS | 0 refills | Status: DC

## 2019-09-18 NOTE — Patient Instructions (Signed)
° ° ° °  If you have lab work done today you will be contacted with your lab results within the next 2 weeks.  If you have not heard from us then please contact us. The fastest way to get your results is to register for My Chart. ° ° °IF you received an x-ray today, you will receive an invoice from [facility]. Please contact [facility] at [phone] with questions or concerns regarding your invoice.  ° °IF you received labwork today, you will receive an invoice from [facility]. Please contact [facility] at [phone] with questions or concerns regarding your invoice.  ° °Our billing staff will not be able to assist you with questions regarding bills from these companies. ° °You will be contacted with the lab results as soon as they are available. The fastest way to get your results is to activate your My Chart account. Instructions are located on the last page of this paperwork. If you have not heard from us regarding the results in 2 weeks, please contact this office. °  ° ° ° °

## 2019-09-18 NOTE — Progress Notes (Signed)
Acute Office Visit  Subjective:    Patient ID: Holly Phelps, female    DOB: 11/28/1960, 58 y.o.   MRN: [id number]  Chief Complaint  Patient presents with  . Pruritis    neck down to her thigh/ x 2 months    HPI Patient is in today for ongoing pruritis for 2 mos Getting worse Notes that two months ago, her W/D broke so she has been having laundry done at [employer]. Did not change detergent, though. Temporarily changed soap, but back to normal soap now. No changes to diet or medication. States itching is greatest from neck to hips, across front and back of trunk, and on upper arms. Denies nvd, fever, chills, headaches, shob, doe, itchy palms and soles, rash, infestation. Notes that the itching has interfered with her sleep.  Was seen at [facility] MDs walk in clinic. Was given oral medication, but does not recall name. Had lab work drawn, states that results were overall good but liver tests were elevated. They have been consistently elevated in the past, the suspected etiology is EtOH use. We have requested those labs and the patient has signed a release.   Past Medical History:  Diagnosis Date  . Allergy   . Anemia   . Calcification of aorta (HCC)   . Fatty liver   . GERD (gastroesophageal reflux disease)   . Headache   . Hypertension   . Hypothyroidism   . 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 BACK SURGERY     x 2  . COLONOSCOPY N/A 03/13/2013   Procedure: COLONOSCOPY;  Surgeon: Leighton Ruff, MD;  Location: [facility];  Service: Endoscopy;  Laterality: N/A;  . LAPAROSCOPIC APPENDECTOMY      Family History  Problem Relation Age of Onset  . Stroke Father   . Stroke Brother   . Stroke Brother     Social History   Socioeconomic History  . Marital status: Married    Spouse name: [name]  . Number of children: 0  . Years of education: College  . Highest education level: Not on file  Occupational History  . 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 Financial resource strain: Not on file  . Food insecurity    Worry: Not on file    Inability: Not on file  . Transportation needs    Medical: Not on file    Non-medical: Not on file  Tobacco Use  . Smoking status: Current Every Day Smoker    Packs/day: 0.20    Types: Cigarettes  . Smokeless tobacco: Never Used  . Tobacco comment: 3 cigarettes per day  Substance and Sexual Activity  . Alcohol use: Yes    Alcohol/week: 10.0 standard drinks    Types: 10 Glasses of wine per week    Comment: working to reduce this due to illness  . Drug use: Yes    Types: Cocaine    Comment: rarely   . Sexual activity: Not Currently  Lifestyle  . Physical activity    Days per week: Not on file    Minutes per session: Not on file  . Stress: Not on file  Relationships  . Social Herbalist on phone: Not on file    Gets together: Not on file    Attends religious service: Not on file    Active member of club or organization: Not on file    Attends  meetings of clubs or organizations: Not on file    Relationship status: Not on file  . Intimate partner violence    Fear of current or ex partner: Not on file    Emotionally abused: Not on file    Physically abused: Not on file    Forced sexual activity: Not on file  Other Topics Concern  . Not on file  Social History Narrative   Lives with her husband.   No children.    Outpatient Medications Prior to Visit  Medication Sig Dispense Refill  . Acetaminophen (TYLENOL PO) Take 1-2 tablets by mouth daily as needed (pain).    . feeding supplement (BOOST / RESOURCE BREEZE) LIQD Take 1 Container by mouth 2 (two) times daily between meals. 14 Container 11  . Pedialyte (PEDIALYTE) SOLN Take by mouth.    . promethazine (PHENERGAN) 25 MG suppository Place 1 suppository (25 mg total) rectally every 8 (eight) hours as needed for nausea or vomiting. 4 each 0  . Tetrahydrozoline HCl (VISINE OP) Apply 1 drop to  eye daily as needed (dry eyes).    . thiamine 100 MG tablet Take 1 tablet (100 mg total) by mouth daily. 30 tablet 0  . ferrous gluconate (FERGON) 240 (27 FE) MG tablet Take 240 mg by mouth 2 (two) times daily.     Marland Kitchen ibuprofen (ADVIL,MOTRIN) 200 MG tablet Take 200-400 mg by mouth every 6 (six) hours as needed for moderate pain.    . naproxen (NAPROSYN) 500 MG tablet Take 1 tablet (500 mg total) by mouth 2 (two) times daily with a meal. (Patient not taking: Reported on 02/11/2019) 30 tablet 0  . ondansetron (ZOFRAN ODT) 8 MG disintegrating tablet Take 1 tablet (8 mg total) by mouth every 8 (eight) hours as needed for nausea or vomiting. (Patient not taking: Reported on 09/18/2019) 10 tablet 0  . ranitidine (ZANTAC) 150 MG tablet Take 1 tablet (150 mg total) by mouth 2 (two) times daily. (Patient not taking: Reported on 09/05/2016) 30 tablet 0  . famotidine (PEPCID) 20 MG tablet Take 1 tablet (20 mg total) by mouth 2 (two) times daily. (Patient not taking: Reported on 02/11/2019) 30 tablet 0   No facility-administered medications prior to visit.     Allergies  Allergen Reactions  . Codeine Nausea And Vomiting  . Flexeril [Cyclobenzaprine] Itching  . Sulfa Antibiotics     Pt does not recall what the reaction was,been too long  . Synthroid [Levothyroxine] Rash    ROS Per hpi     Objective:    Physical Exam  Constitutional: She is oriented to person, place, and time. She appears well-developed and well-nourished. No distress.  Cardiovascular: Normal rate and regular rhythm.  Pulmonary/Chest: Effort normal. No respiratory distress.  Neurological: She is alert and oriented to person, place, and time.  Skin: Skin is warm and dry. No ecchymosis and no rash noted. She is not diaphoretic. No erythema. There is pallor.  ? Small, hypopigmented areas questionable for tinea versicolor? Some mild excoriation from scratching.   Psychiatric: She has a normal mood and affect. Her behavior is normal.  Judgment and thought content normal.  Nursing note and vitals reviewed.   BP 131/74   Pulse 99   Temp 98.5 F (36.9 C) (Oral)   Resp 16   Ht 5\' 2"  (1.575 m)   Wt 110 lb (49.9 kg)   SpO2 92%   BMI 20.12 kg/m  Wt Readings from Last 3 Encounters:  09/18/19 110 lb (  49.9 kg)  02/11/19 105 lb (47.6 kg)  12/21/16 102 lb (46.3 kg)    Health Maintenance Due  Topic Date Due  . HIV Screening  03/02/1976  . PAP SMEAR-Modifier  03/02/1982  . MAMMOGRAM  11/18/2015  . INFLUENZA VACCINE  06/20/2019    There are no preventive care reminders to display for this patient.   Lab Results  Component Value Date   TSH 5.782 (H) 03/13/2015   Lab Results  Component Value Date   WBC 9.5 02/11/2019   HGB 13.6 02/11/2019   HCT 39.6 02/11/2019   MCV 104.2 (H) 02/11/2019   PLT 305 02/11/2019   Lab Results  Component Value Date   NA 137 02/11/2019   K 3.1 (L) 02/11/2019   CO2 22 02/11/2019   GLUCOSE 78 02/11/2019   BUN 13 02/11/2019   CREATININE 0.86 02/11/2019   BILITOT 1.5 (H) 02/11/2019   ALKPHOS 147 (H) 02/11/2019   AST 174 (H) 02/11/2019   ALT 88 (H) 02/11/2019   PROT 9.4 (H) 02/11/2019   ALBUMIN 5.3 (H) 02/11/2019   CALCIUM 10.1 02/11/2019   ANIONGAP 16 (H) 02/11/2019   Lab Results  Component Value Date   CHOL 228 (H) 01/27/2013   Lab Results  Component Value Date   HDL 102 01/27/2013   Lab Results  Component Value Date   LDLCALC 117 (H) 01/27/2013   Lab Results  Component Value Date   TRIG 47 01/27/2013   Lab Results  Component Value Date   CHOLHDL 2.2 01/27/2013   Lab Results  Component Value Date   HGBA1C 4.6 11/16/2015       Assessment & Plan:   Problem List Items Addressed This Visit    None    Visit Diagnoses    Tinea corporis    -  Primary   Relevant Medications   betamethasone dipropionate 0.05 % cream   ketoconazole (NIZORAL) 2 % cream       Meds ordered this encounter  Medications  . betamethasone dipropionate 0.05 % cream    Sig:  Apply topically 2 (two) times daily.    Dispense:  30 g    Refill:  0    Order Specific Question:   Supervising Provider    Answer:   Delia Chimes A [id number]  . ketoconazole (NIZORAL) 2 % cream    Sig: Apply 1 application topically daily.    Dispense:  15 g    Refill:  0    Order Specific Question:   Supervising Provider    Answer:   Forrest Moron [id number]   PLAN  Unclear etiology  She does not know what medication she was given by walk in clinic. As such, we will limit treatment to topical agents.  Ketoconazole for antifungal effect, betamethasone for symptom relief  Discussed nonpharm - clothes may be different texture due to different washer. Clean sheets and mattress. Scratching can lead to more itching. Pt demonstrates understanding  Patient encouraged to call clinic with any questions, comments, or concerns.    Maximiano Coss, NP

## 2019-09-29 ENCOUNTER — Other Ambulatory Visit: Payer: Self-pay | Admitting: Registered Nurse

## 2019-09-29 DIAGNOSIS — B354 Tinea corporis: Secondary | ICD-10-CM

## 2019-09-29 NOTE — Telephone Encounter (Signed)
Requested medication (s) are due for refill today: no  Requested medication (s) are on the active medication list:  yes  Last refill:  09/18/2019  Future visit scheduled: no  Notes to clinic:  Review for refill    Requested Prescriptions  Pending Prescriptions Disp Refills   ketoconazole (NIZORAL) 2 % cream [Pharmacy Med Name: KETOCONAZOLE 2% CREAM] 15 g 0    Sig: APPLY TO AFFECTED AREA(S) ONCE DAILY     Over the Counter:  OTC Failed - 09/29/2019  3:15 PM      Failed - Valid encounter within last 12 months    Recent Outpatient Visits          1 week ago Tinea corporis   [facility], Delfino Lovett, NP   2 years ago Costochondritis, acute   [facility] at [place], [place], [place]   3 years ago Cat scratch   [facility], [facility]   3 years ago Cat scratch   [facility], [facility]   3 years ago Nausea and vomiting, intractability of vomiting not specified, unspecified vomiting type   Primary Care at [facility], [place], [place]              betamethasone dipropionate 0.05 % cream [Pharmacy Med Name: BETAMETHASONE DP 0.05% CRM] 30 g 0    Sig: APPLY TO AFFECTED AREA(S) TWO TIMES A DAY     Off-Protocol Failed - 09/29/2019  3:15 PM      Failed - Medication not assigned to a protocol, review manually.      Failed - Valid encounter within last 12 months    Recent Outpatient Visits          1 week ago Tinea corporis   [facility] at [place], NP   2 years ago Costochondritis, acute   [facility] at [place], [place], [place]   3 years ago Cat scratch   [facility], Loura Back, MD   3 years ago Cat scratch   [facility] at [facility], [facility]   3 years ago Nausea and vomiting, intractability of vomiting not specified, unspecified vomiting type   Primary Care at [facility], [place], [place]

## 2019-10-12 NOTE — Telephone Encounter (Signed)
Refilled medication

## 2019-10-14 DIAGNOSIS — L299 Pruritus, unspecified: Secondary | ICD-10-CM | POA: Diagnosis not present

## 2019-10-14 DIAGNOSIS — D649 Anemia, unspecified: Secondary | ICD-10-CM | POA: Diagnosis not present

## 2019-10-14 DIAGNOSIS — R748 Abnormal levels of other serum enzymes: Secondary | ICD-10-CM | POA: Diagnosis not present

## 2019-11-11 DIAGNOSIS — R748 Abnormal levels of other serum enzymes: Secondary | ICD-10-CM | POA: Diagnosis not present

## 2019-11-11 DIAGNOSIS — D529 Folate deficiency anemia, unspecified: Secondary | ICD-10-CM | POA: Diagnosis not present

## 2019-11-11 DIAGNOSIS — D509 Iron deficiency anemia, unspecified: Secondary | ICD-10-CM | POA: Diagnosis not present

## 2019-11-11 DIAGNOSIS — F1010 Alcohol abuse, uncomplicated: Secondary | ICD-10-CM | POA: Diagnosis not present

## 2019-12-31 ENCOUNTER — Telehealth (INDEPENDENT_AMBULATORY_CARE_PROVIDER_SITE_OTHER): Payer: BC Managed Care – PPO | Admitting: Family Medicine

## 2019-12-31 ENCOUNTER — Other Ambulatory Visit: Payer: Self-pay

## 2019-12-31 VITALS — Ht 62.0 in | Wt 110.0 lb

## 2019-12-31 DIAGNOSIS — Z7289 Other problems related to lifestyle: Secondary | ICD-10-CM

## 2019-12-31 DIAGNOSIS — R112 Nausea with vomiting, unspecified: Secondary | ICD-10-CM

## 2019-12-31 DIAGNOSIS — Z789 Other specified health status: Secondary | ICD-10-CM

## 2019-12-31 MED ORDER — ONDANSETRON 4 MG PO TBDP: 4.0000 mg | ORAL_TABLET | Freq: Three times a day (TID) | ORAL | 0 refills | Status: DC | PRN

## 2019-12-31 NOTE — Progress Notes (Signed)
Virtual Visit via telephone Note  I connected with Holly Phelps on 12/31/19 at 2:16 PM - initial 10 min chart review.  by telephone (no video capability) verified that I am speaking with the correct person using two identifiers.   I discussed the limitations, risks, security and privacy concerns of performing an evaluation and management service by telephone and the availability of in person appointments. I also discussed with the patient that there may be a patient responsible charge related to this service. The patient expressed understanding and agreed to proceed, consent obtained  Chief complaint:  Chief Complaint  Patient presents with  . Emesis    started 3 days ago. pt is staying hydrated. unable to eat solod foods. pt didn't eat any thing spoiled or expiered nor did she eat anything outside the norm for the pt.     History of Present Illness: Holly Phelps is a 59 y.o. female  Emesis: Started 3 days ago. 3-4 episodes per day.  No hematemesis. No diarrhea. Has had some constipation - last BM yesterday. No dark stools. No rectal bleeding  Hard to keep food down. Tried to eat clam chowder- vomited up few hrs later. Drinking some water - able to keep some fluids, pedialyte today. Brought up small amount saliva this am - no vomiting. Feels weak, denies near syncope/syncope. No dizziness.  Did keep 1/2 container yogurt down today.  No abdominal pain.  Urinating today - small amount today- no dysuria.  No fever - temp was normal today 98.  Alcohol - 1 glass of bourbon per day.  Denies recent cocaine use - few years ago last use.  No known sick contacts.  No cough/dyspena/change in taste/smell.  No sick contacts or exposure to covid 19 known.  Feeling better today.   Treated in ED last march for Emesis, 5 days at that time. Alcohol use at that time 2 per day, occasional cocaine use. Elevated LFT's thought to be due to alcohol use, treated with IVF, Reglan. Normal lipase at that  time, reassuring CBC. No focal and TTP - deferred imaging.  Hypercalcemia, treated with IVF, planned outpatient recheck.   Seen by gastroenterology 10/14/19 - transfer form Dr. Paulita Fujita to [facility]. History of anemia, requiring B12 and iron supplementation prior. 2 glasses bourbon per day. Plan for egd, colonoscopy, recommended omeprazole 40mg  qd, alcohol avoidance.  12/23 appt - hgb had improved to 10.1, iron and folate deficient. Taking BID iron 325mg  and otc folate daily.  Planned follow up with Dr. Paulita Fujita. Has appt last month - had to cancel. Has not rescheduled. Has not had EGD, colonoscopy.      Patient Active Problem List   Diagnosis Date Noted  . Need for hepatitis B vaccination 01/12/2016  . Calcification of aorta (HCC)   . Recurrent Clostridium difficile diarrhea 11/18/2015  . Macrocytic anemia 11/18/2015  . Nausea and vomiting 11/18/2015  . Leucocytosis 11/17/2015  . Protein-calorie malnutrition, severe 11/17/2015  . Hypomagnesemia 09/08/2015  . Hypokalemia 09/07/2015  . Tobacco use disorder 09/07/2015  . Cocaine abuse ([place]) 05/27/2015  . Anemia 01/29/2013  . Nausea vomiting and diarrhea 01/26/2013  . Esophagitis 01/26/2013  . ANXIETY 03/01/2008  . Alcohol abuse 03/01/2008  . Elevated LFTs 03/01/2008   Past Medical History:  Diagnosis Date  . Allergy   . Anemia   . Calcification of aorta (HCC)   . Fatty liver   . GERD (gastroesophageal reflux disease)   . Headache   . Hypertension   .  Hypothyroidism   . Tuberculosis    as baby   Past Surgical History:  Procedure Laterality Date  . BACK SURGERY     x 2  . COLONOSCOPY N/A 03/13/2013   Procedure: COLONOSCOPY;  Surgeon: Leighton Ruff, MD;  Location: [facility];  Service: Endoscopy;  Laterality: N/A;  . LAPAROSCOPIC APPENDECTOMY     Allergies  Allergen Reactions  . Codeine Nausea And Vomiting  . Flexeril [Cyclobenzaprine] Itching  . Sulfa Antibiotics     Pt does not recall what the  reaction was,been too long  . Synthroid [Levothyroxine] Rash   Prior to Admission medications   Medication Sig Start Date End Date Taking? Authorizing Provider  Acetaminophen (TYLENOL PO) Take 1-2 tablets by mouth daily as needed (pain).   Yes [provider]  betamethasone dipropionate 0.05 % cream APPLY TO AFFECTED AREA(S) TWO TIMES A DAY 10/12/19  Yes Maximiano Coss, NP  feeding supplement (BOOST / RESOURCE BREEZE) LIQD Take 1 Container by mouth 2 (two) times daily between meals. 11/19/15  Yes Rama, Venetia Maxon, MD  ferrous gluconate (FERGON) 240 (27 FE) MG tablet Take 240 mg by mouth 2 (two) times daily.    Yes [provider]  ibuprofen (ADVIL,MOTRIN) 200 MG tablet Take 200-400 mg by mouth every 6 (six) hours as needed for moderate pain.   Yes [provider]  ketoconazole (NIZORAL) 2 % cream APPLY TO AFFECTED AREA(S) ONCE DAILY 10/12/19  Yes Maximiano Coss, NP  Pedialyte (PEDIALYTE) SOLN Take by mouth.   Yes [provider]  promethazine (PHENERGAN) 25 MG suppository Place 1 suppository (25 mg total) rectally every 8 (eight) hours as needed for nausea or vomiting. 02/11/19  Yes Little, Wenda Overland, MD  Tetrahydrozoline HCl (VISINE OP) Apply 1 drop to eye daily as needed (dry eyes).   Yes [provider]  thiamine 100 MG tablet Take 1 tablet (100 mg total) by mouth daily. 04/27/15  Yes Eulogio Bear U, DO  naproxen (NAPROSYN) 500 MG tablet Take 1 tablet (500 mg total) by mouth 2 (two) times daily with a meal. Patient not taking: Reported on 02/11/2019 12/21/16   Emeterio Reeve, DO  ondansetron (ZOFRAN ODT) 8 MG disintegrating tablet Take 1 tablet (8 mg total) by mouth every 8 (eight) hours as needed for nausea or vomiting. Patient not taking: Reported on 09/18/2019 06/22/16   Street, [place], PA-C  ranitidine (ZANTAC) 150 MG tablet Take 1 tablet (150 mg total) by mouth 2 (two) times daily. Patient not taking: Reported on 09/05/2016 06/22/16    Street, [place], PA-C   Social History   Socioeconomic History  . Marital status: Married    Spouse name: [name]  . Number of children: 0  . Years of education: College  . Highest education level: Not on file  Occupational History  . Occupation: retired Teaching laboratory technician work  Tobacco Use  . Smoking status: Current Every Day Smoker    Packs/day: 0.20    Types: Cigarettes  . Smokeless tobacco: Never Used  . Tobacco comment: 3 cigarettes per day  Substance and Sexual Activity  . Alcohol use: Yes    Alcohol/week: 10.0 standard drinks    Types: 10 Glasses of wine per week    Comment: working to reduce this due to illness  . Drug use: Yes    Types: Cocaine    Comment: rarely   . Sexual activity: Not Currently  Other Topics Concern  . Not on file  Social History Narrative   Lives with her husband.  No children.   Social Determinants of Health   Financial Resource Strain:   . Difficulty of Paying Living Expenses: Not on file  Food Insecurity:   . Worried About Charity fundraiser in the Last Year: Not on file  . Ran Out of Food in the Last Year: Not on file  Transportation Needs:   . Lack of Transportation (Medical): Not on file  . Lack of Transportation (Non-Medical): Not on file  Physical Activity:   . Days of Exercise per Week: Not on file  . Minutes of Exercise per Session: Not on file  Stress:   . Feeling of Stress : Not on file  Social Connections:   . Frequency of Communication with Friends and Family: Not on file  . Frequency of Social Gatherings with Friends and Family: Not on file  . Attends Religious Services: Not on file  . Active Member of Clubs or Organizations: Not on file  . Attends Archivist Meetings: Not on file  . Marital Status: Not on file  Intimate Partner Violence:   . Fear of Current or Ex-Partner: Not on file  . Emotionally Abused: Not on file  . Physically Abused: Not on file  . Sexually Abused: Not on file     Observations/Objective: Vitals:   12/31/19 1252  Weight: 110 lb (49.9 kg)  Height: 5\' 2"  (1.575 m)  Speaking in full sentences without apparent distress on phone.  Temp 98 earlier today.  Appropriate responses with understanding of plan expressed.   Assessment and Plan: Non-intractable vomiting with nausea, unspecified vomiting type  Alcohol use  -Isolated emesis without diarrhea, fever, abdominal pain, cough, or other infectious symptoms.  No known sick contacts.  History of alcohol abuse, use,.  Previous notes reviewed as above.  Denies current abdominal pain, and symptoms have improved today.  Some weakness/fatigue but no syncopal/near syncopal symptoms.  Tolerating small sips of fluids at home.  -Continue oral rehydration therapy with small sips of fluids frequently, understanding expressed.  Low-dose Zofran prescribed temporarily but cautioned on constipation risk, should use stool softener if recent constipation especially.  -If she is not continuing to improve tonight or into tomorrow morning, any recurrence of vomiting, abdominal pain, or other worsening symptoms, plan for emergency room evaluation.  Understanding of plan expressed.  -Stressed importance of follow-up with her gastroenterologist.   Follow Up Instructions: ER if not improving.    I discussed the assessment and treatment plan with the patient. The patient was provided an opportunity to ask questions and all were answered. The patient agreed with the plan and demonstrated an understanding of the instructions.   The patient was advised to call back or seek an in-person evaluation if the symptoms worsen or if the condition fails to improve as anticipated.  I provided 38 (28 phone) minutes of non-face-to-face time during this encounter.   Wendie Agreste, MD

## 2020-02-05 ENCOUNTER — Ambulatory Visit: Payer: BC Managed Care – PPO | Attending: Internal Medicine

## 2020-02-05 DIAGNOSIS — Z23 Encounter for immunization: Secondary | ICD-10-CM

## 2020-02-05 NOTE — Progress Notes (Signed)
   Covid-19 Vaccination Clinic  Name:  KRISTIN DAISEY    MRN: [id number] DOB: 12/29/1960  02/05/2020  Ms. Merrifield was observed post Covid-19 immunization for 15 minutes without incident. She was provided with Vaccine Information Sheet and instruction to access the V-Safe system.   Ms. Boga was instructed to call 911 with any severe reactions post vaccine: Marland Kitchen Difficulty breathing  . Swelling of face and throat  . A fast heartbeat  . A bad rash all over body  . Dizziness and weakness   Immunizations Administered    Name Date Dose VIS Date Route   Pfizer COVID-19 Vaccine 02/05/2020 10:18 AM 0.3 mL 10/30/2019 Intramuscular   Manufacturer: [place]   Lot: [id number]   [place]: [id number]

## 2020-03-01 ENCOUNTER — Ambulatory Visit: Payer: BC Managed Care – PPO | Attending: Internal Medicine

## 2020-03-01 DIAGNOSIS — Z23 Encounter for immunization: Secondary | ICD-10-CM

## 2020-03-01 NOTE — Progress Notes (Signed)
   Covid-19 Vaccination Clinic  Name:  Holly Phelps    MRN: [id number] DOB: 1961/02/28  03/01/2020  [name] was observed post Covid-19 immunization for 15 minutes without incident. She was provided with Vaccine Information Sheet and instruction to access the V-Safe system.   Holly Phelps was instructed to call 911 with any severe reactions post vaccine: Marland Kitchen Difficulty breathing  . Swelling of face and throat  . A fast heartbeat  . A bad rash all over body  . Dizziness and weakness   Immunizations Administered    Name Date Dose VIS Date Route   Pfizer COVID-19 Vaccine 03/01/2020 11:03 AM 0.3 mL 10/30/2019 Intramuscular   Manufacturer: [place]   Lot: [id number]   [place]: [id number]

## 2020-05-24 ENCOUNTER — Encounter: Payer: Self-pay | Admitting: Registered Nurse

## 2020-05-24 ENCOUNTER — Ambulatory Visit (INDEPENDENT_AMBULATORY_CARE_PROVIDER_SITE_OTHER): Payer: BC Managed Care – PPO | Admitting: Registered Nurse

## 2020-05-24 ENCOUNTER — Other Ambulatory Visit: Payer: Self-pay

## 2020-05-24 VITALS — BP 163/92 | HR 114 | Temp 97.6°F | Resp 18 | Ht 62.0 in | Wt 112.0 lb

## 2020-05-24 DIAGNOSIS — J029 Acute pharyngitis, unspecified: Secondary | ICD-10-CM

## 2020-05-24 MED ORDER — AMOXICILLIN-POT CLAVULANATE 875-125 MG PO TABS: 1.0000 | ORAL_TABLET | Freq: Two times a day (BID) | ORAL | 0 refills | Status: DC

## 2020-05-24 NOTE — Patient Instructions (Signed)
° ° ° °  If you have lab work done today you will be contacted with your lab results within the next 2 weeks.  If you have not heard from us then please contact us. The fastest way to get your results is to register for My Chart. ° ° °IF you received an x-ray today, you will receive an invoice from [facility]. Please contact [facility] at [phone] with questions or concerns regarding your invoice.  ° °IF you received labwork today, you will receive an invoice from [facility]. Please contact [facility] at [phone] with questions or concerns regarding your invoice.  ° °Our billing staff will not be able to assist you with questions regarding bills from these companies. ° °You will be contacted with the lab results as soon as they are available. The fastest way to get your results is to activate your My Chart account. Instructions are located on the last page of this paperwork. If you have not heard from us regarding the results in 2 weeks, please contact this office. °  ° ° ° °

## 2020-05-24 NOTE — Progress Notes (Signed)
Acute Office Visit  Subjective:    Patient ID: Holly Phelps, female    DOB: 1961-05-01, 59 y.o.   MRN: [id number]  Chief Complaint  Patient presents with  . Cyst    patient states that three days ago she noticed she had a knot under her right ear it was so tender to touch that it mad the back of her neck hurt. Per patient it went away but she now have one under the right ear, Also would like to discuss the cramps she has been getting in her right hand.    HPI Patient is in today for swollen lymph node  Noted that the r lymph node was swollen and tender for a few days. As this improved, the L side became inflamed. Feels a tickle in throat - some pain with swallowing, no pain breathing, no sinus congestion. No trouble breathing or coughing. No nvd, fevers, chills, fatigue. No sick contacts that she is aware of.   Past Medical History:  Diagnosis Date  . Allergy   . Anemia   . Calcification of aorta (HCC)   . Fatty liver   . GERD (gastroesophageal reflux disease)   . Headache   . Hypertension   . Hypothyroidism   . 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 BACK SURGERY     x 2  . COLONOSCOPY N/A 03/13/2013   Procedure: COLONOSCOPY;  Surgeon: Leighton Ruff, MD;  Location: [facility];  Service: Endoscopy;  Laterality: N/A;  . LAPAROSCOPIC APPENDECTOMY      Family History  Problem Relation Age of Onset  . Stroke Father   . Stroke Brother   . Stroke Brother     Social History   Socioeconomic History  . Marital status: Married    Spouse name: [name]  . Number of children: 0  . Years of education: College  . Highest education level: Not on file  Occupational History  . Occupation: retired Teaching laboratory technician work  Tobacco Use  . Smoking status: Current Every Day Smoker    Packs/day: 0.20    Types: Cigarettes  . Smokeless tobacco: Never Used  . Tobacco comment: 3 cigarettes per day  Substance and Sexual Activity  . Alcohol use:  Yes    Alcohol/week: 10.0 standard drinks    Types: 10 Glasses of wine per week    Comment: working to reduce this due to illness  . Drug use: Yes    Types: Cocaine    Comment: rarely   . Sexual activity: Not Currently  Other Topics Concern  . Not on file  Social History Narrative   Lives with her husband.   No children.   Social Determinants of Health   Financial Resource Strain:   . Difficulty of Paying Living Expenses:   Food Insecurity:   . Worried About Charity fundraiser in the Last Year:   . Arboriculturist in the Last Year:   Transportation Needs:   . Film/video editor (Medical):   Marland Kitchen Lack of Transportation (Non-Medical):   Physical Activity:   . Days of Exercise per Week:   . Minutes of Exercise per Session:   Stress:   . Feeling of Stress :   Social Connections:   . Frequency of Communication with Friends and Family:   . Frequency of Social Gatherings with Friends and Family:   . Attends Religious Services:   . Active Member of Clubs  or Organizations:   . Attends Archivist Meetings:   Marland Kitchen Marital Status:   Intimate Partner Violence:   . Fear of Current or Ex-Partner:   . Emotionally Abused:   Marland Kitchen Physically Abused:   . Sexually Abused:     Outpatient Medications Prior to Visit  Medication Sig Dispense Refill  . folic acid (FOLVITE) 1 MG tablet Take 1 mg by mouth daily.    Marland Kitchen ibuprofen (ADVIL,MOTRIN) 200 MG tablet Take 200-400 mg by mouth every 6 (six) hours as needed for moderate pain.    . Multiple Vitamins-Minerals (MULTIVITAMIN ADULTS PO) Take by mouth.    . Pedialyte (PEDIALYTE) SOLN Take by mouth.    . thiamine 100 MG tablet Take 1 tablet (100 mg total) by mouth daily. 30 tablet 0  . vitamin B-12 (CYANOCOBALAMIN) 500 MCG tablet Take 500 mcg by mouth daily.    . Acetaminophen (TYLENOL PO) Take 1-2 tablets by mouth daily as needed (pain). (Patient not taking: Reported on 05/24/2020)    . betamethasone dipropionate 0.05 % cream APPLY TO  AFFECTED AREA(S) TWO TIMES A DAY (Patient not taking: Reported on 05/24/2020) 30 g 0  . feeding supplement (BOOST / RESOURCE BREEZE) LIQD Take 1 Container by mouth 2 (two) times daily between meals. (Patient not taking: Reported on 05/24/2020) 14 Container 11  . ferrous gluconate (FERGON) 240 (27 FE) MG tablet Take 240 mg by mouth 2 (two) times daily.  (Patient not taking: Reported on 05/24/2020)    . ketoconazole (NIZORAL) 2 % cream APPLY TO AFFECTED AREA(S) ONCE DAILY (Patient not taking: Reported on 05/24/2020) 15 g 0  . naproxen (NAPROSYN) 500 MG tablet Take 1 tablet (500 mg total) by mouth 2 (two) times daily with a meal. (Patient not taking: Reported on 02/11/2019) 30 tablet 0  . ondansetron (ZOFRAN ODT) 4 MG disintegrating tablet Take 1 tablet (4 mg total) by mouth every 8 (eight) hours as needed for nausea or vomiting. (Patient not taking: Reported on 05/24/2020) 10 tablet 0  . promethazine (PHENERGAN) 25 MG suppository Place 1 suppository (25 mg total) rectally every 8 (eight) hours as needed for nausea or vomiting. (Patient not taking: Reported on 05/24/2020) 4 each 0  . ranitidine (ZANTAC) 150 MG tablet Take 1 tablet (150 mg total) by mouth 2 (two) times daily. (Patient not taking: Reported on 09/05/2016) 30 tablet 0  . Tetrahydrozoline HCl (VISINE OP) Apply 1 drop to eye daily as needed (dry eyes). (Patient not taking: Reported on 05/24/2020)     No facility-administered medications prior to visit.    Allergies  Allergen Reactions  . Codeine Nausea And Vomiting  . Flexeril [Cyclobenzaprine] Itching  . Sulfa Antibiotics     Pt does not recall what the reaction was,been too long  . Synthroid [Levothyroxine] Rash    Review of Systems Per hpi, otherwise negative.     Objective:    Physical Exam HENT:     Nose: Nose normal. No congestion or rhinorrhea.     Mouth/Throat:     Lips: Pink. No lesions.     Mouth: Mucous membranes are moist. No injury, lacerations, oral lesions or angioedema.      Tongue: No lesions. Tongue does not deviate from midline.     Palate: No mass and lesions.     Pharynx: Pharyngeal swelling and posterior oropharyngeal erythema present. No oropharyngeal exudate or uvula swelling.     Tonsils: No tonsillar exudate or tonsillar abscesses. 3+ on the right. 3+ on the left.  Eyes:     Conjunctiva/sclera: Conjunctivae normal.     Pupils: Pupils are equal, round, and reactive to light.  Neck:     Thyroid: No thyroid mass, thyromegaly or thyroid tenderness.  Musculoskeletal:     Cervical back: Full passive range of motion without pain, normal range of motion and neck supple. No edema, erythema, signs of trauma, rigidity, torticollis or crepitus. No pain with movement. Normal range of motion.  Lymphadenopathy:     Cervical: Cervical adenopathy present.     Right cervical: Superficial cervical adenopathy present. No deep or posterior cervical adenopathy.    Left cervical: Superficial cervical adenopathy present. No deep or posterior cervical adenopathy.  Skin:    General: Skin is warm.  Neurological:     General: No focal deficit present.     Mental Status: She is oriented to person, place, and time. Mental status is at baseline.     Cranial Nerves: No cranial nerve deficit.  Psychiatric:        Mood and Affect: Mood normal.        Behavior: Behavior normal.        Thought Content: Thought content normal.        Judgment: Judgment normal.     BP (!) 163/92   Pulse (!) 114   Temp 97.6 F (36.4 C) (Temporal)   Resp 18   Ht 5\' 2"  (1.575 m)   Wt 112 lb (50.8 kg)   SpO2 97%   BMI 20.49 kg/m  Wt Readings from Last 3 Encounters:  05/24/20 112 lb (50.8 kg)  12/31/19 110 lb (49.9 kg)  09/18/19 110 lb (49.9 kg)    There are no preventive care reminders to display for this patient.  There are no preventive care reminders to display for this patient.   Lab Results  Component Value Date   TSH 5.782 (H) 03/13/2015   Lab Results  Component Value Date     WBC 9.5 02/11/2019   HGB 13.6 02/11/2019   HCT 39.6 02/11/2019   MCV 104.2 (H) 02/11/2019   PLT 305 02/11/2019   Lab Results  Component Value Date   NA 137 02/11/2019   K 3.1 (L) 02/11/2019   CO2 22 02/11/2019   GLUCOSE 78 02/11/2019   BUN 13 02/11/2019   CREATININE 0.86 02/11/2019   BILITOT 1.5 (H) 02/11/2019   ALKPHOS 147 (H) 02/11/2019   AST 174 (H) 02/11/2019   ALT 88 (H) 02/11/2019   PROT 9.4 (H) 02/11/2019   ALBUMIN 5.3 (H) 02/11/2019   CALCIUM 10.1 02/11/2019   ANIONGAP 16 (H) 02/11/2019   Lab Results  Component Value Date   CHOL 228 (H) 01/27/2013   Lab Results  Component Value Date   HDL 102 01/27/2013   Lab Results  Component Value Date   LDLCALC 117 (H) 01/27/2013   Lab Results  Component Value Date   TRIG 47 01/27/2013   Lab Results  Component Value Date   CHOLHDL 2.2 01/27/2013   Lab Results  Component Value Date   HGBA1C 4.6 11/16/2015       Assessment & Plan:   Problem List Items Addressed This Visit    None    Visit Diagnoses    Acute pharyngitis, unspecified etiology    -  Primary   Relevant Medications   amoxicillin-clavulanate (AUGMENTIN) 875-125 MG tablet       Meds ordered this encounter  Medications  . amoxicillin-clavulanate (AUGMENTIN) 875-125 MG tablet    Sig: Take 1  tablet by mouth 2 (two) times daily.    Dispense:  14 tablet    Refill:  0    Order Specific Question:   Supervising Provider    Answer:   Rutherford Guys [[id number]]   PLAN  Over 1 week of swollen tonsils, sore lymph nodes, dysphagia  Lymph nodes tender and enlarged but mobile - definitely not fixed  Coincidental finding of hairy black tongue  augmentin po bid for 7 days  Return precautions reviewed  Patient encouraged to call clinic with any questions, comments, or concerns.  Maximiano Coss, NP

## 2020-05-30 ENCOUNTER — Encounter: Payer: Self-pay | Admitting: Registered Nurse

## 2020-05-30 ENCOUNTER — Other Ambulatory Visit: Payer: Self-pay

## 2020-05-30 ENCOUNTER — Ambulatory Visit (INDEPENDENT_AMBULATORY_CARE_PROVIDER_SITE_OTHER): Payer: BC Managed Care – PPO | Admitting: Registered Nurse

## 2020-05-30 VITALS — BP 149/94 | HR 117 | Temp 97.6°F | Resp 18 | Ht 62.0 in | Wt 111.4 lb

## 2020-05-30 DIAGNOSIS — R202 Paresthesia of skin: Secondary | ICD-10-CM | POA: Diagnosis not present

## 2020-05-30 NOTE — Progress Notes (Signed)
Acute Office Visit  Subjective:    Patient ID: Holly Phelps, female    DOB: Oct 18, 1961, 59 y.o.   MRN: [id number]  Chief Complaint  Patient presents with  . Cyst    Patient states she was here on 05/24/2020 for a knot on the right side of her face and was given Augmentin. Per patient she still have the knot and yesterday she felt the most pain yesterday.    HPI Patient is in today for ongoing pain  Pain in throat and near tonsillar lymph node. Feels swollen and tender to touch. Soft foods okay but some foods hard to swallow. Feels that pain is starting to radiate down jaw, perhaps towards ear. Having some pressure in ear. No headaches, visual changes, fevers, neck pain, spinal pain, or other associated symptoms. Mostly R side effected but left side has been too At last visit exam notable for erythema, 3+ swollen tonsils, and black hair tongue in mouth. Notable for lymphadenopathy of tonsillar lymph nodes, ttp, mobile.  Otherwise, she notes that she is having an ongoing paresthesia in her R hand. L hand not affected. Feels that her hand cramps up at times. Taking OTC Vit D supplement, B12 supplement, and multivitamin. Has not happened before. No history of carpal tunnel or tendonitis in that wrist or hand. No radiation towards elbow.  Past Medical History:  Diagnosis Date  . Allergy   . Anemia   . Calcification of aorta (HCC)   . Fatty liver   . GERD (gastroesophageal reflux disease)   . Headache   . Hypertension   . Hypothyroidism   . 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 BACK SURGERY     x 2  . COLONOSCOPY N/A 03/13/2013   Procedure: COLONOSCOPY;  Surgeon: Leighton Ruff, MD;  Location: [facility];  Service: Endoscopy;  Laterality: N/A;  . LAPAROSCOPIC APPENDECTOMY      Family History  Problem Relation Age of Onset  . Stroke Father   . Stroke Brother   . Stroke Brother     Social History   Socioeconomic History  .  Marital status: Married    Spouse name: [name]  . Number of children: 0  . Years of education: College  . Highest education level: Not on file  Occupational History  . Occupation: retired Teaching laboratory technician work  Tobacco Use  . Smoking status: Current Every Day Smoker    Packs/day: 0.20    Types: Cigarettes  . Smokeless tobacco: Never Used  . Tobacco comment: 3 cigarettes per day  Substance and Sexual Activity  . Alcohol use: Yes    Alcohol/week: 10.0 standard drinks    Types: 10 Glasses of wine per week    Comment: working to reduce this due to illness  . Drug use: Yes    Types: Cocaine    Comment: rarely   . Sexual activity: Not Currently  Other Topics Concern  . Not on file  Social History Narrative   Lives with her husband.   No children.   Social Determinants of Health   Financial Resource Strain:   . Difficulty of Paying Living Expenses:   Food Insecurity:   . Worried About Charity fundraiser in the Last Year:   . Arboriculturist in the Last Year:   Transportation Needs:   . Film/video editor (Medical):   Marland Kitchen Lack of Transportation (Non-Medical):   Physical Activity:   .  Days of Exercise per Week:   . Minutes of Exercise per Session:   Stress:   . Feeling of Stress :   Social Connections:   . Frequency of Communication with Friends and Family:   . Frequency of Social Gatherings with Friends and Family:   . Attends Religious Services:   . Active Member of Clubs or Organizations:   . Attends Archivist Meetings:   Marland Kitchen Marital Status:   Intimate Partner Violence:   . Fear of Current or Ex-Partner:   . Emotionally Abused:   Marland Kitchen Physically Abused:   . Sexually Abused:     Outpatient Medications Prior to Visit  Medication Sig Dispense Refill  . ferrous gluconate (FERGON) 240 (27 FE) MG tablet Take 240 mg by mouth 2 (two) times daily.     . Multiple Vitamins-Minerals (MULTIVITAMIN ADULTS PO) Take by mouth.    . naproxen (NAPROSYN) 500 MG tablet Take 1  tablet (500 mg total) by mouth 2 (two) times daily with a meal. 30 tablet 0  . Pedialyte (PEDIALYTE) SOLN Take by mouth.    . Acetaminophen (TYLENOL PO) Take 1-2 tablets by mouth daily as needed (pain).  (Patient not taking: Reported on 05/30/2020)    . amoxicillin-clavulanate (AUGMENTIN) 875-125 MG tablet Take 1 tablet by mouth 2 (two) times daily. (Patient not taking: Reported on 05/30/2020) 14 tablet 0  . betamethasone dipropionate 0.05 % cream APPLY TO AFFECTED AREA(S) TWO TIMES A DAY (Patient not taking: Reported on 05/30/2020) 30 g 0  . feeding supplement (BOOST / RESOURCE BREEZE) LIQD Take 1 Container by mouth 2 (two) times daily between meals. (Patient not taking: Reported on 05/30/2020) 14 Container 11  . folic acid (FOLVITE) 1 MG tablet Take 1 mg by mouth daily. (Patient not taking: Reported on 05/30/2020)    . ibuprofen (ADVIL,MOTRIN) 200 MG tablet Take 200-400 mg by mouth every 6 (six) hours as needed for moderate pain. (Patient not taking: Reported on 05/30/2020)    . ketoconazole (NIZORAL) 2 % cream APPLY TO AFFECTED AREA(S) ONCE DAILY (Patient not taking: Reported on 05/30/2020) 15 g 0  . ondansetron (ZOFRAN ODT) 4 MG disintegrating tablet Take 1 tablet (4 mg total) by mouth every 8 (eight) hours as needed for nausea or vomiting. (Patient not taking: Reported on 05/30/2020) 10 tablet 0  . promethazine (PHENERGAN) 25 MG suppository Place 1 suppository (25 mg total) rectally every 8 (eight) hours as needed for nausea or vomiting. (Patient not taking: Reported on 05/30/2020) 4 each 0  . ranitidine (ZANTAC) 150 MG tablet Take 1 tablet (150 mg total) by mouth 2 (two) times daily. (Patient not taking: Reported on 05/30/2020) 30 tablet 0  . Tetrahydrozoline HCl (VISINE OP) Apply 1 drop to eye daily as needed (dry eyes).  (Patient not taking: Reported on 05/30/2020)    . thiamine 100 MG tablet Take 1 tablet (100 mg total) by mouth daily. (Patient not taking: Reported on 05/30/2020) 30 tablet 0  . vitamin  B-12 (CYANOCOBALAMIN) 500 MCG tablet Take 500 mcg by mouth daily. (Patient not taking: Reported on 05/30/2020)     No facility-administered medications prior to visit.    Allergies  Allergen Reactions  . Codeine Nausea And Vomiting  . Flexeril [Cyclobenzaprine] Itching  . Sulfa Antibiotics     Pt does not recall what the reaction was,been too long  . Synthroid [Levothyroxine] Rash    Review of Systems Per hpi      Objective:    Physical Exam Vitals  and nursing note reviewed.  Constitutional:      Appearance: Normal appearance.  Cardiovascular:     Rate and Rhythm: Normal rate and regular rhythm.     Pulses: Normal pulses.     Heart sounds: No murmur heard.   Musculoskeletal:        General: Normal range of motion.     Cervical back: Normal range of motion and neck supple.  Lymphadenopathy:     Cervical: Cervical adenopathy present.  Skin:    General: Skin is warm and dry.  Neurological:     General: No focal deficit present.     Mental Status: She is oriented to person, place, and time. Mental status is at baseline.     Cranial Nerves: No cranial nerve deficit.     Sensory: No sensory deficit.     Motor: No weakness.  Psychiatric:        Mood and Affect: Mood normal.        Behavior: Behavior normal.        Thought Content: Thought content normal.        Judgment: Judgment normal.     BP (!) 149/94   Pulse (!) 117   Temp 97.6 F (36.4 C) (Temporal)   Resp 18   Ht 5\' 2"  (1.575 m)   Wt 111 lb 6.4 oz (50.5 kg)   SpO2 97%   BMI 20.38 kg/m  Wt Readings from Last 3 Encounters:  05/30/20 111 lb 6.4 oz (50.5 kg)  05/24/20 112 lb (50.8 kg)  12/31/19 110 lb (49.9 kg)    There are no preventive care reminders to display for this patient.  There are no preventive care reminders to display for this patient.   Lab Results  Component Value Date   TSH 5.782 (H) 03/13/2015   Lab Results  Component Value Date   WBC 9.5 02/11/2019   HGB 13.6 02/11/2019   HCT  39.6 02/11/2019   MCV 104.2 (H) 02/11/2019   PLT 305 02/11/2019   Lab Results  Component Value Date   NA 137 02/11/2019   K 3.1 (L) 02/11/2019   CO2 22 02/11/2019   GLUCOSE 78 02/11/2019   BUN 13 02/11/2019   CREATININE 0.86 02/11/2019   BILITOT 1.5 (H) 02/11/2019   ALKPHOS 147 (H) 02/11/2019   AST 174 (H) 02/11/2019   ALT 88 (H) 02/11/2019   PROT 9.4 (H) 02/11/2019   ALBUMIN 5.3 (H) 02/11/2019   CALCIUM 10.1 02/11/2019   ANIONGAP 16 (H) 02/11/2019   Lab Results  Component Value Date   CHOL 228 (H) 01/27/2013   Lab Results  Component Value Date   HDL 102 01/27/2013   Lab Results  Component Value Date   LDLCALC 117 (H) 01/27/2013   Lab Results  Component Value Date   TRIG 47 01/27/2013   Lab Results  Component Value Date   CHOLHDL 2.2 01/27/2013   Lab Results  Component Value Date   HGBA1C 4.6 11/16/2015       Assessment & Plan:   Problem List Items Addressed This Visit    None    Visit Diagnoses    Paresthesia    -  Primary   Relevant Orders   CBC   Comprehensive metabolic panel   Vitamin D, 25-hydroxy   Vitamin B12   Magnesium       No orders of the defined types were placed in this encounter.  PLAN  Cyst appears to be resolving. Continue supportive care. Will  draw labs to check for any other potential cause of lymphadenopathy  Labs collected to investigate paresthesia. Will follow up as warranted  Return if symptoms worsen or fail to improve  Patient encouraged to call clinic with any questions, comments, or concerns.   Maximiano Coss, NP

## 2020-05-30 NOTE — Patient Instructions (Signed)
° ° ° °  If you have lab work done today you will be contacted with your lab results within the next 2 weeks.  If you have not heard from us then please contact us. The fastest way to get your results is to register for My Chart. ° ° °IF you received an x-ray today, you will receive an invoice from [facility]. Please contact  [facility] at [phone] with questions or concerns regarding your invoice.  ° °IF you received labwork today, you will receive an invoice from [facility]. Please contact [facility] at [phone] with questions or concerns regarding your invoice.  ° °Our billing staff will not be able to assist you with questions regarding bills from these companies. ° °You will be contacted with the lab results as soon as they are available. The fastest way to get your results is to activate your My Chart account. Instructions are located on the last page of this paperwork. If you have not heard from us regarding the results in 2 weeks, please contact this office. °  ° ° ° °

## 2020-05-31 LAB — COMPREHENSIVE METABOLIC PANEL
ALT: 76 IU/L — ABNORMAL HIGH (ref 0–32)
AST: 241 IU/L — ABNORMAL HIGH (ref 0–40)
Albumin/Globulin Ratio: 1.5 (ref 1.2–2.2)
Albumin: 4.8 g/dL (ref 3.8–4.9)
Alkaline Phosphatase: 199 IU/L — ABNORMAL HIGH (ref 48–121)
BUN/Creatinine Ratio: 8 — ABNORMAL LOW (ref 9–23)
BUN: 5 mg/dL — ABNORMAL LOW (ref 6–24)
Bilirubin Total: 0.4 mg/dL (ref 0.0–1.2)
CO2: 22 mmol/L (ref 20–29)
Calcium: 9.6 mg/dL (ref 8.7–10.2)
Chloride: 92 mmol/L — ABNORMAL LOW (ref 96–106)
Creatinine, Ser: 0.6 mg/dL (ref 0.57–1.00)
GFR calc Af Amer: 115 mL/min/{1.73_m2} (ref >59)
GFR calc non Af Amer: 100 mL/min/{1.73_m2} (ref >59)
Globulin, Total: 3.1 g/dL (ref 1.5–4.5)
Glucose: 81 mg/dL (ref 65–99)
Potassium: 3.5 mmol/L (ref 3.5–5.2)
Sodium: 140 mmol/L (ref 134–144)
Total Protein: 7.9 g/dL (ref 6.0–8.5)

## 2020-05-31 LAB — CBC
Hematocrit: 35.4 % (ref 34.0–46.6)
Hemoglobin: 13 g/dL (ref 11.1–15.9)
MCH: 36.8 pg — ABNORMAL HIGH (ref 26.6–33.0)
MCHC: 36.7 g/dL — ABNORMAL HIGH (ref 31.5–35.7)
MCV: 100 fL — ABNORMAL HIGH (ref 79–97)
Platelets: 309 10*3/uL (ref 150–450)
RBC: 3.53 x10E6/uL — ABNORMAL LOW (ref 3.77–5.28)
RDW: 12.1 % (ref 11.7–15.4)
WBC: 7.9 10*3/uL (ref 3.4–10.8)

## 2020-05-31 LAB — VITAMIN B12: Vitamin B-12: >2000 pg/mL — ABNORMAL HIGH (ref 232–1245)

## 2020-05-31 LAB — MAGNESIUM: Magnesium: 1.4 mg/dL — ABNORMAL LOW (ref 1.6–2.3)

## 2020-05-31 LAB — VITAMIN D 25 HYDROXY (VIT D DEFICIENCY, FRACTURES): Vit D, 25-Hydroxy: 37.7 ng/mL (ref 30.0–100.0)

## 2020-06-02 ENCOUNTER — Other Ambulatory Visit: Payer: Self-pay | Admitting: Registered Nurse

## 2020-06-02 DIAGNOSIS — R748 Abnormal levels of other serum enzymes: Secondary | ICD-10-CM

## 2020-06-02 DIAGNOSIS — R599 Enlarged lymph nodes, unspecified: Secondary | ICD-10-CM

## 2020-06-02 NOTE — Progress Notes (Signed)
Called patient to discuss labs CBC shows a mild macrocytic anemia b12 is elevated >2000. Will refer to hematology for further work up  Pt demonstrates understanding  Kathrin Ruddy, NP

## 2020-08-10 ENCOUNTER — Telehealth: Payer: Self-pay | Admitting: *Deleted

## 2020-08-10 NOTE — Telephone Encounter (Signed)
Schedule mammogram-mobile unit 9-27

## 2020-08-15 ENCOUNTER — Encounter: Payer: Self-pay | Admitting: Registered Nurse

## 2020-10-25 ENCOUNTER — Ambulatory Visit (INDEPENDENT_AMBULATORY_CARE_PROVIDER_SITE_OTHER): Payer: BC Managed Care – PPO | Admitting: Registered Nurse

## 2020-10-25 ENCOUNTER — Encounter: Payer: Self-pay | Admitting: Registered Nurse

## 2020-10-25 ENCOUNTER — Other Ambulatory Visit: Payer: Self-pay

## 2020-10-25 ENCOUNTER — Telehealth: Payer: Self-pay | Admitting: Registered Nurse

## 2020-10-25 VITALS — BP 181/92 | HR 114 | Temp 98.0°F | Resp 18 | Ht 62.0 in | Wt 101.6 lb

## 2020-10-25 DIAGNOSIS — M25531 Pain in right wrist: Secondary | ICD-10-CM | POA: Diagnosis not present

## 2020-10-25 DIAGNOSIS — M2550 Pain in unspecified joint: Secondary | ICD-10-CM | POA: Diagnosis not present

## 2020-10-25 MED ORDER — INDOMETHACIN 50 MG PO CAPS: 50.0000 mg | ORAL_CAPSULE | Freq: Three times a day (TID) | ORAL | 0 refills | Status: DC

## 2020-10-25 MED ORDER — TRAMADOL HCL 50 MG PO TABS: 50.0000 mg | ORAL_TABLET | Freq: Three times a day (TID) | ORAL | 0 refills | Status: AC | PRN

## 2020-10-25 NOTE — Patient Instructions (Signed)
° ° ° °  If you have lab work done today you will be contacted with your lab results within the next 2 weeks.  If you have not heard from us then please contact us. The fastest way to get your results is to register for My Chart. ° ° °IF you received an x-ray today, you will receive an invoice from [facility]. Please contact [facility] at [phone] with questions or concerns regarding your invoice.  ° °IF you received labwork today, you will receive an invoice from [facility]. Please contact [facility] at [phone] with questions or concerns regarding your invoice.  ° °Our billing staff will not be able to assist you with questions regarding bills from these companies. ° °You will be contacted with the lab results as soon as they are available. The fastest way to get your results is to activate your My Chart account. Instructions are located on the last page of this paperwork. If you have not heard from us regarding the results in 2 weeks, please contact this office. °  ° ° ° °

## 2020-10-25 NOTE — Progress Notes (Signed)
Acute Office Visit  Subjective:    Patient ID: Holly Phelps, female    DOB: [birth year], 59 y.o.   MRN: [id number]  Chief Complaint  Patient presents with  . Hand Pain    patient states she woke up 2 days ago and had some right hand an wrist pain. Per patient she cant even write with a pencil or get out of bed without help. per patient her wrist seems to be swollen at this time.    HPI Patient is in today for acute pain and swelling in R wrist  Onset 2 days ago - woke up in pain. Has been worsening. Now red and swelling on dorsal aspect. Some warmth. Radiates towards fingers, not proximal at all. Limited rom in wrist dt pain, fingers have less than full rom she believes from the pain Notes no acute injury. This has not happened before. Only wrist affected - no other joint. Has not tried anything for relief. Notes she has been starting the process of a move recently, though she has not yet done much packing.  Past Medical History:  Diagnosis Date  . Allergy   . Anemia   . Calcification of aorta (HCC)   . Fatty liver   . GERD (gastroesophageal reflux disease)   . Headache   . Hypertension   . Hypothyroidism   . 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 BACK SURGERY     x 2  . COLONOSCOPY N/A 03/13/2013   Procedure: COLONOSCOPY;  Surgeon: Leighton Ruff, MD;  Location: [facility];  Service: Endoscopy;  Laterality: N/A;  . LAPAROSCOPIC APPENDECTOMY      Family History  Problem Relation Age of Onset  . Stroke Father   . Stroke Brother   . Stroke Brother     Social History   Socioeconomic History  . Marital status: Married    Spouse name: [name]  . Number of children: 0  . Years of education: College  . Highest education level: Not on file  Occupational History  . Occupation: retired Teaching laboratory technician work  Tobacco Use  . Smoking status: Current Every Day Smoker    Packs/day: 0.20    Types: Cigarettes  . Smokeless tobacco:  Never Used  . Tobacco comment: 3 cigarettes per day  Substance and Sexual Activity  . Alcohol use: Yes    Alcohol/week: 10.0 standard drinks    Types: 10 Glasses of wine per week    Comment: working to reduce this due to illness  . Drug use: Yes    Types: Cocaine    Comment: rarely   . Sexual activity: Not Currently  Other Topics Concern  . Not on file  Social History Narrative   Lives with her husband.   No children.   Social Determinants of Health   Financial Resource Strain:   . Difficulty of Paying Living Expenses: Not on file  Food Insecurity:   . Worried About Charity fundraiser in the Last Year: Not on file  . Ran Out of Food in the Last Year: Not on file  Transportation Needs:   . Lack of Transportation (Medical): Not on file  . Lack of Transportation (Non-Medical): Not on file  Physical Activity:   . Days of Exercise per Week: Not on file  . Minutes of Exercise per Session: Not on file  Stress:   . Feeling of Stress : Not on file  Social Connections:   .  Frequency of Communication with Friends and Family: Not on file  . Frequency of Social Gatherings with Friends and Family: Not on file  . Attends Religious Services: Not on file  . Active Member of Clubs or Organizations: Not on file  . Attends Archivist Meetings: Not on file  . Marital Status: Not on file  Intimate Partner Violence:   . Fear of Current or Ex-Partner: Not on file  . Emotionally Abused: Not on file  . Physically Abused: Not on file  . Sexually Abused: Not on file    Outpatient Medications Prior to Visit  Medication Sig Dispense Refill  . ferrous gluconate (FERGON) 240 (27 FE) MG tablet Take 240 mg by mouth 2 (two) times daily.     . Multiple Vitamins-Minerals (MULTIVITAMIN ADULTS PO) Take by mouth.    . naproxen (NAPROSYN) 500 MG tablet Take 1 tablet (500 mg total) by mouth 2 (two) times daily with a meal. 30 tablet 0  . Acetaminophen (TYLENOL PO) Take 1-2 tablets by mouth daily  as needed (pain).  (Patient not taking: Reported on 05/30/2020)    . amoxicillin-clavulanate (AUGMENTIN) 875-125 MG tablet Take 1 tablet by mouth 2 (two) times daily. (Patient not taking: Reported on 05/30/2020) 14 tablet 0  . betamethasone dipropionate 0.05 % cream APPLY TO AFFECTED AREA(S) TWO TIMES A DAY (Patient not taking: Reported on 05/30/2020) 30 g 0  . feeding supplement (BOOST / RESOURCE BREEZE) LIQD Take 1 Container by mouth 2 (two) times daily between meals. (Patient not taking: Reported on 05/30/2020) 14 Container 11  . folic acid (FOLVITE) 1 MG tablet Take 1 mg by mouth daily. (Patient not taking: Reported on 05/30/2020)    . ibuprofen (ADVIL,MOTRIN) 200 MG tablet Take 200-400 mg by mouth every 6 (six) hours as needed for moderate pain. (Patient not taking: Reported on 05/30/2020)    . ketoconazole (NIZORAL) 2 % cream APPLY TO AFFECTED AREA(S) ONCE DAILY (Patient not taking: Reported on 05/30/2020) 15 g 0  . ondansetron (ZOFRAN ODT) 4 MG disintegrating tablet Take 1 tablet (4 mg total) by mouth every 8 (eight) hours as needed for nausea or vomiting. (Patient not taking: Reported on 05/30/2020) 10 tablet 0  . Pedialyte (PEDIALYTE) SOLN Take by mouth. (Patient not taking: Reported on 10/25/2020)    . promethazine (PHENERGAN) 25 MG suppository Place 1 suppository (25 mg total) rectally every 8 (eight) hours as needed for nausea or vomiting. (Patient not taking: Reported on 05/30/2020) 4 each 0  . ranitidine (ZANTAC) 150 MG tablet Take 1 tablet (150 mg total) by mouth 2 (two) times daily. (Patient not taking: Reported on 05/30/2020) 30 tablet 0  . Tetrahydrozoline HCl (VISINE OP) Apply 1 drop to eye daily as needed (dry eyes).  (Patient not taking: Reported on 05/30/2020)    . thiamine 100 MG tablet Take 1 tablet (100 mg total) by mouth daily. (Patient not taking: Reported on 05/30/2020) 30 tablet 0  . vitamin B-12 (CYANOCOBALAMIN) 500 MCG tablet Take 500 mcg by mouth daily. (Patient not taking: Reported  on 05/30/2020)     No facility-administered medications prior to visit.    Allergies  Allergen Reactions  . Codeine Nausea And Vomiting  . Flexeril [Cyclobenzaprine] Itching  . Sulfa Antibiotics     Pt does not recall what the reaction was,been too long  . Synthroid [Levothyroxine] Rash    Review of Systems Per hpi      Objective:    Physical Exam Vitals and nursing note reviewed.  Constitutional:      Appearance: Normal appearance.  Cardiovascular:     Rate and Rhythm: Normal rate and regular rhythm.  Musculoskeletal:        General: Swelling and tenderness present. No deformity or signs of injury.     Right wrist: Swelling, tenderness and bony tenderness present. No snuff box tenderness. Decreased range of motion. Normal pulse.     Left wrist: Normal.     Right lower leg: No edema.     Left lower leg: No edema.  Skin:    General: Skin is warm and dry.     Capillary Refill: Capillary refill takes less than 2 seconds.     Coloration: Skin is not jaundiced or pale.     Findings: Erythema present. No bruising, lesion or rash.  Neurological:     General: No focal deficit present.     Mental Status: She is alert and oriented to person, place, and time. Mental status is at baseline.  Psychiatric:        Mood and Affect: Mood normal.        Behavior: Behavior normal.        Thought Content: Thought content normal.        Judgment: Judgment normal.     BP (!) 181/92   Pulse (!) 114   Temp 98 F (36.7 C) (Temporal)   Resp 18   Ht 5\' 2"  (1.575 m)   Wt 101 lb 9.6 oz (46.1 kg)   SpO2 97%   BMI 18.58 kg/m  Wt Readings from Last 3 Encounters:  10/25/20 101 lb 9.6 oz (46.1 kg)  05/30/20 111 lb 6.4 oz (50.5 kg)  05/24/20 112 lb (50.8 kg)    There are no preventive care reminders to display for this patient.  There are no preventive care reminders to display for this patient.   Lab Results  Component Value Date   TSH 5.782 (H) 03/13/2015   Lab Results    Component Value Date   WBC 7.9 05/30/2020   HGB 13.0 05/30/2020   HCT 35.4 05/30/2020   MCV 100 (H) 05/30/2020   PLT 309 05/30/2020   Lab Results  Component Value Date   NA 140 05/30/2020   K 3.5 05/30/2020   CO2 22 05/30/2020   GLUCOSE 81 05/30/2020   BUN 5 (L) 05/30/2020   CREATININE 0.60 05/30/2020   BILITOT 0.4 05/30/2020   ALKPHOS 199 (H) 05/30/2020   AST 241 (H) 05/30/2020   ALT 76 (H) 05/30/2020   PROT 7.9 05/30/2020   ALBUMIN 4.8 05/30/2020   CALCIUM 9.6 05/30/2020   ANIONGAP 16 (H) 02/11/2019   Lab Results  Component Value Date   CHOL 228 (H) 01/27/2013   Lab Results  Component Value Date   HDL 102 01/27/2013   Lab Results  Component Value Date   LDLCALC 117 (H) 01/27/2013   Lab Results  Component Value Date   TRIG 47 01/27/2013   Lab Results  Component Value Date   CHOLHDL 2.2 01/27/2013   Lab Results  Component Value Date   HGBA1C 4.6 11/16/2015       Assessment & Plan:   Problem List Items Addressed This Visit    None    Visit Diagnoses    Right wrist pain    -  Primary   Relevant Medications   indomethacin (INDOCIN) 50 MG capsule   traMADol (ULTRAM) 50 MG tablet   Other Relevant Orders   CBC With Differential  Comprehensive metabolic panel   TSH   Uric Acid   Vitamin B12   C-reactive protein   Sedimentation Rate   Acute joint pain       Relevant Medications   indomethacin (INDOCIN) 50 MG capsule   traMADol (ULTRAM) 50 MG tablet   Other Relevant Orders   CBC With Differential   Comprehensive metabolic panel   TSH   Uric Acid   Vitamin B12   C-reactive protein   Sedimentation Rate       Meds ordered this encounter  Medications  . indomethacin (INDOCIN) 50 MG capsule    Sig: Take 1 capsule (50 mg total) by mouth 3 (three) times daily with meals.    Dispense:  21 capsule    Refill:  0    Order Specific Question:   Supervising Provider    Answer:   Carlota Raspberry, JEFFREY R [[id number]]  . traMADol (ULTRAM) 50 MG tablet    Sig:  Take 1 tablet (50 mg total) by mouth every 8 (eight) hours as needed for up to 5 days.    Dispense:  7 tablet    Refill:  0    Order Specific Question:   Supervising Provider    Answer:   Carlota Raspberry, JEFFREY R [[id number]]   PLAN  Unclear etiology.   No wound, low suspicion for septic joint  Favor gouty arthritis.  Indomethacin 50mg  PO tid. Hold naprosyn while taking this.   Tramadol 50mg  PO qhs PRN - discussed past intolerance of codeine and risks of giving this medication. Pt voices understanding and agrees to proceed.  Based on lab results will treat for specific etiology or, if no improvement in coming days, will refer to hand specialist  Patient encouraged to call clinic with any questions, comments, or concerns.   Maximiano Coss, NP

## 2020-10-25 NOTE — Telephone Encounter (Signed)
Patient's husband called to check on status of prescription. Not called in to pharmacy yet. I spoke with Norton Blizzard. She will make sure it's called in. Please advise at [phone].

## 2020-10-26 ENCOUNTER — Other Ambulatory Visit: Payer: Self-pay | Admitting: Registered Nurse

## 2020-10-26 DIAGNOSIS — M109 Gout, unspecified: Secondary | ICD-10-CM

## 2020-10-26 DIAGNOSIS — E790 Hyperuricemia without signs of inflammatory arthritis and tophaceous disease: Secondary | ICD-10-CM

## 2020-10-26 LAB — CBC WITH DIFFERENTIAL
Basophils Absolute: 0.1 10*3/uL (ref 0.0–0.2)
Basos: 1 %
EOS (ABSOLUTE): 0.1 10*3/uL (ref 0.0–0.4)
Eos: 1 %
Hematocrit: 36.8 % (ref 34.0–46.6)
Hemoglobin: 12.4 g/dL (ref 11.1–15.9)
Immature Grans (Abs): 0 10*3/uL (ref 0.0–0.1)
Immature Granulocytes: 0 %
Lymphocytes Absolute: 1 10*3/uL (ref 0.7–3.1)
Lymphs: 11 %
MCH: 33.3 pg — ABNORMAL HIGH (ref 26.6–33.0)
MCHC: 33.7 g/dL (ref 31.5–35.7)
MCV: 99 fL — ABNORMAL HIGH (ref 79–97)
Monocytes Absolute: 0.8 10*3/uL (ref 0.1–0.9)
Monocytes: 9 %
Neutrophils Absolute: 7.5 10*3/uL — ABNORMAL HIGH (ref 1.4–7.0)
Neutrophils: 78 %
RBC: 3.72 x10E6/uL — ABNORMAL LOW (ref 3.77–5.28)
RDW: 11.7 % (ref 11.7–15.4)
WBC: 9.6 10*3/uL (ref 3.4–10.8)

## 2020-10-26 LAB — TSH: TSH: 4.98 u[IU]/mL — ABNORMAL HIGH (ref 0.450–4.500)

## 2020-10-26 LAB — COMPREHENSIVE METABOLIC PANEL
ALT: 21 IU/L (ref 0–32)
AST: 47 IU/L — ABNORMAL HIGH (ref 0–40)
Albumin/Globulin Ratio: 1.6 (ref 1.2–2.2)
Albumin: 5.1 g/dL — ABNORMAL HIGH (ref 3.8–4.9)
Alkaline Phosphatase: 181 IU/L — ABNORMAL HIGH (ref 44–121)
BUN/Creatinine Ratio: 11 (ref 9–23)
BUN: 8 mg/dL (ref 6–24)
Bilirubin Total: 0.6 mg/dL (ref 0.0–1.2)
CO2: 17 mmol/L — ABNORMAL LOW (ref 20–29)
Calcium: 9.9 mg/dL (ref 8.7–10.2)
Chloride: 91 mmol/L — ABNORMAL LOW (ref 96–106)
Creatinine, Ser: 0.74 mg/dL (ref 0.57–1.00)
GFR calc Af Amer: 103 mL/min/{1.73_m2} (ref >59)
GFR calc non Af Amer: 89 mL/min/{1.73_m2} (ref >59)
Globulin, Total: 3.1 g/dL (ref 1.5–4.5)
Glucose: 93 mg/dL (ref 65–99)
Potassium: 3.6 mmol/L (ref 3.5–5.2)
Sodium: 138 mmol/L (ref 134–144)
Total Protein: 8.2 g/dL (ref 6.0–8.5)

## 2020-10-26 LAB — URIC ACID: Uric Acid: 9.5 mg/dL — ABNORMAL HIGH (ref 3.0–7.2)

## 2020-10-26 LAB — VITAMIN B12: Vitamin B-12: 1506 pg/mL — ABNORMAL HIGH (ref 232–1245)

## 2020-10-26 LAB — SEDIMENTATION RATE: Sed Rate: 73 mm/hr — ABNORMAL HIGH (ref 0–40)

## 2020-10-26 LAB — C-REACTIVE PROTEIN: CRP: 77 mg/L — ABNORMAL HIGH (ref 0–10)

## 2020-10-26 MED ORDER — ALLOPURINOL 100 MG PO TABS: 100.0000 mg | ORAL_TABLET | Freq: Every day | ORAL | 3 refills | Status: AC

## 2020-10-26 NOTE — Progress Notes (Signed)
Hello, If we could call Holly Phelps - her uric acid is elevated - I think this is gout. The indomethacin should help, but she should start the allopurinol 100mg  PO qd I have sent after this flare reduces. This will help lower her uric acid. I want to check her levels again in around 3 months.  Thank you  Rich

## 2021-04-29 ENCOUNTER — Emergency Department (HOSPITAL_COMMUNITY)
Admission: EM | Admit: 2021-04-29 | Discharge: 2021-04-29 | Disposition: A | Discharge status: Home / Self Care | Payer: 59 | Attending: Emergency Medicine | Admitting: Emergency Medicine

## 2021-04-29 ENCOUNTER — Other Ambulatory Visit: Payer: Self-pay

## 2021-04-29 ENCOUNTER — Emergency Department (HOSPITAL_COMMUNITY): Payer: 59

## 2021-04-29 ENCOUNTER — Encounter (HOSPITAL_COMMUNITY): Payer: Self-pay | Admitting: Emergency Medicine

## 2021-04-29 DIAGNOSIS — F17210 Nicotine dependence, cigarettes, uncomplicated: Secondary | ICD-10-CM | POA: Diagnosis not present

## 2021-04-29 DIAGNOSIS — R112 Nausea with vomiting, unspecified: Secondary | ICD-10-CM

## 2021-04-29 DIAGNOSIS — R630 Anorexia: Secondary | ICD-10-CM | POA: Insufficient documentation

## 2021-04-29 DIAGNOSIS — K5900 Constipation, unspecified: Secondary | ICD-10-CM | POA: Insufficient documentation

## 2021-04-29 DIAGNOSIS — E039 Hypothyroidism, unspecified: Secondary | ICD-10-CM | POA: Diagnosis not present

## 2021-04-29 DIAGNOSIS — E876 Hypokalemia: Secondary | ICD-10-CM

## 2021-04-29 DIAGNOSIS — I10 Essential (primary) hypertension: Secondary | ICD-10-CM | POA: Diagnosis not present

## 2021-04-29 LAB — URINALYSIS, ROUTINE W REFLEX MICROSCOPIC
Bacteria, UA: NONE SEEN
Bilirubin Urine: NEGATIVE
Glucose, UA: NEGATIVE mg/dL
Hgb urine dipstick: NEGATIVE
Ketones, ur: 80 mg/dL — AB
Leukocytes,Ua: NEGATIVE
Nitrite: NEGATIVE
Protein, ur: NEGATIVE mg/dL
Specific Gravity, Urine: 1.015 (ref 1.005–1.030)
pH: 6 (ref 5.0–8.0)

## 2021-04-29 LAB — COMPREHENSIVE METABOLIC PANEL
ALT: 23 U/L (ref 0–44)
AST: 56 U/L — ABNORMAL HIGH (ref 15–41)
Albumin: 4.4 g/dL (ref 3.5–5.0)
Alkaline Phosphatase: 142 U/L — ABNORMAL HIGH (ref 38–126)
Anion gap: >20 — ABNORMAL HIGH (ref 5–15)
BUN: 7 mg/dL (ref 6–20)
CO2: 21 mmol/L — ABNORMAL LOW (ref 22–32)
Calcium: 10.1 mg/dL (ref 8.9–10.3)
Chloride: 89 mmol/L — ABNORMAL LOW (ref 98–111)
Creatinine, Ser: 0.68 mg/dL (ref 0.44–1.00)
GFR, Estimated: >60 mL/min (ref >60)
Glucose, Bld: 92 mg/dL (ref 70–99)
Potassium: 2.8 mmol/L — ABNORMAL LOW (ref 3.5–5.1)
Sodium: 134 mmol/L — ABNORMAL LOW (ref 135–145)
Total Bilirubin: 1 mg/dL (ref 0.3–1.2)
Total Protein: 8.2 g/dL — ABNORMAL HIGH (ref 6.5–8.1)

## 2021-04-29 LAB — CBC WITH DIFFERENTIAL/PLATELET
Abs Immature Granulocytes: 0.04 10*3/uL (ref 0.00–0.07)
Basophils Absolute: 0.1 10*3/uL (ref 0.0–0.1)
Basophils Relative: 1 %
Eosinophils Absolute: 0.1 10*3/uL (ref 0.0–0.5)
Eosinophils Relative: 1 %
HCT: 34.9 % — ABNORMAL LOW (ref 36.0–46.0)
Hemoglobin: 12.1 g/dL (ref 12.0–15.0)
Immature Granulocytes: 1 %
Lymphocytes Relative: 13 %
Lymphs Abs: 1 10*3/uL (ref 0.7–4.0)
MCH: 34 pg (ref 26.0–34.0)
MCHC: 34.7 g/dL (ref 30.0–36.0)
MCV: 98 fL (ref 80.0–100.0)
Monocytes Absolute: 1.2 10*3/uL — ABNORMAL HIGH (ref 0.1–1.0)
Monocytes Relative: 16 %
Neutro Abs: 5.2 10*3/uL (ref 1.7–7.7)
Neutrophils Relative %: 68 %
Platelets: 385 10*3/uL (ref 150–400)
RBC: 3.56 MIL/uL — ABNORMAL LOW (ref 3.87–5.11)
RDW: 12.7 % (ref 11.5–15.5)
WBC: 7.6 10*3/uL (ref 4.0–10.5)
nRBC: 0 % (ref 0.0–0.2)

## 2021-04-29 LAB — LIPASE, BLOOD: Lipase: 28 U/L (ref 11–51)

## 2021-04-29 IMAGING — CT CT ABD-PELV W/ CM
2 of 5 series · 15 of 46 positions shown, 17 images · IV contrast (omnipaque)
Comparison: CT scan [DATE]

CLINICAL DATA: Decreased appetite and constipation for 1 week.
Vomiting since yesterday.

EXAM:
CT ABDOMEN AND PELVIS WITH CONTRAST
TECHNIQUE: Multidetector CT imaging of the abdomen and pelvis was performed
using the standard protocol following bolus administration of
intravenous contrast.
CONTRAST:  75mL OMNIPAQUE IOHEXOL 300 MG/ML  SOLN

[Series 2: axial st · axial · 0.66mm/px · z∈[-433,-78]mm · 12 of 83 slices shown, 14 images]
[im 6/83  soft-tissue]
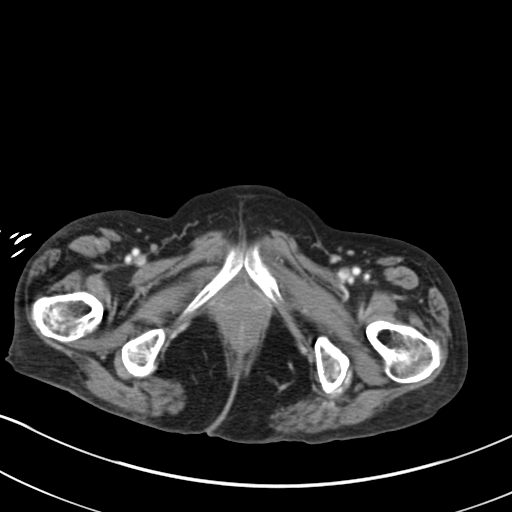
[im 6/83  bone]
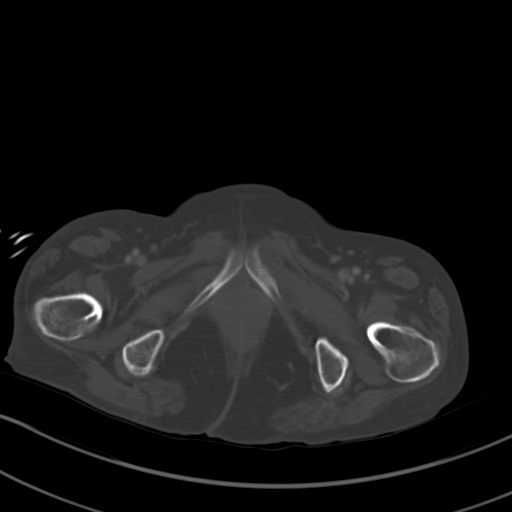
[im 11/83  soft-tissue]
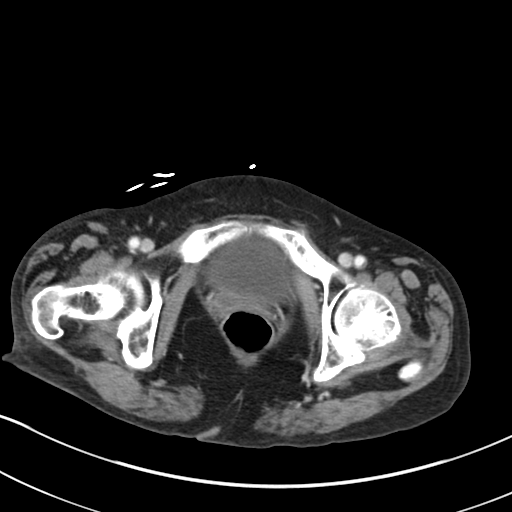
[im 21/83  soft-tissue]
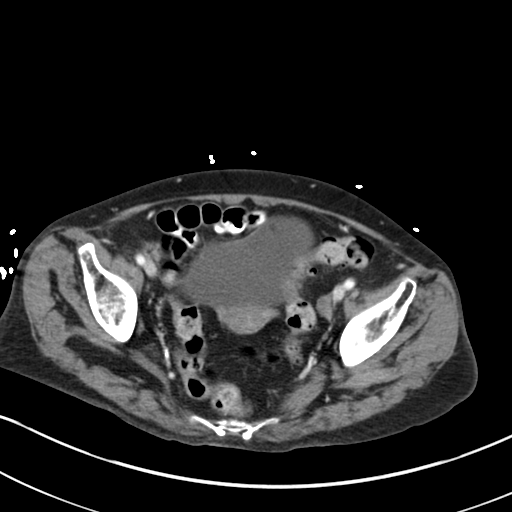
[im 26/83  soft-tissue]
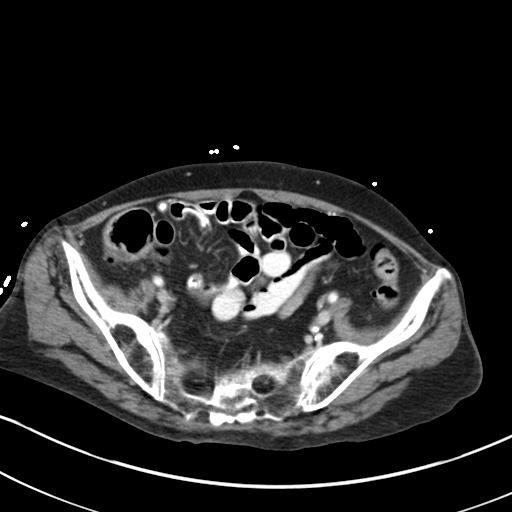
[im 31/83  soft-tissue]
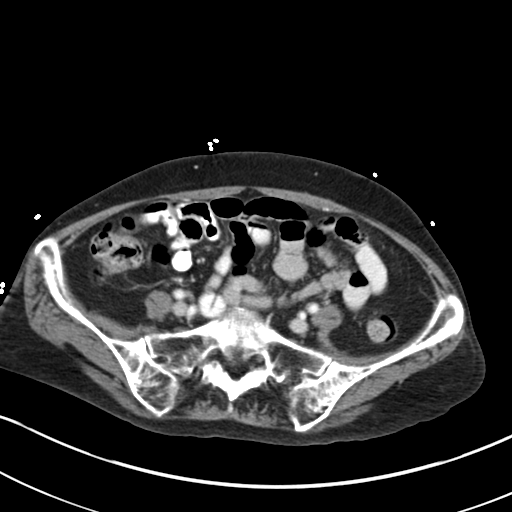
[im 36/83  soft-tissue]
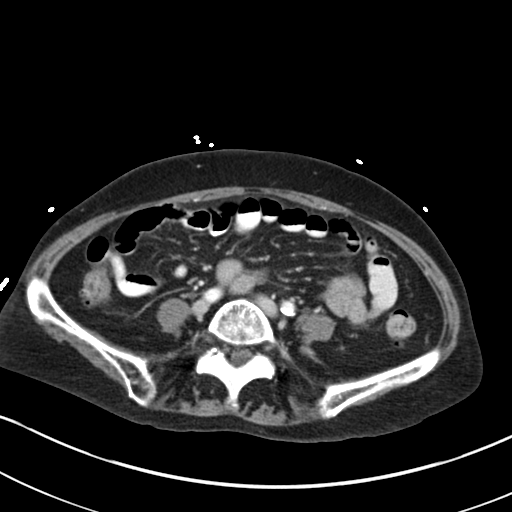
[im 47/83  soft-tissue]
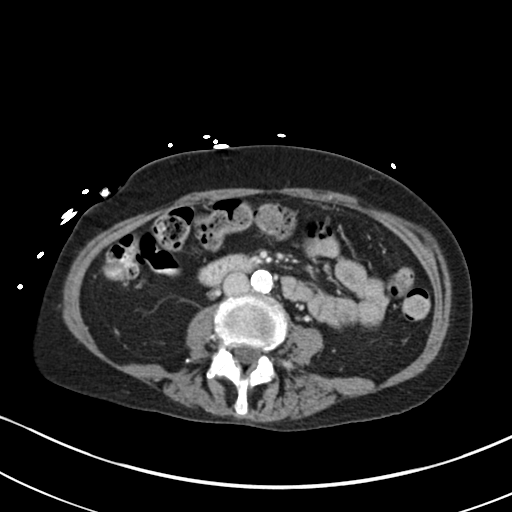
[im 52/83  soft-tissue]
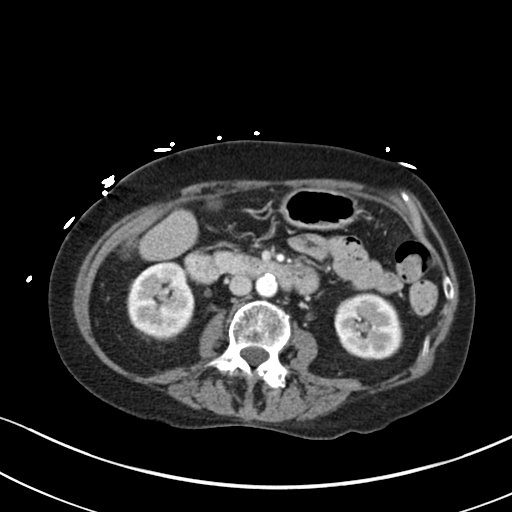
[im 57/83  soft-tissue]
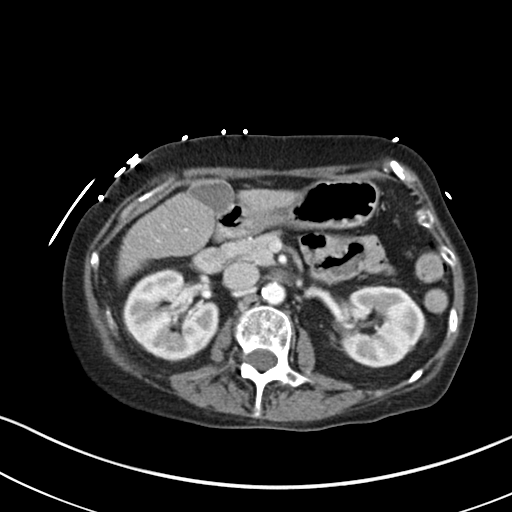
[im 57/83  bone]
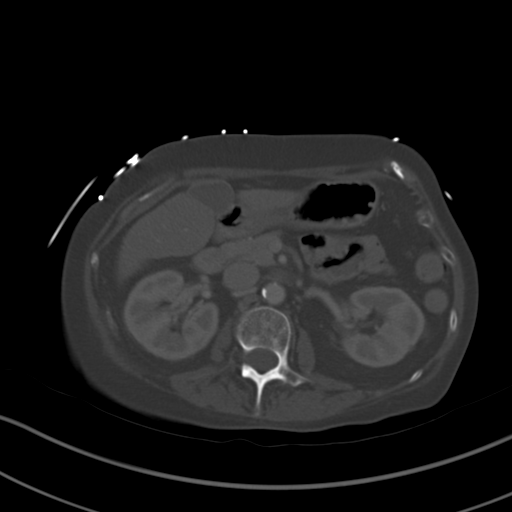
[im 62/83  soft-tissue]
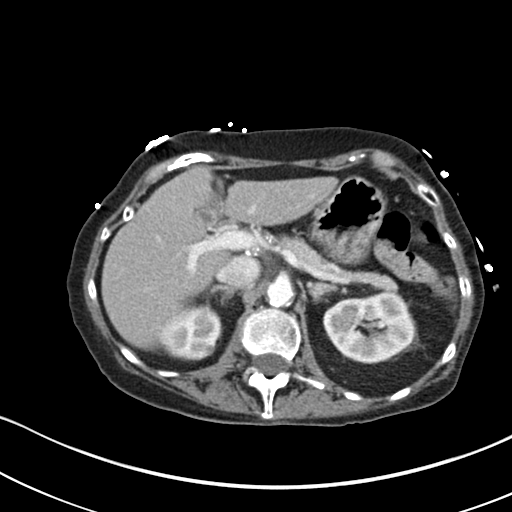
[im 72/83  soft-tissue]
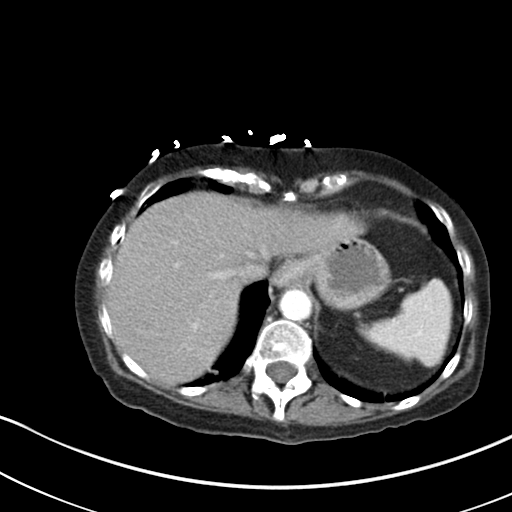
[im 77/83  soft-tissue]
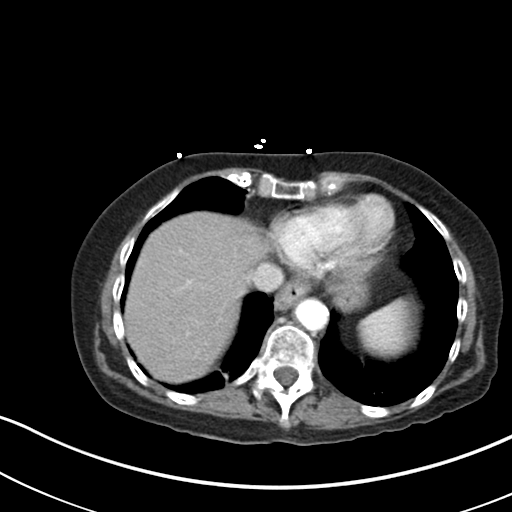

[Series 5: coronal st · coronal · 0.60mm/px · 3 of 109 slices shown]
[im 37/109  soft-tissue]
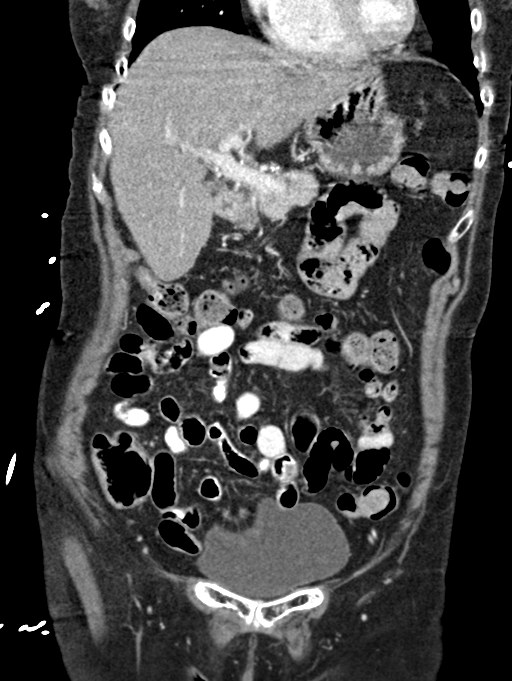
[im 49/109  soft-tissue]
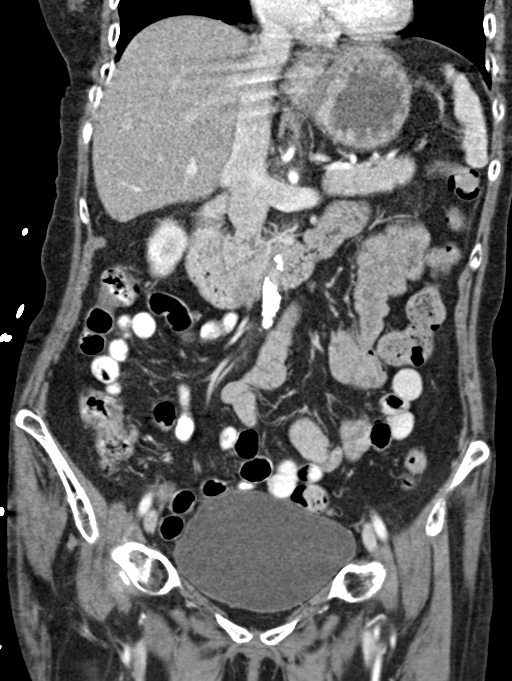
[im 61/109  soft-tissue]
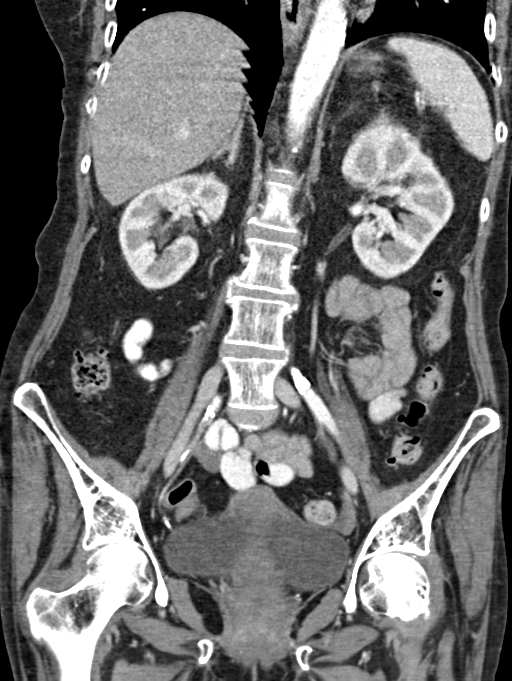

[15 of 46 positions shown; findings below may reference images not displayed]

FINDINGS: Lower chest: Streaky dependent basilar atelectasis but no
infiltrates or effusions. No worrisome pulmonary lesions. The heart
is normal in size. No pericardial effusion. The distal esophagus is
grossly normal. Age advanced aortic calcifications are noted.

Hepatobiliary: No hepatic lesions or intrahepatic biliary
dilatation. The gallbladder is unremarkable. No common bile duct
dilatation.

Pancreas: No mass, inflammation or ductal dilatation.

Spleen: Normal size.  No focal lesions.

Adrenals/Urinary Tract: Adrenal glands and kidneys are unremarkable.
No worrisome renal lesions or hydronephrosis. Bladder is
unremarkable.

Stomach/Bowel: The stomach, duodenum, small bowel and colon are
unremarkable. No acute inflammatory changes, mass lesions or
obstructive findings. The terminal ileum is normal. The appendix is
not identified but no findings for acute appendicitis. Scattered
colonic diverticulosis but no findings for acute diverticulitis.

Vascular/Lymphatic: Age advanced atherosclerotic calcifications
involving the aorta, iliac arteries and branch vessels. No focal
aneurysm. The major venous structures are patent. No mesenteric or
retroperitoneal mass or adenopathy.

Reproductive: The uterus and ovaries are unremarkable.

Other: No pelvic mass or adenopathy. No free pelvic fluid
collections. No inguinal mass or adenopathy. No abdominal wall
hernia or subcutaneous lesions.

Musculoskeletal: No significant bony findings. Age advanced
osteoporosis.
IMPRESSION: 1. No acute abdominal/pelvic findings, mass lesions or adenopathy.
2. Age advanced atherosclerotic calcifications involving the aorta,
iliac arteries and branch vessels.
3. Age advanced osteoporosis.

Aortic Atherosclerosis ([0W]-[0W]).

## 2021-04-29 MED ORDER — IOHEXOL 9 MG/ML PO SOLN
500.0000 mL | ORAL | Status: AC
  Administered 2021-04-29: 500 mL via ORAL

## 2021-04-29 MED ORDER — IOHEXOL 9 MG/ML PO SOLN
ORAL | Status: AC
  Filled 2021-04-29: qty 1000

## 2021-04-29 MED ORDER — POTASSIUM CHLORIDE 10 MEQ/100ML IV SOLN
10.0000 meq | INTRAVENOUS | Status: AC
  Administered 2021-04-29 (×3): 10 meq via INTRAVENOUS
  Filled 2021-04-29 (×3): qty 100

## 2021-04-29 MED ORDER — IOHEXOL 300 MG/ML  SOLN
75.0000 mL | Freq: Once | INTRAMUSCULAR | Status: AC | PRN
  Administered 2021-04-29: 75 mL via INTRAVENOUS

## 2021-04-29 MED ORDER — FAMOTIDINE 20 MG PO TABS: 20.0000 mg | ORAL_TABLET | Freq: Two times a day (BID) | ORAL | 0 refills | Status: DC

## 2021-04-29 MED ORDER — SODIUM CHLORIDE 0.9 % IV BOLUS
1000.0000 mL | Freq: Once | INTRAVENOUS | Status: AC
  Administered 2021-04-29: 1000 mL via INTRAVENOUS

## 2021-04-29 MED ORDER — SODIUM CHLORIDE (PF) 0.9 % IJ SOLN
INTRAMUSCULAR | Status: AC
  Filled 2021-04-29: qty 50

## 2021-04-29 MED ORDER — PROMETHAZINE HCL 25 MG RE SUPP: 25.0000 mg | Freq: Four times a day (QID) | RECTAL | 0 refills | Status: DC | PRN

## 2021-04-29 NOTE — ED Notes (Signed)
Patient said she is still not able to give a urine sample.

## 2021-04-29 NOTE — ED Notes (Signed)
Patient was informed we need a urine sample. She said she is not able to go at this time because she "has not had anything to drink".

## 2021-04-29 NOTE — Discharge Instructions (Addendum)
Please read and follow all provided instructions.  Your diagnoses today include:  1. Non-intractable vomiting with nausea, unspecified vomiting type   2. Decreased appetite   3. Hypokalemia     Tests performed today include: Blood cell counts and platelets Kidney and liver function tests Pancreas function test (called lipase) Urine test to look for infection A blood or urine test for pregnancy (women only) CT scan of your abdomen and pelvis - no acute problems seen Vital signs. See below for your results today.   Medications prescribed:  Phenergan (promethazine) - for nausea and vomiting  Pepcid (famotidine) - antihistamine  You can find this medication over-the-counter.   DO NOT exceed:  20mg  Pepcid every 12 hours  Take any prescribed medications only as directed.  Home care instructions:  Follow any educational materials contained in this packet.  Follow-up instructions: Please follow-up with your primary care provider in the next 2 days for further evaluation of your symptoms.    Return instructions:  SEEK IMMEDIATE MEDICAL ATTENTION IF: The pain does not go away or becomes severe  A temperature above 101F develops  Repeated vomiting occurs (multiple episodes)  The pain becomes localized to portions of the abdomen. The right side could possibly be appendicitis. In an adult, the left lower portion of the abdomen could be colitis or diverticulitis.  Blood is being passed in stools or vomit (bright red or black tarry stools)  You develop chest pain, difficulty breathing, dizziness or fainting, or become confused, poorly responsive, or inconsolable (young children) If you have any other emergent concerns regarding your health  Additional Information: Abdominal (belly) pain can be caused by many things. Your caregiver performed an examination and possibly ordered blood/urine tests and imaging (CT scan, x-rays, ultrasound). Many cases can be observed and treated at home  after initial evaluation in the emergency department. Even though you are being discharged home, abdominal pain can be unpredictable. Therefore, you need a repeated exam if your pain does not resolve, returns, or worsens. Most patients with abdominal pain don't have to be admitted to the hospital or have surgery, but serious problems like appendicitis and gallbladder attacks can start out as nonspecific pain. Many abdominal conditions cannot be diagnosed in one visit, so follow-up evaluations are very important.  Your vital signs today were: BP (!) 170/96 (BP Location: Right Arm)   Pulse 95   Temp 98.4 F (36.9 C) (Oral)   Resp 18   SpO2 100%  If your blood pressure (bp) was elevated above 135/85 this visit, please have this repeated by your doctor within one month. --------------

## 2021-04-29 NOTE — ED Provider Notes (Signed)
Emergency Medicine Provider Triage Evaluation Note  Holly Phelps , a 60 y.o. female  was evaluated in triage.  Pt complains of decreased appetite, constipation, nausea and vomiting.  Patient reports that her decreased appetite and constipation have been present over the last week.  Patient is able to pass flatus but has not had any bowel movements.  Patient reports that nausea and vomiting started yesterday.  Patient denies any coffee-ground emesis or hematemesis.  Patient reports history of appendectomy.  Review of Systems  Positive: Chills, nausea, vomiting, decreased appetite, constipation  Negative: Fever, abdominal pain, rectal bleeding, blood in stool, melena  Physical Exam  BP (!) 133/93 (BP Location: Right Arm)   Pulse (!) 116   Temp 98.4 F (36.9 C) (Oral)   Resp 20   SpO2 100%  Gen:   Awake, no distress   Resp:  Normal effort  MSK:   Moves extremities without difficulty  Other:  Abdomen soft, nondistended, nontender  Medical Decision Making  Medically screening exam initiated at 9:07 AM.  Appropriate orders placed.  Holly Phelps was informed that the remainder of the evaluation will be completed by another provider, this initial triage assessment does not replace that evaluation, and the importance of remaining in the ED until their evaluation is complete.  The patient appears stable so that the remainder of the work up may be completed by another provider.      Loni Beckwith, PA-C 04/29/21 [id number]    Daleen Bo, MD 04/29/21 [phone]

## 2021-04-29 NOTE — ED Notes (Signed)
Patient said she feels like she will be able to go to the bathroom in a little while to give a sample. She said she is still not able to go right now.

## 2021-04-29 NOTE — ED Provider Notes (Signed)
5:08 PM Signout from [employer] at shift change.   Pt with N/V, decreased appetite, constipation.  Was awaiting completion of IV potassium and CT scan to rule out obstruction or other intra-abdominal problems.  CT was negative.  I went and evaluated the patient.  Informed of reassuring CT results.  She states that she just wants to go home.  She was quite anxious about getting up and using the restroom immediately.  She has not yet provided a urine sample.  She ambulated independently to the restroom and states that she would try to give a urine sample but is not sure if she can.  Review of labs show a low potassium which she has had in the past, low chloride which she has also had.  Anion gap is 24, which is driven by her low chloride.  Minimally low bicarb of 21.  Elevated anion gap is also not unprecedented for her based on previous labs over the past several years.  5:48 PM urine looks concentrated, consistent with dehydration.  Vital signs are improving.  Patient again voices that she wants to go home.  Plan to discharged home with Phenergan, Pepcid.    Encouraged PCP follow-up in 48 hours for recheck.  The patient was urged to return to the Emergency Department immediately with worsening of current symptoms, worsening abdominal pain, persistent vomiting, blood noted in stools, fever, or any other concerns. The patient verbalized understanding.   BP (!) 170/96 (BP Location: Right Arm)   Pulse 95   Temp 98.4 F (36.9 C) (Oral)   Resp 18   SpO2 100%          Carlisle Cater, PA-C 04/29/21 1749    Blanchie Dessert, MD 04/30/21 1601

## 2021-04-29 NOTE — ED Provider Notes (Addendum)
[facility] DEPT Provider Note   CSN: [id number] Arrival date & time: 04/29/21  8185     History Chief Complaint  Patient presents with   Constipation   Emesis    Holly Phelps is a 60 y.o. female with a history of hypertension, hypothyroidism, GERD, anemia, status post appendectomy.  Patient presents emerged part with a chief complaint of decreased appetite, constipation, nausea and vomiting.  Patient reports that she has had decreased appetite over the last week.  Patient has had no desire to eat.  Patient has been able to drink oral fluids without difficulty.  Patient denies any trouble swallowing.  Patient reports that constipation has been present over the last week.  Patient has been able to pass flatus during this time.  Patient denies any diarrhea, melena, or blood in stool prior to her episodes of constipation.  Patient reports that nausea and vomiting started last night.  Patient reports that she has vomited approximately 10 times in the last 24 hours.  Patient reports that she is producing minimal emesis with each episode.  Patient describes emesis as bilious in nature.  Patient denies any hematemesis or coffee-ground emesis.  Patient endorses chills, generalized abdominal discomfort.  Patient denies any fevers, chest pain, shortness of breath, abdominal distention, dysuria, hematuria, flank pain, vaginal bleeding, vaginal discharge, vaginal pain.   Constipation Associated symptoms: vomiting   Associated symptoms: no abdominal pain, no back pain, no dysuria, no fever and no nausea   Emesis Associated symptoms: no abdominal pain, no chills, no cough, no fever, no headaches and no sore throat       Past Medical History:  Diagnosis Date   Allergy    Anemia    Calcification of aorta (HCC)    Fatty liver    GERD (gastroesophageal reflux disease)    Headache    Hypertension    Hypothyroidism    Tuberculosis    as baby    Patient Active  Problem List   Diagnosis Date Noted   Gouty arthritis 10/26/2020   Need for hepatitis B vaccination 01/12/2016   Calcification of aorta (HCC)    Recurrent Clostridium difficile diarrhea 11/18/2015   Macrocytic anemia 11/18/2015   Nausea and vomiting 11/18/2015   Leucocytosis 11/17/2015   Protein-calorie malnutrition, severe 11/17/2015   Hypomagnesemia 09/08/2015   Hypokalemia 09/07/2015   Tobacco use disorder 09/07/2015   Cocaine abuse ([place]) 05/27/2015   Anemia 01/29/2013   Nausea vomiting and diarrhea 01/26/2013   Esophagitis 01/26/2013   ANXIETY 03/01/2008   Alcohol abuse 03/01/2008   Elevated LFTs 03/01/2008    Past Surgical History:  Procedure Laterality Date   BACK SURGERY     x 2   COLONOSCOPY N/A 03/13/2013   Procedure: COLONOSCOPY;  Surgeon: Leighton Ruff, MD;  Location: [facility];  Service: Endoscopy;  Laterality: N/A;   LAPAROSCOPIC APPENDECTOMY       OB History   No obstetric history on file.     Family History  Problem Relation Age of Onset   Stroke Father    Stroke Brother    Stroke Brother     Social History   Tobacco Use   Smoking status: Every Day    Packs/day: 0.20    Pack years: 0.00    Types: Cigarettes   Smokeless tobacco: Never   Tobacco comments:    3 cigarettes per day  Substance Use Topics   Alcohol use: Yes    Alcohol/week: 10.0 standard drinks  Types: 10 Glasses of wine per week    Comment: working to reduce this due to illness   Drug use: Yes    Types: Cocaine    Comment: rarely     Home Medications Prior to Admission medications   Medication Sig Start Date End Date Taking? Authorizing Provider  Acetaminophen (TYLENOL PO) Take 1-2 tablets by mouth daily as needed (pain).  Patient not taking: Reported on 05/30/2020    [provider]  allopurinol (ZYLOPRIM) 100 MG tablet Take 1 tablet (100 mg total) by mouth daily. 10/26/20   Maximiano Coss, NP  amoxicillin-clavulanate (AUGMENTIN) 875-125 MG tablet Take 1  tablet by mouth 2 (two) times daily. Patient not taking: Reported on 05/30/2020 05/24/20   Maximiano Coss, NP  betamethasone dipropionate 0.05 % cream APPLY TO AFFECTED AREA(S) TWO TIMES A DAY Patient not taking: Reported on 05/30/2020 10/12/19   Maximiano Coss, NP  feeding supplement (BOOST / RESOURCE BREEZE) LIQD Take 1 Container by mouth 2 (two) times daily between meals. Patient not taking: Reported on 05/30/2020 11/19/15   Rama, Venetia Maxon, MD  ferrous gluconate (FERGON) 240 (27 FE) MG tablet Take 240 mg by mouth 2 (two) times daily.     [provider]  folic acid (FOLVITE) 1 MG tablet Take 1 mg by mouth daily. Patient not taking: Reported on 05/30/2020    [provider]  ibuprofen (ADVIL,MOTRIN) 200 MG tablet Take 200-400 mg by mouth every 6 (six) hours as needed for moderate pain. Patient not taking: Reported on 05/30/2020    [provider]  indomethacin (INDOCIN) 50 MG capsule Take 1 capsule (50 mg total) by mouth 3 (three) times daily with meals. 10/25/20   Maximiano Coss, NP  ketoconazole (NIZORAL) 2 % cream APPLY TO AFFECTED AREA(S) ONCE DAILY Patient not taking: Reported on 05/30/2020 10/12/19   Maximiano Coss, NP  Multiple Vitamins-Minerals (MULTIVITAMIN ADULTS PO) Take by mouth.    [provider]  naproxen (NAPROSYN) 500 MG tablet Take 1 tablet (500 mg total) by mouth 2 (two) times daily with a meal. 12/21/16   Emeterio Reeve, DO  ondansetron (ZOFRAN ODT) 4 MG disintegrating tablet Take 1 tablet (4 mg total) by mouth every 8 (eight) hours as needed for nausea or vomiting. Patient not taking: Reported on 05/30/2020 12/31/19   Wendie Agreste, MD  Pedialyte (PEDIALYTE) SOLN Take by mouth. Patient not taking: Reported on 10/25/2020    [provider]  promethazine (PHENERGAN) 25 MG suppository Place 1 suppository (25 mg total) rectally every 8 (eight) hours as needed for nausea or vomiting. Patient not taking: Reported on 05/30/2020 02/11/19    Little, Wenda Overland, MD  ranitidine (ZANTAC) 150 MG tablet Take 1 tablet (150 mg total) by mouth 2 (two) times daily. Patient not taking: Reported on 05/30/2020 06/22/16   Street, [place], PA-C  Tetrahydrozoline HCl (VISINE OP) Apply 1 drop to eye daily as needed (dry eyes).  Patient not taking: Reported on 05/30/2020    [provider]  thiamine 100 MG tablet Take 1 tablet (100 mg total) by mouth daily. Patient not taking: Reported on 05/30/2020 04/27/15   [name], DO  vitamin B-12 (CYANOCOBALAMIN) 500 MCG tablet Take 500 mcg by mouth daily. Patient not taking: Reported on 05/30/2020    [provider]    Allergies    Codeine, Flexeril [cyclobenzaprine], Sulfa antibiotics, and Synthroid [levothyroxine]  Review of Systems   Review of Systems  Constitutional:  Negative for chills and fever.  HENT:  Negative for congestion, rhinorrhea and sore throat.   Respiratory:  Negative for cough and shortness of breath.   Cardiovascular:  Negative for chest pain.  Gastrointestinal:  Positive for constipation and vomiting. Negative for abdominal pain and nausea.  Genitourinary:  Negative for difficulty urinating, dysuria, flank pain, frequency, vaginal bleeding, vaginal discharge and vaginal pain.  Musculoskeletal:  Negative for back pain and neck pain.  Skin:  Negative for color change and rash.  Neurological:  Negative for dizziness, syncope, light-headedness and headaches.  Psychiatric/Behavioral:  Negative for confusion.    Physical Exam Updated Vital Signs BP 135/86 (BP Location: Right Arm)   Pulse 96   Temp 97.9 F (36.6 C) (Oral)   Resp 20   SpO2 100%   Physical Exam Vitals and nursing note reviewed.  Constitutional:      General: She is not in acute distress.    Appearance: She is not ill-appearing, toxic-appearing or diaphoretic.  HENT:     Head: Normocephalic.  Eyes:     General: No scleral icterus.       Right eye: No discharge.        Left eye: No  discharge.  Cardiovascular:     Rate and Rhythm: Normal rate.  Pulmonary:     Effort: Pulmonary effort is normal. No tachypnea, bradypnea or respiratory distress.     Breath sounds: Normal breath sounds. No stridor.  Abdominal:     General: Abdomen is flat. Bowel sounds are increased. There is no distension. There are no signs of injury.     Palpations: Abdomen is soft. There is no mass or pulsatile mass.     Tenderness: There is generalized abdominal tenderness. There is no right CVA tenderness, left CVA tenderness, guarding or rebound.     Hernia: There is no hernia in the umbilical area or ventral area.     Comments: Mild generalized tenderness throughout entire abdomen  Musculoskeletal:     Cervical back: Neck supple.     Right lower leg: No swelling or tenderness. No edema.     Left lower leg: No swelling or tenderness. No edema.  Skin:    General: Skin is warm and dry.     Coloration: Skin is not cyanotic, jaundiced or pale.  Neurological:     General: No focal deficit present.     Mental Status: She is alert and oriented to person, place, and time.     GCS: GCS eye subscore is 4. GCS verbal subscore is 5. GCS motor subscore is 6.  Psychiatric:        Behavior: Behavior is cooperative.    ED Results / Procedures / Treatments   Labs (all labs ordered are listed, but only abnormal results are displayed) Labs Reviewed  CBC WITH DIFFERENTIAL/PLATELET - Abnormal; Notable for the following components:      Result Value   RBC 3.56 (*)    HCT 34.9 (*)    Monocytes Absolute 1.2 (*)    All other components within normal limits  COMPREHENSIVE METABOLIC PANEL - Abnormal; Notable for the following components:   Sodium 134 (*)    Potassium 2.8 (*)    Chloride 89 (*)    CO2 21 (*)    Total Protein 8.2 (*)    AST 56 (*)    Alkaline Phosphatase 142 (*)    Anion gap >20 (*)    All other components within normal limits  LIPASE, BLOOD  URINALYSIS, ROUTINE W REFLEX MICROSCOPIC  EKG None  Radiology No results found.  Procedures .Critical Care  Date/Time: 04/29/2021 3:43 PM Performed by: Loni Beckwith, PA-C Authorized by: Loni Beckwith, PA-C   Critical care provider statement:    Critical care time (minutes):  45   Critical care was necessary to treat or prevent imminent or life-threatening deterioration of the following conditions:  Metabolic crisis   Critical care was time spent personally by me on the following activities:  Discussions with consultants, examination of patient, ordering and performing treatments and interventions, ordering and review of laboratory studies, ordering and review of radiographic studies, pulse oximetry, re-evaluation of patient's condition, obtaining history from patient or surrogate and review of old charts   Medications Ordered in ED Medications  potassium chloride 10 mEq in 100 mL IVPB (10 mEq Intravenous New Bag/Given 04/29/21 1532)  iohexol (OMNIPAQUE) 9 MG/ML oral solution 500 mL (has no administration in time range)  iohexol (OMNIPAQUE) 9 MG/ML oral solution (has no administration in time range)  sodium chloride 0.9 % bolus 1,000 mL (1,000 mLs Intravenous New Bag/Given 04/29/21 1256)    ED Course  I have reviewed the triage vital signs and the nursing notes.  Pertinent labs & imaging results that were available during my care of the patient were reviewed by me and considered in my medical decision making (see chart for details).    MDM Rules/Calculators/A&P                           Alert 60 year old female no acute distress, nontoxic-appearing.  Patient presents to the emerged part with a chief complaint of decreased appetite, constipation, nausea, and vomiting.  Constipation and decreased appetite have been present x1 week.  Nausea and vomiting started last night.  Abdomen soft, nondistended, hyperactive bowel sounds.  Patient has mild generalized tenderness throughout her entire abdomen.  No  rebound tenderness, guarding, CVA tenderness.  CMP, CBC, lipase, urinalysis ordered while patient was in triage.  CBC is unremarkable. Lipase within normal limits, low suspicion for acute pancreatitis. CMP shows potassium 2.8.  This is likely due to decreased appetite as well as vomiting.  Will check EKG and start patient on IV potassium x3.   CMP also shows sodium decreased at 134, chloride decreased at 89, bicarb 21, increased anion gap.  Patient denies any frequent NSAID use, Tylenol use, aspirin use.  Denies drinking any alcohol recently.  Suspect that patient's increased anion gap may be due to dehydration.  Will give patient 1 L fluid bolus.    Will obtain CT abdomen pelvis to evaluate for possible small bowel obstruction due to patient is history of constipation, nausea vomiting and previous abdominal surgeries.  On serial reexamination patient abdomen remains soft, nondistended.  Patient continues to deny any nausea.  Patient able to tolerate drinking oral contrast dye.  If CT abdomen pelvis does not show any acute abnormality plan to discharge patient to follow-up with primary care provider for repeat lab work.    Patient care and treatment were discussed with attending physician Dr. Eulis Foster.  Patient care transferred to PA Geiple at the end of my shift. Patient presentation, ED course, and plan of care discussed with review of all pertinent labs and imaging. Please see his/her note for further details regarding further ED course and disposition.   Final Clinical Impression(s) / ED Diagnoses Final diagnoses:  None    Rx / DC Orders ED Discharge Orders     None  Loni Beckwith, PA-C 04/29/21 1555    Loni Beckwith, PA-C 04/29/21 1556    Daleen Bo, MD 04/29/21 1724

## 2021-04-29 NOTE — ED Notes (Signed)
Patient tried to provide a urine sample however she said she was unable to go. RN and I accompanied her to the restroom.

## 2021-04-29 NOTE — ED Triage Notes (Signed)
Patient c/o decreased appetite with constipation x1 week. States vomiting since yesterday.

## 2021-10-06 ENCOUNTER — Emergency Department (HOSPITAL_BASED_OUTPATIENT_CLINIC_OR_DEPARTMENT_OTHER): Payer: 59

## 2021-10-06 ENCOUNTER — Other Ambulatory Visit: Payer: Self-pay

## 2021-10-06 ENCOUNTER — Emergency Department (HOSPITAL_BASED_OUTPATIENT_CLINIC_OR_DEPARTMENT_OTHER)
Admission: EM | Admit: 2021-10-06 | Discharge: 2021-10-06 | Disposition: A | Discharge status: Home / Self Care | Payer: 59 | Attending: Emergency Medicine | Admitting: Emergency Medicine

## 2021-10-06 DIAGNOSIS — W19XXXA Unspecified fall, initial encounter: Secondary | ICD-10-CM

## 2021-10-06 DIAGNOSIS — F17210 Nicotine dependence, cigarettes, uncomplicated: Secondary | ICD-10-CM | POA: Diagnosis not present

## 2021-10-06 DIAGNOSIS — I10 Essential (primary) hypertension: Secondary | ICD-10-CM | POA: Insufficient documentation

## 2021-10-06 DIAGNOSIS — S0083XA Contusion of other part of head, initial encounter: Secondary | ICD-10-CM | POA: Insufficient documentation

## 2021-10-06 DIAGNOSIS — W06XXXA Fall from bed, initial encounter: Secondary | ICD-10-CM | POA: Insufficient documentation

## 2021-10-06 DIAGNOSIS — R519 Headache, unspecified: Secondary | ICD-10-CM

## 2021-10-06 DIAGNOSIS — E039 Hypothyroidism, unspecified: Secondary | ICD-10-CM | POA: Diagnosis not present

## 2021-10-06 DIAGNOSIS — S0990XA Unspecified injury of head, initial encounter: Secondary | ICD-10-CM

## 2021-10-06 IMAGING — CT CT HEAD W/O CM
4 series · 16 of 47 positions shown, 18 images · non-contrast
Comparison: None.

CLINICAL DATA: Head trauma, mod-severe head injury. Fell last
night; unknown if she had LOC; no h/o stroke;

EXAM:
CT HEAD WITHOUT CONTRAST
TECHNIQUE: Contiguous axial images were obtained from the base of the skull
through the vertex without intravenous contrast.

[Series 2: head wo · axial · 0.45mm/px · z∈[-135,-25]mm · 7 of 30 slices shown, 9 images]
[im 4/30  brain]
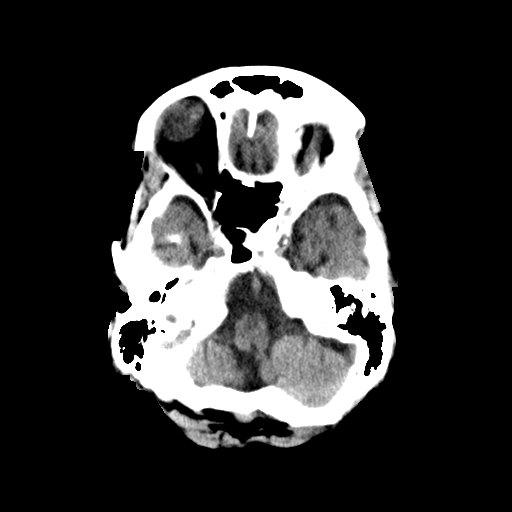
[im 4/30  bone]
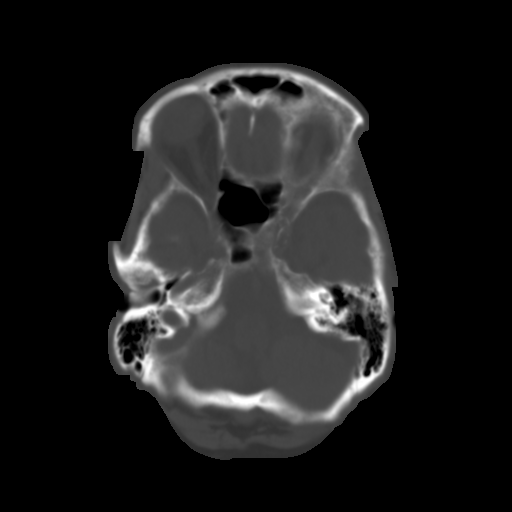
[im 8/30  brain]
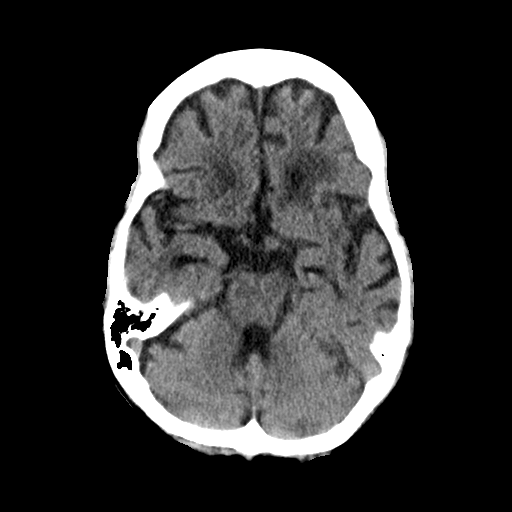
[im 11/30  brain]
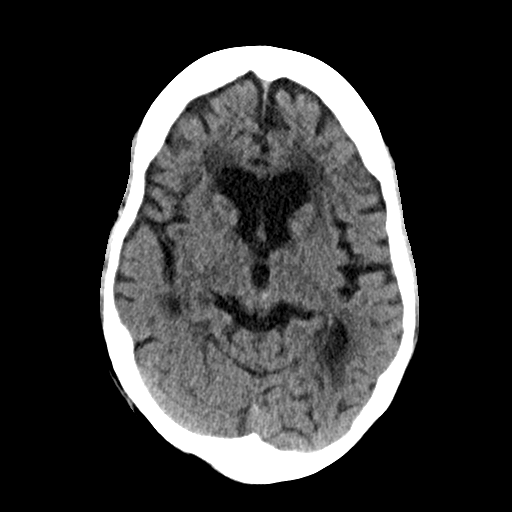
[im 15/30  brain]
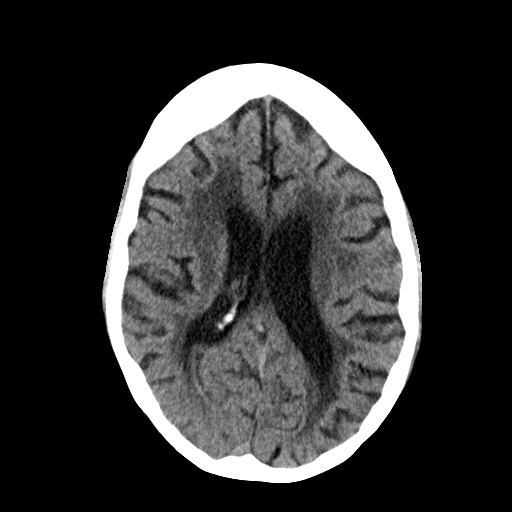
[im 19/30  brain]
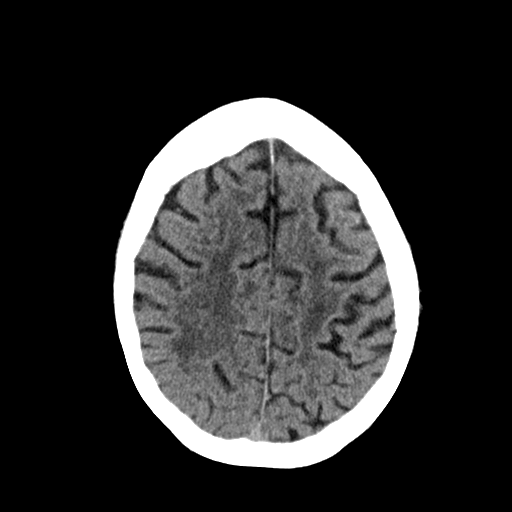
[im 19/30  bone]
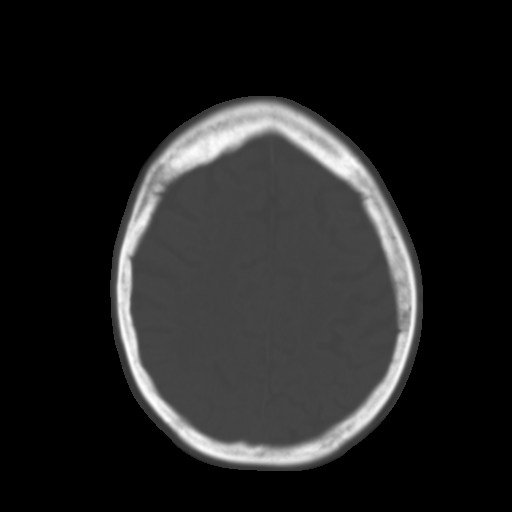
[im 22/30  brain]
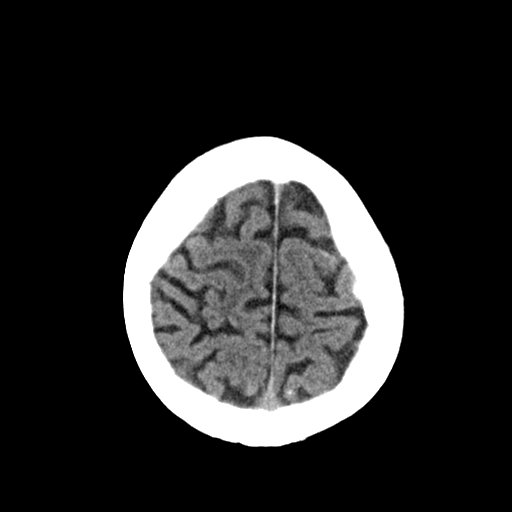
[im 26/30  brain]
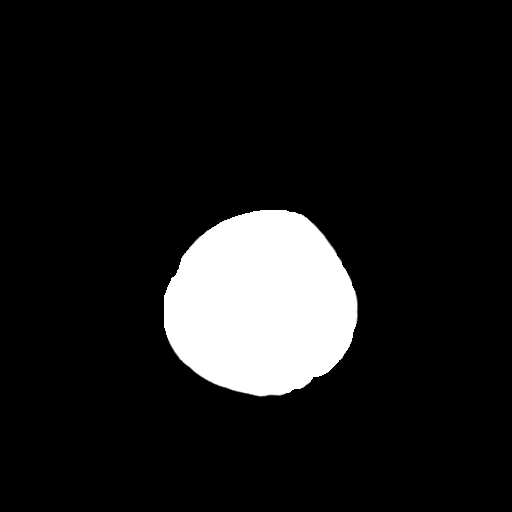

[Series 3: head bone · axial · 0.45mm/px · z∈[-136,-106]mm · 3 of 75 slices shown]
[im 8/75  bone]
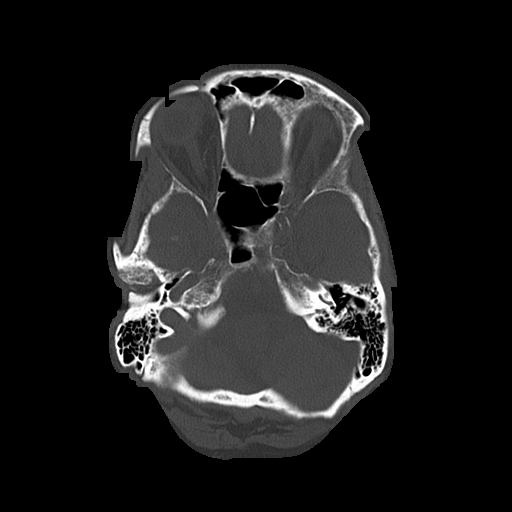
[im 15/75  bone]
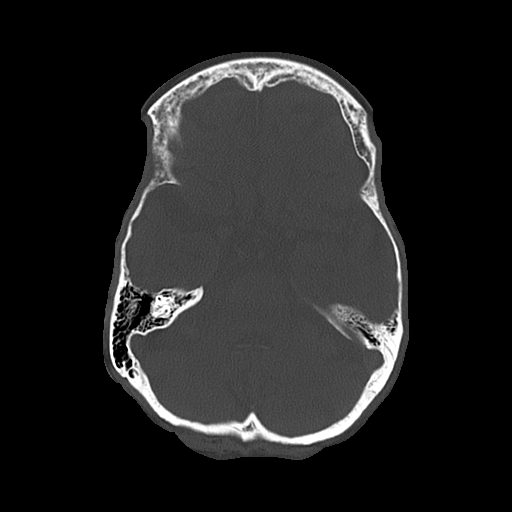
[im 23/75  bone]
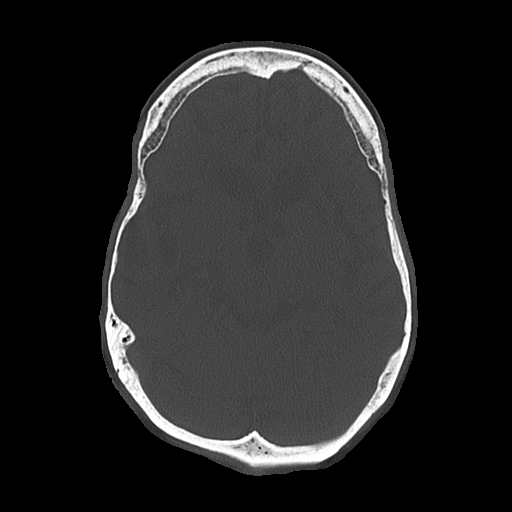

[Series 4: coronal soft · coronal · 0.29mm/px · 3 of 70 slices shown]
[im 24/70  brain]
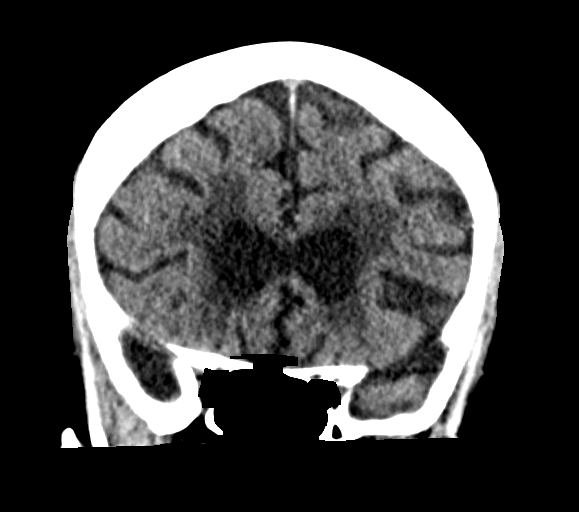
[im 31/70  brain]
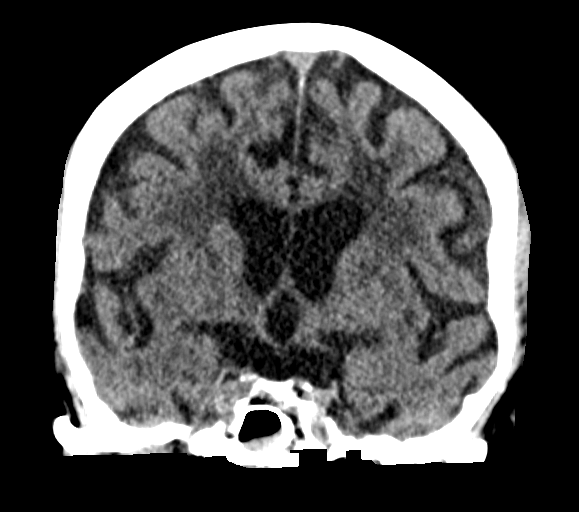
[im 39/70  brain]
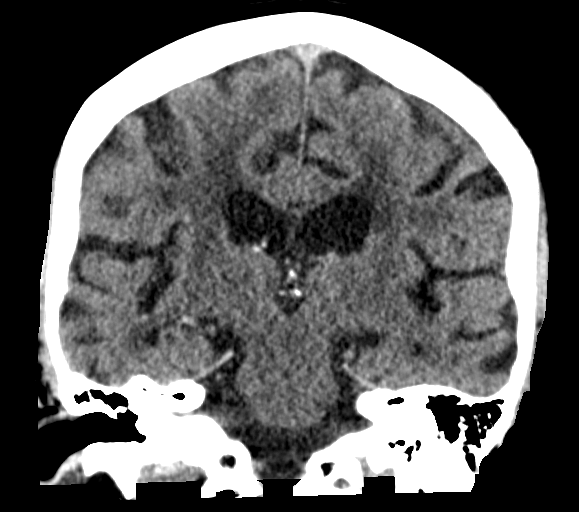

[Series 5: sagittal soft · sagittal · 0.29mm/px · 3 of 51 slices shown]
[im 17/51  brain]
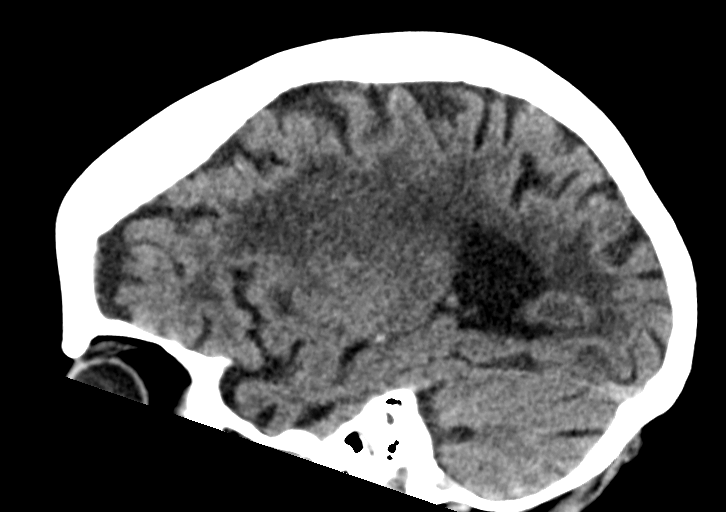
[im 26/51  brain]
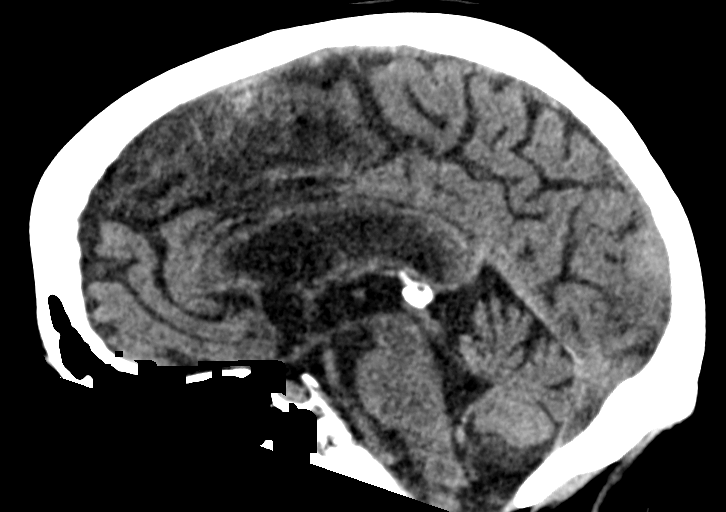
[im 34/51  brain]
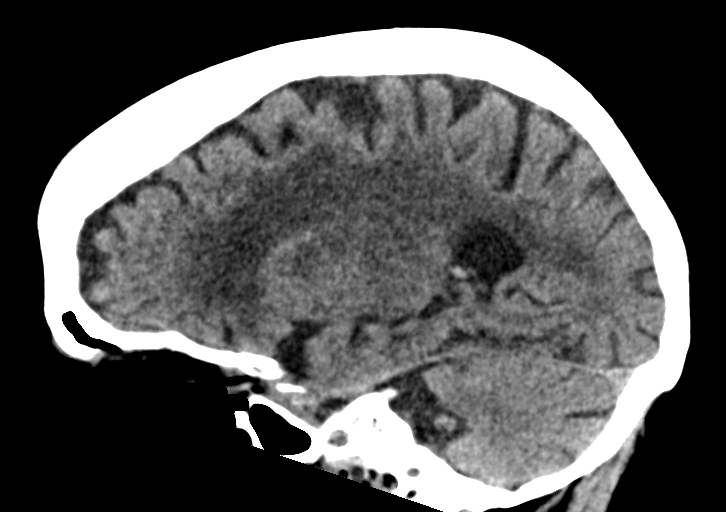

[16 of 47 positions shown; findings below may reference images not displayed]

BRAIN:
BRAIN
Cerebral ventricle sizes are concordant with the degree of cerebral
volume loss. Patchy and confluent areas of decreased attenuation are
noted throughout the deep and periventricular white matter of the
cerebral hemispheres bilaterally, compatible with chronic
microvascular ischemic disease.

No evidence of large-territorial acute infarction. No parenchymal
hemorrhage. No mass lesion. No extra-axial collection.

No mass effect or midline shift. No hydrocephalus. Basilar cisterns
are patent.

Vascular: No hyperdense vessel. Atherosclerotic calcifications are
present within the cavernous internal carotid arteries.

Skull: No acute fracture or focal lesion.

Sinuses/Orbits: Mild mucosal thickening of the right sphenoid sinus.
Paranasal sinuses and mastoid air cells are clear. The orbits are
unremarkable.

Other: None.
IMPRESSION: No acute intracranial abnormality.

## 2021-10-06 NOTE — ED Triage Notes (Signed)
Pt POV reports sliding off edge of bed, hit head on hard wood floors. Denies LOC, denies taking blood thinners. C/o posterior head pain, poor PO intake today. C/o nausea/vomitting last night. Slight nausea during triage.

## 2021-10-06 NOTE — ED Notes (Signed)
Pt verbalized understanding of discharge instructions; opportunity for questions provided °

## 2021-10-06 NOTE — ED Provider Notes (Signed)
[facility] DEPT Provider Note   CSN: [id number] Arrival date & time: 10/06/21  1339     History Chief Complaint  Patient presents with   Holly Phelps is a 60 y.o. female.  Presented to the emergency room with concern for fall.  Patient states that she slid off the edge of the bed and hit her head on the hardwood floors.  No LOC.  Today having head pain, worse on the back of her head.  No nausea or vomiting.  Otherwise feels fine.  Denies any other injuries.  Has been walking without difficulty since the fall.  HPI     Past Medical History:  Diagnosis Date   Allergy    Anemia    Calcification of aorta (HCC)    Fatty liver    GERD (gastroesophageal reflux disease)    Headache    Hypertension    Hypothyroidism    Tuberculosis    as baby    Patient Active Problem List   Diagnosis Date Noted   Gouty arthritis 10/26/2020   Need for hepatitis B vaccination 01/12/2016   Calcification of aorta (HCC)    Recurrent Clostridium difficile diarrhea 11/18/2015   Macrocytic anemia 11/18/2015   Nausea and vomiting 11/18/2015   Leucocytosis 11/17/2015   Protein-calorie malnutrition, severe 11/17/2015   Hypomagnesemia 09/08/2015   Hypokalemia 09/07/2015   Tobacco use disorder 09/07/2015   Cocaine abuse ([place]) 05/27/2015   Anemia 01/29/2013   Nausea vomiting and diarrhea 01/26/2013   Esophagitis 01/26/2013   ANXIETY 03/01/2008   Alcohol abuse 03/01/2008   Elevated LFTs 03/01/2008    Past Surgical History:  Procedure Laterality Date   BACK SURGERY     x 2   COLONOSCOPY N/A 03/13/2013   Procedure: COLONOSCOPY;  Surgeon: Leighton Ruff, MD;  Location: [facility];  Service: Endoscopy;  Laterality: N/A;   LAPAROSCOPIC APPENDECTOMY       OB History   No obstetric history on file.     Family History  Problem Relation Age of Onset   Stroke Father    Stroke Brother    Stroke Brother     Social History   Tobacco Use   Smoking  status: Every Day    Packs/day: 0.20    Types: Cigarettes   Smokeless tobacco: Never   Tobacco comments:    3 cigarettes per day  Substance Use Topics   Alcohol use: Yes    Alcohol/week: 10.0 standard drinks    Types: 10 Glasses of wine per week    Comment: working to reduce this due to illness   Drug use: Yes    Types: Cocaine    Comment: rarely     Home Medications Prior to Admission medications   Medication Sig Start Date End Date Taking? Authorizing Provider  allopurinol (ZYLOPRIM) 100 MG tablet Take 1 tablet (100 mg total) by mouth daily. 10/26/20   Maximiano Coss, NP  famotidine (PEPCID) 20 MG tablet Take 1 tablet (20 mg total) by mouth 2 (two) times daily. 04/29/21   Carlisle Cater, PA-C  indomethacin (INDOCIN) 50 MG capsule Take 1 capsule (50 mg total) by mouth 3 (three) times daily with meals. 10/25/20   Maximiano Coss, NP  Multiple Vitamins-Minerals (MULTIVITAMIN ADULTS PO) Take by mouth.    [provider]  naproxen (NAPROSYN) 500 MG tablet Take 1 tablet (500 mg total) by mouth 2 (two) times daily with a meal. 12/21/16   Emeterio Reeve, DO  promethazine (PHENERGAN) 25  MG suppository Place 1 suppository (25 mg total) rectally every 6 (six) hours as needed for nausea or vomiting. 04/29/21   Carlisle Cater, PA-C    Allergies    Codeine, Flexeril [cyclobenzaprine], Sulfa antibiotics, and Synthroid [levothyroxine]  Review of Systems   Review of Systems  Constitutional:  Positive for fatigue. Negative for chills and fever.  HENT:  Negative for ear pain and sore throat.   Eyes:  Negative for pain and visual disturbance.  Respiratory:  Negative for cough and shortness of breath.   Cardiovascular:  Negative for chest pain and palpitations.  Gastrointestinal:  Negative for abdominal pain and vomiting.  Genitourinary:  Negative for dysuria and hematuria.  Musculoskeletal:  Negative for arthralgias and back pain.  Skin:  Negative for color change and rash.   Neurological:  Positive for headaches. Negative for seizures and syncope.  All other systems reviewed and are negative.  Physical Exam Updated Vital Signs BP (!) 138/93   Pulse 98   Temp 97.9 F (36.6 C) (Oral)   Resp 18   Ht 5\' 2"  (1.575 m)   Wt 49.9 kg   SpO2 99%   BMI 20.12 kg/m   Physical Exam Vitals and nursing note reviewed.  Constitutional:      General: She is not in acute distress.    Appearance: She is well-developed.  HENT:     Head: Normocephalic.     Comments: Small hematoma to left posterior head, no laceration Eyes:     Conjunctiva/sclera: Conjunctivae normal.  Cardiovascular:     Rate and Rhythm: Normal rate and regular rhythm.     Pulses: Normal pulses.  Pulmonary:     Effort: Pulmonary effort is normal. No respiratory distress.     Breath sounds: Normal breath sounds.  Abdominal:     Palpations: Abdomen is soft.     Tenderness: There is no abdominal tenderness.  Musculoskeletal:        General: No swelling.     Cervical back: Neck supple.     Comments: Back: no C, T, L spine TTP, no step off or deformity RUE: no TTP throughout, no deformity, normal joint ROM, radial pulse intact, distal sensation and motor intact LUE: no TTP throughout, no deformity, normal joint ROM, radial pulse intact, distal sensation and motor intact RLE:  no TTP throughout, no deformity, normal joint ROM, distal pulse, sensation and motor intact LLE: no TTP throughout, no deformity, normal joint ROM, distal pulse, sensation and motor intact  Skin:    General: Skin is warm and dry.     Capillary Refill: Capillary refill takes less than 2 seconds.  Neurological:     General: No focal deficit present.     Mental Status: She is alert.  Psychiatric:        Mood and Affect: Mood normal.    ED Results / Procedures / Treatments   Labs (all labs ordered are listed, but only abnormal results are displayed) Labs Reviewed - No data to display  EKG None  Radiology CT Head Wo  Contrast  Result Date: 10/06/2021 CLINICAL DATA:  Head trauma, mod-severe head injury. Fell last night; unknown if she had LOC; no h/o stroke; EXAM: CT HEAD WITHOUT CONTRAST TECHNIQUE: Contiguous axial images were obtained from the base of the skull through the vertex without intravenous contrast. COMPARISON:  None. BRAIN: BRAIN Cerebral ventricle sizes are concordant with the degree of cerebral volume loss. Patchy and confluent areas of decreased attenuation are noted throughout the deep and  periventricular white matter of the cerebral hemispheres bilaterally, compatible with chronic microvascular ischemic disease. No evidence of large-territorial acute infarction. No parenchymal hemorrhage. No mass lesion. No extra-axial collection. No mass effect or midline shift. No hydrocephalus. Basilar cisterns are patent. Vascular: No hyperdense vessel. Atherosclerotic calcifications are present within the cavernous internal carotid arteries. Skull: No acute fracture or focal lesion. Sinuses/Orbits: Mild mucosal thickening of the right sphenoid sinus. Paranasal sinuses and mastoid air cells are clear. The orbits are unremarkable. Other: None. IMPRESSION: No acute intracranial abnormality. Electronically Signed   By: Iven Finn M.D.   On: 10/06/2021 16:08    Procedures Procedures   Medications Ordered in ED Medications - No data to display  ED Course  I have reviewed the triage vital signs and the nursing notes.  Pertinent labs & imaging results that were available during my care of the patient were reviewed by me and considered in my medical decision making (see chart for details).    MDM Rules/Calculators/A&P                           60 year old lady presents to ER with concern for fall, head trauma.  She does have a small hematoma to the left posterior head.  No laceration.  She otherwise appears well in no distress.  CT head negative.  No other injuries identified.  Discharged home.  After the  discussed management above, the patient was determined to be safe for discharge.  The patient was in agreement with this plan and all questions regarding their care were answered.  ED return precautions were discussed and the patient will return to the ED with any significant worsening of condition.  Final Clinical Impression(s) / ED Diagnoses Final diagnoses:  Traumatic injury of head, initial encounter  Fall, initial encounter  Nonintractable headache, unspecified chronicity pattern, unspecified headache type    Rx / DC Orders ED Discharge Orders     None        Lucrezia Starch, MD 10/06/21 1843

## 2021-10-06 NOTE — Discharge Instructions (Signed)
Take Tylenol or Motrin as needed for pain.  Come back to ER if you have uncontrolled pain, vomiting, or other new concerning symptom

## 2021-11-09 ENCOUNTER — Other Ambulatory Visit: Payer: Self-pay

## 2021-11-09 ENCOUNTER — Encounter (HOSPITAL_COMMUNITY): Payer: Self-pay | Admitting: Emergency Medicine

## 2021-11-09 ENCOUNTER — Emergency Department (HOSPITAL_COMMUNITY): Payer: 59

## 2021-11-09 ENCOUNTER — Inpatient Hospital Stay (HOSPITAL_COMMUNITY)
Admission: EM | Admit: 2021-11-09 | Discharge: 2021-11-11 | DRG: 064 | Disposition: A | Discharge status: Home / Self Care | Payer: 59 | Attending: Internal Medicine | Admitting: Internal Medicine

## 2021-11-09 ENCOUNTER — Ambulatory Visit: Payer: 59

## 2021-11-09 DIAGNOSIS — F17200 Nicotine dependence, unspecified, uncomplicated: Secondary | ICD-10-CM | POA: Diagnosis not present

## 2021-11-09 DIAGNOSIS — E039 Hypothyroidism, unspecified: Secondary | ICD-10-CM | POA: Diagnosis present

## 2021-11-09 DIAGNOSIS — Z882 Allergy status to sulfonamides status: Secondary | ICD-10-CM

## 2021-11-09 DIAGNOSIS — H532 Diplopia: Secondary | ICD-10-CM

## 2021-11-09 DIAGNOSIS — Z7151 Drug abuse counseling and surveillance of drug abuser: Secondary | ICD-10-CM

## 2021-11-09 DIAGNOSIS — Z8673 Personal history of transient ischemic attack (TIA), and cerebral infarction without residual deficits: Secondary | ICD-10-CM

## 2021-11-09 DIAGNOSIS — F419 Anxiety disorder, unspecified: Secondary | ICD-10-CM

## 2021-11-09 DIAGNOSIS — Z7982 Long term (current) use of aspirin: Secondary | ICD-10-CM | POA: Diagnosis not present

## 2021-11-09 DIAGNOSIS — E43 Unspecified severe protein-calorie malnutrition: Secondary | ICD-10-CM | POA: Diagnosis present

## 2021-11-09 DIAGNOSIS — Z79899 Other long term (current) drug therapy: Secondary | ICD-10-CM | POA: Diagnosis not present

## 2021-11-09 DIAGNOSIS — Z91148 Patient's other noncompliance with medication regimen for other reason: Secondary | ICD-10-CM

## 2021-11-09 DIAGNOSIS — Z716 Tobacco abuse counseling: Secondary | ICD-10-CM

## 2021-11-09 DIAGNOSIS — K219 Gastro-esophageal reflux disease without esophagitis: Secondary | ICD-10-CM | POA: Diagnosis present

## 2021-11-09 DIAGNOSIS — R64 Cachexia: Secondary | ICD-10-CM | POA: Diagnosis present

## 2021-11-09 DIAGNOSIS — I6389 Other cerebral infarction: Secondary | ICD-10-CM | POA: Diagnosis not present

## 2021-11-09 DIAGNOSIS — Z823 Family history of stroke: Secondary | ICD-10-CM | POA: Diagnosis not present

## 2021-11-09 DIAGNOSIS — Z8611 Personal history of tuberculosis: Secondary | ICD-10-CM

## 2021-11-09 DIAGNOSIS — K760 Fatty (change of) liver, not elsewhere classified: Secondary | ICD-10-CM | POA: Diagnosis present

## 2021-11-09 DIAGNOSIS — Z6820 Body mass index (BMI) 20.0-20.9, adult: Secondary | ICD-10-CM

## 2021-11-09 DIAGNOSIS — I619 Nontraumatic intracerebral hemorrhage, unspecified: Secondary | ICD-10-CM

## 2021-11-09 DIAGNOSIS — D649 Anemia, unspecified: Secondary | ICD-10-CM | POA: Diagnosis present

## 2021-11-09 DIAGNOSIS — Z885 Allergy status to narcotic agent status: Secondary | ICD-10-CM

## 2021-11-09 DIAGNOSIS — H5121 Internuclear ophthalmoplegia, right eye: Secondary | ICD-10-CM

## 2021-11-09 DIAGNOSIS — F1090 Alcohol use, unspecified, uncomplicated: Secondary | ICD-10-CM | POA: Diagnosis present

## 2021-11-09 DIAGNOSIS — Z888 Allergy status to other drugs, medicaments and biological substances status: Secondary | ICD-10-CM

## 2021-11-09 DIAGNOSIS — F1010 Alcohol abuse, uncomplicated: Secondary | ICD-10-CM | POA: Diagnosis not present

## 2021-11-09 DIAGNOSIS — F1410 Cocaine abuse, uncomplicated: Secondary | ICD-10-CM | POA: Diagnosis not present

## 2021-11-09 DIAGNOSIS — I639 Cerebral infarction, unspecified: Secondary | ICD-10-CM | POA: Diagnosis present

## 2021-11-09 DIAGNOSIS — M109 Gout, unspecified: Secondary | ICD-10-CM | POA: Diagnosis present

## 2021-11-09 DIAGNOSIS — F17210 Nicotine dependence, cigarettes, uncomplicated: Secondary | ICD-10-CM | POA: Diagnosis present

## 2021-11-09 DIAGNOSIS — Z20822 Contact with and (suspected) exposure to covid-19: Secondary | ICD-10-CM | POA: Diagnosis present

## 2021-11-09 DIAGNOSIS — I10 Essential (primary) hypertension: Secondary | ICD-10-CM

## 2021-11-09 DIAGNOSIS — Z9114 Patient's other noncompliance with medication regimen: Secondary | ICD-10-CM | POA: Diagnosis not present

## 2021-11-09 DIAGNOSIS — F1910 Other psychoactive substance abuse, uncomplicated: Secondary | ICD-10-CM | POA: Diagnosis present

## 2021-11-09 DIAGNOSIS — I6381 Other cerebral infarction due to occlusion or stenosis of small artery: Secondary | ICD-10-CM | POA: Diagnosis not present

## 2021-11-09 DIAGNOSIS — E785 Hyperlipidemia, unspecified: Secondary | ICD-10-CM | POA: Diagnosis present

## 2021-11-09 LAB — CBC WITH DIFFERENTIAL/PLATELET
Abs Immature Granulocytes: 0.03 10*3/uL (ref 0.00–0.07)
Basophils Absolute: 0.1 10*3/uL (ref 0.0–0.1)
Basophils Relative: 1 %
Eosinophils Absolute: 0.1 10*3/uL (ref 0.0–0.5)
Eosinophils Relative: 2 %
HCT: 37.7 % (ref 36.0–46.0)
Hemoglobin: 12.3 g/dL (ref 12.0–15.0)
Immature Granulocytes: 0 %
Lymphocytes Relative: 13 %
Lymphs Abs: 1.1 10*3/uL (ref 0.7–4.0)
MCH: 35 pg — ABNORMAL HIGH (ref 26.0–34.0)
MCHC: 32.6 g/dL (ref 30.0–36.0)
MCV: 107.4 fL — ABNORMAL HIGH (ref 80.0–100.0)
Monocytes Absolute: 1.1 10*3/uL — ABNORMAL HIGH (ref 0.1–1.0)
Monocytes Relative: 13 %
Neutro Abs: 6 10*3/uL (ref 1.7–7.7)
Neutrophils Relative %: 71 %
Platelets: 294 10*3/uL (ref 150–400)
RBC: 3.51 MIL/uL — ABNORMAL LOW (ref 3.87–5.11)
RDW: 13.7 % (ref 11.5–15.5)
WBC: 8.5 10*3/uL (ref 4.0–10.5)
nRBC: 0 % (ref 0.0–0.2)

## 2021-11-09 LAB — COMPREHENSIVE METABOLIC PANEL
ALT: 48 U/L — ABNORMAL HIGH (ref 0–44)
AST: 78 U/L — ABNORMAL HIGH (ref 15–41)
Albumin: 4.5 g/dL (ref 3.5–5.0)
Alkaline Phosphatase: 107 U/L (ref 38–126)
Anion gap: 18 — ABNORMAL HIGH (ref 5–15)
BUN: 13 mg/dL (ref 6–20)
CO2: 20 mmol/L — ABNORMAL LOW (ref 22–32)
Calcium: 9.4 mg/dL (ref 8.9–10.3)
Chloride: 98 mmol/L (ref 98–111)
Creatinine, Ser: 0.73 mg/dL (ref 0.44–1.00)
GFR, Estimated: >60 mL/min (ref >60)
Glucose, Bld: 77 mg/dL (ref 70–99)
Potassium: 3.6 mmol/L (ref 3.5–5.1)
Sodium: 136 mmol/L (ref 135–145)
Total Bilirubin: 1.4 mg/dL — ABNORMAL HIGH (ref 0.3–1.2)
Total Protein: 8 g/dL (ref 6.5–8.1)

## 2021-11-09 LAB — RESP PANEL BY RT-PCR (FLU A&B, COVID) ARPGX2
Influenza A by PCR: NEGATIVE
Influenza B by PCR: NEGATIVE
SARS Coronavirus 2 by RT PCR: NEGATIVE

## 2021-11-09 LAB — CBG MONITORING, ED
Glucose-Capillary: 74 mg/dL (ref 70–99)
Glucose-Capillary: 66 mg/dL — ABNORMAL LOW (ref 70–99)

## 2021-11-09 IMAGING — MR MR HEAD WO/W CM
13 series · 48 of 48 positions shown · IV contrast (gadavist)
Comparison: CT head [DATE]

CLINICAL DATA: Diplopia.  Head injury 1 month ago.

EXAM:
MRI HEAD AND ORBITS WITHOUT AND WITH CONTRAST
TECHNIQUE: Multiplanar, multiecho pulse sequences of the brain and surrounding
structures were obtained without and with intravenous contrast.
Multiplanar, multiecho pulse sequences of the orbits and surrounding
structures were obtained including fat saturation techniques, before
and after intravenous contrast administration.
CONTRAST:  5mL GADAVIST GADOBUTROL 1 MMOL/ML IV SOLN

[Series 5: T1 · sagittal · 5.0mm · 0.75mm/px · 1 of 25 slices shown (1 of 2)]
[im 1/25]
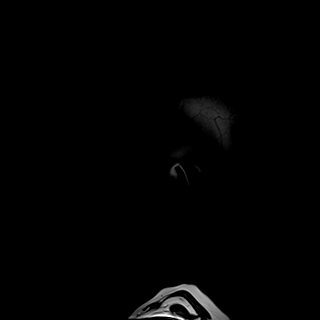

[Series 6: T2 · axial · 5.0mm · 0.62mm/px · z∈[-106,+41]mm · 2 of 24 slices shown]
[im 1/24]
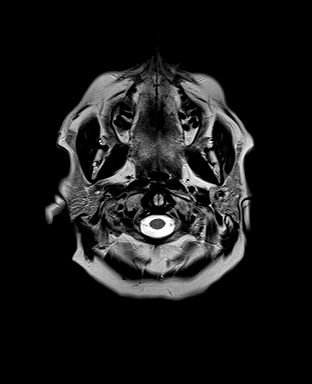
[im 24/24]
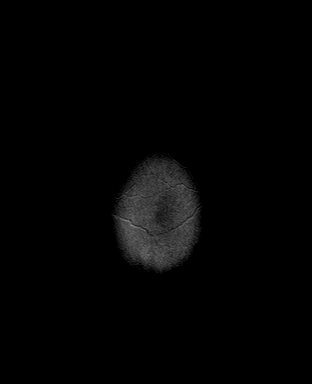

[Series 7: swi_images · axial · 3.0mm · 0.75mm/px · z∈[-138,+72]mm · 5 of 72 slices shown]
[im 1/72]
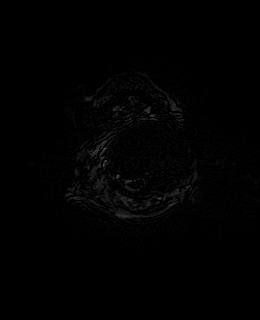
[im 18/72]
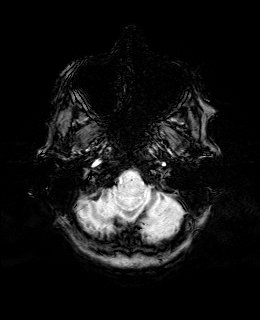
[im 36/72]
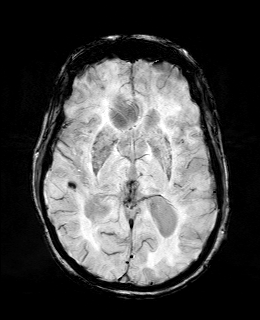
[im 54/72]
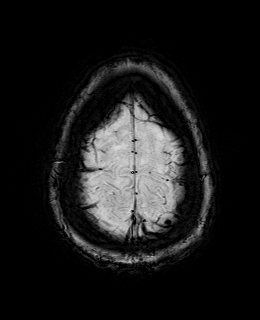
[im 72/72]
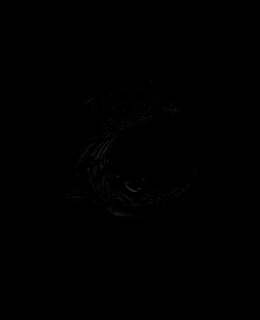

[Series 9: FLAIR · axial · 3.0mm · 0.75mm/px · z∈[-108,+43]mm · 3 of 52 slices shown]
[im 1/52]
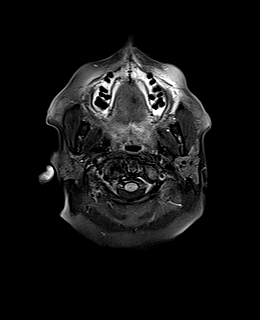
[im 26/52]
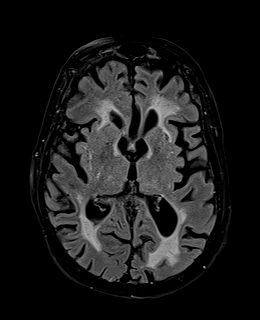
[im 52/52]
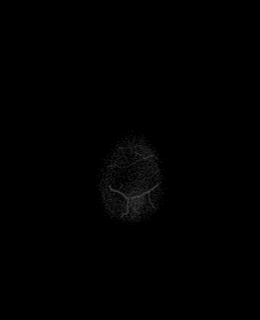

[Series 10: T1 · axial · 1.0mm · 0.94mm/px · z∈[-115,+43]mm · 10 of 160 slices shown (2 of 2)]
[im 1/160]
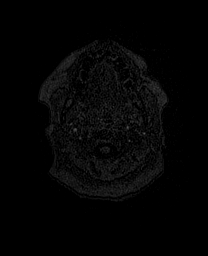
[im 18/160]
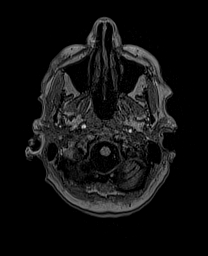
[im 36/160]
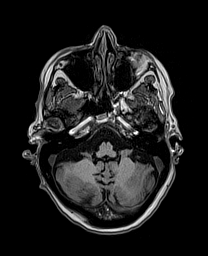
[im 54/160]
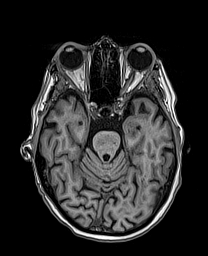
[im 71/160]
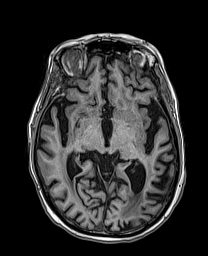
[im 89/160]
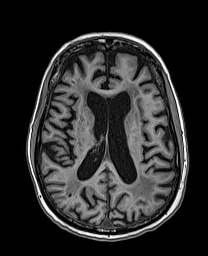
[im 107/160]
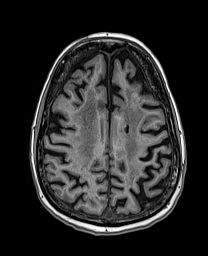
[im 124/160]
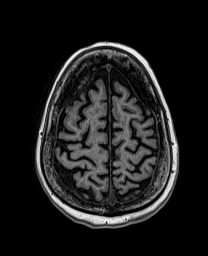
[im 142/160]
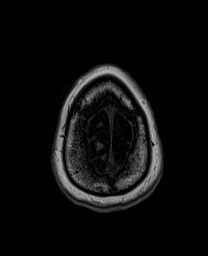
[im 160/160]
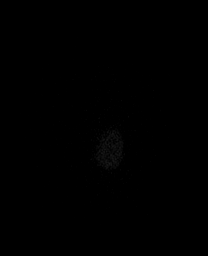

[Series 11: cor dwi_tracew · coronal · 5.0mm · 1.53mm/px · 4 of 58 slices shown]
[im 1/58]
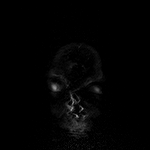
[im 20/58]
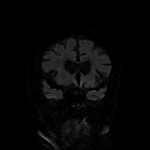
[im 39/58]
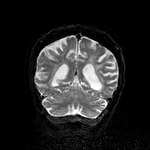
[im 58/58]
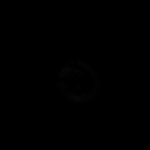

[Series 12: cor dwi_adc · coronal · 5.0mm · 1.53mm/px · 2 of 29 slices shown]
[im 1/29]
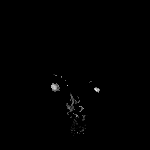
[im 29/29]
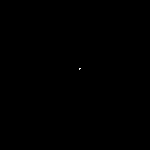

[Series 13: resolve dwi_tracew · axial · 5.0mm · 1.67mm/px · z∈[-107,+47]mm · 3 of 50 slices shown]
[im 1/50]
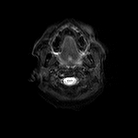
[im 25/50]
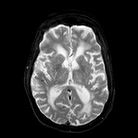
[im 50/50]
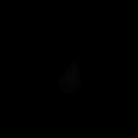

[Series 14: resolve dwi_adc · axial · 5.0mm · 1.67mm/px · z∈[-107,+47]mm · 2 of 25 slices shown]
[im 1/25]
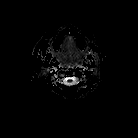
[im 25/25]
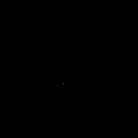

[Series 15: T2 post-contrast · coronal · 5.0mm · 0.57mm/px · 2 of 29 slices shown]
[im 1/29]
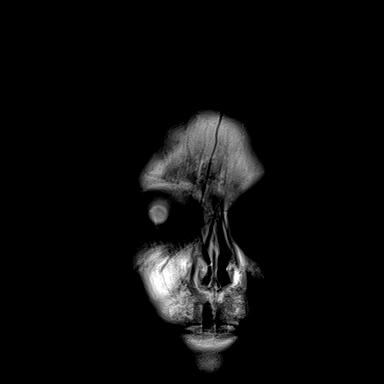
[im 29/29]
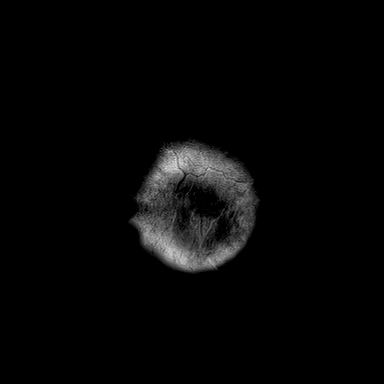

[Series 16: T1 post-contrast · axial · 1.0mm · 0.94mm/px · z∈[-115,+43]mm · 10 of 160 slices shown (1 of 3)]
[im 1/160]
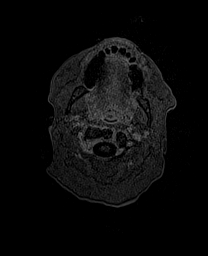
[im 18/160]
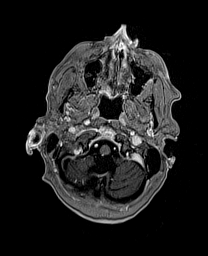
[im 36/160]
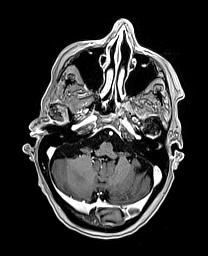
[im 54/160]
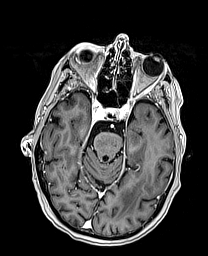
[im 71/160]
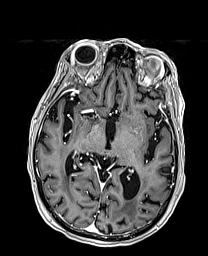
[im 89/160]
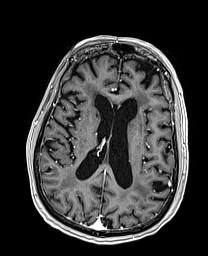
[im 107/160]
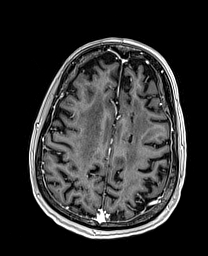
[im 124/160]
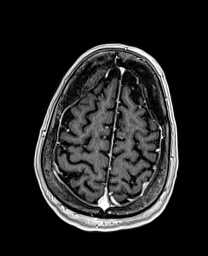
[im 142/160]
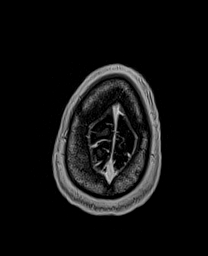
[im 160/160]
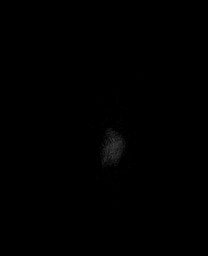

[Series 17: T1 post-contrast · coronal · 5.0mm · 0.43mm/px · 2 of 29 slices shown (2 of 3)]
[im 1/29]
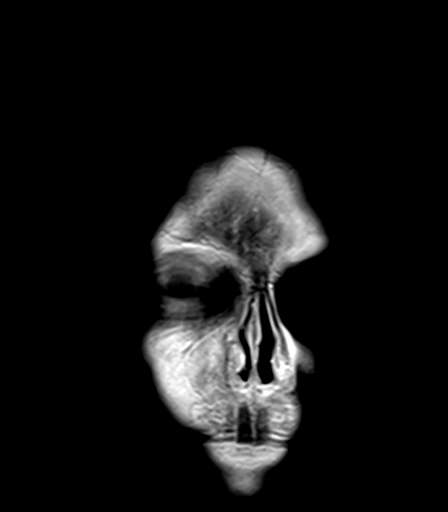
[im 29/29]
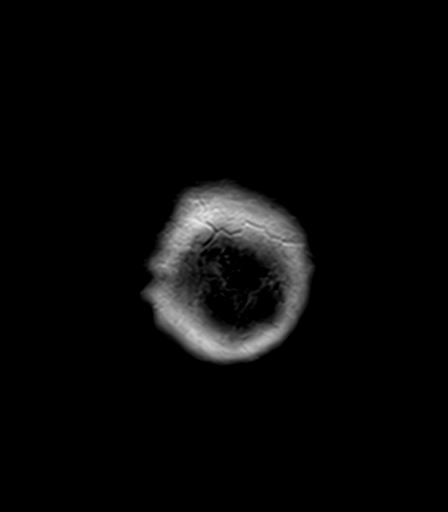

[Series 18: T1 post-contrast · sagittal · 5.0mm · 0.75mm/px · 2 of 25 slices shown (3 of 3)]
[im 1/25]
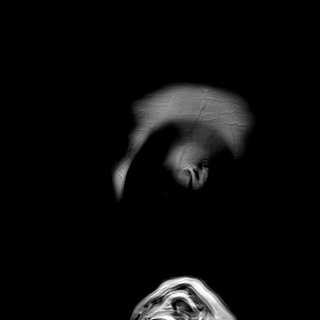
[im 25/25]
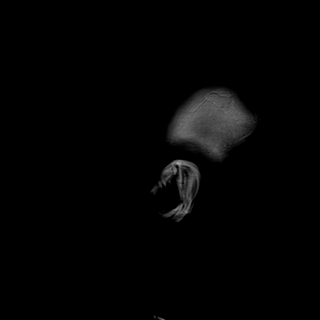

[48 of 48 positions shown; findings below may reference images not displayed]

FINDINGS: MRI HEAD FINDINGS

Brain: Advanced cerebral atrophy. Ventricular enlargement consistent
with the level of atrophy.

Negative for acute infarct. Extensive chronic microvascular ischemic
change in the white matter and pons. Multiple areas of chronic
microhemorrhage including in the pons bilaterally and in both
cerebral hemispheres.

Enhancing vein in the left parietal lobe compatible with
developmental venous anomaly

2 mm enhancement in the central pons. This likely is vascular
enhancement from capillary telangiectasia.

Vascular: Normal arterial flow voids.

Skull and upper cervical spine: No focal skeletal lesion. ACDF
cervical spine.

Other: None

MRI ORBITS FINDINGS

Orbits: No orbital mass or edema. Optic nerve normal bilaterally.
Extraocular muscles normal bilaterally. No enhancing lesion in the
orbit. Optic chiasm normal. Pituitary not enlarged.

Visualized sinuses: Clear

Soft tissues: No soft tissue swelling or mass
IMPRESSION: 1. No acute intracranial abnormality.
2. Extensive atrophy. Extensive chronic microvascular ischemia.
Multiple areas of chronic microhemorrhage in the brain including the
pons
3. Developmental venous anomaly left parietal lobe. Punctate
enhancement central pons most likely capillary telangiectasia
4. Negative orbit

## 2021-11-09 IMAGING — MR MR ORBITS WO/W CM
8 of 9 series · 39 of 48 positions shown · IV contrast (gadavist)
Comparison: CT head [DATE]

CLINICAL DATA: Diplopia.  Head injury 1 month ago.

EXAM:
MRI HEAD AND ORBITS WITHOUT AND WITH CONTRAST
TECHNIQUE: Multiplanar, multiecho pulse sequences of the brain and surrounding
structures were obtained without and with intravenous contrast.
Multiplanar, multiecho pulse sequences of the orbits and surrounding
structures were obtained including fat saturation techniques, before
and after intravenous contrast administration.
CONTRAST:  5mL GADAVIST GADOBUTROL 1 MMOL/ML IV SOLN

[Series 9: T1 · sagittal · 5.0mm · 0.75mm/px · 4 of 25 slices shown (1 of 4)]
[im 1/25]
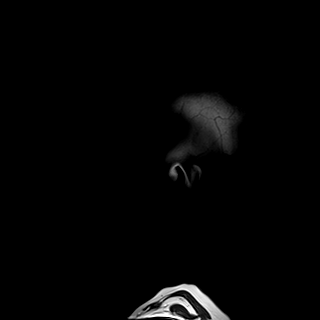
[im 9/25]
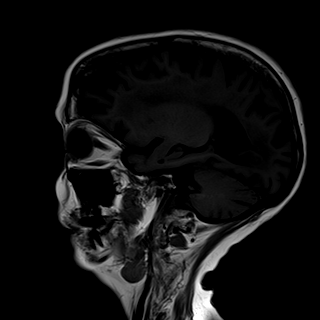
[im 17/25]
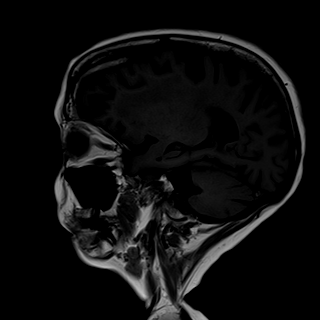
[im 25/25]
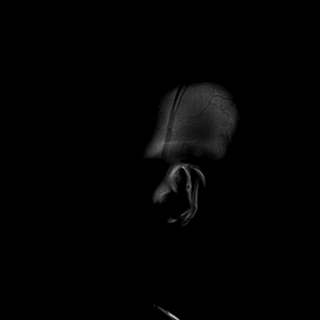

[Series 10: T1 · sagittal · 3.0mm · 0.75mm/px · 8 of 39 slices shown (2 of 4)]
[im 1/39]
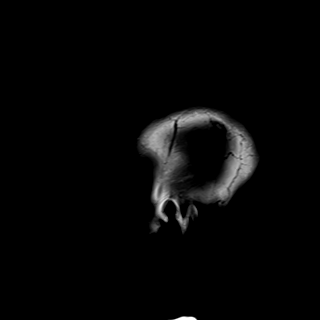
[im 6/39]
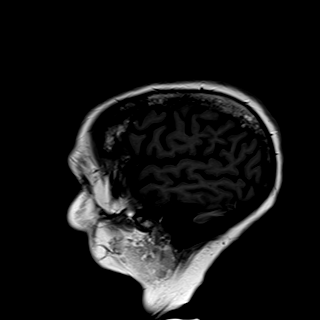
[im 11/39]
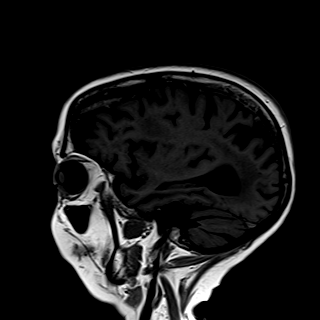
[im 17/39]
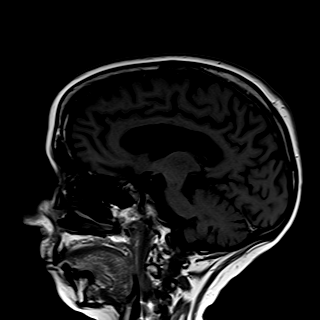
[im 22/39]
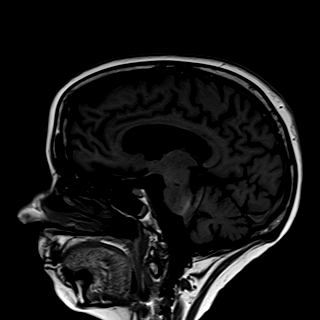
[im 28/39]
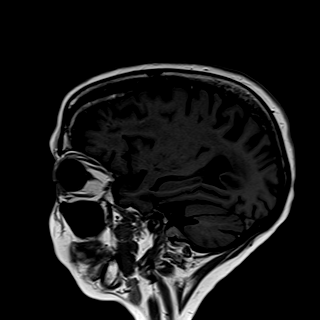
[im 33/39]
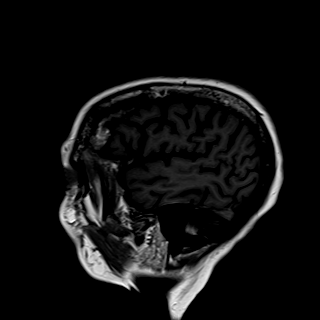
[im 39/39]
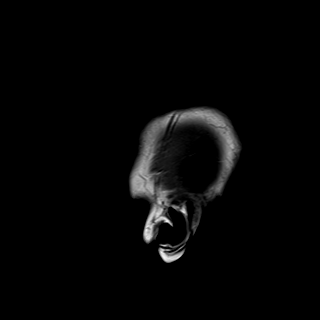

[Series 11: T2 fat-sat · axial · 3.0mm · 0.47mm/px · z∈[-91,-30]mm · 4 of 20 slices shown (1 of 3)]
[im 1/20]
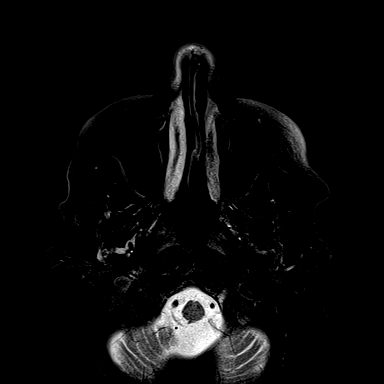
[im 7/20]
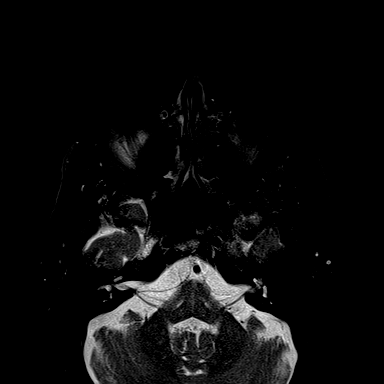
[im 13/20]
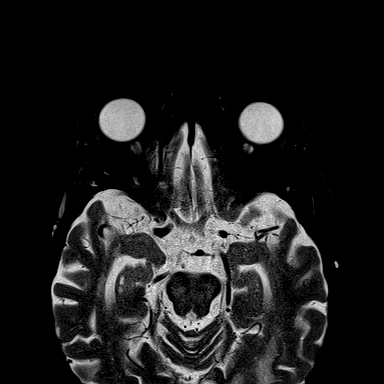
[im 20/20]
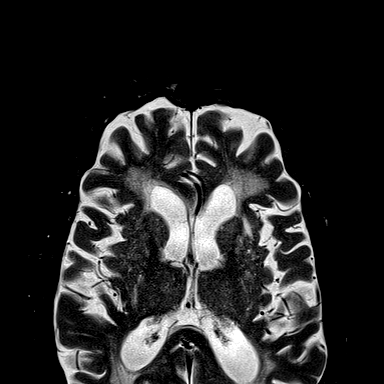

[Series 12: T2 fat-sat · coronal · 3.0mm · 0.47mm/px · 6 of 30 slices shown (2 of 3)]
[im 1/30]
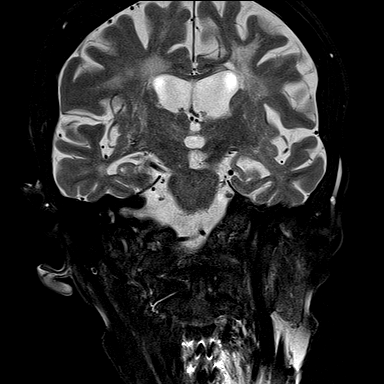
[im 6/30]
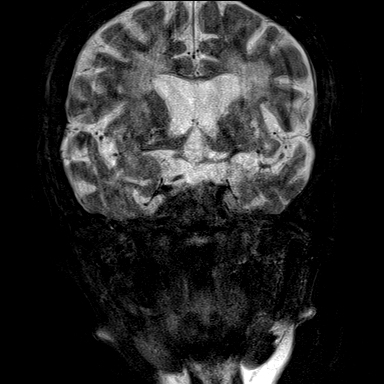
[im 12/30]
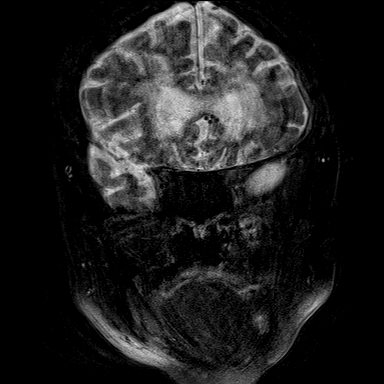
[im 18/30]
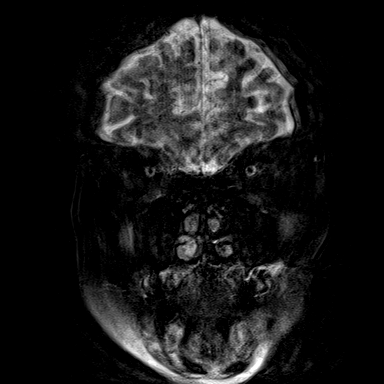
[im 24/30]
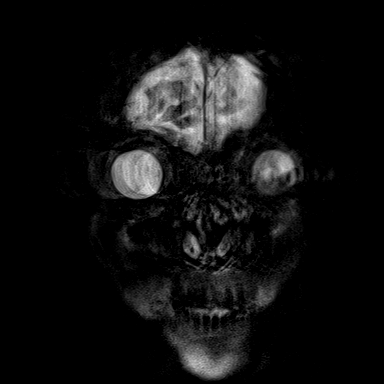
[im 30/30]
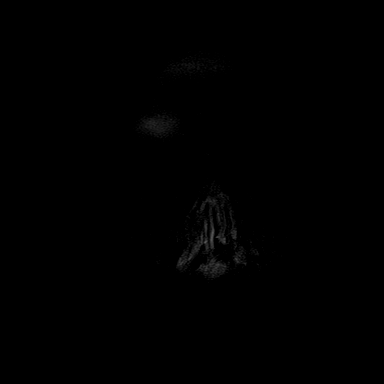

[Series 13: T1 · axial · 3.0mm · 0.56mm/px · z∈[-91,-30]mm · 4 of 20 slices shown (3 of 4)]
[im 1/20]
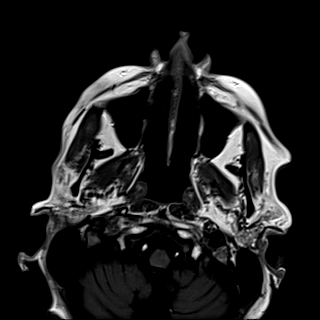
[im 7/20]
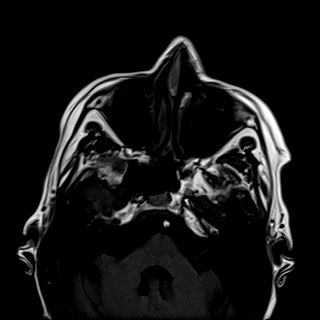
[im 13/20]
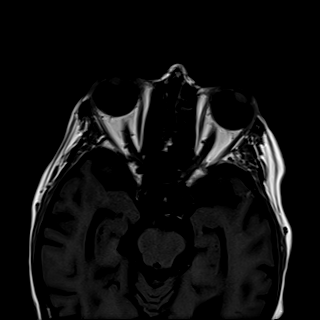
[im 20/20]
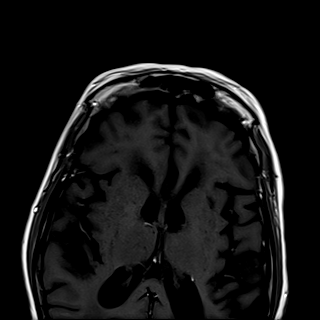

[Series 14: T2 fat-sat · coronal · 3.0mm · 0.47mm/px · 6 of 30 slices shown (3 of 3)]
[im 1/30]
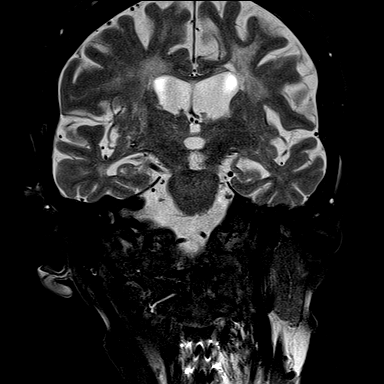
[im 6/30]
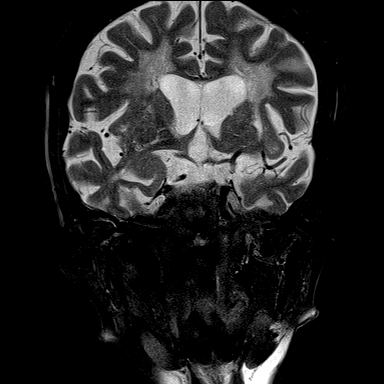
[im 12/30]
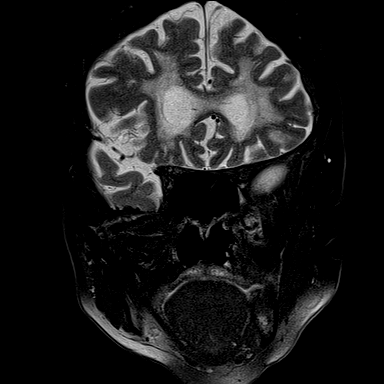
[im 18/30]
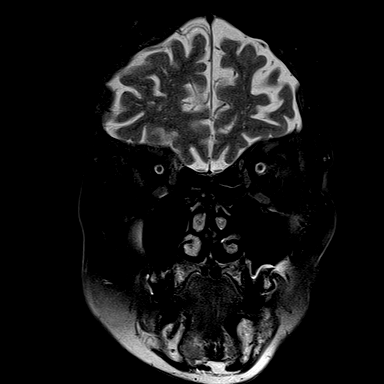
[im 24/30]
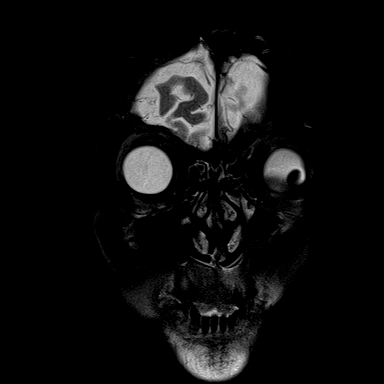
[im 30/30]
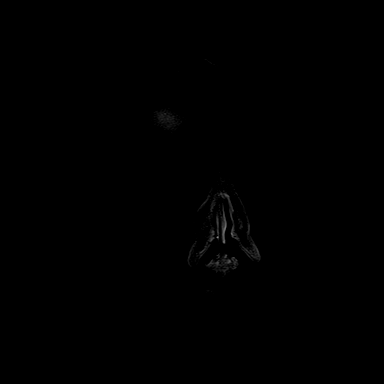

[Series 15: T1 · coronal · 3.0mm · 0.56mm/px · 6 of 30 slices shown (4 of 4)]
[im 1/30]
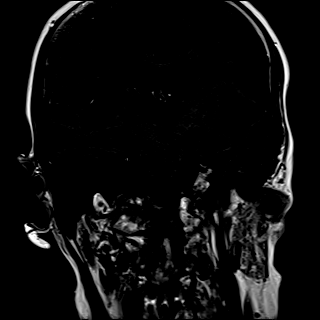
[im 6/30]
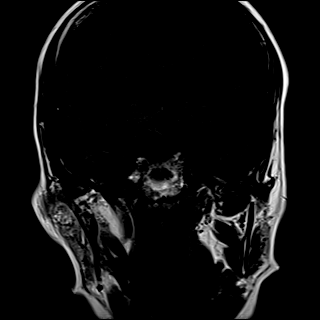
[im 12/30]
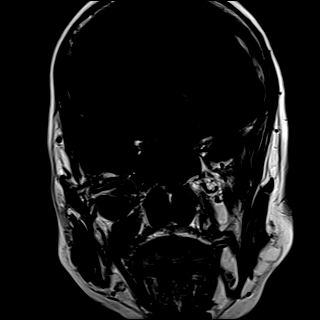
[im 18/30]
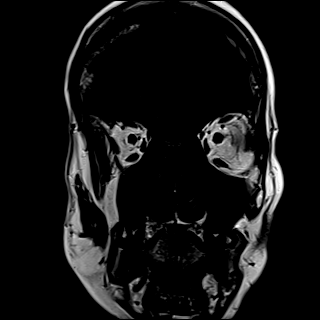
[im 24/30]
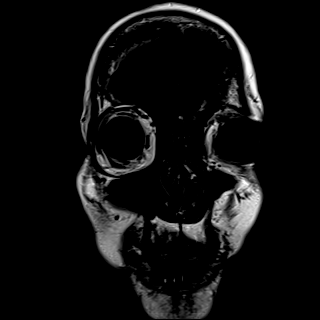
[im 30/30]
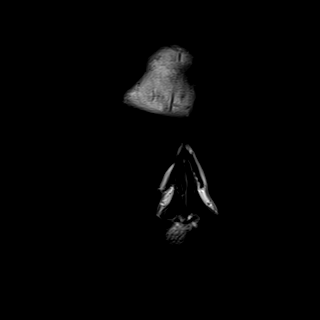

[Series 16: T1 fat-sat post-contrast · axial · 3.0mm · 0.56mm/px · 1 of 20 slices shown]
[im 1/20]
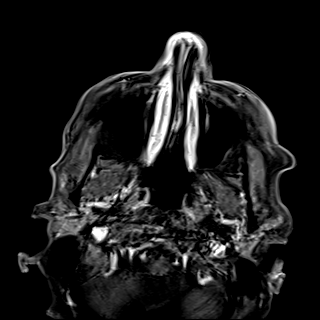

[39 of 48 positions shown; findings below may reference images not displayed]

FINDINGS: MRI HEAD FINDINGS

Brain: Advanced cerebral atrophy. Ventricular enlargement consistent
with the level of atrophy.

Negative for acute infarct. Extensive chronic microvascular ischemic
change in the white matter and pons. Multiple areas of chronic
microhemorrhage including in the pons bilaterally and in both
cerebral hemispheres.

Enhancing vein in the left parietal lobe compatible with
developmental venous anomaly

2 mm enhancement in the central pons. This likely is vascular
enhancement from capillary telangiectasia.

Vascular: Normal arterial flow voids.

Skull and upper cervical spine: No focal skeletal lesion. ACDF
cervical spine.

Other: None

MRI ORBITS FINDINGS

Orbits: No orbital mass or edema. Optic nerve normal bilaterally.
Extraocular muscles normal bilaterally. No enhancing lesion in the
orbit. Optic chiasm normal. Pituitary not enlarged.

Visualized sinuses: Clear

Soft tissues: No soft tissue swelling or mass
IMPRESSION: 1. No acute intracranial abnormality.
2. Extensive atrophy. Extensive chronic microvascular ischemia.
Multiple areas of chronic microhemorrhage in the brain including the
pons
3. Developmental venous anomaly left parietal lobe. Punctate
enhancement central pons most likely capillary telangiectasia
4. Negative orbit

## 2021-11-09 IMAGING — CT CT HEAD W/O CM
3 series · 16 of 47 positions shown, 19 images · non-contrast
Comparison: [DATE]

CLINICAL DATA: Dizziness

EXAM:
CT HEAD WITHOUT CONTRAST
TECHNIQUE: Contiguous axial images were obtained from the base of the skull
through the vertex without intravenous contrast.

[Series 2: head wo · axial · 0.47mm/px · z∈[-198,-73]mm · 10 of 31 slices shown, 13 images]
[im 3/31  brain]
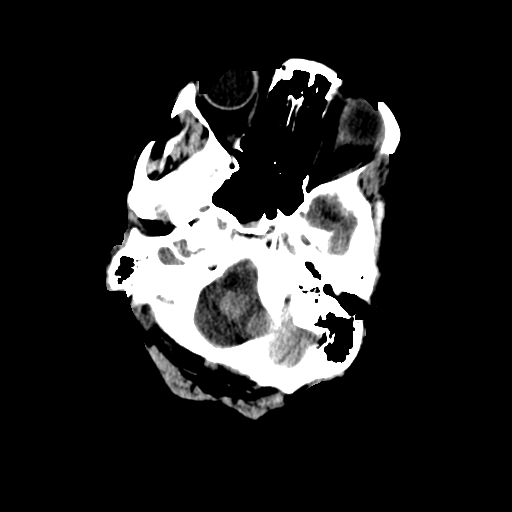
[im 3/31  bone]
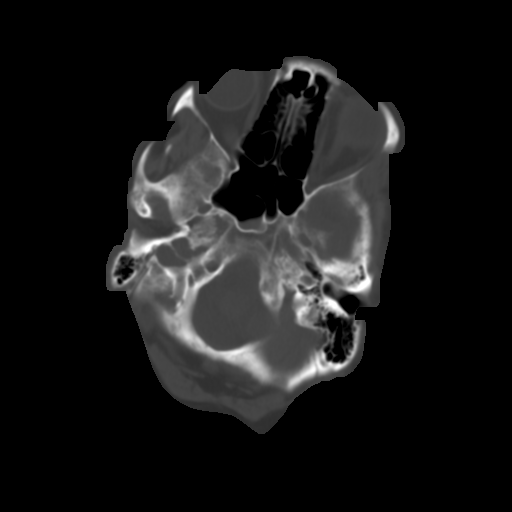
[im 6/31  brain]
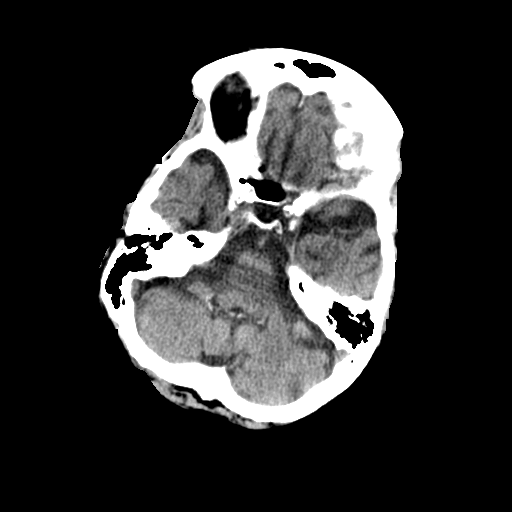
[im 9/31  brain]
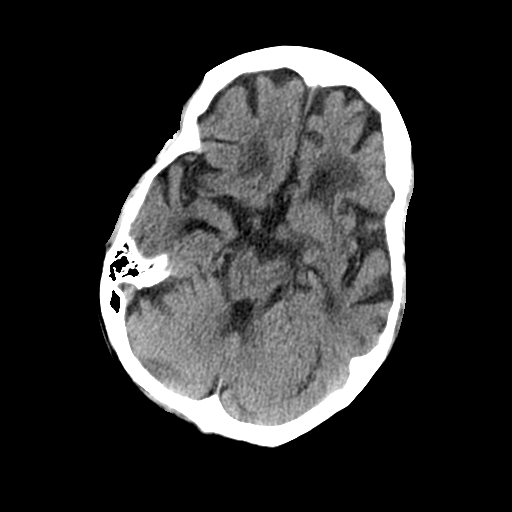
[im 11/31  brain]
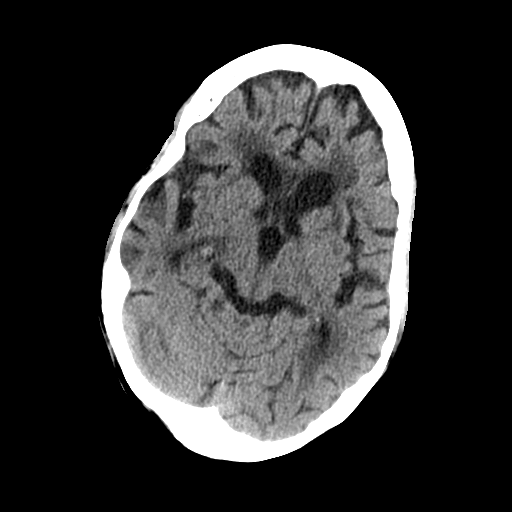
[im 14/31  brain]
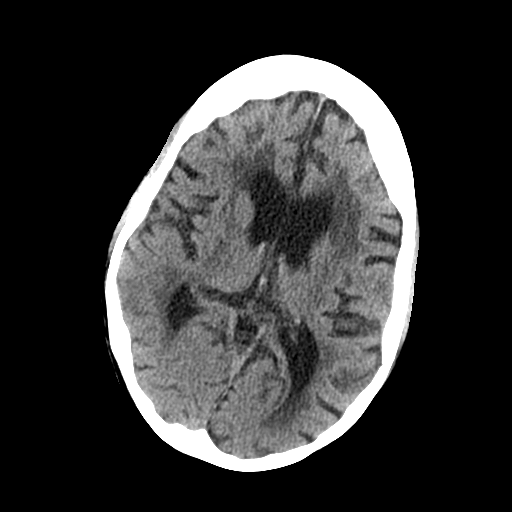
[im 14/31  bone]
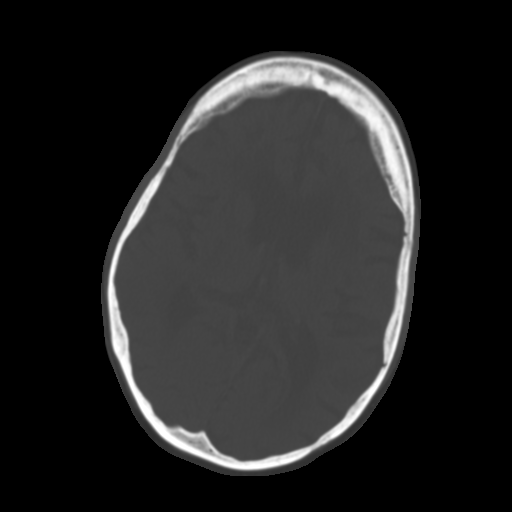
[im 17/31  brain]
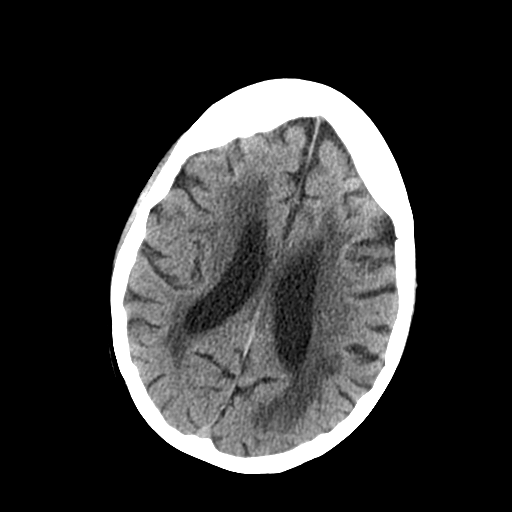
[im 20/31  brain]
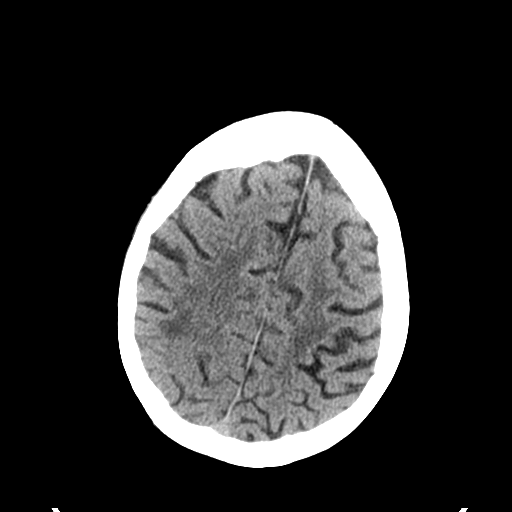
[im 23/31  brain]
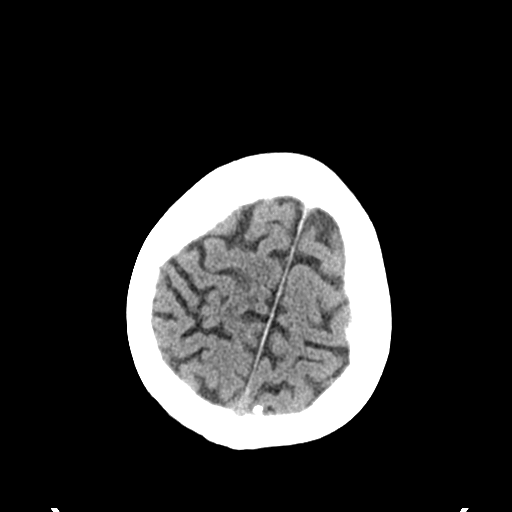
[im 25/31  brain]
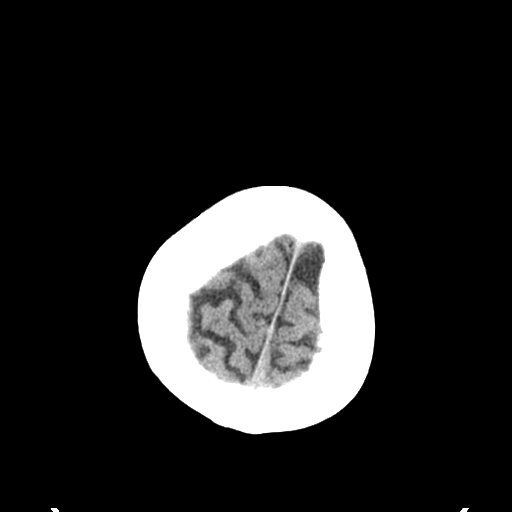
[im 25/31  bone]
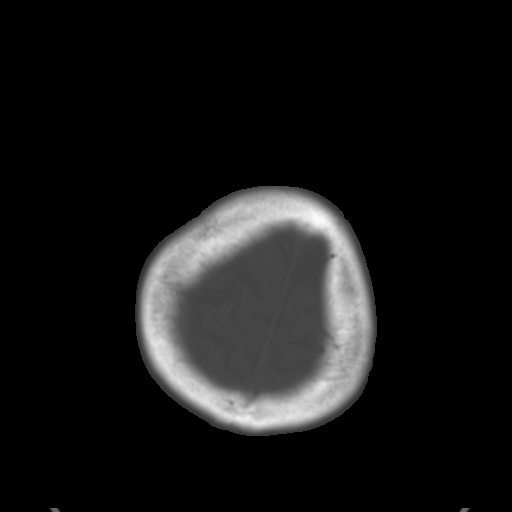
[im 28/31  brain]
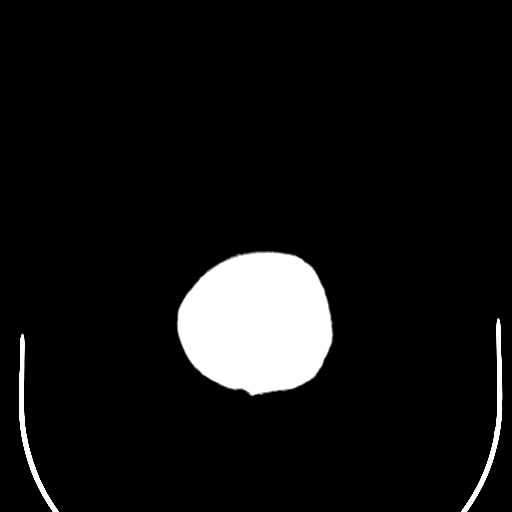

[Series 4: coronal soft tissue · coronal · 0.33mm/px · 3 of 74 slices shown]
[im 25/74  brain]
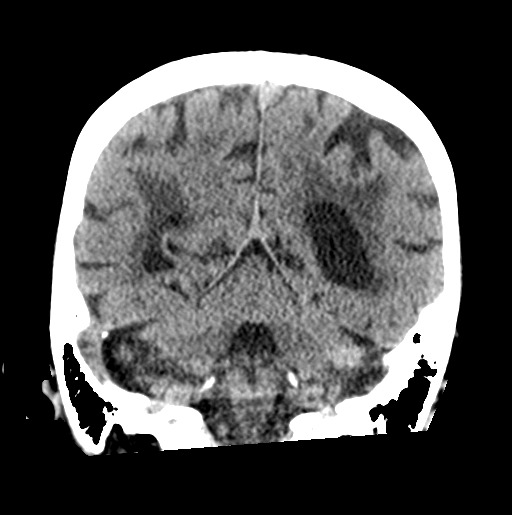
[im 33/74  brain]
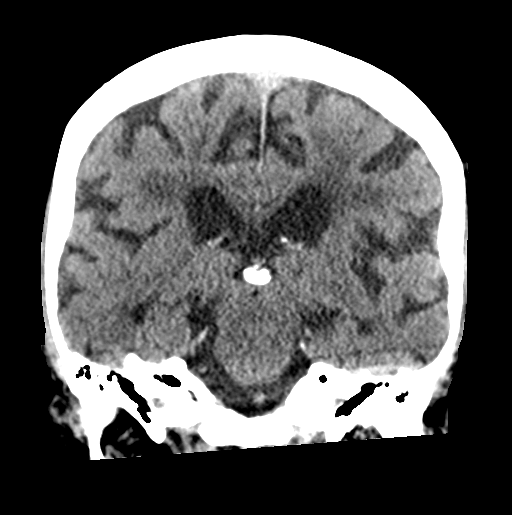
[im 41/74  brain]
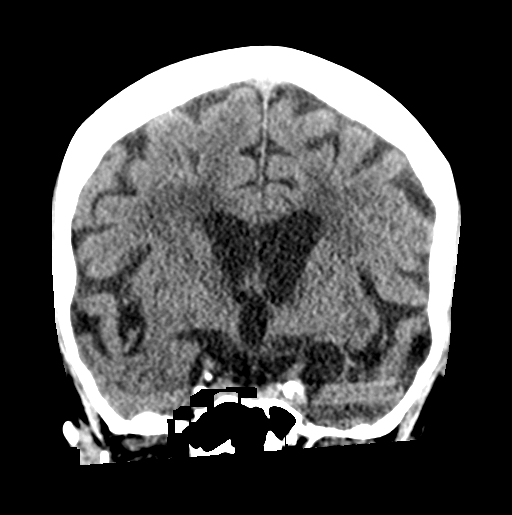

[Series 5: sagittal soft tissue · sagittal · 0.34mm/px · 3 of 58 slices shown]
[im 22/58  brain]
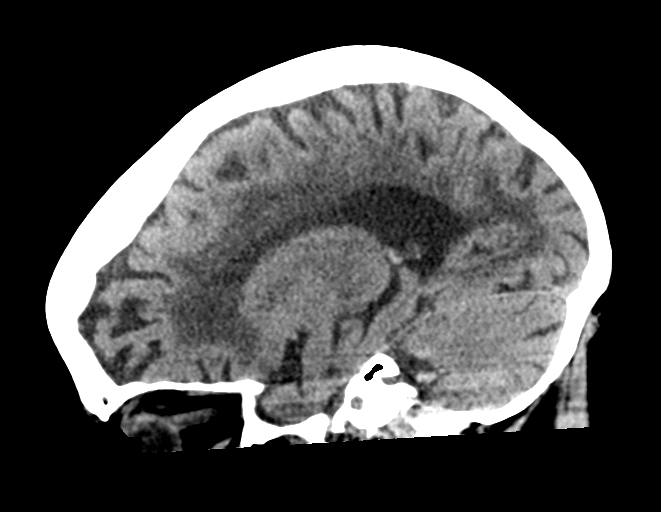
[im 29/58  brain]
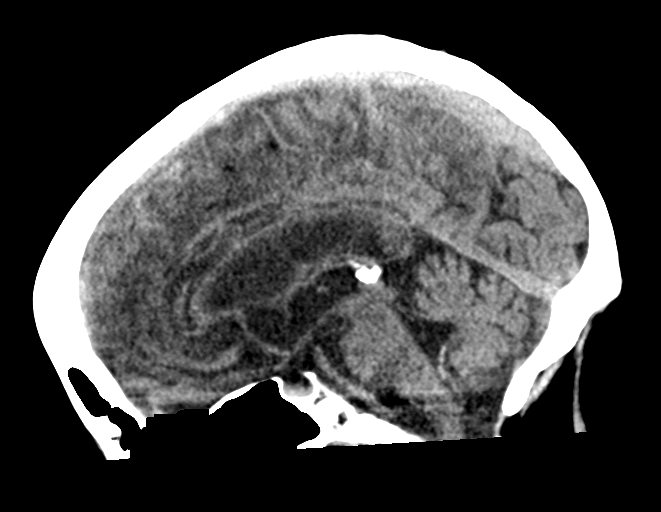
[im 37/58  brain]
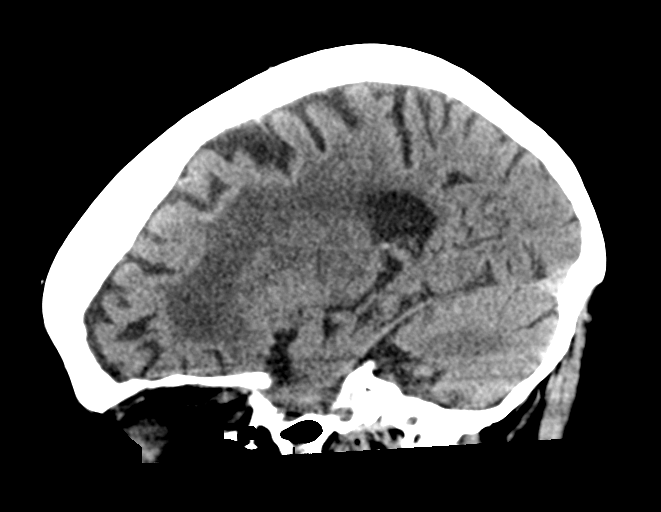

[16 of 47 positions shown; findings below may reference images not displayed]

FINDINGS: Brain: No evidence of acute infarction, hemorrhage, extra-axial
collection, ventriculomegaly, or mass effect. Generalized cerebral
atrophy. Periventricular white matter low attenuation likely
secondary to microangiopathy.

Vascular: Cerebrovascular atherosclerotic calcifications are noted.

Skull: Negative for fracture or focal lesion.

Sinuses/Orbits: Visualized portions of the orbits are unremarkable.
Visualized portions of the paranasal sinuses are unremarkable.
Visualized portions of the mastoid air cells are unremarkable.

Other: None.
IMPRESSION: 1. No acute intracranial pathology.
2. Chronic microvascular disease and cerebral atrophy.

## 2021-11-09 MED ORDER — ACETAMINOPHEN 160 MG/5ML PO SOLN: 650.0000 mg | ORAL | Status: DC | PRN

## 2021-11-09 MED ORDER — LORAZEPAM 2 MG/ML IJ SOLN
0.0000 mg | Freq: Three times a day (TID) | INTRAMUSCULAR | Status: DC
Start: 2021-11-11 — End: 2021-11-10

## 2021-11-09 MED ORDER — FOLIC ACID 1 MG PO TABS
1.0000 mg | ORAL_TABLET | Freq: Every day | ORAL | Status: DC
  Administered 2021-11-09 – 2021-11-11 (×3): 1 mg via ORAL
  Filled 2021-11-09 (×3): qty 1

## 2021-11-09 MED ORDER — LORAZEPAM 2 MG/ML IJ SOLN: 1.0000 mg | INTRAMUSCULAR | Status: DC | PRN

## 2021-11-09 MED ORDER — ACETAMINOPHEN 325 MG PO TABS
650.0000 mg | ORAL_TABLET | ORAL | Status: DC | PRN
  Administered 2021-11-09: 650 mg via ORAL
  Filled 2021-11-09: qty 2

## 2021-11-09 MED ORDER — HEPARIN SODIUM (PORCINE) 5000 UNIT/ML IJ SOLN: 5000.0000 [IU] | Freq: Three times a day (TID) | INTRAMUSCULAR | Status: DC

## 2021-11-09 MED ORDER — ACETAMINOPHEN 650 MG RE SUPP: 650.0000 mg | RECTAL | Status: DC | PRN

## 2021-11-09 MED ORDER — ADULT MULTIVITAMIN W/MINERALS CH
1.0000 | ORAL_TABLET | Freq: Every day | ORAL | Status: DC
  Administered 2021-11-09 – 2021-11-11 (×3): 1 via ORAL
  Filled 2021-11-09 (×3): qty 1

## 2021-11-09 MED ORDER — THIAMINE HCL 100 MG PO TABS
100.0000 mg | ORAL_TABLET | Freq: Every day | ORAL | Status: DC
  Administered 2021-11-09 – 2021-11-11 (×3): 100 mg via ORAL
  Filled 2021-11-09 (×3): qty 1

## 2021-11-09 MED ORDER — GADOBUTROL 1 MMOL/ML IV SOLN
5.0000 mL | Freq: Once | INTRAVENOUS | Status: AC | PRN
  Administered 2021-11-09: 5 mL via INTRAVENOUS

## 2021-11-09 MED ORDER — LORAZEPAM 1 MG PO TABS: 1.0000 mg | ORAL_TABLET | ORAL | Status: DC | PRN

## 2021-11-09 MED ORDER — NICOTINE 21 MG/24HR TD PT24
21.0000 mg | MEDICATED_PATCH | Freq: Every day | TRANSDERMAL | Status: DC
  Filled 2021-11-09 (×2): qty 1

## 2021-11-09 MED ORDER — THIAMINE HCL 100 MG/ML IJ SOLN: 100.0000 mg | Freq: Every day | INTRAMUSCULAR | Status: DC

## 2021-11-09 MED ORDER — LORAZEPAM 2 MG/ML IJ SOLN: 0.0000 mg | INTRAMUSCULAR | Status: DC

## 2021-11-09 MED ORDER — STROKE: EARLY STAGES OF RECOVERY BOOK
Freq: Once | Status: AC
  Filled 2021-11-09: qty 1

## 2021-11-09 MED ORDER — SODIUM CHLORIDE 0.9 % IV SOLN: INTRAVENOUS | Status: DC

## 2021-11-09 MED ORDER — SENNOSIDES-DOCUSATE SODIUM 8.6-50 MG PO TABS: 1.0000 | ORAL_TABLET | Freq: Every evening | ORAL | Status: DC | PRN

## 2021-11-09 MED ORDER — ASPIRIN 81 MG PO CHEW
81.0000 mg | CHEWABLE_TABLET | Freq: Once | ORAL | Status: AC
  Administered 2021-11-09: 81 mg via ORAL
  Filled 2021-11-09: qty 1

## 2021-11-09 NOTE — ED Provider Notes (Signed)
[facility] DEPT Provider Note   CSN: [id number] Arrival date & time: 11/09/21  1356     History Chief Complaint  Patient presents with   Weakness   Diplopia    Holly Phelps is a 60 y.o. female.  HPI     60 year old female comes in with chief complaint of weakness and diplopia.  She reports that she is not taking any medications. Our records indicate that she has history of hypertension and patient admits to smoking.  She reports 2-day history of difficulty in walking because of binocular double vision.  Her double vision is worse for father objects compared to closer and also worse with left-sided gaze.  No eye pain.  She denies any focal numbness, weakness.  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   Macrocytic anemia 11/18/2015   Nausea and vomiting 11/18/2015   Leucocytosis 11/17/2015   Protein-calorie malnutrition, severe 11/17/2015   Hypomagnesemia 09/08/2015   Hypokalemia 09/07/2015   Tobacco use disorder 09/07/2015   Cocaine abuse ([place]) 05/27/2015   Anemia 01/29/2013   Nausea vomiting and diarrhea 01/26/2013   Esophagitis 01/26/2013   Anxiety 03/01/2008   Alcohol abuse 03/01/2008   Elevated LFTs 03/01/2008    Past Surgical History:  Procedure Laterality Date   BACK SURGERY     x 2   COLONOSCOPY N/A 03/13/2013   Procedure: COLONOSCOPY;  Surgeon: Leighton Ruff, MD;  Location: [facility] ENDOSCOPY;  Service: Endoscopy;   Laterality: N/A;   LAPAROSCOPIC APPENDECTOMY       OB History   No obstetric history on file.     Family History  Problem Relation Age of Onset   Stroke Father    Stroke Brother    Stroke Brother     Social History   Tobacco Use   Smoking status: Every Day    Packs/day: 0.20    Types: Cigarettes   Smokeless tobacco: Never   Tobacco comments:    3 cigarettes per day  Substance Use Topics   Alcohol use: Yes    Alcohol/week: 10.0 standard drinks    Types: 10 Glasses of wine per week    Comment: working to reduce this due to illness   Drug use: Yes    Types: Cocaine    Comment: rarely     Home Medications Prior to Admission medications   Medication Sig Start Date End Date Taking? Authorizing Provider  allopurinol (ZYLOPRIM) 100 MG tablet Take 1 tablet (100 mg total) by mouth daily. 10/26/20   Maximiano Coss, NP  famotidine (PEPCID) 20 MG tablet Take 1 tablet (20 mg total) by mouth 2 (two) times daily. 04/29/21   Carlisle Cater, PA-C  indomethacin (INDOCIN) 50 MG capsule Take 1 capsule (50 mg total) by mouth 3 (three) times daily with meals. 10/25/20   Maximiano Coss, NP  Multiple Vitamins-Minerals (MULTIVITAMIN ADULTS PO) Take by mouth.    [provider]  naproxen (NAPROSYN) 500 MG tablet Take 1 tablet (500 mg total) by mouth 2 (two) times daily with a meal. 12/21/16   Emeterio Reeve, DO  promethazine (PHENERGAN) 25 MG suppository Place 1 suppository (25 mg total) rectally every 6 (six) hours as needed for nausea or vomiting. 04/29/21   Carlisle Cater, PA-C    Allergies    Codeine, Flexeril [cyclobenzaprine], Sulfa antibiotics, and Synthroid [levothyroxine]  Review of Systems   Review of Systems  Constitutional:  Positive for activity change.  Eyes:  Positive for visual disturbance. Negative for pain.  Respiratory:  Negative for shortness of breath.   Cardiovascular:  Negative for chest pain.  Gastrointestinal:  Negative for nausea and vomiting.   Neurological:  Negative for dizziness, tremors, seizures, syncope, facial asymmetry, speech difficulty, weakness, light-headedness, numbness and headaches.  All other systems reviewed and are negative.  Physical Exam Updated Vital Signs BP (!) 170/96   Pulse 88   Temp 97.6 F (36.4 C) (Oral)   Resp 18   Ht 5\' 2"  (1.575 m)   Wt 49.9 kg   SpO2 100%   BMI 20.12 kg/m   Physical Exam Vitals and nursing note reviewed.  Constitutional:      Appearance: She is well-developed.  HENT:     Head: Atraumatic.  Eyes:     Pupils: Pupils are equal, round, and reactive to light.     Comments: Patient has disconjugate gaze with abnormality noted of the R eye L with lateral gaze.  The left pupil is also noted to be slightly skewed to the left lateral side when patient is requested to focus on finger over the midline  Horizontal diplopia worse with left lateral gaze.  Diplopia resolves with patient covering the contralateral eyes and with pinhole test.  Cardiovascular:     Rate and Rhythm: Normal rate.  Pulmonary:     Effort: Pulmonary effort is normal.  Musculoskeletal:     Cervical back: Normal range of motion and neck supple.  Skin:    General: Skin is warm and dry.  Neurological:     Mental Status: She is alert and oriented to person, place, and time.    ED Results / Procedures / Treatments   Labs (all labs ordered are listed, but only abnormal results are displayed) Labs Reviewed  COMPREHENSIVE METABOLIC PANEL - Abnormal; Notable for the following components:      Result Value   CO2 20 (*)    AST 78 (*)    ALT 48 (*)    Total Bilirubin 1.4 (*)    Anion gap 18 (*)    All other components within normal limits  CBC WITH DIFFERENTIAL/PLATELET - Abnormal; Notable for the following components:   RBC 3.51 (*)    MCV 107.4 (*)    MCH 35.0 (*)    Monocytes Absolute 1.1 (*)    All other components within normal limits  RESP PANEL BY RT-PCR (FLU A&B, COVID) ARPGX2  URINALYSIS,  ROUTINE W REFLEX MICROSCOPIC  HIV ANTIBODY (ROUTINE TESTING W REFLEX)  HEMOGLOBIN A1C  LIPID PANEL  CBC  CREATININE, SERUM  RAPID URINE DRUG SCREEN, HOSP PERFORMED  ETHANOL  CBG MONITORING, ED    EKG None  Radiology CT Head Wo Contrast  Result Date: 11/09/2021 CLINICAL DATA:  Dizziness EXAM: CT HEAD WITHOUT CONTRAST TECHNIQUE: Contiguous axial images were obtained from the base of the skull through the vertex without intravenous contrast. COMPARISON:  10/06/2021 FINDINGS: Brain: No evidence of acute infarction, hemorrhage, extra-axial  collection, ventriculomegaly, or mass effect. Generalized cerebral atrophy. Periventricular white matter low attenuation likely secondary to microangiopathy. Vascular: Cerebrovascular atherosclerotic calcifications are noted. Skull: Negative for fracture or focal lesion. Sinuses/Orbits: Visualized portions of the orbits are unremarkable. Visualized portions of the paranasal sinuses are unremarkable. Visualized portions of the mastoid air cells are unremarkable. Other: None. IMPRESSION: 1. No acute intracranial pathology. 2. Chronic microvascular disease and cerebral atrophy. Electronically Signed   By: Kathreen Devoid M.D.   On: 11/09/2021 15:00    Procedures Procedures   Medications Ordered in ED Medications   stroke: mapping our early stages of recovery book (has no administration in time range)  0.9 %  sodium chloride infusion (has no administration in time range)  acetaminophen (TYLENOL) tablet 650 mg (has no administration in time range)    Or  acetaminophen (TYLENOL) 160 MG/5ML solution 650 mg (has no administration in time range)    Or  acetaminophen (TYLENOL) suppository 650 mg (has no administration in time range)  senna-docusate (Senokot-S) tablet 1 tablet (has no administration in time range)  heparin injection 5,000 Units (has no administration in time range)  aspirin chewable tablet 81 mg (81 mg Oral Given 11/09/21 1633)    ED Course  I  have reviewed the triage vital signs and the nursing notes.  Pertinent labs & imaging results that were available during my care of the patient were reviewed by me and considered in my medical decision making (see chart for details).    MDM Rules/Calculators/A&P                          60 year old female with history of hypertension, half a pack smoking who is not taking her medications for BP comes in with chief complaint of double vision.  Last seen normal 2 days ago.  She has INO of her right eye.  There is also skew of the left pupil laterally.   Clinically does not fit a specific nerve palsy.  Discussed case with neurology team, they suspect a stroke.  Dr. Quinn Axe recommends that we admit the patient to [facility] and order MRI orbits with and without contrast and MRI brain with and without contrast..  Patient made aware of the plan of admission to [facility].     Final Clinical Impression(s) / ED Diagnoses Final diagnoses:  INO (internuclear ophthalmoplegia), right  Diplopia    Rx / DC Orders ED Discharge Orders     None        Varney Biles, MD 11/09/21 1656

## 2021-11-09 NOTE — ED Notes (Signed)
Signing pad not working - pt gave consent to be transferred to [facility]

## 2021-11-09 NOTE — Consult Note (Signed)
NEUROLOGY CONSULTATION NOTE   Date of service: November 09, 2021 Patient Name: Holly Phelps MRN:  [id number] DOB:  03-04-1961 Reason for consult: "Double vision" Requesting Provider: Allie Bossier, MD _ _ _   _ __   _ __ _ _  __ __   _ __   __ _  History of Present Illness  Holly Phelps is a 60 y.o. female with PMH significant for GERD, hypertension, hyperlipidemia, hypothyroidism, prior history of tuberculosis, ETOH use, cocaine use, protein calorie malnutrition who presents with double vision.  She is a poor historian but reports that she woke up with double vision 3 days ago. No prior history of strokes. She smokes, does not follow with physicians. Does not take medications at home.   ROS   Constitutional Denies weight loss, fever and chills.   HEENT Denies changes in vision and hearing.   Respiratory Denies SOB and cough.   CV Denies palpitations and CP   GI Denies abdominal pain, nausea, vomiting and diarrhea.   GU Denies dysuria and urinary frequency.   MSK Denies myalgia and joint pain.   Skin Denies rash and pruritus.   Neurological Denies headache and syncope.   Psychiatric Denies recent changes in mood. Denies anxiety and depression.    Past History   Past Medical History:  Diagnosis Date   Allergy    Anemia    Calcification of aorta (HCC)    Fatty liver    GERD (gastroesophageal reflux disease)    Headache    Hypertension    Hypothyroidism    Tuberculosis    as baby   Past Surgical History:  Procedure Laterality Date   BACK SURGERY     x 2   COLONOSCOPY N/A 03/13/2013   Procedure: COLONOSCOPY;  Surgeon: Leighton Ruff, MD;  Location: [facility];  Service: Endoscopy;  Laterality: N/A;   LAPAROSCOPIC APPENDECTOMY     Family History  Problem Relation Age of Onset   Stroke Father    Stroke Brother    Stroke Brother    Social History   Socioeconomic History   Marital status: Married    Spouse name: [name]   Number of children: 0   Years of  education: [employer] education level: Not on file  Occupational History   Occupation: retired Teaching laboratory technician work  Tobacco Use   Smoking status: Every Day    Packs/day: 0.20    Types: Cigarettes   Smokeless tobacco: Never   Tobacco comments:    3 cigarettes per day  Substance and Sexual Activity   Alcohol use: Yes    Alcohol/week: 10.0 standard drinks    Types: 10 Glasses of wine per week    Comment: working to reduce this due to illness   Drug use: Yes    Types: Cocaine    Comment: rarely    Sexual activity: Not Currently  Other Topics Concern   Not on file  Social History Narrative   Lives with her husband.   No children.   Social Determinants of Health   Financial Resource Strain: Not on file  Food Insecurity: Not on file  Transportation Needs: Not on file  Physical Activity: Not on file  Stress: Not on file  Social Connections: Not on file   Allergies  Allergen Reactions   Codeine Nausea And Vomiting   Flexeril [Cyclobenzaprine] Itching   Sulfa Antibiotics     Pt does not recall what the reaction was,been too long   Synthroid [  Levothyroxine] Rash    Medications   Medications Prior to Admission  Medication Sig Dispense Refill Last Dose   acetaminophen (TYLENOL) 500 MG tablet Take 1,000 mg by mouth every 6 (six) hours as needed.   11/09/2021   famotidine (PEPCID) 20 MG tablet Take 1 tablet (20 mg total) by mouth 2 (two) times daily. (Patient taking differently: Take 20 mg by mouth daily as needed for heartburn.) 30 tablet 0 Past Week   Multiple Vitamins-Minerals (MULTIVITAMIN ADULTS PO) Take 1 tablet by mouth daily.   Past Week   Polyethylene Glycol 400 (VISINE DRY EYE RELIEF) 1 % SOLN Place 1 drop into both eyes daily as needed (dry eyes).   11/09/2021   promethazine (PHENERGAN) 25 MG suppository Place 1 suppository (25 mg total) rectally every 6 (six) hours as needed for nausea or vomiting. 12 each 0 Past Week   allopurinol (ZYLOPRIM) 100 MG tablet Take 1  tablet (100 mg total) by mouth daily. (Patient not taking: Reported on 11/09/2021) 90 tablet 3 Not Taking   indomethacin (INDOCIN) 50 MG capsule Take 1 capsule (50 mg total) by mouth 3 (three) times daily with meals. (Patient not taking: Reported on 11/09/2021) 21 capsule 0 Not Taking   naproxen (NAPROSYN) 500 MG tablet Take 1 tablet (500 mg total) by mouth 2 (two) times daily with a meal. (Patient not taking: Reported on 11/09/2021) 30 tablet 0 Not Taking     Vitals   Vitals:   11/09/21 1404 11/09/21 1630 11/09/21 1930 11/09/21 2000  BP: (!) 172/95 (!) 170/96 (!) 149/88 (!) 159/101  Pulse: 93 88 89   Resp:  18 20 (!) 21  Temp:      TempSrc:      SpO2: 100% 100% 100% 100%  Weight: 49.9 kg     Height: 5\' 2"  (1.575 m)        Body mass index is 20.12 kg/m.  Physical Exam   General: Laying comfortably in bed; in no acute distress.  HENT: Normal oropharynx and mucosa. Normal external appearance of ears and nose.  Neck: Supple, no pain or tenderness  CV: No JVD. No peripheral edema.  Pulmonary: Symmetric Chest rise. Normal respiratory effort.  Abdomen: Soft to touch, non-tender.  Ext: No cyanosis, edema, or deformity  Skin: No rash. Normal palpation of skin.   Musculoskeletal: Normal digits and nails by inspection. No clubbing.   Neurologic Examination  Mental status/Cognition: Alert, oriented to self, place, month and year, good attention.  Speech/language: Fluent, comprehension intact, object naming intact, repetition intact.  Cranial nerves:   CN II Pupils equal and reactive to light, no VF deficits    CN III,IV,VI EOM intact, no gaze preference or deviation, no nystagmus. Left eye exotropia in primary gaze, with Left INO on exam.   CN V normal sensation in V1, V2, and V3 segments bilaterally    CN VII no asymmetry, no nasolabial fold flattening    CN VIII normal hearing to speech    CN IX & X normal palatal elevation, no uvular deviation    CN XI 5/5 head turn and 5/5  shoulder shrug bilaterally    CN XII midline tongue protrusion    Motor:  Muscle bulk: poor, tone normal, pronator drift none tremor none Mvmt Root Nerve  Muscle Right Left Comments  SA C5/6 Ax Deltoid 5 5   EF C5/6 Mc Biceps 5 5   EE C6/7/8 Rad Triceps 5 5   WF C6/7 Med FCR  WE C7/8 PIN ECU     F Ab C8/T1 U ADM/FDI 5 5   HF L1/2/3 Fem Illopsoas 5 5   KE L2/3/4 Fem Quad 5 5   DF L4/5 D Peron Tib Ant 5 5   PF S1/2 Tibial Grc/Sol 5 5    Reflexes:  Right Left Comments  Pectoralis      Biceps (C5/6) 2 2   Brachioradialis (C5/6) 2 2    Triceps (C6/7) 2 2    Patellar (L3/4) 2 2    Achilles (S1)      Hoffman      Plantar     Jaw jerk    Sensation:  Light touch Intact throughout   Pin prick    Temperature    Vibration   Proprioception    Coordination/Complex Motor:  - Finger to Nose intact BL - Heel to shin intact BL - Rapid alternating movement are normal - Gait: Deferred for patient safety.  Labs   CBC:  Recent Labs  Lab 11/09/21 1421  WBC 8.5  NEUTROABS 6.0  HGB 12.3  HCT 37.7  MCV 107.4*  PLT 606    Basic Metabolic Panel:  Lab Results  Component Value Date   NA 136 11/09/2021   K 3.6 11/09/2021   CO2 20 (L) 11/09/2021   GLUCOSE 77 11/09/2021   BUN 13 11/09/2021   CREATININE 0.73 11/09/2021   CALCIUM 9.4 11/09/2021   GFRNONAA >60 11/09/2021   GFRAA 103 10/25/2020   Lipid Panel:  Lab Results  Component Value Date   LDLCALC 117 (H) 01/27/2013   HgbA1c:  Lab Results  Component Value Date   HGBA1C 4.6 11/16/2015   Urine Drug Screen:     Component Value Date/Time   LABOPIA NONE DETECTED 11/17/2015 0901   COCAINSCRNUR NONE DETECTED 11/17/2015 0901   LABBENZ NONE DETECTED 11/17/2015 0901   AMPHETMU NONE DETECTED 11/17/2015 0901   THCU NONE DETECTED 11/17/2015 0901   LABBARB NONE DETECTED 11/17/2015 0901    Alcohol Level     Component Value Date/Time   ETH <5 06/22/2016 1117    CT Head without contrast(Personally reviewed): CTH was  negative for a large hypodensity concerning for a large territory infarct or hyperdensity concerning for an ICH  MR Brain and Orbit(personally reviewed): 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  MRA head and neck(Personally reviewed): pending Impression   AYRIEL TEXIDOR is a 60 y.o. female with PMH significant for GERD, hypertension, hyperlipidemia, hypothyroidism, prior history of tuberculosis, ETOH use, cocaine use, protein calorie malnutrition who presents with double vision. Found to have L eye exotropia and left INO. Suspect a combination of potential oculomotor and MLF stroke in the central pons/midbrain with sparring of the edinger westphal nucleus. Brainstem strokes due to their small size can be missed on the MRIs.  Would recommend stroke workup.  Primary Diagnosis:  Other cerebral infarction due to occlusion of stenosis of small artery.  Secondary Diagnosis: Essential (primary) hypertension  Recommendations  Plan:  - Frequent Neuro checks per stroke unit protocol - Recommend brain imaging with MRI Brain without contrast - Recommend Vascular imaging with MRA Angio Head without contrast and US Carotid doppler - Recommend obtaining TTE - Recommend obtaining Lipid panel with LDL - Please start statin if LDL > 70 - Recommend HbA1c - Antithrombotic - Aspirin 81mg  daily. - Recommend DVT ppx - SBP goal - permissive hypertension first 24 h < 220/110. Held home meds.  - Recommend Telemetry monitoring for arrythmia - Recommend bedside  swallow screen prior to PO intake. - Stroke education booklet - Recommend PT/OT/SLP consult - Recommend Urine Tox screen.  ______________________________________________________________________  Plan discussed with Dr. Alcario Drought.  Thank you for the opportunity to take part in the care of this patient. If you have any further questions, please contact the neurology consultation attending.  Signed,  [place] Pager Number [phone] _ _ _   _ __   _ __ _ _  __ __   _ __   __ _

## 2021-11-09 NOTE — ED Notes (Signed)
Patient transported to MRI 

## 2021-11-09 NOTE — ED Notes (Signed)
Pt has returned from MRI. 

## 2021-11-09 NOTE — ED Triage Notes (Signed)
Patient called EMS for weakness x 1 week with unable to eat or drink over the past few days. Reports double vision x 3 days. Reports generalized body aches. Pt A&0x4.

## 2021-11-09 NOTE — H&P (Signed)
[facility] History and Physical  Holly Phelps YYQ:[id number] DOB: 1961/09/02 DOA: 11/09/2021  Referring physician:  PCP: Maximiano Coss, NP   Chief Complaint: Stroke  HPI: Holly Phelps is a 60 y.o. WF, PMHx Principal Problem: Anxiety, ETOH abuse, Anemia, Cocaine abuse (HCC),Tobacco use disorder,Protein-calorie malnutrition severe,Essential hypertension, Noncompliance with medication regimen.  States has not seen a physician in over a year.  States was not taking medication for hypertension because it really was not bothering her.  States last use of alcohol a couple days ago drinks bourbon~3 drinks.  Has not used cocaine in some time.    Review of Systems:  Covid vaccination; vaccinated 2/3  Constitutional:  No weight loss, night sweats, Fevers, chills, fatigue.  HEENT:  No headaches, Difficulty swallowing,Tooth/dental problems,Sore throat,  No sneezing, itching, ear ache, nasal congestion, post nasal drip,  Cardio-vascular:  No chest pain, Orthopnea, PND, swelling in lower extremities, anasarca, dizziness, palpitations  GI:  No heartburn, indigestion, abdominal pain, nausea, vomiting, diarrhea, change in bowel habits, loss of appetite  Resp:  No shortness of breath with exertion or at rest. No excess mucus, no productive cough, No non-productive cough, No coughing up of blood.No change in color of mucus.No wheezing.No chest wall deformity  Skin:  no rash or lesions.  GU:  no dysuria, change in color of urine, no urgency or frequency. No flank pain.  Musculoskeletal:  No joint pain or swelling. No decreased range of motion. No back pain.  Psych:  No change in mood or affect. No depression or anxiety. No memory loss.   Past Medical History:  Diagnosis Date   Allergy    Anemia    Calcification of aorta (HCC)    Fatty liver    GERD (gastroesophageal reflux disease)    Headache    Hypertension    Hypothyroidism    Tuberculosis    as baby   Past Surgical  History:  Procedure Laterality Date   BACK SURGERY     x 2   COLONOSCOPY N/A 03/13/2013   Procedure: COLONOSCOPY;  Surgeon: Leighton Ruff, MD;  Location: [facility];  Service: Endoscopy;  Laterality: N/A;   LAPAROSCOPIC APPENDECTOMY     Social History:  reports that she has been smoking cigarettes. She has been smoking an average of .2 packs per day. She has never used smokeless tobacco. She reports current alcohol use of about 10.0 standard drinks per week. She reports current drug use. Drug: Cocaine.  Allergies  Allergen Reactions   Codeine Nausea And Vomiting   Flexeril [Cyclobenzaprine] Itching   Sulfa Antibiotics     Pt does not recall what the reaction was,been too long   Synthroid [Levothyroxine] Rash    Family History  Problem Relation Age of Onset   Stroke Father    Stroke Brother    Stroke Brother     Prior to Admission medications   Medication Sig Start Date End Date Taking? Authorizing Provider  allopurinol (ZYLOPRIM) 100 MG tablet Take 1 tablet (100 mg total) by mouth daily. 10/26/20   Maximiano Coss, NP  famotidine (PEPCID) 20 MG tablet Take 1 tablet (20 mg total) by mouth 2 (two) times daily. 04/29/21   Carlisle Cater, PA-C  indomethacin (INDOCIN) 50 MG capsule Take 1 capsule (50 mg total) by mouth 3 (three) times daily with meals. 10/25/20   Maximiano Coss, NP  Multiple Vitamins-Minerals (MULTIVITAMIN ADULTS PO) Take by mouth.    [provider]  naproxen (NAPROSYN) 500 MG tablet Take 1 tablet (  500 mg total) by mouth 2 (two) times daily with a meal. 12/21/16   Emeterio Reeve, DO  promethazine (PHENERGAN) 25 MG suppository Place 1 suppository (25 mg total) rectally every 6 (six) hours as needed for nausea or vomiting. 04/29/21   Carlisle Cater, PA-C     Consultants:  Neurology Dr. Su Monks  Procedures/Significant Events:  12/22 CT head W0 contrast: Negative for acute pathology 12/22 MRI orbits W/W0 contrast:No acute intracranial  abnormality. -Extensive atrophy.  -Extensive chronic microvascular ischemia. 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   VENTILATOR SETTINGS:    Cultures 12/22 influenza A/B negative 12/22 SARS coronavirus negative   Antimicrobials:    Devices    LINES / TUBES:      Continuous Infusions:  sodium chloride      Physical Exam: Vitals:   11/09/21 1401 11/09/21 1403 11/09/21 1404 11/09/21 1630  BP:  (!) 175/102 (!) 172/95 (!) 170/96  Pulse:  94 93 88  Resp:  18  18  Temp:  97.6 F (36.4 C)    TempSrc:  Oral    SpO2: 99% 100% 100% 100%  Weight:   49.9 kg   Height:   5\' 2"  (1.575 m)     Wt Readings from Last 3 Encounters:  11/09/21 49.9 kg  10/06/21 49.9 kg  10/25/20 46.1 kg    General: A/O x4, No acute respiratory distress, cachectic Eyes: negative scleral hemorrhage, negative anisocoria, negative icterus, Pupils are equal, round, and reactive to light. disconjugate gaze with abnormality noted of the R eye L with lateral gaze.The left pupil is also noted to be slightly skewed to the left lateral side when patient is requested to focus on finger over the midline.Horizontal diplopia worse with left lateral gaze.  Diplopia resolves with patient covering the contralateral eyes and with pinhole test. ENT: Negative Runny nose, negative gingival bleeding, Neck:  Negative scars, masses, torticollis, lymphadenopathy, JVD Lungs: Clear to auscultation bilaterally without wheezes or crackles Cardiovascular: Regular rate and rhythm without murmur gallop or rub normal S1 and S2 Abdomen: negative abdominal pain, nondistended, positive soft, bowel sounds, no rebound, no ascites, no appreciable mass Extremities: No significant  cyanosis, clubbing, or edema bilateral lower extremities Skin: Negative rashes, lesions, ulcers Psychiatric:  Negative depression, negative anxiety, negative fatigue, negative mania  Central nervous system:  Cranial nerves II through XII intact, tongue/uvula midline, all extremities muscle strength 5/5, sensation intact throughout, finger nose finger bilateral past-pointing, quick finger touch bilateral difficult to accomplish, heel to shin bilateral within normal limits, negative dysarthria, negative expressive aphasia, negative receptive aphasia.        Labs on Admission:  Basic Metabolic Panel: Recent Labs  Lab 11/09/21 1421  NA 136  K 3.6  CL 98  CO2 20*  GLUCOSE 77  BUN 13  CREATININE 0.73  CALCIUM 9.4   Liver Function Tests: Recent Labs  Lab 11/09/21 1421  AST 78*  ALT 48*  ALKPHOS 107  BILITOT 1.4*  PROT 8.0  ALBUMIN 4.5   No results for input(s): LIPASE, AMYLASE in the last 168 hours. No results for input(s): AMMONIA in the last 168 hours. CBC: Recent Labs  Lab 11/09/21 1421  WBC 8.5  NEUTROABS 6.0  HGB 12.3  HCT 37.7  MCV 107.4*  PLT 294   Cardiac  Enzymes: No results for input(s): CKTOTAL, CKMB, CKMBINDEX, TROPONINI in the last 168 hours.  BNP (last 3 results) No results for input(s): BNP in the last 8760 hours.  ProBNP (last 3 results) No results for input(s): PROBNP in the last 8760 hours.  CBG: Recent Labs  Lab 11/09/21 1407  GLUCAP 74    Radiological Exams on Admission: CT Head Wo Contrast  Result Date: 11/09/2021 CLINICAL DATA:  Dizziness EXAM: CT HEAD WITHOUT CONTRAST TECHNIQUE: Contiguous axial images were obtained from the base of the skull through the vertex without intravenous contrast. COMPARISON:  10/06/2021 FINDINGS: Brain: No evidence of acute infarction, hemorrhage, extra-axial collection, ventriculomegaly, or mass effect. Generalized cerebral atrophy. Periventricular white matter low attenuation likely secondary to  microangiopathy. Vascular: Cerebrovascular atherosclerotic calcifications are noted. Skull: Negative for fracture or focal lesion. Sinuses/Orbits: Visualized portions of the orbits are unremarkable. Visualized portions of the paranasal sinuses are unremarkable. Visualized portions of the mastoid air cells are unremarkable. Other: None. IMPRESSION: 1. No acute intracranial pathology. 2. Chronic microvascular disease and cerebral atrophy. Electronically Signed   By: Kathreen Devoid M.D.   On: 11/09/2021 15:00    EKG: Independently reviewed.   Assessment/Plan Principal Problem:   Stroke [facility]) Active Problems:   Anxiety   Alcohol abuse   Anemia   Cocaine abuse (HCC)   Tobacco use disorder   Protein-calorie malnutrition, severe   Essential hypertension   Noncompliance with medication regimen  Stroke -DC prophylactic anticoagulant until stroke team sees patient - WILL NOT allow permissive HTN.  SBP goal 140-160 -Transfer patient to [facility] per neurology.  Will be seen by stroke team - Neurochecks per stroke protocol - Fall precautions - Lipid panel pending - Hemoglobin A1c pending -PT/OT consult: CIR vs SNF - Patient has diplopia with vision field cut may require ophthalmologist  Chronic Microhemorrhage in the Brain including the pons -See stroke  Essential HTN -Lisinopril 2.5 mg daily - See stroke  EtOH abuse/cocaine abuse -Urine drug screen pending - [facility] protocol  Tobacco abuse -Nicotine patch  Anxiety - See [facility]WA protocol  Anemia -Currently not anemic however will monitor closely  Severe protein calorie malnutrition -After patient transferred to [facility] would obtain nutrition consult  Noncompliance with medication regimen -Counseled patient extensively concerning her noncompliance with medication and failure to visit physician contributed to/cause stroke.    Code Status: Full (DVT Prophylaxis: SCD Family Communication: 12/22 husband at bedside for discussion  of plan of care all questions answered Status is: Inpatient    Dispo: The patient is from: Home              Anticipated d/c is to: Home              Anticipated d/c date is: 3 days              Patient currently is not medically stable to d/c.     Data Reviewed: Care during the described time interval was provided by me .  I have reviewed this patient's available data, including medical history, events of note, physical examination, and all test results as part of my evaluation.   The patient is critically ill with multiple organ systems failure and requires high complexity decision making for assessment and support, frequent evaluation and titration of therapies, application of advanced monitoring technologies and extensive interpretation of multiple databases. Critical Care Time devoted to patient care services described in this note  Time spent: 70 minutes   Kadeisha Betsch, [facility]

## 2021-11-09 NOTE — ED Notes (Signed)
[facility] has been called to arrange transport.

## 2021-11-09 NOTE — ED Notes (Signed)
Pt placed on bedpan to attempt to provide urine sample. She did not urinate. [facility] now at bedside to transport the patient to [facility].

## 2021-11-09 NOTE — ED Provider Notes (Signed)
Emergency Medicine Provider Triage Evaluation Note  Holly Phelps , a 60 y.o. female  was evaluated in triage.  Pt complains of 1 week of generalized weakness.  She states that over the past 3 days she has reported double vision.  She states that she has had blurry and double vision not associated with headache, fever, nausea or vomiting.  She does endorse mild dizziness but denies vertigo or falls.  She does endorse having difficulty walking and has needed help from her husband to get around.  Endorses head trauma over a month ago.  She was seen by primary care at that time.  She does note that she has been in bed for the past 3 days.  She states that she had not peed in the past 2 days until this morning.  Endorses dysuria..  Denies fevers chest pain, shortness of breath, abdominal pain.  Review of Systems  Positive: See above  negative:   Physical Exam  BP (!) 172/95   Pulse 93   Temp 97.6 F (36.4 C) (Oral)   Resp 18   Ht 5\' 2"  (1.575 m)   Wt 49.9 kg   SpO2 100%   BMI 20.12 kg/m  Gen:   Awake, in mild distress.  Alert and oriented Resp:  Normal effort, lungs clear to auscultation bilaterally MSK:   Moves extremities without difficulty  Other:  Left eye is deviated to the left and down.  She reports this is not normal for her there is nystagmus on exam.  No other focal neurological deficits.  No pronator drift.  Medical Decision Making  Medically screening exam initiated at 2:16 PM.  Appropriate orders placed.  Holly Phelps was informed that the remainder of the evaluation will be completed by another provider, this initial triage assessment does not replace that evaluation, and the importance of remaining in the ED until their evaluation is complete.     Mickie Hillier, PA-C 11/09/21 [place], Ankit, MD 11/10/21 [phone]

## 2021-11-10 ENCOUNTER — Encounter (HOSPITAL_COMMUNITY): Payer: 59

## 2021-11-10 ENCOUNTER — Inpatient Hospital Stay (HOSPITAL_COMMUNITY): Payer: 59

## 2021-11-10 DIAGNOSIS — I6389 Other cerebral infarction: Secondary | ICD-10-CM

## 2021-11-10 DIAGNOSIS — I639 Cerebral infarction, unspecified: Secondary | ICD-10-CM

## 2021-11-10 LAB — LIPID PANEL
Cholesterol: 276 mg/dL — ABNORMAL HIGH (ref 0–200)
HDL: 126 mg/dL (ref >40)
LDL Cholesterol: 126 mg/dL — ABNORMAL HIGH (ref 0–99)
Total CHOL/HDL Ratio: 2.2 RATIO
Triglycerides: 118 mg/dL (ref <150)
VLDL: 24 mg/dL (ref 0–40)

## 2021-11-10 LAB — GLUCOSE, CAPILLARY: Glucose-Capillary: 73 mg/dL (ref 70–99)

## 2021-11-10 LAB — RAPID URINE DRUG SCREEN, HOSP PERFORMED
Amphetamines: NOT DETECTED
Barbiturates: NOT DETECTED
Benzodiazepines: NOT DETECTED
Cocaine: POSITIVE — AB
Opiates: NOT DETECTED
Tetrahydrocannabinol: POSITIVE — AB

## 2021-11-10 LAB — ECHOCARDIOGRAM COMPLETE
Area-P 1/2: 2.42 cm2
Height: 62 in
S' Lateral: 1.3 cm
Weight: 1760 oz

## 2021-11-10 LAB — HEMOGLOBIN A1C
Hgb A1c MFr Bld: 4.4 % — ABNORMAL LOW (ref 4.8–5.6)
Mean Plasma Glucose: 79.58 mg/dL

## 2021-11-10 IMAGING — MR MR MRA HEAD W/O CM
3 series · 17 of 48 positions shown · non-contrast
Comparison: Brain MRI [DATE].  MRI of the orbits [DATE].

CLINICAL DATA: Provided history: Neuro deficit, acute, stroke
suspected.

EXAM:
MRA HEAD WITHOUT CONTRAST
TECHNIQUE: Angiographic images of the Circle of Willis were acquired using MRA
technique without intravenous contrast.

[Series 3: (id) mt fs · axial · 1.4mm · 0.43mm/px · z∈[-79,+9]mm · 15 of 136 slices shown]
[im 1/136]
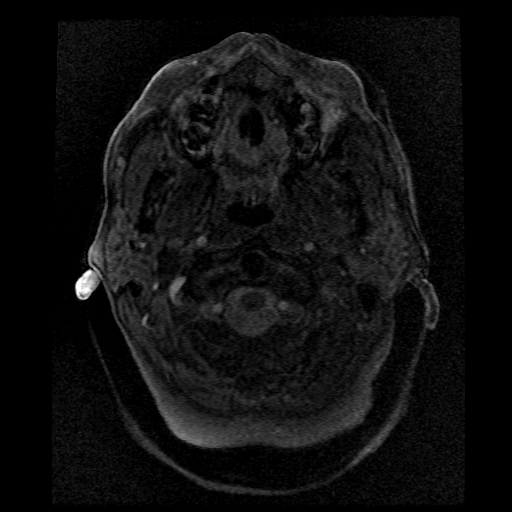
[im 4/136]
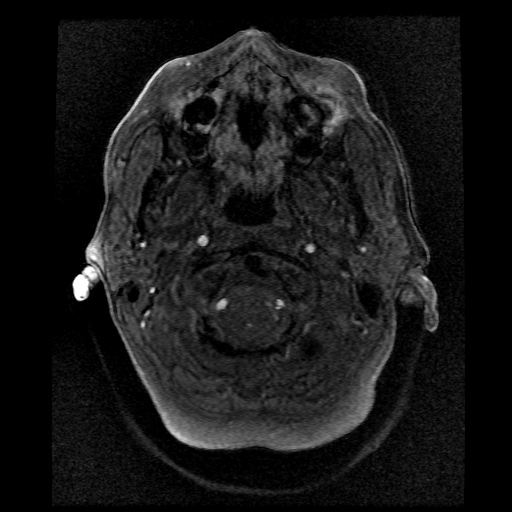
[im 7/136]
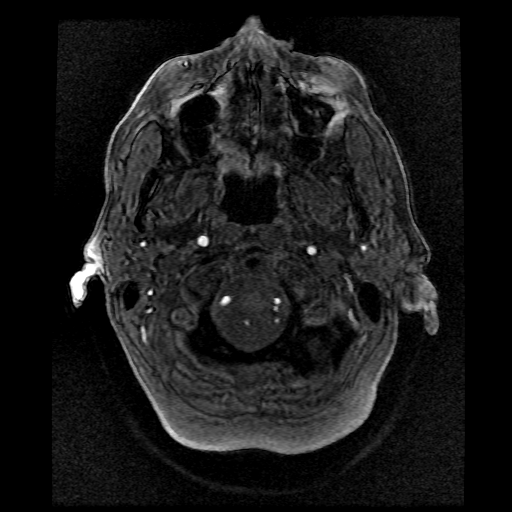
[im 10/136]
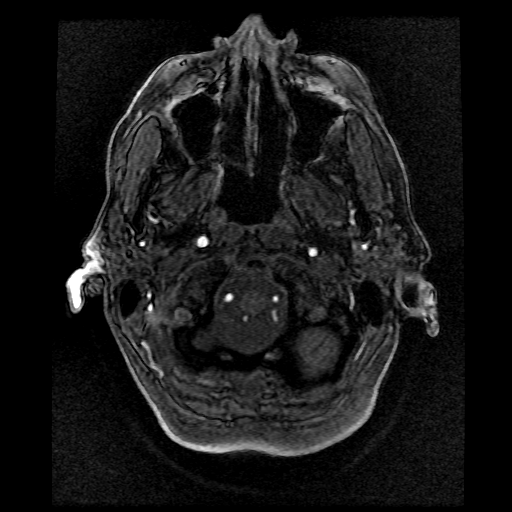
[im 13/136]
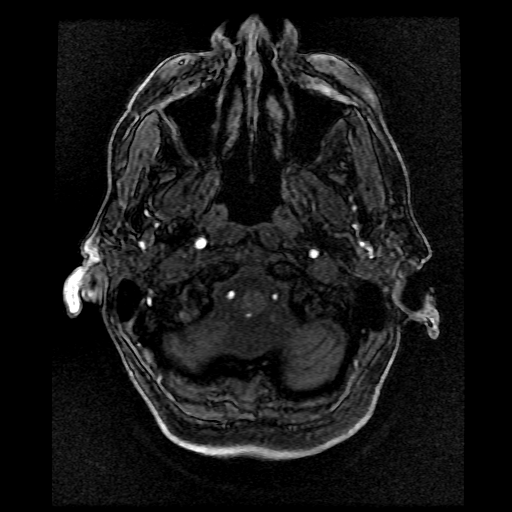
[im 22/136]
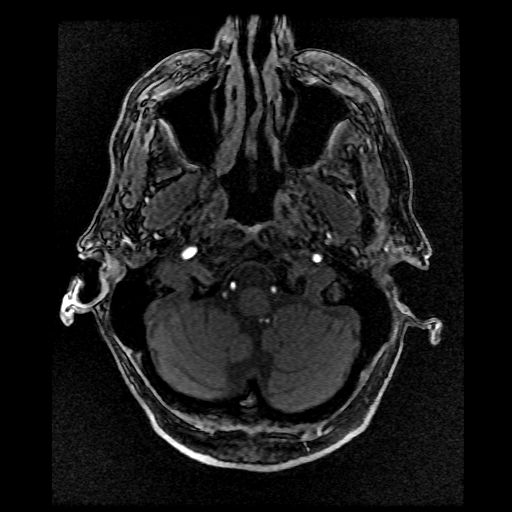
[im 25/136]
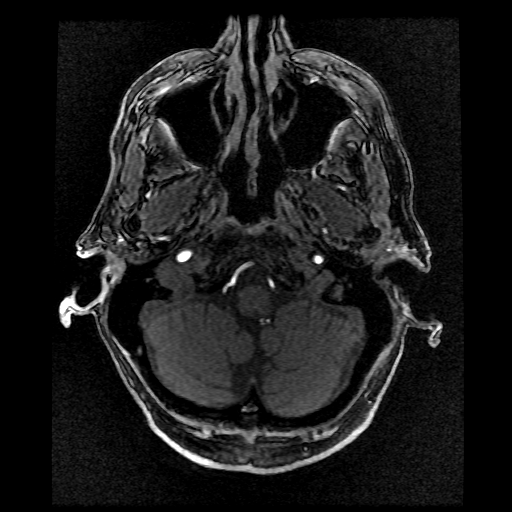
[im 43/136]
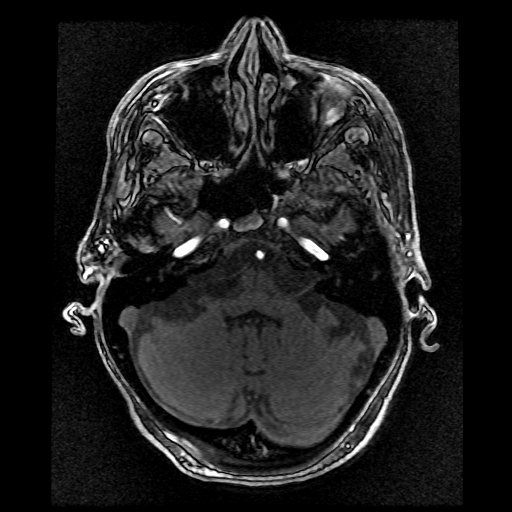
[im 61/136]
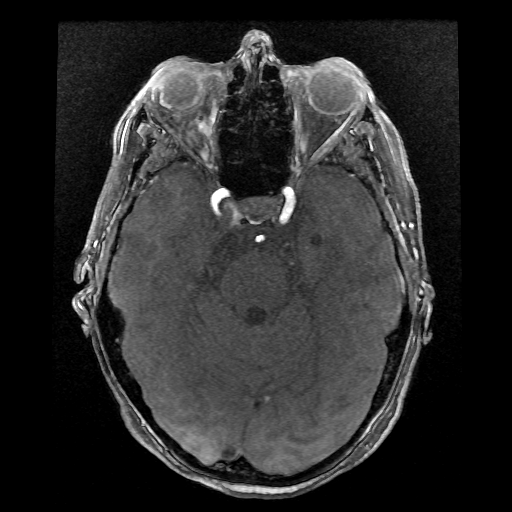
[im 70/136]
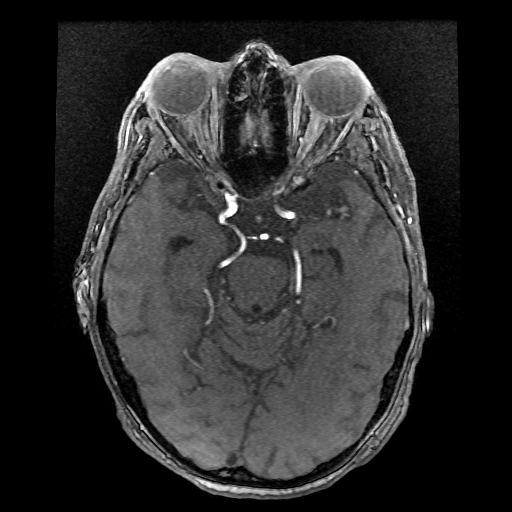
[im 76/136]
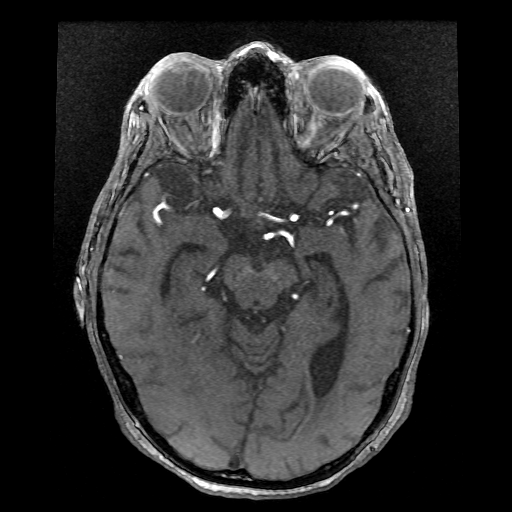
[im 94/136]
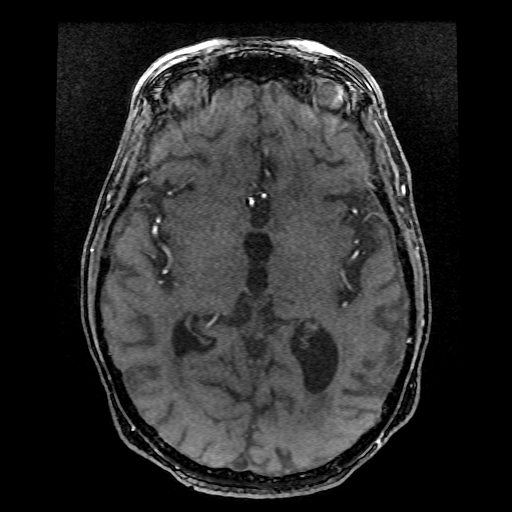
[im 112/136]
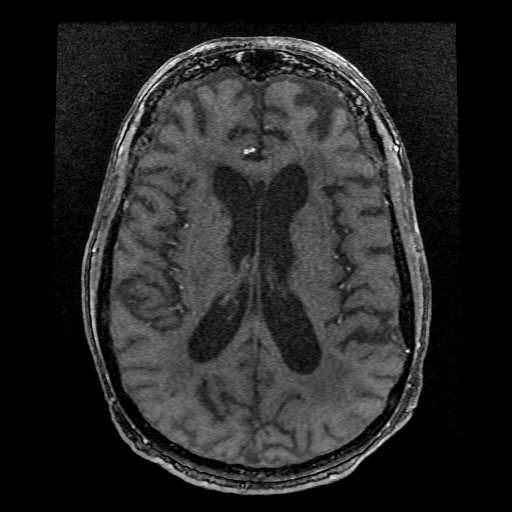
[im 115/136]
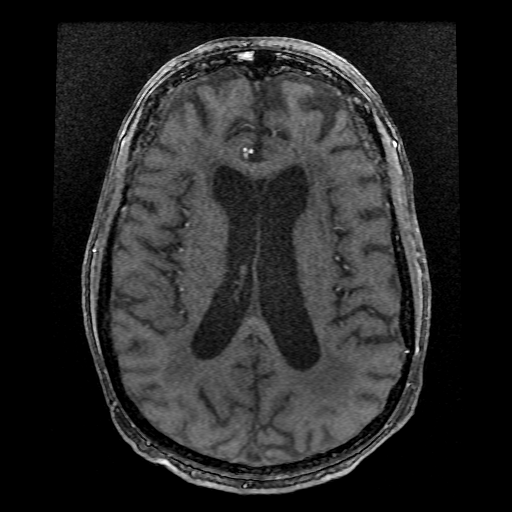
[im 130/136]
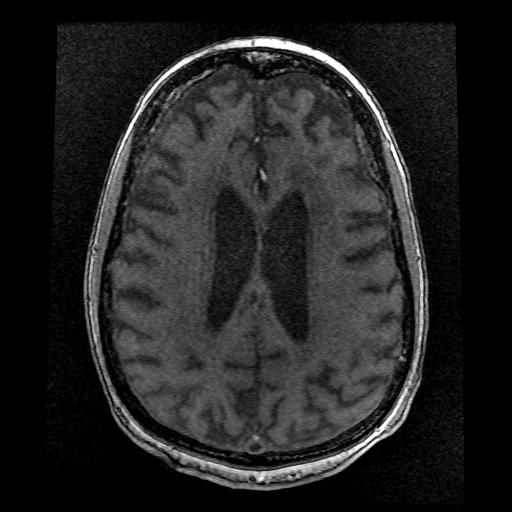

[Series 300: col:(id) mt fs · axial · 1.4mm · 0.43mm/px · 1 of 1 slices shown]
[im 1/1]
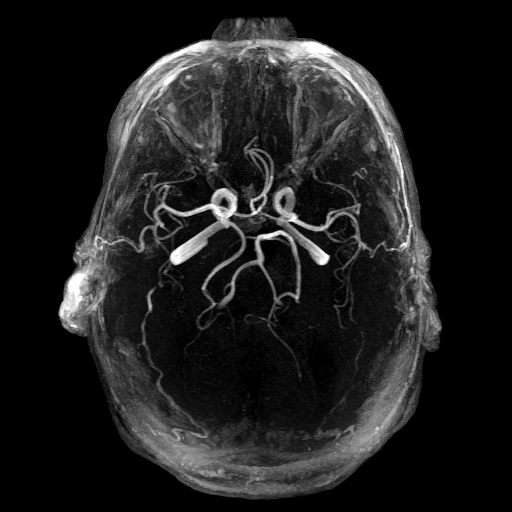

[Series 303: processed images · axial · 1.4mm · 0.21mm/px · 1 of 1 slices shown]
[im 1/1]
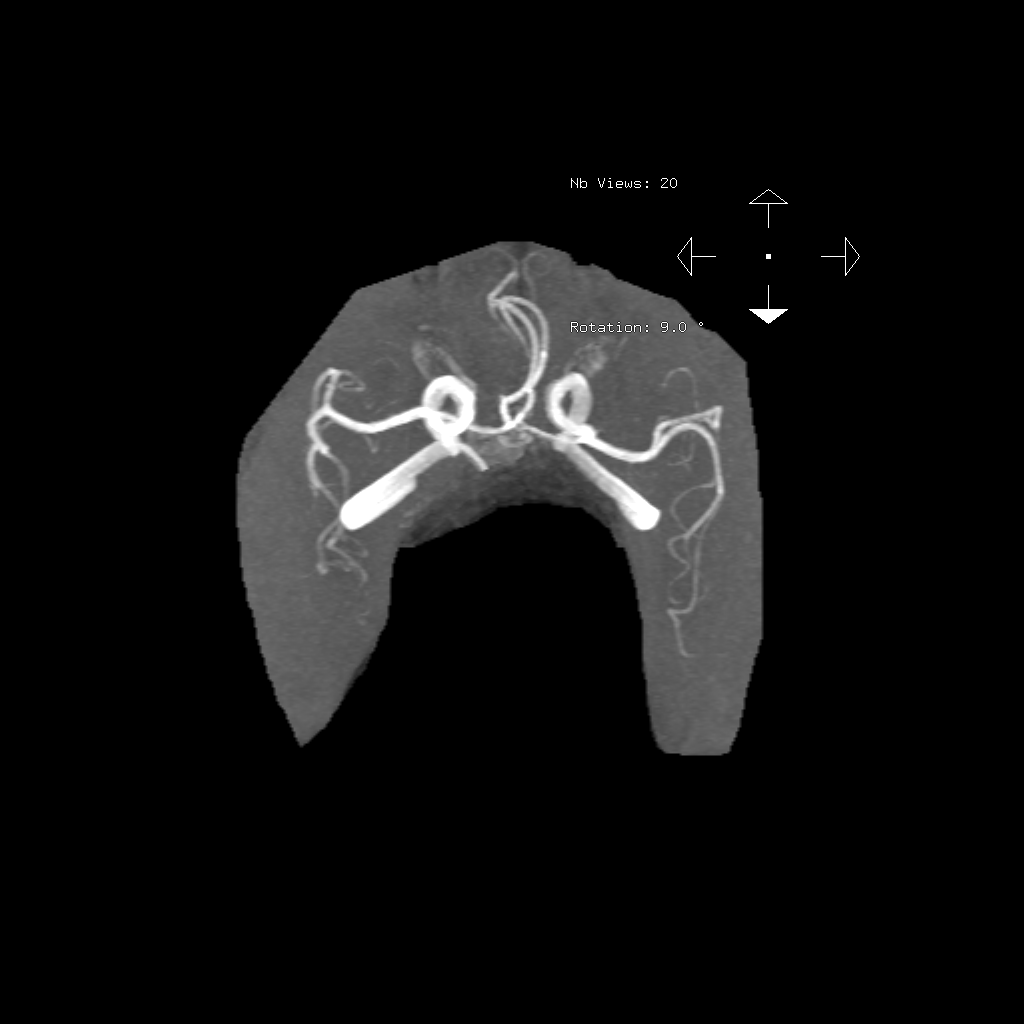

[17 of 48 positions shown; findings below may reference images not displayed]

FINDINGS: Mildly motion degraded exam.

Anterior circulation:

The intracranial internal carotid arteries are patent. The M1 middle
cerebral arteries are patent. Atherosclerotic irregularity of the M2
and more distal middle cerebral artery vessels, bilaterally. No M2
proximal branch occlusion or high-grade proximal stenosis is
identified. The anterior cerebral arteries are patent. No
intracranial aneurysm is identified.

Posterior circulation:

The intracranial vertebral arteries are patent. The basilar artery
is patent. The posterior cerebral arteries are patent. The right PCA
is fetal in origin. The left posterior communicating artery is
diminutive or absent.

Anatomic variants: As described.
IMPRESSION: No intracranial large vessel occlusion or proximal high-grade
arterial stenosis.

Atherosclerotic irregularity of the M2 and more distal MCA vessels,
bilaterally.

## 2021-11-10 IMAGING — MR MR MRA NECK WO/W CM
5 of 9 series · 19 of 48 positions shown · IV contrast (gadavist)
Comparison: Same day MRA head [DATE]. MRI of the orbits
[DATE]. MRI of the head [DATE].

CLINICAL DATA: Neuro deficit, acute, stroke suspected.

EXAM:
MRA NECK WITHOUT AND WITH CONTRAST
TECHNIQUE: Multiplanar and multiecho pulse sequences of the neck were obtained
without and with intravenous contrast. Angiographic images of the
neck were obtained using MRA technique without and with intravenous
contrast.
CONTRAST:  4.9mL GADAVIST GADOBUTROL 1 MMOL/ML IV SOLN

[Series 3: ax (id) · axial · 2.8mm · 0.47mm/px · z∈[-111,+81]mm · 8 of 140 slices shown]
[im 1/140]
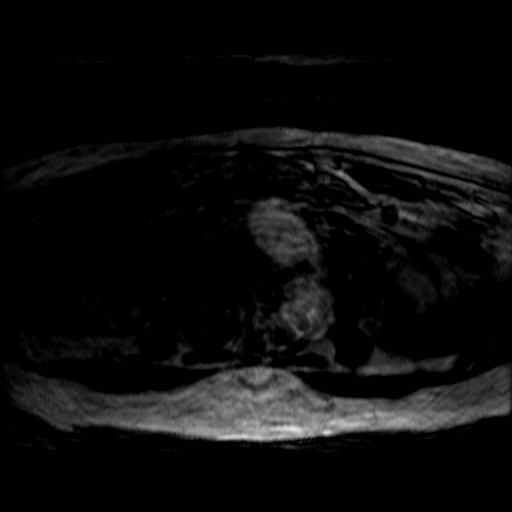
[im 16/140]
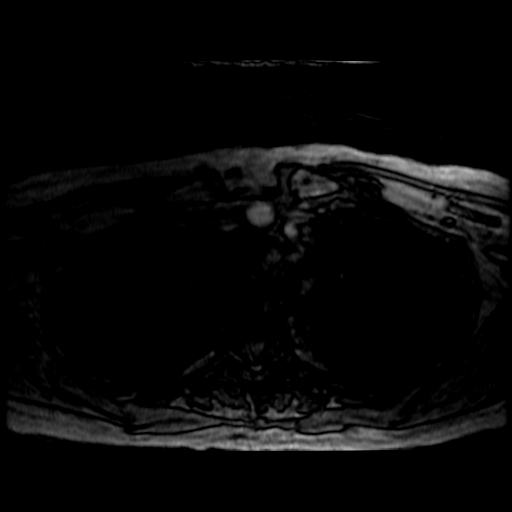
[im 47/140]
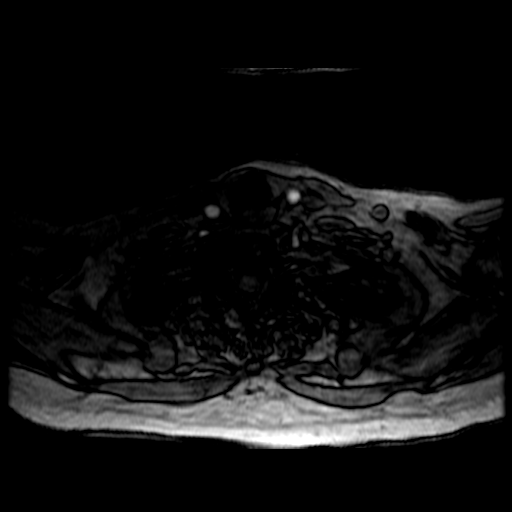
[im 62/140]
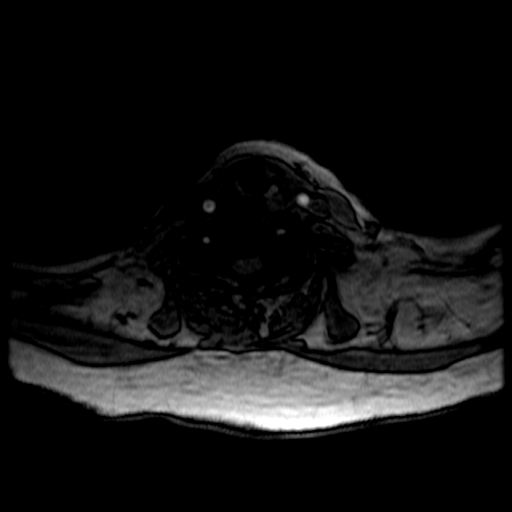
[im 78/140]
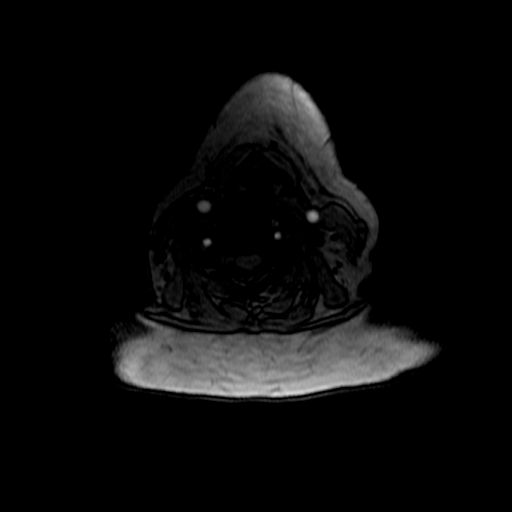
[im 93/140]
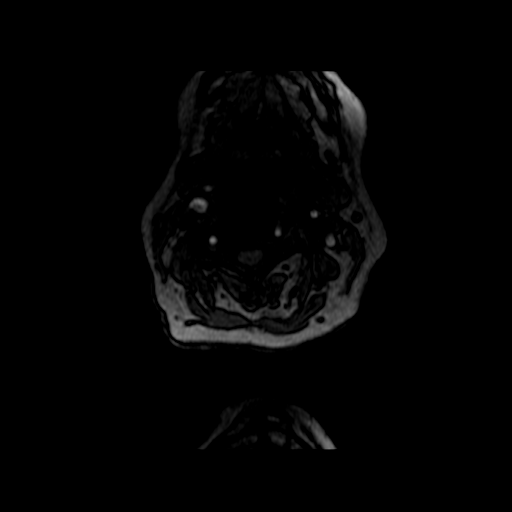
[im 124/140]
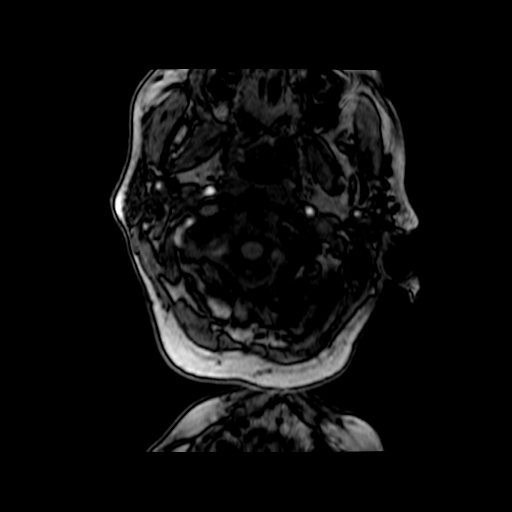
[im 140/140]
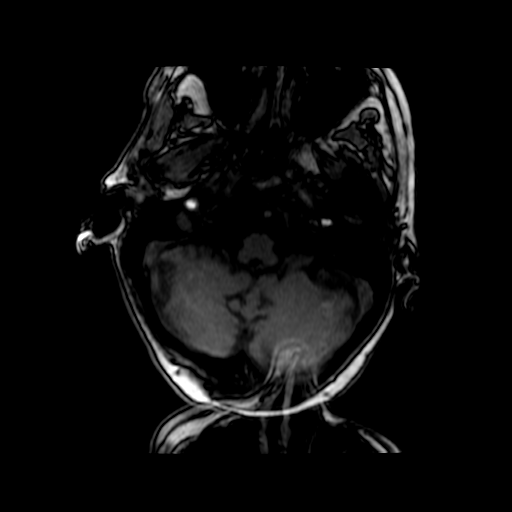

[Series 300: col:ax (id) · axial · 2.8mm · 0.47mm/px · 1 of 1 slices shown]
[im 1/1]
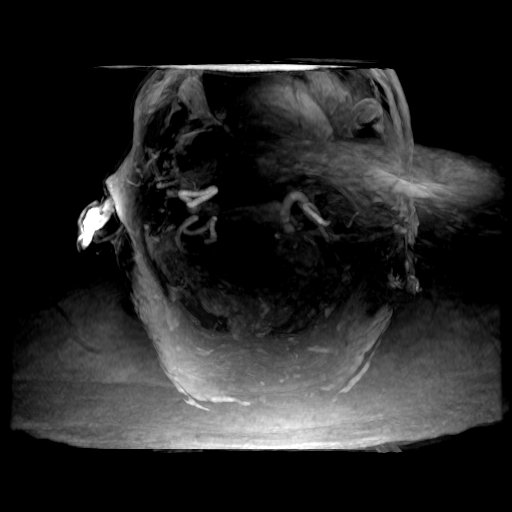

[Series 301: pjn:ax (id) · sagittal · 2.8mm · 0.47mm/px · 1 of 17 slices shown]
[im 1/17]
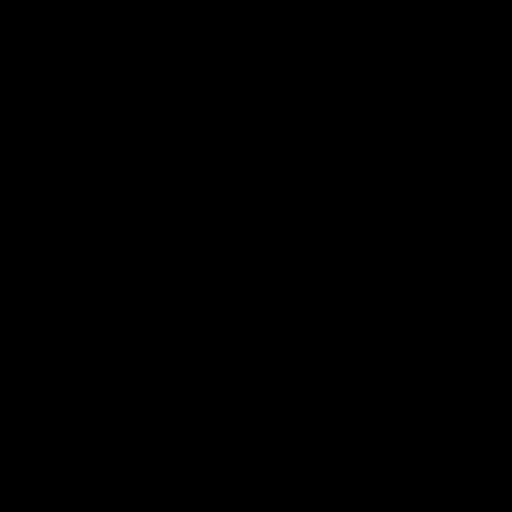

[Series 400: cor cemra ft · coronal · 1.4mm · 0.59mm/px · 7 of 92 slices shown]
[im 1/92]
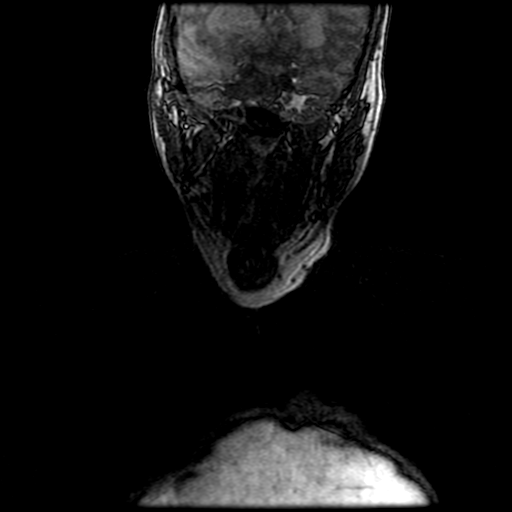
[im 16/92]
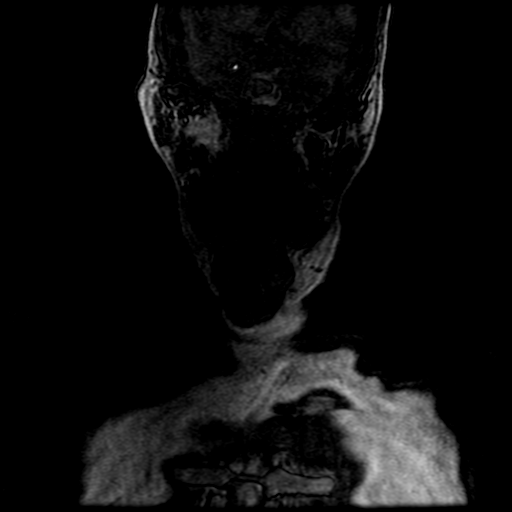
[im 31/92]
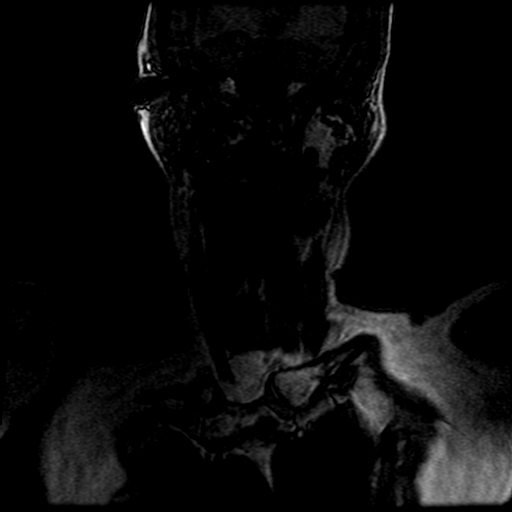
[im 46/92]
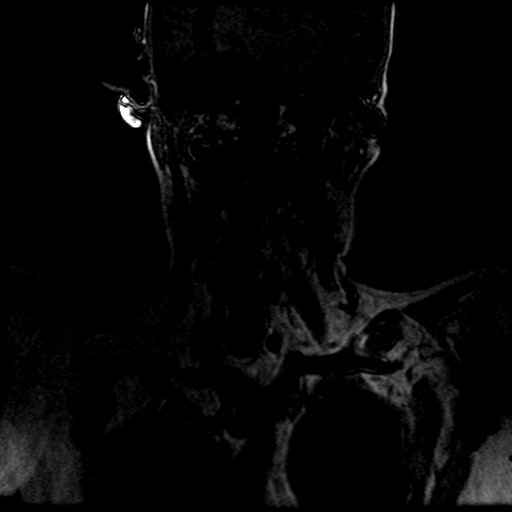
[im 61/92]
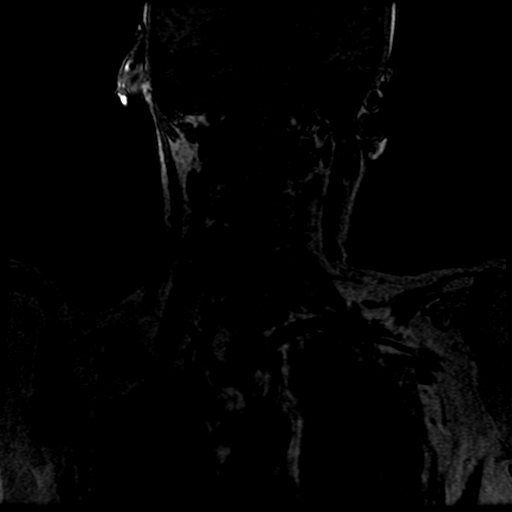
[im 76/92]
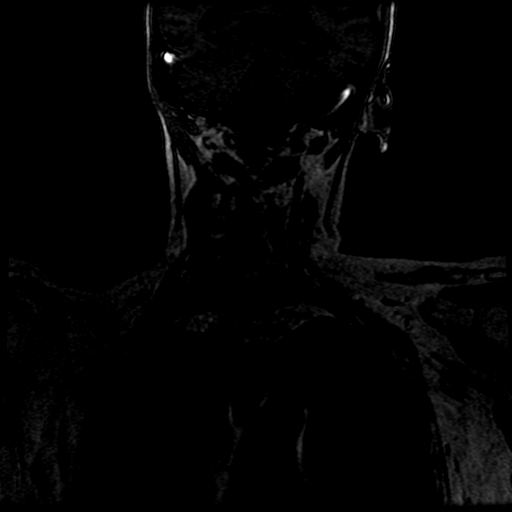
[im 92/92]
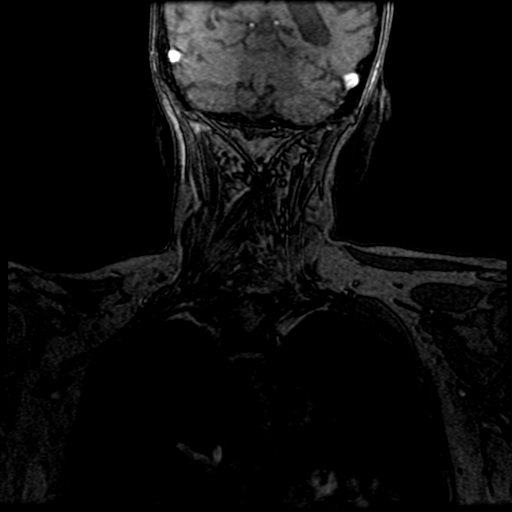

[Series 401: ph1/cor cemra ft · coronal · 1.4mm · 0.59mm/px · 2 of 92 slices shown]
[im 1/92]
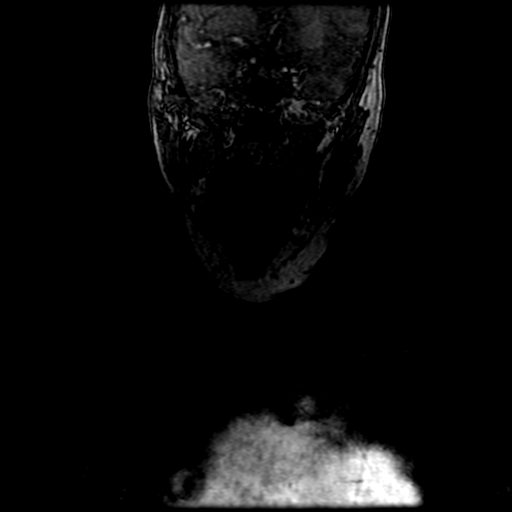
[im 16/92]
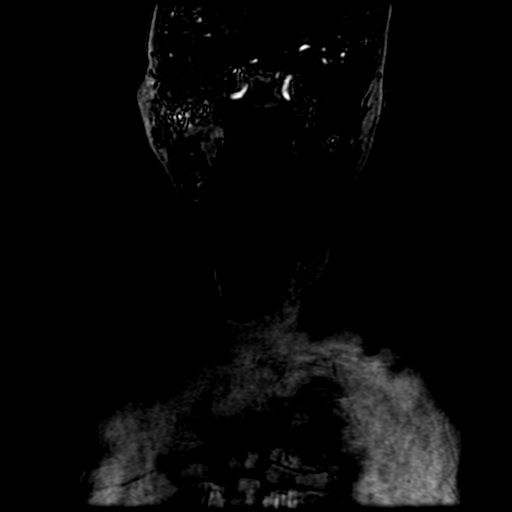

[19 of 48 positions shown; findings below may reference images not displayed]

FINDINGS: Common origin of the innominate and left common carotid arteries.

The common carotid and internal carotid arteries are patent within
the neck without hemodynamically significant stenosis (50% or
greater). Mild atherosclerotic irregularity of the carotid
bifurcations/proximal ICAs, bilaterally.

Vertebral arteries codominant and patent within the neck without
appreciable significant stenosis.
IMPRESSION: The common carotid and internal carotid arteries are patent within
the neck without hemodynamically significant stenosis. Mild
atherosclerotic irregularity of the carotid bifurcations/proximal
ICAs, bilaterally.

Vertebral arteries patent within the neck without significant
stenosis.

## 2021-11-10 MED ORDER — LORAZEPAM 2 MG/ML IJ SOLN: 0.0000 mg | Freq: Three times a day (TID) | INTRAMUSCULAR | Status: DC

## 2021-11-10 MED ORDER — GADOBUTROL 1 MMOL/ML IV SOLN
4.9000 mL | Freq: Once | INTRAVENOUS | Status: AC | PRN
  Administered 2021-11-10: 4.9 mL via INTRAVENOUS

## 2021-11-10 MED ORDER — ATORVASTATIN CALCIUM 40 MG PO TABS
40.0000 mg | ORAL_TABLET | Freq: Every day | ORAL | Status: DC
  Administered 2021-11-10 – 2021-11-11 (×2): 40 mg via ORAL
  Filled 2021-11-10 (×2): qty 1

## 2021-11-10 MED ORDER — ENSURE ENLIVE PO LIQD
237.0000 mL | Freq: Two times a day (BID) | ORAL | Status: DC
  Administered 2021-11-10 – 2021-11-11 (×3): 237 mL via ORAL

## 2021-11-10 MED ORDER — ASPIRIN 81 MG PO CHEW
81.0000 mg | CHEWABLE_TABLET | Freq: Every day | ORAL | Status: DC
  Administered 2021-11-10 – 2021-11-11 (×2): 81 mg via ORAL
  Filled 2021-11-10 (×2): qty 1

## 2021-11-10 MED ORDER — LORAZEPAM 2 MG/ML IJ SOLN: 0.0000 mg | INTRAMUSCULAR | Status: DC

## 2021-11-10 NOTE — Progress Notes (Signed)
PT Cancellation Note  Patient Details Name: Holly Phelps MRN: [id number] DOB: 1961-04-17   Cancelled Treatment:    Reason Eval/Treat Not Completed: Patient at procedure or test/unavailable. PT attempting evaluation; however, transport arriving to take pt for an MRI. PT will continue to follow-up with pt acutely as available.    [place] 11/10/2021, 8:47 AM

## 2021-11-10 NOTE — Evaluation (Signed)
Physical Therapy Evaluation Patient Details Name: Holly Phelps MRN: [id number] DOB: 06-27-1961 Today's Date: 11/10/2021  History of Present Illness  Patient is a 60 y/o female who presents on 11/09/21 with diploplia and inability to walk. Admitted with likely small pontine or brainstem stroke not seen on MRI secondary to small vessel disease in setting of uncontrolled hypertension. Brain MRA-Atherosclerotic irregularity of the M2 and more distal MCA vessels bilaterally. PMH includes HTN TB, ETOH use, cocaine use and protein calorie malnutrition.  Clinical Impression  Patient presents with generalized weakness, impaired vision, anxiety and impaired mobility s/p above. Pt lives at home with her spouse and reports being independent for ambulation and ADLs at baseline. Today, pt highly anxious about mobility and walking due to visual deficits and weakness. Requires Min guard assist-Min A for transfers and gait training with use of RW for support. Veers left in hallway and needs assist to navigate RW. Has support from spouse at home. Would benefit from neuro OPPT to address overall weakness, visual deficits and higher level balance. Will follow acutely to maximize independence and mobility prior to return home.       Recommendations for follow up therapy are one component of a multi-disciplinary discharge planning process, led by the attending physician.  Recommendations may be updated based on patient status, additional functional criteria and insurance authorization.  Follow Up Recommendations Outpatient PT (neuro)    Assistance Recommended at Discharge Frequent or constant Supervision/Assistance  Functional Status Assessment Patient has had a recent decline in their functional status and demonstrates the ability to make significant improvements in function in a reasonable and predictable amount of time.  Equipment Recommendations  Rolling walker (2 wheels)    Recommendations for Other Services        Precautions / Restrictions Precautions Precautions: Fall Restrictions Weight Bearing Restrictions: No      Mobility  Bed Mobility Overal bed mobility: Needs Assistance Bed Mobility: Sit to Supine     Supine to sit: Min assist Sit to supine: Modified independent (Device/Increase time)   General bed mobility comments: Able to bring LEs into bed to return to supine.    Transfers Overall transfer level: Needs assistance Equipment used: Rolling walker (2 wheels) Transfers: Sit to/from Stand Sit to Stand: Min guard           General transfer comment: Min guard for safety, pt attempting to sit on rail when getting back into bed needing cues for placement of bottom.    Ambulation/Gait Ambulation/Gait assistance: Min guard Gait Distance (Feet): 100 Feet Assistive device: Rolling walker (2 wheels);1 person hand held assist Gait Pattern/deviations: Step-through pattern;Narrow base of support;Decreased step length - right;Decreased step length - left Gait velocity: decreased Gait velocity interpretation: <1.8 ft/sec, indicate of risk for recurrent falls   General Gait Details: Slow, short steps with RW too far anterior, very guarded and anxious, veers left.  Stairs            Wheelchair Mobility    Modified Rankin (Stroke Patients Only) Modified Rankin (Stroke Patients Only) Pre-Morbid Rankin Score: Slight disability Modified Rankin: Moderately severe disability     Balance Overall balance assessment: Mild deficits observed, not formally tested                                           Pertinent Vitals/Pain Pain Assessment: Faces Faces Pain Scale: Hurts little more Pain  Location: bil calves with walking Pain Descriptors / Indicators: Aching;Discomfort Pain Intervention(s): Monitored during session;Repositioned;Limited activity within patient's tolerance    Home Living Family/patient expects to be discharged to:: Private  residence Living Arrangements: Spouse/significant other Available Help at Discharge: Family;Available 24 hours/day Type of Home: House Home Access: Stairs to enter   [employer] of Steps: 2 Alternate Level Stairs-Number of Steps: ramp to get to second level Home Layout: Two level Home Equipment: Conservation officer, nature (2 wheels);Cane - single point;Shower seat      Prior Function Prior Level of Function : Independent/Modified Independent;History of Falls (last six months)             Mobility Comments: independent with ambulation, drives, reports fall in last 6 months ADLs Comments: does own ADLs, makes breakfast, does finances     Hand Dominance   Dominant Hand: Right    Extremity/Trunk Assessment   Upper Extremity Assessment Upper Extremity Assessment: Defer to OT evaluation    Lower Extremity Assessment Lower Extremity Assessment: Generalized weakness (grossly functional ~4/5 throughout. Sensation WFLs BLEs)    Cervical / Trunk Assessment Cervical / Trunk Assessment: Normal  Communication   Communication: No difficulties  Cognition Arousal/Alertness: Awake/alert Behavior During Therapy: Anxious Overall Cognitive Status: Within Functional Limits for tasks assessed                                 General Comments: Anxious due to poor vision and weakness        General Comments General comments (skin integrity, edema, etc.): Pt immediately removed taping on glasses post walk. Difficulty seeing IV pole in the left front of her visual field.    Exercises     Assessment/Plan    PT Assessment Patient needs continued PT services  PT Problem List Pain;Decreased balance;Decreased knowledge of use of DME;Decreased strength;Decreased mobility       PT Treatment Interventions Therapeutic activities;Gait training;Functional mobility training;Balance training;Stair training;Therapeutic exercise;Patient/family education;DME instruction    PT Goals  (Current goals can be found in the Care Plan section)  Acute Rehab PT Goals Patient Stated Goal: to get better and go home PT Goal Formulation: With patient Time For Goal Achievement: 11/24/21 Potential to Achieve Goals: Good    Frequency Min 4X/week   Barriers to discharge        Co-evaluation               AM-PAC PT "6 Clicks" Mobility  Outcome Measure Help needed turning from your back to your side while in a flat bed without using bedrails?: None Help needed moving from lying on your back to sitting on the side of a flat bed without using bedrails?: A Little Help needed moving to and from a bed to a chair (including a wheelchair)?: A Little Help needed standing up from a chair using your arms (e.g., wheelchair or bedside chair)?: A Little Help needed to walk in hospital room?: A Little Help needed climbing 3-5 steps with a railing? : A Little 6 Click Score: 19    End of Session Equipment Utilized During Treatment: Gait belt Activity Tolerance: Patient tolerated treatment well;Other (comment) (anxiety) Patient left: in bed;with call bell/phone within reach;with bed alarm set Nurse Communication: Mobility status PT Visit Diagnosis: Difficulty in walking, not elsewhere classified (R26.2);Muscle weakness (generalized) (M62.81);Pain Pain - Right/Left:  (bil) Pain - part of body: Leg    Time: 2409-7353 PT Time Calculation (min) (ACUTE ONLY): 16  min   Charges:   PT Evaluation $PT Eval Moderate Complexity: 1 Mod          Marisa Severin, PT, DPT Acute Rehabilitation Services Pager [phone] Office [phone]     Marguarite Arbour A Sabra Heck 11/10/2021, 4:06 PM

## 2021-11-10 NOTE — Evaluation (Signed)
Occupational Therapy Evaluation Patient Details Name: Holly Phelps MRN: [id number] DOB: 02-19-1961 Today's Date: 11/10/2021   History of Present Illness This 60 y.o. female admitted with reports of diplopia with onset 3 days PTA.  MRI of the brain showed Extensive atrophy, Extensive chronic microvascular ischemia, Multiple areas of chronic microhemorrhage in the brain including the pons, 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  PMH includes: TB, fatty liver, calcification of aorta, HA, HTN, remote h/o cocaine abuse, ETOH abuse   Clinical Impression   Pt admitted for concerns listed above. PTA Pt reported that she was independent with all ADL's and IADL's until Monday when her symptoms began. Since Monday, pt reported that she requires assist with functional mobility and ADL's. At this time, pt presents with increased anxiety, weakness, diplopia/visual deficits, and balance concerns. She is requiring min guard to mod A for all ADL's and the use of a RW. Recommending OP OT at a Neuro Clinic at this time to address vision deficits and overall weakness/balance deficits. OT will follow acutely to address concerns listed below.      Recommendations for follow up therapy are one component of a multi-disciplinary discharge planning process, led by the attending physician.  Recommendations may be updated based on patient status, additional functional criteria and insurance authorization.   Follow Up Recommendations  Outpatient OT    Assistance Recommended at Discharge Intermittent Supervision/Assistance  Functional Status Assessment  Patient has had a recent decline in their functional status and demonstrates the ability to make significant improvements in function in a reasonable and predictable amount of time.  Equipment Recommendations  None recommended by OT    Recommendations for Other Services        Precautions / Restrictions Precautions Precautions: Fall Restrictions Weight Bearing Restrictions: No      Mobility Bed Mobility Overal bed mobility: Needs Assistance Bed Mobility: Supine to Sit     Supine to sit: Min assist Sit to supine: Modified independent (Device/Increase time)   General bed mobility comments: Min A to elevate up to sitting    Transfers Overall transfer level: Needs assistance Equipment used: Rolling walker (2 wheels) Transfers: Sit to/from Stand Sit to Stand: Min guard           General transfer comment: Min G for safety due to diplopia      Balance Overall balance assessment: Mild deficits observed, not formally tested                                         ADL either performed or assessed with clinical judgement   ADL Overall ADL's : Needs assistance/impaired Eating/Feeding: Set up;Sitting   Grooming: Set up;Sitting   Upper Body Bathing: Min guard;Sitting   Lower Body Bathing: Moderate assistance;Sitting/lateral leans;Sit to/from stand   Upper Body Dressing : Min guard;Sitting   Lower Body Dressing: Minimal assistance;Sitting/lateral leans;Sit to/from stand   Toilet Transfer: Minimal assistance;Ambulation   Toileting- Clothing Manipulation and Hygiene: Minimal assistance;Sitting/lateral lean;Sit to/from stand       Functional mobility during ADLs: Minimal assistance;Rolling walker (2 wheels) General ADL Comments: Pt very anxious with mobility, reports due to weakness and diplopia     Vision Baseline Vision/History: 1 Wears glasses Ability to See in Adequate Light: 0 Adequate Patient Visual Report: Diplopia Vision Assessment?: Yes Eye Alignment: Within Functional Limits Ocular Range of Motion: Within  Functional Limits Alignment/Gaze Preference: Within Defined Limits Tracking/Visual Pursuits: Able to track stimulus in all quads without difficulty Saccades: Overshoots Convergence: Impaired  (comment) Visual Fields: Impaired-to be further tested in functional context Diplopia Assessment: Present in far gaze Depth Perception: Overshoots Additional Comments: OT taped pt's glasses and diplopia appears improved functionally, however pt keeps reporting "I dont know, I dont know."     Perception     Praxis      Pertinent Vitals/Pain Pain Assessment: No/denies pain Faces Pain Scale: Hurts little more Pain Location: bil calves Pain Descriptors / Indicators: Aching;Discomfort Pain Intervention(s): Monitored during session;Repositioned;Limited activity within patient's tolerance     Hand Dominance Right   Extremity/Trunk Assessment Upper Extremity Assessment Upper Extremity Assessment: Generalized weakness   Lower Extremity Assessment Lower Extremity Assessment: Defer to PT evaluation   Cervical / Trunk Assessment Cervical / Trunk Assessment: Normal   Communication Communication Communication: No difficulties   Cognition Arousal/Alertness: Awake/alert Behavior During Therapy: Anxious Overall Cognitive Status: Within Functional Limits for tasks assessed                                 General Comments: Anxious due to poor vision and weakness     General Comments  OT taped pt's glasses to attempt to assist with diplopia.    Exercises     Shoulder Instructions      Home Living Family/patient expects to be discharged to:: Private residence Living Arrangements: Spouse/significant other Available Help at Discharge: Family Type of Home: House Home Access: Stairs to enter Technical brewer of Steps: 2   Home Layout: Two level Alternate Level Stairs-Number of Steps: ramp to get to second level   Bathroom Shower/Tub: Occupational psychologist: Standard     Home Equipment: Conservation officer, nature (2 wheels);Cane - single point;Shower seat          Prior Functioning/Environment Prior Level of Function : Independent/Modified  Independent;History of Falls (last six months)             Mobility Comments: independent with ambulation, drives, ADLs Comments: does own ADLs, makes breakfast, does finances        OT Problem List: Decreased strength;Decreased activity tolerance;Impaired balance (sitting and/or standing);Impaired vision/perception;Decreased coordination;Decreased safety awareness;Decreased knowledge of use of DME or AE      OT Treatment/Interventions: Self-care/ADL training;Therapeutic exercise;Energy conservation;DME and/or AE instruction;Therapeutic activities;Visual/perceptual remediation/compensation;Patient/family education;Balance training    OT Goals(Current goals can be found in the care plan section) Acute Rehab OT Goals Patient Stated Goal: To be independent OT Goal Formulation: With patient Time For Goal Achievement: 11/24/21 Potential to Achieve Goals: Good  OT Frequency: Min 2X/week   Barriers to D/C:            Co-evaluation              AM-PAC OT "6 Clicks" Daily Activity     Outcome Measure Help from another person eating meals?: A Little Help from another person taking care of personal grooming?: A Little Help from another person toileting, which includes using toliet, bedpan, or urinal?: A Little Help from another person bathing (including washing, rinsing, drying)?: A Little Help from another person to put on and taking off regular upper body clothing?: A Little Help from another person to put on and taking off regular lower body clothing?: A Little 6 Click Score: 18   End of Session Equipment Utilized During Treatment: Gait belt;Rolling walker (2  wheels) Nurse Communication: Mobility status  Activity Tolerance: Patient tolerated treatment well Patient left: Other (comment) (Up in hallway with PT)  OT Visit Diagnosis: Unsteadiness on feet (R26.81);Other abnormalities of gait and mobility (R26.89);Muscle weakness (generalized) (M62.81);Low vision, both eyes  (H54.2)                Time: 2878-6767 OT Time Calculation (min): 18 min Charges:  OT General Charges $OT Visit: 1 Visit OT Evaluation $OT Eval Moderate Complexity: 1 Mod  Lainee Lehrman H., OTR/L Acute Rehabilitation  Abrial Arrighi Elane Yolanda Bonine 11/10/2021, 3:58 PM

## 2021-11-10 NOTE — TOC CAGE-AID Note (Signed)
Transition of Care [facility]) - CAGE-AID Screening   Patient Details  Name: Holly Phelps MRN: [id number] Date of Birth: September 09, 1961  [facility]) CM/SW Contact:    Pollie Friar, RN Phone Number: 11/10/2021, 11:44 AM   Clinical Narrative: Patient refused inpatient/ outpatient alcohol counseling resources.   CAGE-AID Screening:    Have You Ever Felt You Ought to Cut Down on Your Drinking or Drug Use?: Yes Have People Annoyed You By Critizing Your Drinking Or Drug Use?: No Have You Felt Bad Or Guilty About Your Drinking Or Drug Use?: No Have You Ever Had a Drink or Used Drugs First Thing In The Morning to Steady Your Nerves or to Get Rid of a Hangover?: No CAGE-AID Score: 1  Substance Abuse Education Offered: Yes (patient refused)

## 2021-11-10 NOTE — Progress Notes (Signed)
PROGRESS NOTE  Holly Phelps XHB:[id number] DOB: 1961/11/16 DOA: 11/09/2021 PCP: Maximiano Coss, NP   LOS: 1 day   Brief Narrative / Interim history: 60 year old female with tobacco, EtOH, prior cocaine use comes to the hospital with complaints of weakness, inability to walk without family's assistance as well as double vision.  She has not seen a doctor in several years, not taking any medications.  She denies any prior diagnosis of medical conditions.  Subjective / 24h Interval events: Doing well this morning, persistent symptoms. Complains of mainly bilateral LE weakness and difficulties with walking  Assessment & Plan: Principal Problem Probable CVA-neurology consulted and following.  Per neurology, suspecting a combination of potential oculomotor and MLF stroke in the central pons/midbrain with sparring of the edinger westphal nucleus.  Brainstem strokes could easily be missed on the MRIs due to their small size.  Work-up underway, still remains significantly weak and may need SNF.  2D echo pending.  MRI MRA without any significant LVO.  Lipid panel shows an LDL of 126.  Start statin.  Stroke neuro MD to see.  Continue aspirin  Active Problems Tobacco use-continue nicotine patch, counseled for cessation  EtOH use-she tells me he drinks every other day, 1-2 shots.  Continue CIWA  Hyperlipidemia-start statin  Essential hypertension-likely has underlying HTN, for now allow permissive hypertension regimen normalized 5 to 7 days  Scheduled Meds:  aspirin  81 mg Oral Daily   atorvastatin  40 mg Oral Daily   folic acid  1 mg Oral Daily   LORazepam  0-4 mg Intravenous Q4H   Followed by   Derrill Memo ON 11/11/2021] LORazepam  0-4 mg Intravenous Q8H   multivitamin with minerals  1 tablet Oral Daily   nicotine  21 mg Transdermal Daily   thiamine  100 mg Oral Daily   Or   thiamine  100 mg Intravenous Daily   Continuous Infusions:  sodium chloride 50 mL/hr at 11/09/21 2240   PRN  Meds:.acetaminophen **OR** acetaminophen (TYLENOL) oral liquid 160 mg/5 mL **OR** acetaminophen, LORazepam **OR** LORazepam, senna-docusate  Diet Orders (From admission, onward)     Start     Ordered   11/09/21 2149  Diet Heart Room service appropriate? Yes; Fluid consistency: Thin  Diet effective now       Question Answer Comment  Room service appropriate? Yes   Fluid consistency: Thin      11/09/21 2148            DVT prophylaxis: Place and maintain sequential compression device Start: 11/09/21 1936     Code Status: Full Code  Family Communication: no family at bedside   Status is: Inpatient  Remains inpatient appropriate because: persistent symptoms / weakness, workup pending  Level of care: Telemetry Surgical  Consultants:  Neurology   Procedures:  2D echo: pending  Microbiology  none  Antimicrobials: none    Objective: Vitals:   11/10/21 0048 11/10/21 0256 11/10/21 0456 11/10/21 0819  BP: (!) 163/95 140/75 133/84 (!) 156/101  Pulse: 79 69 71 83  Resp: 19 12 14 18   Temp: 97.8 F (36.6 C)   98 F (36.7 C)  TempSrc: Oral   Oral  SpO2: 100% 100% 98% 100%  Weight:      Height:        Intake/Output Summary (Last 24 hours) at 11/10/2021 1044 Last data filed at 11/10/2021 0300 Gross per 24 hour  Intake 215.88 ml  Output --  Net 215.88 ml   [employer]   11/09/21  1404  Weight: 49.9 kg    Examination:  Constitutional: NAD Eyes: no scleral icterus ENMT: Mucous membranes are moist.  Neck: normal, supple Respiratory: clear to auscultation bilaterally, no wheezing, no crackles. Cardiovascular: Regular rate and rhythm, no murmurs / rubs / gallops. No LE edema.  Abdomen: non distended, no tenderness. Bowel sounds positive.  Musculoskeletal: no clubbing / cyanosis.  Skin: no rashes Neurologic: no focal deficits  Data Reviewed: I have independently reviewed following labs and imaging studies   CBC: Recent Labs  Lab 11/09/21 1421  WBC 8.5   NEUTROABS 6.0  HGB 12.3  HCT 37.7  MCV 107.4*  PLT 034   Basic Metabolic Panel: Recent Labs  Lab 11/09/21 1421  NA 136  K 3.6  CL 98  CO2 20*  GLUCOSE 77  BUN 13  CREATININE 0.73  CALCIUM 9.4   Liver Function Tests: Recent Labs  Lab 11/09/21 1421  AST 78*  ALT 48*  ALKPHOS 107  BILITOT 1.4*  PROT 8.0  ALBUMIN 4.5   Coagulation Profile: No results for input(s): INR, PROTIME in the last 168 hours. HbA1C: No results for input(s): HGBA1C in the last 72 hours. CBG: Recent Labs  Lab 11/09/21 1407 11/09/21 2017 11/10/21 0630  GLUCAP 74 66* 73    Recent Results (from the past 240 hour(s))  Resp Panel by RT-PCR (Flu A&B, Covid) Nasopharyngeal Swab     Status: None   Collection Time: 11/09/21  4:40 PM   Specimen: Nasopharyngeal Swab; Nasopharyngeal(NP) swabs in vial transport medium  Result Value Ref Range Status   SARS Coronavirus 2 by RT PCR NEGATIVE NEGATIVE Final    Comment: (NOTE) SARS-CoV-2 target nucleic acids are NOT DETECTED.  The SARS-CoV-2 RNA is generally detectable in upper respiratory specimens during the acute phase of infection. The lowest concentration of SARS-CoV-2 viral copies this assay can detect is 138 copies/mL. A negative result does not preclude SARS-Cov-2 infection and should not be used as the sole basis for treatment or other patient management decisions. A negative result may occur with  improper specimen collection/handling, submission of specimen other than nasopharyngeal swab, presence of viral mutation(s) within the areas targeted by this assay, and inadequate number of viral copies(<138 copies/mL). A negative result must be combined with clinical observations, patient history, and epidemiological information. The expected result is Negative.  Fact Sheet for Patients:  EntrepreneurPulse.com.au  Fact Sheet for Healthcare Providers:  IncredibleEmployment.be  This test is no t yet approved  or cleared by the [place] FDA and  has been authorized for detection and/or diagnosis of SARS-CoV-2 by FDA under an Emergency Use Authorization (EUA). This EUA will remain  in effect (meaning this test can be used) for the duration of the COVID-19 declaration under Section 564(b)(1) of the Act, 21 U.S.C.section 360bbb-3(b)(1), unless the authorization is terminated  or revoked sooner.       Influenza A by PCR NEGATIVE NEGATIVE Final   Influenza B by PCR NEGATIVE NEGATIVE Final    Comment: (NOTE) The Xpert Xpress SARS-CoV-2/FLU/RSV plus assay is intended as an aid in the diagnosis of influenza from Nasopharyngeal swab specimens and should not be used as a sole basis for treatment. Nasal washings and aspirates are unacceptable for Xpert Xpress SARS-CoV-2/FLU/RSV testing.  Fact Sheet for Patients: EntrepreneurPulse.com.au  Fact Sheet for Healthcare Providers: IncredibleEmployment.be  This test is not yet approved or cleared by the [place] FDA and has been authorized for detection and/or diagnosis of SARS-CoV-2 by FDA under an Emergency Use  Authorization (EUA). This EUA will remain in effect (meaning this test can be used) for the duration of the COVID-19 declaration under Section 564(b)(1) of the Act, 21 U.S.C. section 360bbb-3(b)(1), unless the authorization is terminated or revoked.  Performed at [facility], [place] [address] [place], [place] [address]      Radiology Studies: CT Head Wo Contrast  Result Date: 11/09/2021 CLINICAL DATA:  Dizziness EXAM: CT HEAD WITHOUT CONTRAST TECHNIQUE: Contiguous axial images were obtained from the base of the skull through the vertex without intravenous contrast. COMPARISON:  10/06/2021 FINDINGS: Brain: No evidence of acute infarction, hemorrhage, extra-axial collection, ventriculomegaly, or mass effect. Generalized cerebral atrophy. Periventricular white matter low  attenuation likely secondary to microangiopathy. Vascular: Cerebrovascular atherosclerotic calcifications are noted. Skull: Negative for fracture or focal lesion. Sinuses/Orbits: Visualized portions of the orbits are unremarkable. Visualized portions of the paranasal sinuses are unremarkable. Visualized portions of the mastoid air cells are unremarkable. Other: None. IMPRESSION: 1. No acute intracranial pathology. 2. Chronic microvascular disease and cerebral atrophy. Electronically Signed   By: Kathreen Devoid M.D.   On: 11/09/2021 15:00   MR ANGIO HEAD WO CONTRAST  Result Date: 11/10/2021 CLINICAL DATA:  Provided history: Neuro deficit, acute, stroke suspected. EXAM: MRA HEAD WITHOUT CONTRAST TECHNIQUE: Angiographic images of the Circle of Willis were acquired using MRA technique without intravenous contrast. COMPARISON:  Brain MRI 11/09/2021.  MRI of the orbits 11/09/2021. FINDINGS: Mildly motion degraded exam. Anterior circulation: The intracranial internal carotid arteries are patent. The M1 middle cerebral arteries are patent. Atherosclerotic irregularity of the M2 and more distal middle cerebral artery vessels, bilaterally. No M2 proximal branch occlusion or high-grade proximal stenosis is identified. The anterior cerebral arteries are patent. No intracranial aneurysm is identified. Posterior circulation: The intracranial vertebral arteries are patent. The basilar artery is patent. The posterior cerebral arteries are patent. The right PCA is fetal in origin. The left posterior communicating artery is diminutive or absent. Anatomic variants: As described. IMPRESSION: No intracranial large vessel occlusion or proximal high-grade arterial stenosis. Atherosclerotic irregularity of the M2 and more distal MCA vessels, bilaterally. Electronically Signed   By: Kellie Simmering D.O.   On: 11/10/2021 10:26   MR ANGIO NECK W WO CONTRAST  Result Date: 11/10/2021 CLINICAL DATA:  Neuro deficit, acute, stroke suspected.  EXAM: MRA NECK WITHOUT AND WITH CONTRAST TECHNIQUE: Multiplanar and multiecho pulse sequences of the neck were obtained without and with intravenous contrast. Angiographic images of the neck were obtained using MRA technique without and with intravenous contrast. CONTRAST:  4.23mL GADAVIST GADOBUTROL 1 MMOL/ML IV SOLN COMPARISON:  Same day MRA head 11/10/2021. MRI of the orbits 11/09/2021. MRI of the head 11/09/2021. FINDINGS: Common origin of the innominate and left common carotid arteries. The common carotid and internal carotid arteries are patent within the neck without hemodynamically significant stenosis (50% or greater). Mild atherosclerotic irregularity of the carotid bifurcations/proximal ICAs, bilaterally. Vertebral arteries codominant and patent within the neck without appreciable significant stenosis. IMPRESSION: The common carotid and internal carotid arteries are patent within the neck without hemodynamically significant stenosis. Mild atherosclerotic irregularity of the carotid bifurcations/proximal ICAs, bilaterally. Vertebral arteries patent within the neck without significant stenosis. Electronically Signed   By: Kellie Simmering D.O.   On: 11/10/2021 10:36   MR Brain W and Wo Contrast  Result Date: 11/09/2021 CLINICAL DATA:  Diplopia.  Head injury 1 month ago. EXAM: MRI HEAD AND ORBITS WITHOUT AND WITH CONTRAST TECHNIQUE: Multiplanar, multiecho pulse sequences of the brain and  surrounding structures were obtained without and with intravenous contrast. Multiplanar, multiecho pulse sequences of the orbits and surrounding structures were obtained including fat saturation techniques, before and after intravenous contrast administration. CONTRAST:  19mL GADAVIST GADOBUTROL 1 MMOL/ML IV SOLN COMPARISON:  CT head 11/09/2021 FINDINGS: MRI HEAD FINDINGS Brain: Advanced cerebral atrophy. Ventricular enlargement consistent with the level of atrophy. Negative for acute infarct. Extensive chronic microvascular  ischemic change in the white matter and pons. Multiple areas of chronic microhemorrhage including in the pons bilaterally and in both cerebral hemispheres. Enhancing vein in the left parietal lobe compatible with developmental venous anomaly 2 mm enhancement in the central pons. 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 Vascular: Normal arterial flow voids. Skull and upper cervical spine: No focal skeletal lesion. ACDF cervical spine. Other: None MRI ORBITS FINDINGS Orbits: No orbital mass or edema. Optic nerve normal bilaterally. Extraocular muscles normal bilaterally. No enhancing lesion in the orbit. Optic chiasm normal. Pituitary not enlarged. Visualized sinuses: Clear Soft tissues: No soft tissue swelling or mass IMPRESSION: 1. No acute intracranial abnormality. 2. Extensive atrophy. Extensive chronic microvascular ischemia. Multiple areas of chronic microhemorrhage in the brain including the pons 3. 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   On: 11/09/2021 18:50   MR ORBITS W WO CONTRAST  Result Date: 11/09/2021 CLINICAL DATA:  Diplopia.  Head injury 1 month ago. EXAM: MRI HEAD AND ORBITS WITHOUT AND WITH CONTRAST TECHNIQUE: Multiplanar, multiecho pulse sequences of the brain and surrounding structures were obtained without and with intravenous contrast. Multiplanar, multiecho pulse sequences of the orbits and surrounding structures were obtained including fat saturation techniques, before and after intravenous contrast administration. CONTRAST:  95mL GADAVIST GADOBUTROL 1 MMOL/ML IV SOLN COMPARISON:  CT head 11/09/2021 FINDINGS: MRI HEAD FINDINGS Brain: Advanced cerebral atrophy. Ventricular enlargement consistent with the level of atrophy. Negative for acute infarct. Extensive chronic microvascular ischemic change in the white matter and  pons. Multiple areas of chronic microhemorrhage including in the pons bilaterally and in both cerebral hemispheres. Enhancing vein in the left parietal lobe compatible with developmental venous anomaly 2 mm enhancement in the central pons. 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 Vascular: Normal arterial flow voids. Skull and upper cervical spine: No focal skeletal lesion. ACDF cervical spine. Other: None MRI ORBITS FINDINGS Orbits: No orbital mass or edema. Optic nerve normal bilaterally. Extraocular muscles normal bilaterally. No enhancing lesion in the orbit. Optic chiasm normal. Pituitary not enlarged. Visualized sinuses: Clear Soft tissues: No soft tissue swelling or mass IMPRESSION: 1. No acute intracranial abnormality. 2. Extensive atrophy. Extensive chronic microvascular ischemia. Multiple areas of chronic microhemorrhage in the brain including the pons 3. 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   On: 11/09/2021 18:50    Marzetta Board, MD, PhD [facility]  Between 7 am - 7 pm I am available, please contact me via Amion (for emergencies) or Securechat (non urgent messages)  Between 7 pm - 7 am I am not available, please contact night coverage MD/APP via Amion

## 2021-11-10 NOTE — Evaluation (Signed)
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) 05/27/2015   Anemia 01/29/2013   Nausea vomiting and diarrhea 01/26/2013   Esophagitis 01/26/2013   Anxiety 03/01/2008   Alcohol abuse 03/01/2008   Elevated LFTs 03/01/2008   Past Medical History:  Past Medical History:  Diagnosis Date   Allergy    Anemia    Calcification of aorta ([facility]CC)    Fatty liver    GERD (gastroesophageal reflux disease)    Headache    Hypertension    Hypothyroidism    Tuberculosis    as baby   Past Surgical History:  Past Surgical History:  Procedure Laterality Date   BACK SURGERY     x 2   COLONOSCOPY N/A 03/13/2013   Procedure: COLONOSCOPY;  Surgeon: Leighton Ruff, MD;  Location: [facility];  Service: Endoscopy;  Laterality: N/A;   LAPAROSCOPIC APPENDECTOMY     HPI:  60 y.o. female with history of GERD, HTN, HLD, hypothyroidism, TB, ETOH use, cocaine use and protein calorie malnutrition presenting with diplopia and inability to walk.  Neurology w/u:  likely small pontine or brainstem stroke not seen on MRI secondary to small vessel disease in setting of uncontrolled hypertension.   Assessment / Plan /  Recommendation Clinical Impression  Pt participated in speech/language/cognition evaluation with her husband present at the beginning of the evaluation. Both parties agreed that the pt has some baseline difficulty with memory, but agreed that she is currently at baseline. Her speech and language skills are currently within normal limits. Cognitive-linguistic assessment revealed some difficulty with memory and higher-level executive functioning. Pt expressed that she was very fatigued and was about to sleep when SLP arrived. Pt stated that she believes that her cognition is at baseline and no overt cognitive deficits were noted during evaluation by PT or OT. Acute SLP services will be discontinued at this time, but pt has verbalized agreement with following up with her PCP if she notices increased cognitive-linguistic difficulty after discharge.    SLP Assessment  SLP Recommendation/Assessment: Patient does not need any further Speech Heil Pathology Services SLP Visit Diagnosis: Cognitive communication deficit (R41.841)    Recommendations for follow up therapy are one component of a multi-disciplinary discharge planning process, led by the attending physician.  Recommendations may be updated based on patient status, additional functional criteria and insurance authorization.    Follow Up Recommendations  No SLP follow up    Assistance Recommended at Discharge  None  Functional Status Assessment Patient has not had a recent decline in their functional status  Frequency and Duration           SLP Evaluation Cognition  Overall Cognitive Status: History of cognitive impairments - at baseline Arousal/Alertness: Awake/alert Orientation Level: Oriented X4 Year: 2022 Month: December Day of Week: Correct  Attention: Focused;Sustained Focused Attention: Appears intact Sustained Attention: Appears intact Memory: Impaired Memory Impairment: Decreased recall of new information;Decreased short  term memory Executive Function: Organizing Organizing: Impaired Organizing Impairment: Functional complex       Comprehension  Auditory Comprehension Overall Auditory Comprehension: Appears within functional limits for tasks assessed Yes/No Questions: Within Functional Limits Basic Immediate Environment Questions:  (5/5) Complex Questions:  (5/5) Paragraph Comprehension (via yes/no questions):  (2/4) Two Step Basic Commands:  (4/4) Multistep Basic Commands:  (3/3)    Expression Expression Primary Mode of Expression: Verbal Verbal Expression Overall Verbal Expression: Appears within functional limits for tasks assessed Initiation: No impairment Automatic Speech: Counting;Day of week;Month of year (WNL) Level of Generative/Spontaneous Verbalization: Conversation Repetition: No impairment (5/5) Naming: No impairment Pragmatics: No impairment Written Expression Dominant Hand: Right   Oral / Motor  Motor Speech Overall Motor Speech: Appears within functional limits for tasks assessed Respiration: Within functional limits Phonation: Normal Resonance: Within functional limits Intelligibility: Intelligible Motor Planning: Witnin functional limits Motor Speech Errors: Not applicable           Leighanne Adolph I. Hardin Negus, [place], [place] Office number [phone] Pager [facility] 11/10/2021, 5:12 PM

## 2021-11-10 NOTE — Progress Notes (Signed)
OT Cancellation Note  Patient Details Name: Holly Phelps MRN: [id number] DOB: 28-Jan-1961   Cancelled Treatment:    Reason Eval/Treat Not Completed: Active bedrest order.  Will reattempt once activity orders advanced.  Holly Phelps., OTR/L Acute Rehabilitation Services Pager [phone] Office [phone]   Holly Phelps 11/10/2021, 7:14 AM

## 2021-11-10 NOTE — Progress Notes (Signed)
Echocardiogram 2D Echocardiogram has been performed.  Oneal Deputy Timoty Bourke RDCS 11/10/2021, 10:25 AM

## 2021-11-10 NOTE — Progress Notes (Signed)
PT Cancellation Note  Patient Details Name: Holly Phelps MRN: [id number] DOB: September 09, 1961   Cancelled Treatment:    Reason Eval/Treat Not Completed: Active bedrest order. PT will continue to f/u and await updated activity orders prior to initiating evaluation.    Clearnce Sorrel Raegyn Renda 11/10/2021, 8:06 AM

## 2021-11-10 NOTE — Plan of Care (Signed)
Pt is alert oriented x 4. Pt c/o double vision. Left eye deviates to left side, however pt does follow with both of her eyes. Pt was not able to void throug night. On call [facility] provider paged to inform. Received order for I&O cath.  I&O cath unsuccessful. Pt voided on her own putting out 369ml plus she had large amount of urine in bed.   Problem: Education: Goal: Knowledge of General Education information will improve Description: Including pain rating scale, medication(s)/side effects and non-pharmacologic comfort measures 11/10/2021 0945 by Allayne Stack, RN Outcome: Progressing 11/10/2021 0308 by Allayne Stack, RN Outcome: Progressing   Problem: Health Behavior/Discharge Planning: Goal: Ability to manage health-related needs will improve 11/10/2021 0945 by Allayne Stack, RN Outcome: Progressing 11/10/2021 0308 by Allayne Stack, RN Outcome: Progressing   Problem: Clinical Measurements: Goal: Ability to maintain clinical measurements within normal limits will improve 11/10/2021 0945 by Allayne Stack, RN Outcome: Progressing 11/10/2021 0308 by Allayne Stack, RN Outcome: Progressing Goal: Will remain free from infection 11/10/2021 0945 by Allayne Stack, RN Outcome: Progressing 11/10/2021 0308 by Allayne Stack, RN Outcome: Progressing Goal: Diagnostic test results will improve 11/10/2021 0945 by Allayne Stack, RN Outcome: Progressing 11/10/2021 0308 by Allayne Stack, RN Outcome: Progressing Goal: Respiratory complications will improve 11/10/2021 0945 by Allayne Stack, RN Outcome: Progressing 11/10/2021 0308 by Allayne Stack, RN Outcome: Progressing Goal: Cardiovascular complication will be avoided 11/10/2021 0945 by Allayne Stack, RN Outcome: Progressing 11/10/2021 0308 by Allayne Stack, RN Outcome: Progressing   Problem: Activity: Goal: Risk for activity intolerance will decrease 11/10/2021 0945 by  Allayne Stack, RN Outcome: Progressing 11/10/2021 0308 by Allayne Stack, RN Outcome: Progressing   Problem: Nutrition: Goal: Adequate nutrition will be maintained 11/10/2021 0945 by Allayne Stack, RN Outcome: Progressing 11/10/2021 0308 by Allayne Stack, RN Outcome: Progressing   Problem: Coping: Goal: Level of anxiety will decrease 11/10/2021 0945 by Allayne Stack, RN Outcome: Progressing 11/10/2021 0308 by Allayne Stack, RN Outcome: Progressing   Problem: Elimination: Goal: Will not experience complications related to bowel motility 11/10/2021 0945 by Allayne Stack, RN Outcome: Progressing 11/10/2021 0308 by Allayne Stack, RN Outcome: Progressing Goal: Will not experience complications related to urinary retention 11/10/2021 0945 by Allayne Stack, RN Outcome: Progressing 11/10/2021 0308 by Allayne Stack, RN Outcome: Progressing   Problem: Pain Managment: Goal: General experience of comfort will improve 11/10/2021 0945 by Allayne Stack, RN Outcome: Progressing 11/10/2021 0308 by Allayne Stack, RN Outcome: Progressing   Problem: Safety: Goal: Ability to remain free from injury will improve 11/10/2021 0945 by Allayne Stack, RN Outcome: Progressing 11/10/2021 0308 by Allayne Stack, RN Outcome: Progressing   Problem: Skin Integrity: Goal: Risk for impaired skin integrity will decrease 11/10/2021 0945 by Allayne Stack, RN Outcome: Progressing 11/10/2021 0308 by Allayne Stack, RN Outcome: Progressing

## 2021-11-10 NOTE — TOC Initial Note (Signed)
Transition of Care [facility]) - Initial/Assessment Note    Patient Details  Name: Holly Phelps MRN: [id number] Date of Birth: 07-Apr-1961  Transition [facility] CM/SW Contact:    Pollie Friar, RN Phone Number: 11/10/2021, 11:45 AM  Clinical Narrative:                 Patient is from home with spouse, who is with her most of the time. She denies issues with home medications or transportation.  Awaiting PT/OT recommendations. TOC following.  Expected Discharge Plan: Home/Self Care Barriers to Discharge: Continued Medical Work up   Patient Goals and CMS Choice        Expected Discharge Plan and Services Expected Discharge Plan: Home/Self Care       Living arrangements for the past 2 months: Single Family Home                                      Prior Living Arrangements/Services Living arrangements for the past 2 months: Single Family Home Lives with:: Spouse Patient language and need for interpreter reviewed:: Yes Do you feel safe going back to the place where you live?: Yes        Care giver support system in place?: Yes (comment) Current home services: DME (cane/ walker/ shower seat) Criminal Activity/Legal Involvement Pertinent to Current Situation/Hospitalization: No - Comment as needed  Activities of Daily Living Home Assistive Devices/Equipment: Eyeglasses, Cane (specify quad or straight) ADL Screening (condition at time of admission) Patient's cognitive ability adequate to safely complete daily activities?: No Is the patient deaf or have difficulty hearing?: No Does the patient have difficulty seeing, even when wearing glasses/contacts?: No Does the patient have difficulty concentrating, remembering, or making decisions?: No Patient able to express need for assistance with ADLs?: Yes Does the patient have difficulty dressing or bathing?: No Independently performs ADLs?: No Communication: Needs assistance Dressing (OT): Needs  assistance Grooming: Needs assistance Feeding: Independent, Needs assistance Bathing: Needs assistance Toileting: Needs assistance Is this a change from baseline?: Change from baseline, expected to last <3 days In/Out Bed: Needs assistance Is this a change from baseline?: Change from baseline, expected to last <3 days Walks in Home: Needs assistance Is this a change from baseline?: Change from baseline, expected to last <3 days Does the patient have difficulty walking or climbing stairs?: Yes Weakness of Legs: Both Weakness of Arms/Hands: Both  Permission Sought/Granted                  Emotional Assessment Appearance:: Appears stated age Attitude/Demeanor/Rapport: Engaged Affect (typically observed): Accepting Orientation: : Oriented to Self, Oriented to Place, Oriented to  Time, Oriented to Situation   Psych Involvement: No (comment)  Admission diagnosis:  Diplopia [H53.2] Stroke ([place]) [I63.9] INO (internuclear ophthalmoplegia), 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] Patient Active Problem List   Diagnosis Date Noted   Stroke ([place]) 11/09/2021   Essential hypertension 11/09/2021   Noncompliance with medication regimen 11/09/2021   Cerebral brain hemorrhage ([place]) 11/09/2021   Gouty arthritis 10/26/2020   Need for hepatitis B vaccination 01/12/2016   Calcification of aorta (HCC)    Recurrent Clostridium difficile diarrhea 11/18/2015   Macrocytic anemia 11/18/2015   Nausea and vomiting 11/18/2015   Leucocytosis 11/17/2015   Protein-calorie malnutrition, severe 11/17/2015   Hypomagnesemia 09/08/2015   Hypokalemia 09/07/2015   Tobacco use disorder 09/07/2015   Cocaine abuse ([place]) 05/27/2015   Anemia 01/29/2013  Nausea vomiting and diarrhea 01/26/2013   Esophagitis 01/26/2013   Anxiety 03/01/2008   Alcohol abuse 03/01/2008   Elevated LFTs 03/01/2008   PCP:  Maximiano Coss, NP Pharmacy:   [facility] [place], [place] - [address] AT [address]  [place] [address] Phone: [phone] Fax: [phone]  [facility] [id number] - [place], [place] - [address] [place] [address] Phone: [phone] Fax: [phone]     Social Determinants of Health (SDOH) Interventions    Readmission Risk Interventions No flowsheet data found.

## 2021-11-10 NOTE — Plan of Care (Signed)

## 2021-11-10 NOTE — Progress Notes (Addendum)
STROKE TEAM PROGRESS NOTE   INTERVAL HISTORY Patient is seen in her room with no family at the bedside.  Yesterday, she experienced diplopia and inability to walk unassisted with weakness in both legs.  She continues to endorse diplopia.  She has been hemodynamically stable, and her neurological exam is stable.    Vitals:   11/10/21 0256 11/10/21 0456 11/10/21 0819 11/10/21 1149  BP: 140/75 133/84 (!) 156/101 (!) 150/88  Pulse: 69 71 83 84  Resp: 12 14 18 17   Temp:   98 F (36.7 C) 98.4 F (36.9 C)  TempSrc:   Oral Oral  SpO2: 100% 98% 100% 100%  Weight:      Height:       CBC:  Recent Labs  Lab 11/09/21 1421  WBC 8.5  NEUTROABS 6.0  HGB 12.3  HCT 37.7  MCV 107.4*  PLT 132   Basic Metabolic Panel:  Recent Labs  Lab 11/09/21 1421  NA 136  K 3.6  CL 98  CO2 20*  GLUCOSE 77  BUN 13  CREATININE 0.73  CALCIUM 9.4   Lipid Panel:  Recent Labs  Lab 11/10/21 0831  CHOL 276*  TRIG 118  HDL 126  CHOLHDL 2.2  VLDL 24  LDLCALC 126*   HgbA1c: No results for input(s): HGBA1C in the last 168 hours. Urine Drug Screen: No results for input(s): LABOPIA, COCAINSCRNUR, LABBENZ, AMPHETMU, THCU, LABBARB in the last 168 hours.  Alcohol Level No results for input(s): ETH in the last 168 hours.  IMAGING past 24 hours CT Head Wo Contrast  Result Date: 11/09/2021 CLINICAL DATA:  Dizziness EXAM: CT HEAD WITHOUT CONTRAST TECHNIQUE: Contiguous axial images were obtained from the base of the skull through the vertex without intravenous contrast. COMPARISON:  10/06/2021 FINDINGS: Brain: No evidence of acute infarction, hemorrhage, extra-axial collection, ventriculomegaly, or mass effect. Generalized cerebral atrophy. Periventricular white matter low attenuation likely secondary to microangiopathy. Vascular: Cerebrovascular atherosclerotic calcifications are noted. Skull: Negative for fracture or focal lesion. Sinuses/Orbits: Visualized portions of the orbits are unremarkable. Visualized  portions of the paranasal sinuses are unremarkable. Visualized portions of the mastoid air cells are unremarkable. Other: None. IMPRESSION: 1. No acute intracranial pathology. 2. Chronic microvascular disease and cerebral atrophy. Electronically Signed   By: Kathreen Devoid M.D.   On: 11/09/2021 15:00   MR ANGIO HEAD WO CONTRAST  Result Date: 11/10/2021 CLINICAL DATA:  Provided history: Neuro deficit, acute, stroke suspected. EXAM: MRA HEAD WITHOUT CONTRAST TECHNIQUE: Angiographic images of the Circle of Willis were acquired using MRA technique without intravenous contrast. COMPARISON:  Brain MRI 11/09/2021.  MRI of the orbits 11/09/2021. FINDINGS: Mildly motion degraded exam. Anterior circulation: The intracranial internal carotid arteries are patent. The M1 middle cerebral arteries are patent. Atherosclerotic irregularity of the M2 and more distal middle cerebral artery vessels, bilaterally. No M2 proximal branch occlusion or high-grade proximal stenosis is identified. The anterior cerebral arteries are patent. No intracranial aneurysm is identified. Posterior circulation: The intracranial vertebral arteries are patent. The basilar artery is patent. The posterior cerebral arteries are patent. The right PCA is fetal in origin. The left posterior communicating artery is diminutive or absent. Anatomic variants: As described. IMPRESSION: No intracranial large vessel occlusion or proximal high-grade arterial stenosis. Atherosclerotic irregularity of the M2 and more distal MCA vessels, bilaterally. Electronically Signed   By: Kellie Simmering D.O.   On: 11/10/2021 10:26   MR ANGIO NECK W WO CONTRAST  Result Date: 11/10/2021 CLINICAL DATA:  Neuro deficit, acute, stroke  suspected. EXAM: MRA NECK WITHOUT AND WITH CONTRAST TECHNIQUE: Multiplanar and multiecho pulse sequences of the neck were obtained without and with intravenous contrast. Angiographic images of the neck were obtained using MRA technique without and with  intravenous contrast. CONTRAST:  4.74mL GADAVIST GADOBUTROL 1 MMOL/ML IV SOLN COMPARISON:  Same day MRA head 11/10/2021. MRI of the orbits 11/09/2021. MRI of the head 11/09/2021. FINDINGS: Common origin of the innominate and left common carotid arteries. The common carotid and internal carotid arteries are patent within the neck without hemodynamically significant stenosis (50% or greater). Mild atherosclerotic irregularity of the carotid bifurcations/proximal ICAs, bilaterally. Vertebral arteries codominant and patent within the neck without appreciable significant stenosis. IMPRESSION: The common carotid and internal carotid arteries are patent within the neck without hemodynamically significant stenosis. Mild atherosclerotic irregularity of the carotid bifurcations/proximal ICAs, bilaterally. Vertebral arteries patent within the neck without significant stenosis. Electronically Signed   By: Kellie Simmering D.O.   On: 11/10/2021 10:36   MR Brain W and Wo Contrast  Result Date: 11/09/2021 CLINICAL DATA:  Diplopia.  Head injury 1 month ago. EXAM: MRI HEAD AND ORBITS WITHOUT AND WITH CONTRAST TECHNIQUE: Multiplanar, multiecho pulse sequences of the brain and surrounding structures were obtained without and with intravenous contrast. Multiplanar, multiecho pulse sequences of the orbits and surrounding structures were obtained including fat saturation techniques, before and after intravenous contrast administration. CONTRAST:  22mL GADAVIST GADOBUTROL 1 MMOL/ML IV SOLN COMPARISON:  CT head 11/09/2021 FINDINGS: MRI HEAD FINDINGS Brain: Advanced cerebral atrophy. Ventricular enlargement consistent with the level of atrophy. Negative for acute infarct. Extensive chronic microvascular ischemic change in the white matter and pons. Multiple areas of chronic microhemorrhage including in the pons bilaterally and in both cerebral hemispheres. Enhancing vein in the left parietal lobe compatible with developmental venous anomaly  2 mm enhancement in the central pons. 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 Vascular: Normal arterial flow voids. Skull and upper cervical spine: No focal skeletal lesion. ACDF cervical spine. Other: None MRI ORBITS FINDINGS Orbits: No orbital mass or edema. Optic nerve normal bilaterally. Extraocular muscles normal bilaterally. No enhancing lesion in the orbit. Optic chiasm normal. Pituitary not enlarged. Visualized sinuses: Clear Soft tissues: No soft tissue swelling or mass IMPRESSION: 1. No acute intracranial abnormality. 2. Extensive atrophy. Extensive chronic microvascular ischemia. Multiple areas of chronic microhemorrhage in the brain including the pons 3. 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   On: 11/09/2021 18:50   MR ORBITS W WO CONTRAST  Result Date: 11/09/2021 CLINICAL DATA:  Diplopia.  Head injury 1 month ago. EXAM: MRI HEAD AND ORBITS WITHOUT AND WITH CONTRAST TECHNIQUE: Multiplanar, multiecho pulse sequences of the brain and surrounding structures were obtained without and with intravenous contrast. Multiplanar, multiecho pulse sequences of the orbits and surrounding structures were obtained including fat saturation techniques, before and after intravenous contrast administration. CONTRAST:  33mL GADAVIST GADOBUTROL 1 MMOL/ML IV SOLN COMPARISON:  CT head 11/09/2021 FINDINGS: MRI HEAD FINDINGS Brain: Advanced cerebral atrophy. Ventricular enlargement consistent with the level of atrophy. Negative for acute infarct. Extensive chronic microvascular ischemic change in the white matter and pons. Multiple areas of chronic microhemorrhage including in the pons bilaterally and in both cerebral hemispheres. Enhancing vein in the left parietal lobe compatible with developmental venous anomaly 2 mm enhancement in the central pons.  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 Vascular: Normal  arterial flow voids. Skull and upper cervical spine: No focal skeletal lesion. ACDF cervical spine. Other: None MRI ORBITS FINDINGS Orbits: No orbital mass or edema. Optic nerve normal bilaterally. Extraocular muscles normal bilaterally. No enhancing lesion in the orbit. Optic chiasm normal. Pituitary not enlarged. Visualized sinuses: Clear Soft tissues: No soft tissue swelling or mass IMPRESSION: 1. No acute intracranial abnormality. 2. Extensive atrophy. Extensive chronic microvascular ischemia. Multiple areas of chronic microhemorrhage in the brain including the pons 3. 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   On: 11/09/2021 18:50    PHYSICAL EXAM General:  Thin-appearing female in no acute distress   NEURO:  Mental Status: AA&Ox3  Speech/Language: speech is without dysarthria or aphasia.  Naming, repetition, fluency, and comprehension intact.  Cranial Nerves:  II: PERRL. Visual fields full. III, IV, VI: Dysconjugate gaze with right eye not following left.  Patient continues to endorse horizontal diplopia. Left INO. V: Sensation is intact to light touch and symmetrical to face.  VII: Smile is symmetrical.  VIII: hearing intact to voice. IX, X: Phonation is normal.  XII: tongue is midline without fasciculations. Motor: 5/5 strength to all muscle groups tested.  Tone: is normal and bulk is normal Sensation- Intact to light touch bilaterally. Extinction absent to light touch to DSS.  Coordination: No drift.  Gait- deferred    ASSESSMENT/PLAN Holly Phelps is a 60 y.o. female with history of GERD, HTN, HLD, hypothyroidism, TB, ETOH use, cocaine use and protein calorie malnutrition presenting with diplopia and inability to walk.   Stroke:  likely small  pontine or brainstem stroke not seen on MRI secondary to small vessel disease in setting of uncontrolled hypertension  CT head No acute abnormality. Small vessel disease. Atrophy.  MRI  No acute intracranial abnormality, extensive atrophy MRA  Atherosclerotic irregularity of bilateral M2 and distal MCA vessels Carotid Doppler  pending 2D Echo EF 44-01%, grade 1 diastolic dysfunction, no atrial level shunt MRI orbits negative for abnormalities LDL 126 HgbA1c No results found for requested labs within last 26280 hours. VTE prophylaxis - SCDs    Diet   Diet Heart Room service appropriate? Yes; Fluid consistency: Thin   No antithrombotic prior to admission, now on aspirin 81 mg daily.  Therapy recommendations:  pending Disposition:  pending  Hypertension Home meds:  none Stable Permissive hypertension (OK if < 220/120) but gradually normalize in 5-7 days Long-term BP goal normotensive  Hyperlipidemia Home meds:  none LDL 126, goal < 70 Add atorvastatin 40 mg daily  Continue statin at discharge  ETOH use Patient reports last drink was a few days ago CIWA protocol  Other Stroke Risk Factors Cigarette smoker, advised to stop smoking ETOH use, alcohol level No results found for requested labs within last 26280 hours., advised to drink no more than 1 drink(s) a day Substance abuse - UDS:  THC No results found for requested labs within last 26280 hours., Cocaine No results found for requested labs within last 26280 hours.. Patient advised to stop using due to stroke risk.  Other Active Problems none  Hospital day # [place] , MSN, AGACNP-BC [facility] See Amion for schedule and pager information 11/10/2021 2:54 PM  ATTENDING ATTESTATION:  Pt with left INO on exam. MRI shows atrophy and left pontine CVA. MRA neg.  Dr. Reeves Forth evaluated pt independently, reviewed imaging, chart,  labs. Discussed and formulated plan with the APP. Please see APP note  above for details.   Total 30 minutes spent on counseling patient and coordinating care, writing notes and reviewing chart.  Maura Braaten,MD     To contact Stroke Continuity provider, please refer to [email or web address] After hours, contact General Neurology

## 2021-11-11 LAB — COMPREHENSIVE METABOLIC PANEL WITH GFR
ALT: 33 U/L (ref 0–44)
AST: 54 U/L — ABNORMAL HIGH (ref 15–41)
Albumin: 3.6 g/dL (ref 3.5–5.0)
Alkaline Phosphatase: 102 U/L (ref 38–126)
Anion gap: 15 (ref 5–15)
BUN: 8 mg/dL (ref 6–20)
CO2: 21 mmol/L — ABNORMAL LOW (ref 22–32)
Calcium: 9.5 mg/dL (ref 8.9–10.3)
Chloride: 99 mmol/L (ref 98–111)
Creatinine, Ser: 0.74 mg/dL (ref 0.44–1.00)
GFR, Estimated: >60 mL/min
Glucose, Bld: 87 mg/dL (ref 70–99)
Potassium: 3.4 mmol/L — ABNORMAL LOW (ref 3.5–5.1)
Sodium: 135 mmol/L (ref 135–145)
Total Bilirubin: 0.8 mg/dL (ref 0.3–1.2)
Total Protein: 6.6 g/dL (ref 6.5–8.1)

## 2021-11-11 MED ORDER — FAMOTIDINE 20 MG PO TABS: 20.0000 mg | ORAL_TABLET | Freq: Every day | ORAL | Status: AC | PRN

## 2021-11-11 MED ORDER — ASPIRIN 81 MG PO CHEW
81.0000 mg | CHEWABLE_TABLET | Freq: Every day | ORAL | 0 refills | Status: AC
Start: 2021-11-12 — End: ?

## 2021-11-11 MED ORDER — FOLIC ACID 1 MG PO TABS: 1.0000 mg | ORAL_TABLET | Freq: Every day | ORAL | 0 refills | Status: AC

## 2021-11-11 MED ORDER — THIAMINE HCL 100 MG PO TABS: 100.0000 mg | ORAL_TABLET | Freq: Every day | ORAL | 0 refills | Status: AC

## 2021-11-11 MED ORDER — POTASSIUM CHLORIDE CRYS ER 20 MEQ PO TBCR
20.0000 meq | EXTENDED_RELEASE_TABLET | Freq: Once | ORAL | Status: AC
  Administered 2021-11-11: 20 meq via ORAL
  Filled 2021-11-11: qty 1

## 2021-11-11 MED ORDER — ATORVASTATIN CALCIUM 40 MG PO TABS: 40.0000 mg | ORAL_TABLET | Freq: Every day | ORAL | 0 refills | Status: AC

## 2021-11-11 NOTE — Progress Notes (Signed)
Discharged to home after IV access removed and instructions reviewed with pt and spouse.

## 2021-11-11 NOTE — Plan of Care (Signed)
  Problem: Education: Goal: Knowledge of General Education information will improve Description: Including pain rating scale, medication(s)/side effects and non-pharmacologic comfort measures Outcome: Adequate for Discharge   Problem: Health Behavior/Discharge Planning: Goal: Ability to manage health-related needs will improve Outcome: Adequate for Discharge   Problem: Clinical Measurements: Goal: Ability to maintain clinical measurements within normal limits will improve Outcome: Adequate for Discharge Goal: Will remain free from infection Outcome: Adequate for Discharge Goal: Diagnostic test results will improve Outcome: Adequate for Discharge Goal: Respiratory complications will improve Outcome: Adequate for Discharge Goal: Cardiovascular complication will be avoided Outcome: Adequate for Discharge   Problem: Activity: Goal: Risk for activity intolerance will decrease Outcome: Adequate for Discharge   Problem: Nutrition: Goal: Adequate nutrition will be maintained Outcome: Adequate for Discharge   Problem: Coping: Goal: Level of anxiety will decrease Outcome: Adequate for Discharge   Problem: Elimination: Goal: Will not experience complications related to bowel motility Outcome: Adequate for Discharge Goal: Will not experience complications related to urinary retention Outcome: Adequate for Discharge   Problem: Pain Managment: Goal: General experience of comfort will improve Outcome: Adequate for Discharge   Problem: Safety: Goal: Ability to remain free from injury will improve Outcome: Adequate for Discharge   Problem: Skin Integrity: Goal: Risk for impaired skin integrity will decrease Outcome: Adequate for Discharge   Problem: Education: Goal: Knowledge of disease or condition will improve Outcome: Adequate for Discharge Goal: Knowledge of secondary prevention will improve (SELECT ALL) Outcome: Adequate for Discharge Goal: Knowledge of patient specific  risk factors will improve (INDIVIDUALIZE FOR PATIENT) Outcome: Adequate for Discharge   Problem: Coping: Goal: Will verbalize positive feelings about self Outcome: Adequate for Discharge Goal: Will identify appropriate support needs Outcome: Adequate for Discharge   Problem: Health Behavior/Discharge Planning: Goal: Ability to manage health-related needs will improve Outcome: Adequate for Discharge   Problem: Self-Care: Goal: Ability to participate in self-care as condition permits will improve Outcome: Adequate for Discharge Goal: Verbalization of feelings and concerns over difficulty with self-care will improve Outcome: Adequate for Discharge Goal: Ability to communicate needs accurately will improve Outcome: Adequate for Discharge   Problem: Nutrition: Goal: Risk of aspiration will decrease Outcome: Adequate for Discharge Goal: Dietary intake will improve Outcome: Adequate for Discharge   Problem: Ischemic Stroke/TIA Tissue Perfusion: Goal: Complications of ischemic stroke/TIA will be minimized Outcome: Adequate for Discharge

## 2021-11-11 NOTE — Progress Notes (Addendum)
STROKE TEAM PROGRESS NOTE   INTERVAL HISTORY Patient is seen in her room with no family at the bedside.  diplopia improved subjectively.  She has been hemodynamically stable, and her neurological exam is stable.    Vitals:   11/11/21 0400 11/11/21 0400 11/11/21 0744 11/11/21 1129  BP: (!) 143/83 (!) 143/83 (!) 130/97 126/81  Pulse: 76 81 75 95  Resp: 14 14 13 19   Temp: (!) 97.5 F (36.4 C) (!) 97.5 F (36.4 C) 98.1 F (36.7 C) 98.5 F (36.9 C)  TempSrc: Oral Oral Oral Oral  SpO2: 100% 100% 100% 100%  Weight:      Height:       CBC:  Recent Labs  Lab 11/09/21 1421  WBC 8.5  NEUTROABS 6.0  HGB 12.3  HCT 37.7  MCV 107.4*  PLT 161   Basic Metabolic Panel:  Recent Labs  Lab 11/09/21 1421 11/11/21 0058  NA 136 135  K 3.6 3.4*  CL 98 99  CO2 20* 21*  GLUCOSE 77 87  BUN 13 8  CREATININE 0.73 0.74  CALCIUM 9.4 9.5   Lipid Panel:  Recent Labs  Lab 11/10/21 0831  CHOL 276*  TRIG 118  HDL 126  CHOLHDL 2.2  VLDL 24  LDLCALC 126*   HgbA1c:  Recent Labs  Lab 11/10/21 0831  HGBA1C 4.4*   Urine Drug Screen:  Recent Labs  Lab 11/10/21 1359  LABOPIA NONE DETECTED  COCAINSCRNUR POSITIVE*  LABBENZ NONE DETECTED  AMPHETMU NONE DETECTED  THCU POSITIVE*  LABBARB NONE DETECTED    Alcohol Level No results for input(s): ETH in the last 168 hours.  IMAGING past 24 hours No results found.  PHYSICAL EXAM General:  Thin-appearing female in no acute distress   NEURO:  Mental Status: AA&Ox3  Speech/Language: speech is without dysarthria or aphasia.  Naming, repetition, fluency, and comprehension intact.  Cranial Nerves:  II: PERRL. Visual fields full. III, IV, VI: Dysconjugate gaze with right eye not following left.  Denies diplopia on today's exam. Left INO. V: Sensation is intact to light touch and symmetrical to face.  VII: Smile is symmetrical.  VIII: hearing intact to voice. IX, X: Phonation is normal.  XII: tongue is midline without  fasciculations. Motor: 5/5 strength to all muscle groups tested.  Tone: is normal and bulk is normal Sensation- Intact to light touch bilaterally. Extinction absent to light touch to DSS.  Coordination: No drift.  Gait- deferred    ASSESSMENT/PLAN Ms. GENOA FREYRE is a 60 y.o. female with history of GERD, HTN, HLD, hypothyroidism, TB, ETOH use, cocaine use and protein calorie malnutrition presenting with diplopia and inability to walk.   Stroke:  likely small pontine or brainstem stroke not seen on MRI secondary to small vessel disease in setting of uncontrolled hypertension  CT head No acute abnormality. Small vessel disease. Atrophy.  MRI  No acute intracranial abnormality, extensive atrophy MRA  Atherosclerotic irregularity of bilateral M2 and distal MCA vessels Carotid Doppler  pending 2D Echo EF 09-60%, grade 1 diastolic dysfunction, no atrial level shunt MRI orbits negative for abnormalities LDL 126 HgbA1c No results found for requested labs within last 26280 hours. VTE prophylaxis - SCDs    Diet   Diet Heart Room service appropriate? Yes; Fluid consistency: Thin   No antithrombotic prior to admission, now on aspirin 81 mg daily.  Therapy recommendations:  out pt PT Disposition:  pending  Hypertension Home meds:  none Stable Permissive hypertension (OK if < 220/120) but  gradually normalize in 5-7 days Long-term BP goal normotensive  Hyperlipidemia Home meds:  none LDL 126, goal < 70 atorvastatin 40 mg daily  Continue statin at discharge  ETOH use Patient reports last drink was a few days ago CIWA protocol  Other Stroke Risk Factors Cigarette smoker, advised to stop smoking ETOH use, alcohol level No results found for requested labs within last 26280 hours., advised to drink no more than 1 drink(s) a day Substance abuse - UDS:  THC No results found for requested labs within last 26280 hours., Cocaine No results found for requested labs within last 26280  hours.. Patient advised to stop using due to stroke risk.  Other Active Problems none  Hospital day # 2 Pt with left INO on exam. MRI shows atrophy and left pontine CVA. MRA neg. Discharge home today.  Can f/u in stroke clinic with Dr. Leonie Man in 4-6 weeks.  Total 30 minutes spent on counseling patient and coordinating care, writing notes and reviewing chart.  Keefer Soulliere,MD     To contact Stroke Continuity provider, please refer to [email or web address] After hours, contact General Neurology

## 2021-11-11 NOTE — Progress Notes (Signed)
Physical Therapy Treatment Patient Details Name: Holly Phelps MRN: [id number] DOB: 1961-10-29 Today's Date: 11/11/2021   History of Present Illness Patient is a 60 y/o female who presents on 11/09/21 with diploplia and inability to walk. Admitted with likely small pontine or brainstem stroke not seen on MRI secondary to small vessel disease in setting of uncontrolled hypertension. Brain MRA-Atherosclerotic irregularity of the M2 and more distal MCA vessels bilaterally. PMH includes HTN TB, ETOH use, cocaine use and protein calorie malnutrition.    PT Comments    Pt making progress. Both her and her husband feel ready for DC from a mobility standpoint. Do recommend f/u PT to work on gait and balance issues.  I have answered all patient's question regarding PT and mobility.        Recommendations for follow up therapy are one component of a multi-disciplinary discharge planning process, led by the attending physician.  Recommendations may be updated based on patient status, additional functional criteria and insurance authorization.  Follow Up Recommendations  Outpatient PT     Assistance Recommended at Discharge Frequent or constant Supervision/Assistance  Equipment Recommendations  Rolling walker (2 wheels) (Pt has a new WC that she can use)    Recommendations for Other Services       Precautions / Restrictions Precautions Precautions: Fall Restrictions Weight Bearing Restrictions: No     Mobility  Bed Mobility Overal bed mobility: Needs Assistance Bed Mobility: Sit to Supine     Supine to sit: Supervision Sit to supine: Modified independent (Device/Increase time)        Transfers Overall transfer level: Needs assistance Equipment used: Rolling walker (2 wheels) Transfers: Sit to/from Stand Sit to Stand: Supervision                Ambulation/Gait Ambulation/Gait assistance: Supervision Gait Distance (Feet): 150 Feet Assistive device: Rolling walker (2  wheels) Gait Pattern/deviations: Decreased stride length Gait velocity: decreased Gait velocity interpretation: <1.8 ft/sec, indicate of risk for recurrent falls   General Gait Details: very short steps, cues to not push RW too far in front of her   Stairs Stairs:  (Pt declined practicing stairs)           Wheelchair Mobility    Modified Rankin (Stroke Patients Only) Modified Rankin (Stroke Patients Only) Pre-Morbid Rankin Score: Slight disability Modified Rankin: Moderately severe disability     Balance Overall balance assessment: Mild deficits observed, not formally tested                                          Cognition Arousal/Alertness: Awake/alert Behavior During Therapy: WFL for tasks assessed/performed Overall Cognitive Status: History of cognitive impairments - at baseline                                 General Comments: Liekly less anxious today regarding mobility and vision        Exercises      General Comments General comments (skin integrity, edema, etc.): husband present      Pertinent Vitals/Pain Pain Assessment: 0-10 Pain Descriptors / Indicators:  (C/O right foot feeling asleep)    Home Living                          Prior Function  PT Goals (current goals can now be found in the care plan section) Acute Rehab PT Goals Patient Stated Goal: To go home today Progress towards PT goals: Progressing toward goals    Frequency           PT Plan Current plan remains appropriate    Co-evaluation              AM-PAC PT "6 Clicks" Mobility   Outcome Measure  Help needed turning from your back to your side while in a flat bed without using bedrails?: None Help needed moving from lying on your back to sitting on the side of a flat bed without using bedrails?: None Help needed moving to and from a bed to a chair (including a wheelchair)?: A Little Help needed standing up  from a chair using your arms (e.g., wheelchair or bedside chair)?: A Little Help needed to walk in hospital room?: A Little Help needed climbing 3-5 steps with a railing? : A Little 6 Click Score: 20    End of Session Equipment Utilized During Treatment: Gait belt Activity Tolerance: Patient tolerated treatment well Patient left: in bed;with call bell/phone within reach;with bed alarm set;with family/visitor present Nurse Communication: Mobility status PT Visit Diagnosis: Difficulty in walking, not elsewhere classified (R26.2);Muscle weakness (generalized) (M62.81);Pain     Time: 6720-9470 PT Time Calculation (min) (ACUTE ONLY): 24 min  Charges:  $Gait Training: 23-37 mins                     Lavonia Dana, PT   Acute Rehabilitation Services  Pager [phone] Office [phone] 11/11/2021    Melvern Banker 11/11/2021, 10:00 AM

## 2021-11-11 NOTE — TOC Transition Note (Signed)
Transition of Care [facility]) - CM/SW Discharge Note   Patient Details  Name: Holly Phelps MRN: [id number] Date of Birth: 23-Jun-1961  [facility] CM/SW Contact:  Carles Collet, RN Phone Number: 11/11/2021, 1:12 PM   Clinical Narrative:    Spoke w patient at bedside. Discussed DC plan. Agreeable to OP therapies, orders have been placed. No other CM needs identified     Barriers to Discharge: Continued Medical Work up   Patient Goals and CMS Choice        Discharge Placement                       Discharge Plan and Services                                     Social Determinants of Health (SDOH) Interventions     Readmission Risk Interventions No flowsheet data found.

## 2021-11-11 NOTE — Discharge Summary (Signed)
Physician Discharge Summary  Holly Phelps:[id number] DOB: Dec 05, 1960 DOA: 11/09/2021  PCP: Maximiano Coss, NP  Admit date: 11/09/2021 Discharge date: 11/11/2021  Admitted From: Home Disposition: Home  Recommendations for Outpatient Follow-up:  Follow up with PCP in 1 week with repeat CBC/BMP Outpatient follow-up with neurology Comply with medications and follow-up Abstain from illicit drug use Follow up in ED if symptoms worsen or new appear   Home Health: No.  Will need outpatient PT  equipment/Devices: None  Discharge Condition: Stable CODE STATUS: Full Diet recommendation: Heart healthy  Brief/Interim Summary: 60 year old female with tobacco, EtOH, prior cocaine use comes to the hospital with complaints of weakness, inability to walk without family's assistance as well as double vision.  She has not seen a doctor in several years, not taking any medications.  She denies any prior diagnosis of medical conditions.  Neurology was consulted.  MRI of brain showed possible left pontine CVA.  Patient has been started on aspirin and statin.  Neurology recommended outpatient follow-up with neurology.  She will need outpatient PT follow-up.  Neurology has cleared the patient for discharge.  Discharge patient home today.  Discharge Diagnoses:   Probable CVA -Neurology evaluated and followed the patient and thought that patient probably had left pontine CVA although MRI did not show any acute intracranial abnormality.  MRA showed atherosclerotic irregularity of bilateral M2 and distal MCA vessels. -Echo showed EF of 55 to 60% with grade 1 diastolic dysfunction -LDL 126.  A1c 4.4. -Currently on aspirin and statin.  Neurology has cleared the patient for discharge on the same.  Outpatient follow-up with neurology.  Will need outpatient PT follow-up.  Tobacco abuse Polysubstance abuse -Consult regarding cessation.  Urine drug screen positive for cocaine and  tetrahydrocannabinol.  Alcohol use -Apparently drinks every other day, 1-2 shots.  Continue thiamine and folic acid on discharge  Hyperlipidemia -Continue statin on discharge  Essential hypertension -Blood pressure intermittently on the higher side.  Allow for permissive hypertension for now for 5 to 7 days.  Outpatient follow-up with PCP regarding need for antihypertensives.   Discharge Instructions  Discharge Instructions     Ambulatory referral to Neurology   Complete by: As directed    An appointment is requested in approximately: 2 weeks   Ambulatory referral to Physical Therapy   Complete by: As directed    Diet - low sodium heart healthy   Complete by: As directed    Increase activity slowly   Complete by: As directed       Allergies as of 11/11/2021       Reactions   Codeine Nausea And Vomiting   Flexeril [cyclobenzaprine] Itching   Sulfa Antibiotics    Pt does not recall what the reaction was,been too long   Synthroid [levothyroxine] Rash        Medication List     STOP taking these medications    indomethacin 50 MG capsule Commonly known as: INDOCIN   naproxen 500 MG tablet Commonly known as: NAPROSYN   promethazine 25 MG suppository Commonly known as: PHENERGAN       TAKE these medications    acetaminophen 500 MG tablet Commonly known as: TYLENOL Take 1,000 mg by mouth every 6 (six) hours as needed.   allopurinol 100 MG tablet Commonly known as: ZYLOPRIM Take 1 tablet (100 mg total) by mouth daily.   aspirin 81 MG chewable tablet Chew 1 tablet (81 mg total) by mouth daily. Start taking on: November 12, 2021  atorvastatin 40 MG tablet Commonly known as: LIPITOR Take 1 tablet (40 mg total) by mouth daily. Start taking on: November 12, 2021   famotidine 20 MG tablet Commonly known as: PEPCID Take 1 tablet (20 mg total) by mouth daily as needed for heartburn.   folic acid 1 MG tablet Commonly known as: FOLVITE Take 1 tablet (1  mg total) by mouth daily. Start taking on: November 12, 2021   MULTIVITAMIN ADULTS PO Take 1 tablet by mouth daily.   thiamine 100 MG tablet Take 1 tablet (100 mg total) by mouth daily. Start taking on: November 12, 2021   Visine Dry Eye Relief 1 % Soln Generic drug: Polyethylene Glycol 400 Place 1 drop into both eyes daily as needed (dry eyes).        Follow-up Information     Maximiano Coss, NP. Schedule an appointment as soon as possible for a visit in 1 week(s).   Specialty: Adult Health Nurse Practitioner Contact information: [place] [address] [phone]                Allergies  Allergen Reactions   Codeine Nausea And Vomiting   Flexeril [Cyclobenzaprine] Itching   Sulfa Antibiotics     Pt does not recall what the reaction was,been too long   Synthroid [Levothyroxine] Rash    Consultations: Neurology   Procedures/Studies: CT Head Wo Contrast  Result Date: 11/09/2021 CLINICAL DATA:  Dizziness EXAM: CT HEAD WITHOUT CONTRAST TECHNIQUE: Contiguous axial images were obtained from the base of the skull through the vertex without intravenous contrast. COMPARISON:  10/06/2021 FINDINGS: Brain: No evidence of acute infarction, hemorrhage, extra-axial collection, ventriculomegaly, or mass effect. Generalized cerebral atrophy. Periventricular white matter low attenuation likely secondary to microangiopathy. Vascular: Cerebrovascular atherosclerotic calcifications are noted. Skull: Negative for fracture or focal lesion. Sinuses/Orbits: Visualized portions of the orbits are unremarkable. Visualized portions of the paranasal sinuses are unremarkable. Visualized portions of the mastoid air cells are unremarkable. Other: None. IMPRESSION: 1. No acute intracranial pathology. 2. Chronic microvascular disease and cerebral atrophy. Electronically Signed   By: Kathreen Devoid M.D.   On: 11/09/2021 15:00   MR ANGIO HEAD WO CONTRAST  Result Date:  11/10/2021 CLINICAL DATA:  Provided history: Neuro deficit, acute, stroke suspected. EXAM: MRA HEAD WITHOUT CONTRAST TECHNIQUE: Angiographic images of the Circle of Willis were acquired using MRA technique without intravenous contrast. COMPARISON:  Brain MRI 11/09/2021.  MRI of the orbits 11/09/2021. FINDINGS: Mildly motion degraded exam. Anterior circulation: The intracranial internal carotid arteries are patent. The M1 middle cerebral arteries are patent. Atherosclerotic irregularity of the M2 and more distal middle cerebral artery vessels, bilaterally. No M2 proximal branch occlusion or high-grade proximal stenosis is identified. The anterior cerebral arteries are patent. No intracranial aneurysm is identified. Posterior circulation: The intracranial vertebral arteries are patent. The basilar artery is patent. The posterior cerebral arteries are patent. The right PCA is fetal in origin. The left posterior communicating artery is diminutive or absent. Anatomic variants: As described. IMPRESSION: No intracranial large vessel occlusion or proximal high-grade arterial stenosis. Atherosclerotic irregularity of the M2 and more distal MCA vessels, bilaterally. Electronically Signed   By: Kellie Simmering D.O.   On: 11/10/2021 10:26   MR ANGIO NECK W WO CONTRAST  Result Date: 11/10/2021 CLINICAL DATA:  Neuro deficit, acute, stroke suspected. EXAM: MRA NECK WITHOUT AND WITH CONTRAST TECHNIQUE: Multiplanar and multiecho pulse sequences of the neck were obtained without and with intravenous contrast. Angiographic images of the neck  were obtained using MRA technique without and with intravenous contrast. CONTRAST:  4.61mL GADAVIST GADOBUTROL 1 MMOL/ML IV SOLN COMPARISON:  Same day MRA head 11/10/2021. MRI of the orbits 11/09/2021. MRI of the head 11/09/2021. FINDINGS: Common origin of the innominate and left common carotid arteries. The common carotid and internal carotid arteries are patent within the neck without  hemodynamically significant stenosis (50% or greater). Mild atherosclerotic irregularity of the carotid bifurcations/proximal ICAs, bilaterally. Vertebral arteries codominant and patent within the neck without appreciable significant stenosis. IMPRESSION: The common carotid and internal carotid arteries are patent within the neck without hemodynamically significant stenosis. Mild atherosclerotic irregularity of the carotid bifurcations/proximal ICAs, bilaterally. Vertebral arteries patent within the neck without significant stenosis. Electronically Signed   By: Kellie Simmering D.O.   On: 11/10/2021 10:36   MR Brain W and Wo Contrast  Result Date: 11/09/2021 CLINICAL DATA:  Diplopia.  Head injury 1 month ago. EXAM: MRI HEAD AND ORBITS WITHOUT AND WITH CONTRAST TECHNIQUE: Multiplanar, multiecho pulse sequences of the brain and surrounding structures were obtained without and with intravenous contrast. Multiplanar, multiecho pulse sequences of the orbits and surrounding structures were obtained including fat saturation techniques, before and after intravenous contrast administration. CONTRAST:  71mL GADAVIST GADOBUTROL 1 MMOL/ML IV SOLN COMPARISON:  CT head 11/09/2021 FINDINGS: MRI HEAD FINDINGS Brain: Advanced cerebral atrophy. Ventricular enlargement consistent with the level of atrophy. Negative for acute infarct. Extensive chronic microvascular ischemic change in the white matter and pons. Multiple areas of chronic microhemorrhage including in the pons bilaterally and in both cerebral hemispheres. Enhancing vein in the left parietal lobe compatible with developmental venous anomaly 2 mm enhancement in the central pons. 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 Vascular: Normal arterial flow voids. Skull and upper cervical spine: No focal skeletal lesion. ACDF cervical spine. Other: None MRI ORBITS FINDINGS Orbits: No orbital mass or edema. Optic nerve normal bilaterally. Extraocular  muscles normal bilaterally. No enhancing lesion in the orbit. Optic chiasm normal. Pituitary not enlarged. Visualized sinuses: Clear Soft tissues: No soft tissue swelling or mass IMPRESSION: 1. No acute intracranial abnormality. 2. Extensive atrophy. Extensive chronic microvascular ischemia. Multiple areas of chronic microhemorrhage in the brain including the pons 3. 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   On: 11/09/2021 18:50   ECHOCARDIOGRAM COMPLETE  Result Date: 11/10/2021    ECHOCARDIOGRAM REPORT   Patient Name:   ELENOR WILDES Date of Exam: 11/10/2021 Medical Rec #:  [id number]        Height:       62.0 in Accession #:    [id number]       Weight:       110.0 lb Date of Birth:  Feb 07, 1961        BSA:          1.483 m Patient Age:    67 years         BP:           156/101 mmHg Patient Gender: F                HR:           81 bpm. Exam Location:  Inpatient Procedure: 2D Echo, Color Doppler and Cardiac Doppler Indications:    TIA  History:        Patient has no prior history of Echocardiogram examinations.  Risk Factors:Hypertension and h/o polysubstance abuse.  Sonographer:    Raquel Sarna Senior RDCS Referring Phys: [id number] [place]  1. Left ventricular ejection fraction, by estimation, is 55 to 60%. The left ventricle has normal function. Left ventricular endocardial border not optimally defined to evaluate regional wall motion. Left ventricular diastolic parameters are consistent with Grade I diastolic dysfunction (impaired relaxation). Elevated left atrial pressure.  2. Right ventricular systolic function is normal. The right ventricular size is normal. There is normal pulmonary artery systolic pressure.  3. The mitral valve is normal in structure. No evidence of mitral valve regurgitation. No evidence of mitral stenosis.  4.  Tricuspid valve regurgitation is mild to moderate.  5. The aortic valve is normal in structure. Aortic valve regurgitation is not visualized. No aortic stenosis is present.  6. The inferior vena cava is normal in size with greater than 50% respiratory variability, suggesting right atrial pressure of 3 mmHg. FINDINGS  Left Ventricle: Left ventricular ejection fraction, by estimation, is 55 to 60%. The left ventricle has normal function. Left ventricular endocardial border not optimally defined to evaluate regional wall motion. The left ventricular internal cavity size was normal in size. There is no left ventricular hypertrophy. Left ventricular diastolic parameters are consistent with Grade I diastolic dysfunction (impaired relaxation). Elevated left atrial pressure. Right Ventricle: The right ventricular size is normal. No increase in right ventricular wall thickness. Right ventricular systolic function is normal. There is normal pulmonary artery systolic pressure. The tricuspid regurgitant velocity is 2.23 m/s, and  with an assumed right atrial pressure of 3 mmHg, the estimated right ventricular systolic pressure is 17.4 mmHg. Left Atrium: Left atrial size was normal in size. Right Atrium: Right atrial size was normal in size. Pericardium: There is no evidence of pericardial effusion. Mitral Valve: The mitral valve is normal in structure. No evidence of mitral valve regurgitation. No evidence of mitral valve stenosis. Tricuspid Valve: The tricuspid valve is normal in structure. Tricuspid valve regurgitation is mild to moderate. No evidence of tricuspid stenosis. Aortic Valve: The aortic valve is normal in structure. Aortic valve regurgitation is not visualized. No aortic stenosis is present. Pulmonic Valve: The pulmonic valve was normal in structure. Pulmonic valve regurgitation is not visualized. No evidence of pulmonic stenosis. Aorta: The aortic root is normal in size and structure. Venous: The inferior vena  cava is normal in size with greater than 50% respiratory variability, suggesting right atrial pressure of 3 mmHg. IAS/Shunts: No atrial level shunt detected by color flow Doppler.  LEFT VENTRICLE PLAX 2D LVIDd:         2.00 cm   Diastology LVIDs:         1.30 cm   LV e' medial:    6.31 cm/s LV PW:         1.20 cm   LV E/e' medial:  10.3 LV IVS:        1.00 cm   LV e' lateral:   8.81 cm/s LVOT diam:     1.80 cm   LV E/e' lateral: 7.4 LV SV:         33 LV SV Index:   22 LVOT Area:     2.54 cm  RIGHT VENTRICLE RV S prime:     11.50 cm/s TAPSE (M-mode): 2.7 cm LEFT ATRIUM             Index        RIGHT ATRIUM  Index LA diam:        1.50 cm 1.01 cm/m   RA Area:     11.60 cm LA Vol (A2C):   24.4 ml 16.46 ml/m  RA Volume:   26.90 ml  18.14 ml/m LA Vol (A4C):   13.5 ml 9.10 ml/m LA Biplane Vol: 19.9 ml 13.42 ml/m  AORTIC VALVE LVOT Vmax:   82.20 cm/s LVOT Vmean:  51.700 cm/s LVOT VTI:    0.131 m  AORTA Ao Root diam: 3.00 cm MITRAL VALVE               TRICUSPID VALVE MV Area (PHT): 2.42 cm    TR Peak grad:   19.9 mmHg MV Decel Time: 313 msec    TR Vmax:        223.00 cm/s MV E velocity: 65.10 cm/s MV A velocity: 82.70 cm/s  SHUNTS MV E/A ratio:  0.79        Systemic VTI:  0.13 m                            Systemic Diam: 1.80 cm Dani Gobble Croitoru MD Electronically signed by Sanda Klein MD Signature Date/Time: 11/10/2021/1:35:32 PM    Final    MR ORBITS W WO CONTRAST  Result Date: 11/09/2021 CLINICAL DATA:  Diplopia.  Head injury 1 month ago. EXAM: MRI HEAD AND ORBITS WITHOUT AND WITH CONTRAST TECHNIQUE: Multiplanar, multiecho pulse sequences of the brain and surrounding structures were obtained without and with intravenous contrast. Multiplanar, multiecho pulse sequences of the orbits and surrounding structures were obtained including fat saturation techniques, before and after intravenous contrast administration. CONTRAST:  40mL GADAVIST GADOBUTROL 1 MMOL/ML IV SOLN COMPARISON:  CT head 11/09/2021  FINDINGS: MRI HEAD FINDINGS Brain: Advanced cerebral atrophy. Ventricular enlargement consistent with the level of atrophy. Negative for acute infarct. Extensive chronic microvascular ischemic change in the white matter and pons. Multiple areas of chronic microhemorrhage including in the pons bilaterally and in both cerebral hemispheres. Enhancing vein in the left parietal lobe compatible with developmental venous anomaly 2 mm enhancement in the central pons. 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 Vascular: Normal arterial flow voids. Skull and upper cervical spine: No focal skeletal lesion. ACDF cervical spine. Other: None MRI ORBITS FINDINGS Orbits: No orbital mass or edema. Optic nerve normal bilaterally. Extraocular muscles normal bilaterally. No enhancing lesion in the orbit. Optic chiasm normal. Pituitary not enlarged. Visualized sinuses: Clear Soft tissues: No soft tissue swelling or mass IMPRESSION: 1. No acute intracranial abnormality. 2. Extensive atrophy. Extensive chronic microvascular ischemia. Multiple areas of chronic microhemorrhage in the brain including the pons 3. 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   On: 11/09/2021 18:50      Subjective: Patient seen and examined at bedside.  Denies fever, nausea, vomiting.  Feels okay to go home today.  Discharge Exam: Vitals:   11/11/21 0744 11/11/21 1129  BP: (!) 130/97 126/81  Pulse: 75 95  Resp: 13 19  Temp: 98.1 F (36.7 C) 98.5 F (36.9 C)  SpO2: 100% 100%    General: Pt is alert, awake, not in acute distress.  Looks chronically ill and deconditioned.  Currently on room air.  Poor historian.  Slow to respond. Cardiovascular: rate controlled, S1/S2 + Respiratory: bilateral decreased breath sounds at bases Abdominal: Soft, NT, ND, bowel sounds +  Extremities: no  edema, no cyanosis    The results of significant diagnostics from this hospitalization (including imaging, microbiology, ancillary and laboratory) are listed below for reference.     Microbiology: Recent Results (from the past 240 hour(s))  Resp Panel by RT-PCR (Flu A&B, Covid) Nasopharyngeal Swab     Status: None   Collection Time: 11/09/21  4:40 PM   Specimen: Nasopharyngeal Swab; Nasopharyngeal(NP) swabs in vial transport medium  Result Value Ref Range Status   SARS Coronavirus 2 by RT PCR NEGATIVE NEGATIVE Final    Comment: (NOTE) SARS-CoV-2 target nucleic acids are NOT DETECTED.  The SARS-CoV-2 RNA is generally detectable in upper respiratory specimens during the acute phase of infection. The lowest concentration of SARS-CoV-2 viral copies this assay can detect is 138 copies/mL. A negative result does not preclude SARS-Cov-2 infection and should not be used as the sole basis for treatment or other patient management decisions. A negative result may occur with  improper specimen collection/handling, submission of specimen other than nasopharyngeal swab, presence of viral mutation(s) within the areas targeted by this assay, and inadequate number of viral copies(<138 copies/mL). A negative result must be combined with clinical observations, patient history, and epidemiological information. The expected result is Negative.  Fact Sheet for Patients:  EntrepreneurPulse.com.au  Fact Sheet for Healthcare Providers:  IncredibleEmployment.be  This test is no t yet approved or cleared by the [place] FDA and  has been authorized for detection and/or diagnosis of SARS-CoV-2 by FDA under an Emergency Use Authorization (EUA). This EUA will remain  in effect (meaning this test can be used) for the duration of the COVID-19 declaration under Section 564(b)(1) of the Act, 21 U.S.C.section 360bbb-3(b)(1), unless the authorization is terminated  or  revoked sooner.       Influenza A by PCR NEGATIVE NEGATIVE Final   Influenza B by PCR NEGATIVE NEGATIVE Final    Comment: (NOTE) The Xpert Xpress SARS-CoV-2/FLU/RSV plus assay is intended as an aid in the diagnosis of influenza from Nasopharyngeal swab specimens and should not be used as a sole basis for treatment. Nasal washings and aspirates are unacceptable for Xpert Xpress SARS-CoV-2/FLU/RSV testing.  Fact Sheet for Patients: EntrepreneurPulse.com.au  Fact Sheet for Healthcare Providers: IncredibleEmployment.be  This test is not yet approved or cleared by the [place] FDA and has been authorized for detection and/or diagnosis of SARS-CoV-2 by FDA under an Emergency Use Authorization (EUA). This EUA will remain in effect (meaning this test can be used) for the duration of the COVID-19 declaration under Section 564(b)(1) of the Act, 21 U.S.C. section 360bbb-3(b)(1), unless the authorization is terminated or revoked.  Performed at [facility], [place] [address], [place], [place] [address]      Labs: BNP (last 3 results) No results for input(s): BNP in the last 8760 hours. Basic Metabolic Panel: Recent Labs  Lab 11/09/21 1421 11/11/21 0058  NA 136 135  K 3.6 3.4*  CL 98 99  CO2 20* 21*  GLUCOSE 77 87  BUN 13 8  CREATININE 0.73 0.74  CALCIUM 9.4 9.5   Liver Function Tests: Recent Labs  Lab 11/09/21 1421 11/11/21 0058  AST 78* 54*  ALT 48* 33  ALKPHOS 107 102  BILITOT 1.4* 0.8  PROT 8.0 6.6  ALBUMIN 4.5 3.6   No results for input(s): LIPASE, AMYLASE in the last 168 hours. No results for input(s): AMMONIA in the last 168 hours. CBC: Recent Labs  Lab 11/09/21 1421  WBC 8.5  NEUTROABS  6.0  HGB 12.3  HCT 37.7  MCV 107.4*  PLT 294   Cardiac Enzymes: No results for input(s): CKTOTAL, CKMB, CKMBINDEX, TROPONINI in the last 168 hours. BNP: Invalid input(s): POCBNP CBG: Recent Labs  Lab  11/09/21 1407 11/09/21 2017 11/10/21 0630  GLUCAP 74 66* 73   D-Dimer No results for input(s): DDIMER in the last 72 hours. Hgb A1c Recent Labs    11/10/21 0831  HGBA1C 4.4*   Lipid Profile Recent Labs    11/10/21 0831  CHOL 276*  HDL 126  LDLCALC 126*  TRIG 118  CHOLHDL 2.2   Thyroid function studies No results for input(s): TSH, T4TOTAL, T3FREE, THYROIDAB in the last 72 hours.  Invalid input(s): FREET3 Anemia work up No results for input(s): VITAMINB12, FOLATE, FERRITIN, TIBC, IRON, RETICCTPCT in the last 72 hours. Urinalysis    Component Value Date/Time   COLORURINE STRAW (A) 04/29/2021 0853   APPEARANCEUR CLEAR 04/29/2021 0853   LABSPEC 1.015 04/29/2021 0853   PHURINE 6.0 04/29/2021 0853   GLUCOSEU NEGATIVE 04/29/2021 0853   HGBUR NEGATIVE 04/29/2021 0853   BILIRUBINUR NEGATIVE 04/29/2021 0853   BILIRUBINUR mod 07/24/2015 1220   KETONESUR 80 (A) 04/29/2021 0853   PROTEINUR NEGATIVE 04/29/2021 0853   UROBILINOGEN 0.2 10/01/2015 1351   NITRITE NEGATIVE 04/29/2021 0853   LEUKOCYTESUR NEGATIVE 04/29/2021 0853   Sepsis Labs Invalid input(s): PROCALCITONIN,  WBC,  LACTICIDVEN Microbiology Recent Results (from the past 240 hour(s))  Resp Panel by RT-PCR (Flu A&B, Covid) Nasopharyngeal Swab     Status: None   Collection Time: 11/09/21  4:40 PM   Specimen: Nasopharyngeal Swab; Nasopharyngeal(NP) swabs in vial transport medium  Result Value Ref Range Status   SARS Coronavirus 2 by RT PCR NEGATIVE NEGATIVE Final    Comment: (NOTE) SARS-CoV-2 target nucleic acids are NOT DETECTED.  The SARS-CoV-2 RNA is generally detectable in upper respiratory specimens during the acute phase of infection. The lowest concentration of SARS-CoV-2 viral copies this assay can detect is 138 copies/mL. A negative result does not preclude SARS-Cov-2 infection and should not be used as the sole basis for treatment or other patient management decisions. A negative result may occur with   improper specimen collection/handling, submission of specimen other than nasopharyngeal swab, presence of viral mutation(s) within the areas targeted by this assay, and inadequate number of viral copies(<138 copies/mL). A negative result must be combined with clinical observations, patient history, and epidemiological information. The expected result is Negative.  Fact Sheet for Patients:  EntrepreneurPulse.com.au  Fact Sheet for Healthcare Providers:  IncredibleEmployment.be  This test is no t yet approved or cleared by the [place] FDA and  has been authorized for detection and/or diagnosis of SARS-CoV-2 by FDA under an Emergency Use Authorization (EUA). This EUA will remain  in effect (meaning this test can be used) for the duration of the COVID-19 declaration under Section 564(b)(1) of the Act, 21 U.S.C.section 360bbb-3(b)(1), unless the authorization is terminated  or revoked sooner.       Influenza A by PCR NEGATIVE NEGATIVE Final   Influenza B by PCR NEGATIVE NEGATIVE Final    Comment: (NOTE) The Xpert Xpress SARS-CoV-2/FLU/RSV plus assay is intended as an aid in the diagnosis of influenza from Nasopharyngeal swab specimens and should not be used as a sole basis for treatment. Nasal washings and aspirates are unacceptable for Xpert Xpress SARS-CoV-2/FLU/RSV testing.  Fact Sheet for Patients: EntrepreneurPulse.com.au  Fact Sheet for Healthcare Providers: IncredibleEmployment.be  This test is not yet approved or cleared  by the [place] and has been authorized for detection and/or diagnosis of SARS-CoV-2 by FDA under an Emergency Use Authorization (EUA). This EUA will remain in effect (meaning this test can be used) for the duration of the COVID-19 declaration under Section 564(b)(1) of the Act, 21 U.S.C. section 360bbb-3(b)(1), unless the authorization is terminated  or revoked.  Performed at [facility], [place] [address], [place], [place] [address]      Time coordinating discharge: 35 minutes  SIGNED:   Aline August, MD  [facility] 11/11/2021, 12:17 PM

## 2021-11-22 DIAGNOSIS — I639 Cerebral infarction, unspecified: Secondary | ICD-10-CM | POA: Diagnosis not present

## 2021-11-30 ENCOUNTER — Other Ambulatory Visit: Payer: Self-pay

## 2021-11-30 ENCOUNTER — Ambulatory Visit: Payer: 59 | Attending: Internal Medicine

## 2021-11-30 VITALS — BP 126/82

## 2021-11-30 DIAGNOSIS — R262 Difficulty in walking, not elsewhere classified: Secondary | ICD-10-CM | POA: Diagnosis not present

## 2021-11-30 DIAGNOSIS — R2681 Unsteadiness on feet: Secondary | ICD-10-CM

## 2021-11-30 DIAGNOSIS — M6281 Muscle weakness (generalized): Secondary | ICD-10-CM | POA: Diagnosis not present

## 2021-11-30 DIAGNOSIS — I639 Cerebral infarction, unspecified: Secondary | ICD-10-CM | POA: Diagnosis not present

## 2021-11-30 DIAGNOSIS — R2689 Other abnormalities of gait and mobility: Secondary | ICD-10-CM | POA: Diagnosis not present

## 2021-11-30 NOTE — Therapy (Signed)
[facility] [address] [place], [place], [address] Phone: [phone]   Fax:  [phone]  Physical Therapy Evaluation  Patient Details  Name: Holly Phelps MRN: [id number] Date of Birth: [birth year] Referring Provider (PT): Aline August, MD   Encounter Date: 11/30/2021   PT End of Session - 11/30/21 1044     Visit Number 1    Number of Visits 17    Date for PT Re-Evaluation 01/26/22    Authorization Type Aetna (VL: 68)    PT Start Time 1010    PT Stop Time 1055    PT Time Calculation (min) 45 min    Activity Tolerance Patient tolerated treatment well    Behavior During Therapy WFL for tasks assessed/performed             Past Medical History:  Diagnosis Date   Allergy    Anemia    Calcification of aorta ([place])    Fatty liver    GERD (gastroesophageal reflux disease)    Headache    Hypertension    Hypothyroidism    Tuberculosis    as baby    Past Surgical History:  Procedure Laterality Date   BACK SURGERY     x 2   COLONOSCOPY N/A 03/13/2013   Procedure: COLONOSCOPY;  Surgeon: Leighton Ruff, MD;  Location: [facility];  Service: Endoscopy;  Laterality: N/A;   LAPAROSCOPIC APPENDECTOMY      Vitals:   11/30/21 1025  BP: 126/82      Subjective Assessment - 11/30/21 1013     Subjective Presented to ED with reports of diplopia. Patient reports she has been home, has had some difficulty getting around the house. Reports she is using a cane in the house, and then using RW out in community, to go to appointments. No falls since been d/c home, but one in the past 6 months. Reports has a ramp placed in the house. Has some acheness in the legs.    Patient is accompained by: Family member   Husband Mortimer Fries)   Pertinent History TB, fatty liver, calcification of aorta, HA, HTN, remote h/o cocaine abuse, ETOH abuse, GERD, Hypothyroidism    Limitations Standing;House hold activities;Walking     Diagnostic tests MRI of the brain showed Extensive atrophy, Extensive chronic microvascular ischemia, Multiple areas of chronic microhemorrhage in the brain including the pons, 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    Patient Stated Goals Improve Walking; Get Back to Cooking    Currently in Pain? No/denies                [facility] PT Assessment - 11/30/21 0001       Assessment   Medical Diagnosis CVA    Referring Provider (PT) Aline August, MD    Onset Date/Surgical Date 11/11/21   referral date   Hand Dominance Right    Prior Therapy None      Precautions   Precautions Fall      Restrictions   Weight Bearing Restrictions No      Balance Screen   Has the patient fallen in the past 6 months Yes    How many times? 1    Has the patient had a decrease in activity level because of a fear of falling?  Yes    Is the patient reluctant to leave their home because of a fear of falling?  Yes      Home Environment  Living Environment Private residence    Living Arrangements Spouse/significant other    Available Help at Discharge Family    Type of [place] --   single step up (very short per husband reports)   Home Layout Multi-level   Has ramp inside the house to avoid stairs   [place] - 2 wheels;Grab bars - tub/shower;Grab bars - toilet;Shower seat    Additional Comments reports use of quad cane in the house, RW using outdoor in the community.      Prior Function   Level of Independence Independent    Vocation Retired      Associate Professor   Overall Cognitive Status --   patient/husband reports some difficulty with memory. denies need for speech therapy eval at this time.     Observation/Other Assessments   Focus on Therapeutic Outcomes (FOTO)  48%      Sensation   Light Touch Appears Intact   for BLEs     Coordination   Gross Motor Movements are Fluid  and Coordinated Yes    Heel Shin Test WNL      ROM / Strength   AROM / PROM / Strength Strength      Strength   Overall Strength Deficits    Strength Assessment Site Hip;Knee;Ankle    Right/Left Hip Right;Left    Right Hip Flexion 4-/5    Left Hip Flexion 3+/5    Right/Left Knee Right;Left    Right Knee Flexion 3+/5    Right Knee Extension 3+/5    Left Knee Flexion 3+/5    Left Knee Extension 3+/5    Right/Left Ankle Right;Left    Right Ankle Dorsiflexion 3+/5    Left Ankle Dorsiflexion 3+/5      Transfers   Transfers Sit to Stand;Stand to Sit    Sit to Stand 4: Min guard    Five time sit to stand comments  21.94 secs with BUE support from standard chair    Stand to Sit 4: Min guard      Ambulation/Gait   Ambulation/Gait Yes    Ambulation/Gait Assistance 5: Supervision;4: Min guard    Ambulation/Gait Assistance Details Completed ambulation with RW x 75 ft, rest breaks required due to fatigue. Paitent reports having some pain in bilat knees require rest breaks. Shortened step length noted bilaterally.    Ambulation Distance (Feet) 75 Feet    Assistive device Rolling walker    Gait Pattern Step-through pattern;Decreased step length - right;Decreased step length - left    Ambulation Surface Level;Indoor    Gait velocity 27.03 seconds with RW = 1.21 ft/sec      Standardized Balance Assessment   Standardized Balance Assessment Timed Up and Go Test;Berg Balance Test      Berg Balance Test   Sit to Stand Able to stand  independently using hands    Standing Unsupported Able to stand 2 minutes with supervision    Sitting with Back Unsupported but Feet Supported on Floor or Stool Able to sit safely and securely 2 minutes    Stand to Sit Controls descent by using hands    Transfers Able to transfer safely, definite need of hands    Standing Unsupported with Eyes Closed Able to stand 10 seconds with supervision    Standing Unsupported with Feet Together Able to place feet together  independently and stand for 1 minute with supervision    From Standing, Reach Forward with Outstretched Arm Can reach  forward >5 cm safely (2")   4"   From Standing Position, Pick up Object from [place] to pick up shoe, needs supervision    From Standing Position, Turn to Look Behind Over each Shoulder Looks behind one side only/other side shows less weight shift    Turn 360 Degrees Able to turn 360 degrees safely but slowly    Standing Unsupported, Alternately Place Feet on Step/Stool Needs assistance to keep from falling or unable to try    Standing Unsupported, One Foot in [place] to take small step independently and hold 30 seconds    Standing on One Leg Tries to lift leg/unable to hold 3 seconds but remains standing independently    Total Score 35    Berg comment: 35/56      Timed Up and Go Test   TUG Normal TUG    Normal TUG (seconds) 36.72   with use of RW                       Objective measurements completed on examination: See above findings.                PT Education - 11/30/21 1017     Education Details Educated on [employer]; Use of AD for safety due to fall risk.    Person(s) Educated Patient;Spouse    Methods Explanation    Comprehension Verbalized understanding              PT Short Term Goals - 11/30/21 1232       PT SHORT TERM GOAL #1   Title Patient will be indepenent with initial HEP for strength/balance (ALL STGs Due: 12/29/21)    Baseline No HEP established    Time 4    Period Weeks    Status New    Target Date 12/29/21      PT SHORT TERM GOAL #2   Title Patient will improve Berg Balance to >/= 40/56 to demo improved balance and reduced fall risk    Baseline 35/56    Time 4    Period Weeks    Status New      PT SHORT TERM GOAL #3   Title Patient will improve 5x sit <> stand to </= 18 seconds with UE support    Baseline 21.94 secs with UE support    Time 4    Period Weeks    Status New      PT SHORT  TERM GOAL #4   Title Patient will improve TUG to </= 30 seconds with LRAD to demo reduced fall risk    Baseline 36.72 secs    Time 4    Period Weeks    Status New      PT SHORT TERM GOAL #5   Title Pt will improve gait speed to >/= 1.6 ft/sec to demo improved community mobility.    Baseline 1.21 ft/sec    Time 4    Period Weeks    Status New               PT Long Term Goals - 11/30/21 1243       PT LONG TERM GOAL #1   Title Patient will be indepenent with initial HEP for strength/balance (ALL LTGs Due: 01/26/22)    Baseline no HEP established    Time 8    Period Weeks    Status New    Target Date 01/26/22  PT LONG TERM GOAL #2   Title Patient will improve Berg Balance to >/= 45/56 to demo improved balance and reduced fall risk    Baseline 35/56    Time 8    Period Weeks    Status New      PT LONG TERM GOAL #3   Title Patient will improve 5x sit <> stand to </= 15 seconds with UE support    Baseline 21.94 secs    Time 8    Period Weeks    Status New      PT LONG TERM GOAL #4   Title Patient will improve TUG to </= 20 seconds with LRAD to demo reduced fall risk    Baseline 36.72 secs    Time 8    Period Weeks    Status New      PT LONG TERM GOAL #5   Title Pt will improve gait speed to >/= 2.0 ft/sec to demo improved community mobility.    Baseline 1.21 ft/sec    Time 8    Period Weeks    Status New      Additional Long Term Goals   Additional Long Term Goals Yes      PT LONG TERM GOAL #6   Title Patient will be able to ambulate >/= 500 ft with LRAD on level surfaces with supervision to demo improved household/community mobility    Baseline 75 ft RW CGA    Time 8    Period Weeks    Status New      PT LONG TERM GOAL #7   Title Patient will imrpove FOTO to >/= 65%    Baseline 48%    Time 8    Period Weeks    Status New                    Plan - 11/30/21 1221     Clinical Impression Statement Patient is a 61 y.o. female referred  to Neuro OPPT services for CVA. Patient's PMH significant for the following: TB, fatty liver, calcification of aorta, HA, HTN, remote h/o cocaine abuse, ETOH abuse, GERD, Hypothyroidism. Patient presents with the following impairments: decreased strength, impaired balance, abnormal gait, decreased activity tolernace and functional mobility, pain, and fall risk. With 5x sit <> stand, TUG and Berg Balance scores place patient at High Fall Risk. Patient is currently ambulating at 1.21 ft/sec with RW indicating household ambulator. Patient will benefit from skilled PT services to address impairments, and reduce fall risk.    Personal Factors and Comorbidities Comorbidity 3+;Transportation    Comorbidities TB, fatty liver, calcification of aorta, HA, HTN, remote h/o cocaine abuse, ETOH abuse, GERD, Hypothyroidism    Examination-Activity Limitations Transfers;Locomotion Level;Stairs;Stand    Examination-Participation Restrictions Cleaning;Community Activity;Driving    Stability/Clinical Decision Making Evolving/Moderate complexity    Clinical Decision Making Moderate    Rehab Potential Good    PT Frequency 2x / week    PT Duration 8 weeks    PT Treatment/Interventions ADLs/Self Care Home Management;Aquatic Therapy;Cryotherapy;Moist Heat;DME Instruction;Gait training;Stair training;Functional mobility training;Therapeutic activities;Neuromuscular re-education;Balance training;Therapeutic exercise;Patient/family education;Orthotic Fit/Training;Manual techniques;Passive range of motion    PT Next Visit Plan Trial gait withCane due to using this AD at home; initiate HEP focused on strength/balance.    Recommended Other Services Potential Speech Therapy?    Consulted and Agree with Plan of Care Patient             Patient will benefit from skilled therapeutic intervention in order  to improve the following deficits and impairments:  Abnormal gait, Decreased balance, Decreased endurance, Difficulty walking,  Pain, Decreased strength, Decreased knowledge of use of DME, Decreased activity tolerance  Visit Diagnosis: Other abnormalities of gait and mobility  Difficulty in walking, not elsewhere classified  Unsteadiness on feet  Muscle weakness (generalized)     Problem List Patient Active Problem List   Diagnosis Date Noted   Stroke ([place]) 11/09/2021   Essential hypertension 11/09/2021   Noncompliance with medication regimen 11/09/2021   Cerebral brain hemorrhage ([place]) 11/09/2021   Gouty arthritis 10/26/2020   Need for hepatitis B vaccination 01/12/2016   Calcification of aorta ([facility])    Recurrent Clostridium difficile diarrhea 11/18/2015   Macrocytic anemia 11/18/2015   Nausea and vomiting 11/18/2015   Leucocytosis 11/17/2015   Protein-calorie malnutrition, severe 11/17/2015   Hypomagnesemia 09/08/2015   Hypokalemia 09/07/2015   Tobacco use disorder 09/07/2015   Cocaine abuse ([place]) 05/27/2015   Anemia 01/29/2013   Nausea vomiting and diarrhea 01/26/2013   Esophagitis 01/26/2013   Anxiety 03/01/2008   Alcohol abuse 03/01/2008   Elevated LFTs 03/01/2008    Jones Bales, PT, DPT 11/30/2021, 12:48 PM  [facility] [address] [place], [place], [address] Phone: [phone]   Fax:  [phone]  Name: NELLY SCRIVEN MRN: [id number] Date of Birth: [birth year]

## 2021-12-05 ENCOUNTER — Other Ambulatory Visit: Payer: Self-pay

## 2021-12-05 ENCOUNTER — Ambulatory Visit: Payer: 59 | Admitting: Physical Therapy

## 2021-12-05 ENCOUNTER — Encounter: Payer: Self-pay | Admitting: Physical Therapy

## 2021-12-05 DIAGNOSIS — R262 Difficulty in walking, not elsewhere classified: Secondary | ICD-10-CM

## 2021-12-05 DIAGNOSIS — R2689 Other abnormalities of gait and mobility: Secondary | ICD-10-CM | POA: Diagnosis not present

## 2021-12-05 DIAGNOSIS — I639 Cerebral infarction, unspecified: Secondary | ICD-10-CM | POA: Diagnosis not present

## 2021-12-05 DIAGNOSIS — R2681 Unsteadiness on feet: Secondary | ICD-10-CM | POA: Diagnosis not present

## 2021-12-05 DIAGNOSIS — M6281 Muscle weakness (generalized): Secondary | ICD-10-CM

## 2021-12-05 NOTE — Patient Instructions (Signed)
Access Code: [email or web address] URL: [email or web address] Date: 12/05/2021 Prepared by: Willow Ora  Exercises Seated Knee Extension with Resistance - 1 x daily - 5 x weekly - 1 sets - 10 reps Seated Hip Abduction with Resistance - 1 x daily - 5 x weekly - 1 sets - 10 reps Seated March with Resistance - 1 x daily - 5 x weekly - 1 sets - 10 reps Heel Toe Raises with Counter Support - 1 x daily - 5 x weekly - 1 sets - 10 reps Standing Hip Abduction with Counter Support - 1 x daily - 5 x weekly - 1 sets - 10 reps Standing Tandem Balance with Counter Support - 1 x daily - 5 x weekly - 1 sets - 3 reps - 15 seconds hold Standing Balance with Eyes Closed - 1 x daily - 5 x weekly - 1 sets - 3 reps - 30 hold

## 2021-12-05 NOTE — Therapy (Signed)
[facility] [address] [place], [place], [address] Phone: [phone]   Fax:  [phone]  Physical Therapy Treatment  Patient Details  Name: Holly Phelps MRN: [id number] Date of Birth: 1961-10-10 Referring Provider (PT): Aline August, MD   Encounter Date: 12/05/2021   PT End of Session - 12/05/21 1022     Visit Number 2    Number of Visits 17    Date for PT Re-Evaluation 01/26/22    Authorization Type Aetna (VL: 35)    PT Start Time 1018    PT Stop Time 1100    PT Time Calculation (min) 42 min    Activity Tolerance Patient tolerated treatment well    Behavior During Therapy WFL for tasks assessed/performed             Past Medical History:  Diagnosis Date   Allergy    Anemia    Calcification of aorta ([place])    Fatty liver    GERD (gastroesophageal reflux disease)    Headache    Hypertension    Hypothyroidism    Tuberculosis    as baby    Past Surgical History:  Procedure Laterality Date   BACK SURGERY     x 2   COLONOSCOPY N/A 03/13/2013   Procedure: COLONOSCOPY;  Surgeon: Leighton Ruff, MD;  Location: [facility];  Service: Endoscopy;  Laterality: N/A;   LAPAROSCOPIC APPENDECTOMY      There were no vitals filed for this visit.   Subjective Assessment - 12/05/21 1021     Subjective No new complaints. No falls. Some general "aches", no pain.    Pertinent History TB, fatty liver, calcification of aorta, HA, HTN, remote h/o cocaine abuse, ETOH abuse, GERD, Hypothyroidism    Limitations Standing;House hold activities;Walking    Diagnostic tests MRI of the brain showed Extensive atrophy, Extensive chronic microvascular ischemia, Multiple areas of chronic microhemorrhage in the brain including the pons, 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    Currently in Pain? No/denies                   [facility]  Adult PT Treatment/Exercise - 12/05/21 1023       Transfers   Transfers Sit to Stand;Stand to Sit    Sit to Stand 4: Min guard;With upper extremity assist;From bed;From chair/3-in-1    Stand to Sit 4: Min guard;With upper extremity assist;To bed;To chair/3-in-1      Ambulation/Gait   Ambulation/Gait Yes    Ambulation/Gait Assistance 5: Supervision;4: Min guard    Ambulation/Gait Assistance Details cues on posture and to stay witin walker    Ambulation Distance (Feet) --   around clinic with session   Assistive device Rolling walker    Gait Pattern Step-through pattern;Decreased step length - right;Decreased step length - left    Ambulation Surface Level;Indoor      Exercises   Exercises Other Exercises    Other Exercises  issued HEP for strengthening and balance. Refer to [place] for full details. cues on form and technique. No issues reported with performance in session.            Issued to HEP this session: Access Code: [email or web address] URL: [email or web address] Date: 12/05/2021 Prepared by: Willow Ora  Exercises Seated Knee Extension with Resistance - 1 x daily - 5 x weekly - 1 sets - 10 reps Seated Hip Abduction with Resistance - 1 x daily - 5 x weekly -  1 sets - 10 reps Seated March with Resistance - 1 x daily - 5 x weekly - 1 sets - 10 reps Heel Toe Raises with Counter Support - 1 x daily - 5 x weekly - 1 sets - 10 reps Standing Hip Abduction with Counter Support - 1 x daily - 5 x weekly - 1 sets - 10 reps Standing Tandem Balance with Counter Support - 1 x daily - 5 x weekly - 1 sets - 3 reps - 15 seconds hold Standing Balance with Eyes Closed - 1 x daily - 5 x weekly - 1 sets - 3 reps - 30 hold        PT Education - 12/05/21 2058     Education Details issued HEP for strengthening and balance    Person(s) Educated Patient    Methods Explanation;Demonstration;Verbal cues;Handout    Comprehension Verbalized understanding;Returned demonstration;Verbal  cues required;Need further instruction              PT Short Term Goals - 11/30/21 1232       PT SHORT TERM GOAL #1   Title Patient will be indepenent with initial HEP for strength/balance (ALL STGs Due: 12/29/21)    Baseline No HEP established    Time 4    Period Weeks    Status New    Target Date 12/29/21      PT SHORT TERM GOAL #2   Title Patient will improve Berg Balance to >/= 40/56 to demo improved balance and reduced fall risk    Baseline 35/56    Time 4    Period Weeks    Status New      PT SHORT TERM GOAL #3   Title Patient will improve 5x sit <> stand to </= 18 seconds with UE support    Baseline 21.94 secs with UE support    Time 4    Period Weeks    Status New      PT SHORT TERM GOAL #4   Title Patient will improve TUG to </= 30 seconds with LRAD to demo reduced fall risk    Baseline 36.72 secs    Time 4    Period Weeks    Status New      PT SHORT TERM GOAL #5   Title Pt will improve gait speed to >/= 1.6 ft/sec to demo improved community mobility.    Baseline 1.21 ft/sec    Time 4    Period Weeks    Status New               PT Long Term Goals - 11/30/21 1243       PT LONG TERM GOAL #1   Title Patient will be indepenent with initial HEP for strength/balance (ALL LTGs Due: 01/26/22)    Baseline no HEP established    Time 8    Period Weeks    Status New    Target Date 01/26/22      PT LONG TERM GOAL #2   Title Patient will improve Berg Balance to >/= 45/56 to demo improved balance and reduced fall risk    Baseline 35/56    Time 8    Period Weeks    Status New      PT LONG TERM GOAL #3   Title Patient will improve 5x sit <> stand to </= 15 seconds with UE support    Baseline 21.94 secs    Time 8    Period Weeks  Status New      PT LONG TERM GOAL #4   Title Patient will improve TUG to </= 20 seconds with LRAD to demo reduced fall risk    Baseline 36.72 secs    Time 8    Period Weeks    Status New      PT LONG TERM GOAL #5    Title Pt will improve gait speed to >/= 2.0 ft/sec to demo improved community mobility.    Baseline 1.21 ft/sec    Time 8    Period Weeks    Status New      Additional Long Term Goals   Additional Long Term Goals Yes      PT LONG TERM GOAL #6   Title Patient will be able to ambulate >/= 500 ft with LRAD on level surfaces with supervision to demo improved household/community mobility    Baseline 75 ft RW CGA    Time 8    Period Weeks    Status New      PT LONG TERM GOAL #7   Title Patient will imrpove FOTO to >/= 65%    Baseline 48%    Time 8    Period Weeks    Status New                   Plan - 12/05/21 1022     Clinical Impression Statement Todays skilled session focused on establishement of and HEP for strengthening and balance. No issues reported or noted in session. The pt is making progress toward goals and should benefit from continued PT to progress toward unmet goals.    Personal Factors and Comorbidities Comorbidity 3+;Transportation    Comorbidities TB, fatty liver, calcification of aorta, HA, HTN, remote h/o cocaine abuse, ETOH abuse, GERD, Hypothyroidism    Examination-Activity Limitations Transfers;Locomotion Level;Stairs;Stand    Examination-Participation Restrictions Cleaning;Community Activity;Driving    Stability/Clinical Decision Making Evolving/Moderate complexity    Rehab Potential Good    PT Frequency 2x / week    PT Duration 8 weeks    PT Treatment/Interventions ADLs/Self Care Home Management;Aquatic Therapy;Cryotherapy;Moist Heat;DME Instruction;Gait training;Stair training;Functional mobility training;Therapeutic activities;Neuromuscular re-education;Balance training;Therapeutic exercise;Patient/family education;Orthotic Fit/Training;Manual techniques;Passive range of motion    PT Next Visit Plan Trial gait withCane due to using this AD at home; continue to work on LE strengthening and balance training    PT Home Exercise Plan Access Code:  [email or web address]    Consulted and Agree with Plan of Care Patient             Patient will benefit from skilled therapeutic intervention in order to improve the following deficits and impairments:  Abnormal gait, Decreased balance, Decreased endurance, Difficulty walking, Pain, Decreased strength, Decreased knowledge of use of DME, Decreased activity tolerance  Visit Diagnosis: Other abnormalities of gait and mobility  Difficulty in walking, not elsewhere classified  Unsteadiness on feet  Muscle weakness (generalized)     Problem List Patient Active Problem List   Diagnosis Date Noted   Stroke ([place]) 11/09/2021   Essential hypertension 11/09/2021   Noncompliance with medication regimen 11/09/2021   Cerebral brain hemorrhage ([place]) 11/09/2021   Gouty arthritis 10/26/2020   Need for hepatitis B vaccination 01/12/2016   Calcification of aorta ([facility])    Recurrent Clostridium difficile diarrhea 11/18/2015   Macrocytic anemia 11/18/2015   Nausea and vomiting 11/18/2015   Leucocytosis 11/17/2015   Protein-calorie malnutrition, severe 11/17/2015   Hypomagnesemia 09/08/2015   Hypokalemia 09/07/2015   Tobacco use disorder 09/07/2015  Cocaine abuse ([place]) 05/27/2015   Anemia 01/29/2013   Nausea vomiting and diarrhea 01/26/2013   Esophagitis 01/26/2013   Anxiety 03/01/2008   Alcohol abuse 03/01/2008   Elevated LFTs 03/01/2008   Willow Ora, PTA, [facility] [address], [place],  [address] [phone] 12/05/21, 9:03 PM   Name: CHERICE GLENNIE MRN: [id number] Date of Birth: 02/18/1961

## 2021-12-07 ENCOUNTER — Ambulatory Visit: Payer: 59 | Admitting: Physical Therapy

## 2021-12-07 ENCOUNTER — Encounter: Payer: Self-pay | Admitting: Physical Therapy

## 2021-12-07 ENCOUNTER — Other Ambulatory Visit: Payer: Self-pay

## 2021-12-07 DIAGNOSIS — M6281 Muscle weakness (generalized): Secondary | ICD-10-CM | POA: Diagnosis not present

## 2021-12-07 DIAGNOSIS — R2681 Unsteadiness on feet: Secondary | ICD-10-CM

## 2021-12-07 DIAGNOSIS — R2689 Other abnormalities of gait and mobility: Secondary | ICD-10-CM | POA: Diagnosis not present

## 2021-12-07 DIAGNOSIS — R262 Difficulty in walking, not elsewhere classified: Secondary | ICD-10-CM | POA: Diagnosis not present

## 2021-12-07 DIAGNOSIS — I639 Cerebral infarction, unspecified: Secondary | ICD-10-CM | POA: Diagnosis not present

## 2021-12-08 ENCOUNTER — Ambulatory Visit: Payer: 59 | Attending: Internal Medicine

## 2021-12-08 ENCOUNTER — Other Ambulatory Visit (HOSPITAL_BASED_OUTPATIENT_CLINIC_OR_DEPARTMENT_OTHER): Payer: Self-pay

## 2021-12-08 DIAGNOSIS — Z23 Encounter for immunization: Secondary | ICD-10-CM

## 2021-12-08 MED ORDER — PFIZER COVID-19 VAC BIVALENT 30 MCG/0.3ML IM SUSP
INTRAMUSCULAR | 0 refills | Status: AC
  Filled 2021-12-08: qty 0.3, 1d supply, fill #0

## 2021-12-08 NOTE — Therapy (Signed)
[facility] [address] [place], [place], [address] Phone: [phone]   Fax:  [phone]  Physical Therapy Treatment  Patient Details  Name: Holly Phelps MRN: [id number] Date of Birth: Sep 28, 1961 Referring Provider (PT): Aline August, MD   Encounter Date: 12/07/2021   PT End of Session - 12/07/21 1323     Visit Number 3    Number of Visits 17    Date for PT Re-Evaluation 01/26/22    Authorization Type Aetna (VL: 69)    PT Start Time 1319    PT Stop Time 1400    PT Time Calculation (min) 41 min    Activity Tolerance Patient tolerated treatment well    Behavior During Therapy WFL for tasks assessed/performed             Past Medical History:  Diagnosis Date   Allergy    Anemia    Calcification of aorta ([place])    Fatty liver    GERD (gastroesophageal reflux disease)    Headache    Hypertension    Hypothyroidism    Tuberculosis    as baby    Past Surgical History:  Procedure Laterality Date   BACK SURGERY     x 2   COLONOSCOPY N/A 03/13/2013   Procedure: COLONOSCOPY;  Surgeon: Leighton Ruff, MD;  Location: [facility];  Service: Endoscopy;  Laterality: N/A;   LAPAROSCOPIC APPENDECTOMY      There were no vitals filed for this visit.   Subjective Assessment - 12/07/21 1322     Subjective No new complaints. No falls. Some general "aches", no pain. Felt okay after last session. Was able to do the ex's at home with no issues.    Pertinent History TB, fatty liver, calcification of aorta, HA, HTN, remote h/o cocaine abuse, ETOH abuse, GERD, Hypothyroidism    Limitations Standing;House hold activities;Walking    Diagnostic tests MRI of the brain showed Extensive atrophy, Extensive chronic microvascular ischemia, Multiple areas of chronic microhemorrhage in the brain including the pons, 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    Patient Stated Goals Improve Walking; Get Back to Cooking    Currently in Pain? No/denies                    [facility] Adult PT Treatment/Exercise - 12/07/21 1323       Transfers   Transfers Sit to Stand;Stand to Sit    Sit to Stand 5: Supervision;With upper extremity assist;From bed;From chair/3-in-1    Stand to Sit 5: Supervision;With upper extremity assist;To bed;To chair/3-in-1      Ambulation/Gait   Ambulation/Gait Yes    Ambulation/Gait Assistance 5: Supervision;4: Min guard    Ambulation/Gait Assistance Details supervision with RW to enter/exit session. pt has small base quad cane at home. tused in session for short distance with pt noted ot only place 2 of 4 feet on floor with gait despite cues. switched to straight cane with rubber quad tip- mod/max cues for proper sequencing with min guard assist for balance, hand over hand assistance on cane with cues to increase bil step/sride length, with larger steps sequencing improved then looked at gait with no device for short distances    Ambulation Distance (Feet) 30 Feet   x1 small based quad cane; 115 x1 with straight cane with rubber quad tip; 50 feet no device   Assistive device Rolling walker;Straight cane;Small based quad cane;None   cane  with rubber quad tip   Gait Pattern Step-through pattern;Decreased step length - right;Decreased step length - left    Ambulation Surface Level;Indoor      Self-Care   Self-Care Other Self-Care Comments    Other Self-Care Comments  discussed fall prevention in home as pt reports wearing only socks to walk on hardwoods, with or without a device.      Knee/Hip Exercises: Aerobic   Other Aerobic Scifit UE/LE's level 3.5 x 6 minutes with goal >/= 30 steps per minute for strengthening and activity tolerance.      Knee/Hip Exercises: Standing   Forward Step Up Both;1 set;10 reps;Hand Hold: 2;Step Height: 6";Limitations    Forward Step Up Limitations with bil UE support on  rails, contralateral march on non stance leg for 10 reps on each side.                 Balance Exercises - 12/07/21 1350       Balance Exercises: Standing   Standing Eyes Opened Wide (BOA);Head turns;Foam/compliant surface;Other reps (comment);Limitations    Standing Eyes Opened Limitations on airex no UE support with feet wide apart- for head movements left<>right, up<>down with min guard to min assist for balance.    Standing Eyes Closed Foam/compliant surface;3 reps;30 secs;Limitations    Standing Eyes Closed Limitations on airex no UE support with feet wide apart with up to min assist for balance.                  PT Short Term Goals - 11/30/21 1232       PT SHORT TERM GOAL #1   Title Patient will be indepenent with initial HEP for strength/balance (ALL STGs Due: 12/29/21)    Baseline No HEP established    Time 4    Period Weeks    Status New    Target Date 12/29/21      PT SHORT TERM GOAL #2   Title Patient will improve Berg Balance to >/= 40/56 to demo improved balance and reduced fall risk    Baseline 35/56    Time 4    Period Weeks    Status New      PT SHORT TERM GOAL #3   Title Patient will improve 5x sit <> stand to </= 18 seconds with UE support    Baseline 21.94 secs with UE support    Time 4    Period Weeks    Status New      PT SHORT TERM GOAL #4   Title Patient will improve TUG to </= 30 seconds with LRAD to demo reduced fall risk    Baseline 36.72 secs    Time 4    Period Weeks    Status New      PT SHORT TERM GOAL #5   Title Pt will improve gait speed to >/= 1.6 ft/sec to demo improved community mobility.    Baseline 1.21 ft/sec    Time 4    Period Weeks    Status New               PT Long Term Goals - 11/30/21 1243       PT LONG TERM GOAL #1   Title Patient will be indepenent with initial HEP for strength/balance (ALL LTGs Due: 01/26/22)    Baseline no HEP established    Time 8    Period Weeks    Status New     Target Date 01/26/22  PT LONG TERM GOAL #2   Title Patient will improve Berg Balance to >/= 45/56 to demo improved balance and reduced fall risk    Baseline 35/56    Time 8    Period Weeks    Status New      PT LONG TERM GOAL #3   Title Patient will improve 5x sit <> stand to </= 15 seconds with UE support    Baseline 21.94 secs    Time 8    Period Weeks    Status New      PT LONG TERM GOAL #4   Title Patient will improve TUG to </= 20 seconds with LRAD to demo reduced fall risk    Baseline 36.72 secs    Time 8    Period Weeks    Status New      PT LONG TERM GOAL #5   Title Pt will improve gait speed to >/= 2.0 ft/sec to demo improved community mobility.    Baseline 1.21 ft/sec    Time 8    Period Weeks    Status New      Additional Long Term Goals   Additional Long Term Goals Yes      PT LONG TERM GOAL #6   Title Patient will be able to ambulate >/= 500 ft with LRAD on level surfaces with supervision to demo improved household/community mobility    Baseline 75 ft RW CGA    Time 8    Period Weeks    Status New      PT LONG TERM GOAL #7   Title Patient will imrpove FOTO to >/= 65%    Baseline 48%    Time 8    Period Weeks    Status New                   Plan - 12/07/21 1323     Clinical Impression Statement Today's skilled session addressed gait with various devices as pt reports  using cane vs no AD in home. Pt had a small base quad cane that she does not use correctly, mostly carries vs one foot on floor. With straight cane this improved with max downgrading to min/mod cues on correct sequencing. With no AD pt with mild postural sway, no significant balance loss. Will need additonal training and practice before safe with no AD or straight cane. Pt advised to use a device at this time due to deficits for safety. Also discussed wearing gripper socks vs house shoes as pt walks on hardwoods in socks only. Discussed the increased risk for slipping/falling  with just socks on. Remainder of session continued to address strengthening and balance training with no issues noted or reported. The pt is making progress toward goals and should benefit from continued PT to progress toward unmet goals.    Personal Factors and Comorbidities Comorbidity 3+;Transportation    Comorbidities TB, fatty liver, calcification of aorta, HA, HTN, remote h/o cocaine abuse, ETOH abuse, GERD, Hypothyroidism    Examination-Activity Limitations Transfers;Locomotion Level;Stairs;Stand    Examination-Participation Restrictions Cleaning;Community Activity;Driving    Stability/Clinical Decision Making Evolving/Moderate complexity    Rehab Potential Good    PT Frequency 2x / week    PT Duration 8 weeks    PT Treatment/Interventions ADLs/Self Care Home Management;Aquatic Therapy;Cryotherapy;Moist Heat;DME Instruction;Gait training;Stair training;Functional mobility training;Therapeutic activities;Neuromuscular re-education;Balance training;Therapeutic exercise;Patient/family education;Orthotic Fit/Training;Manual techniques;Passive range of motion    PT Next Visit Plan continue to work on gait with straight cane with quad  tip vs no AD; continue to work on LE strengthening and balance training    PT Home Exercise Plan Access Code: [place] and Agree with Plan of Care Patient             Patient will benefit from skilled therapeutic intervention in order to improve the following deficits and impairments:  Abnormal gait, Decreased balance, Decreased endurance, Difficulty walking, Pain, Decreased strength, Decreased knowledge of use of DME, Decreased activity tolerance  Visit Diagnosis: Other abnormalities of gait and mobility  Difficulty in walking, not elsewhere classified  Unsteadiness on feet  Muscle weakness (generalized)     Problem List Patient Active Problem List   Diagnosis Date Noted   Stroke ([place]) 11/09/2021   Essential hypertension 11/09/2021    Noncompliance with medication regimen 11/09/2021   Cerebral brain hemorrhage ([place]) 11/09/2021   Gouty arthritis 10/26/2020   Need for hepatitis B vaccination 01/12/2016   Calcification of aorta ([facility])    Recurrent Clostridium difficile diarrhea 11/18/2015   Macrocytic anemia 11/18/2015   Nausea and vomiting 11/18/2015   Leucocytosis 11/17/2015   Protein-calorie malnutrition, severe 11/17/2015   Hypomagnesemia 09/08/2015   Hypokalemia 09/07/2015   Tobacco use disorder 09/07/2015   Cocaine abuse ([place]) 05/27/2015   Anemia 01/29/2013   Nausea vomiting and diarrhea 01/26/2013   Esophagitis 01/26/2013   Anxiety 03/01/2008   Alcohol abuse 03/01/2008   Elevated LFTs 03/01/2008    Willow Ora, PTA, [facility] [address] [place], [place] [address] [phone] 12/08/21, 8:12 AM   Name: Holly Phelps MRN: [id number] Date of Birth: 25-Apr-1961

## 2021-12-13 ENCOUNTER — Other Ambulatory Visit: Payer: Self-pay

## 2021-12-13 ENCOUNTER — Ambulatory Visit: Payer: 59

## 2021-12-13 DIAGNOSIS — M6281 Muscle weakness (generalized): Secondary | ICD-10-CM

## 2021-12-13 DIAGNOSIS — R2681 Unsteadiness on feet: Secondary | ICD-10-CM | POA: Diagnosis not present

## 2021-12-13 DIAGNOSIS — R262 Difficulty in walking, not elsewhere classified: Secondary | ICD-10-CM | POA: Diagnosis not present

## 2021-12-13 DIAGNOSIS — R2689 Other abnormalities of gait and mobility: Secondary | ICD-10-CM

## 2021-12-13 DIAGNOSIS — I639 Cerebral infarction, unspecified: Secondary | ICD-10-CM | POA: Diagnosis not present

## 2021-12-13 NOTE — Therapy (Signed)
[facility] [address] [place], [place], [address] Phone: [phone]   Fax:  [phone]  Physical Therapy Treatment  Patient Details  Name: Holly Phelps MRN: [id number] Date of Birth: Mar 28, 1961 Referring Provider (PT): Aline August, MD   Encounter Date: 12/13/2021   PT End of Session - 12/13/21 1020     Visit Number 4    Number of Visits 17    Date for PT Re-Evaluation 01/26/22    Authorization Type Aetna (VL: 19)    PT Start Time 1016    PT Stop Time 1058    PT Time Calculation (min) 42 min    Activity Tolerance Patient tolerated treatment well    Behavior During Therapy WFL for tasks assessed/performed             Past Medical History:  Diagnosis Date   Allergy    Anemia    Calcification of aorta ([place])    Fatty liver    GERD (gastroesophageal reflux disease)    Headache    Hypertension    Hypothyroidism    Tuberculosis    as baby    Past Surgical History:  Procedure Laterality Date   BACK SURGERY     x 2   COLONOSCOPY N/A 03/13/2013   Procedure: COLONOSCOPY;  Surgeon: Leighton Ruff, MD;  Location: [facility];  Service: Endoscopy;  Laterality: N/A;   LAPAROSCOPIC APPENDECTOMY      There were no vitals filed for this visit.   Subjective Assessment - 12/13/21 1020     Subjective Patient reports some general acheness due to the weather, no pain. Reports no new changes/complaints. Reports has been doing the exercises, only missed one day.    Pertinent History TB, fatty liver, calcification of aorta, HA, HTN, remote h/o cocaine abuse, ETOH abuse, GERD, Hypothyroidism    Limitations Standing;House hold activities;Walking    Diagnostic tests MRI of the brain showed Extensive atrophy, Extensive chronic microvascular ischemia, Multiple areas of chronic microhemorrhage in the brain including the pons, 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    Patient Stated Goals Improve Walking; Get Back to Cooking    Currently in Pain? No/denies               [facility] Adult PT Treatment/Exercise - 12/13/21 0001       Transfers   Transfers Sit to Stand;Stand to Sit    Sit to Stand 5: Supervision;With upper extremity assist;From bed;From chair/3-in-1    Stand to Sit 5: Supervision;With upper extremity assist;To bed;To chair/3-in-1    Number of Reps 10 reps;1 set    Comments completed sit <> stand training from mat with single UE support pushing from mat.      Ambulation/Gait   Ambulation/Gait Yes    Ambulation/Gait Assistance 5: Supervision;4: Min guard    Ambulation/Gait Assistance Details Completed gait training with SPC with quad tip x  115 ft, patient having increasing challenge with sequencing requiring mod/max cues/demo from PT and hand over hand sequencing. Then trialed ambulation without AD 1 x 115, 1 x 230 ft, with intermittent rest breaks due to fatigue. Improved step length and gait speed noted without cane, but do demo some mild imbalance.    Ambulation Distance (Feet) 115 Feet   x 2, 230 x 1   Assistive device Rolling walker;Straight cane;None    Gait Pattern Step-through pattern;Decreased step length - right;Decreased step length - left    Ambulation  Surface Level;Indoor      Exercises   Exercises Knee/Hip      Knee/Hip Exercises: Aerobic   Other Aerobic Scifit UE/LE's level 3.5 x 6 minutes with goal >/= 40 steps per minute for strengthening and activity tolerance. Pt tolerating increased pace well.              Balance Exercises - 12/13/21 0001       Balance Exercises: Standing   Standing Eyes Opened Wide (BOA);Head turns;Foam/compliant surface;Limitations    Standing Eyes Opened Limitations on airex no UE support with feet wide apart, completed head turns/nods x 10 reps each direction, cues to avoid holding on with UE support. CGA.    Standing Eyes Closed Foam/compliant  surface;3 reps;30 secs;Limitations    Standing Eyes Closed Limitations on airex no UE support with feet wide apart, CGA to MinA for balance as needed, able to hold eyes closed with intermittent cues to avoid touching rails. Marland Kitchen    SLS with Vectors Solid surface;Intermittent upper extremity assist;Limitations    SLS with Vectors Limitations standing on firm surface completed alteranting toe taps to 1st step progressing from single UE support x 10 reps, then trialed x 5 reps without UE support, intermittent CGA required. Patient fearful to complete without UE support.                  PT Short Term Goals - 11/30/21 1232       PT SHORT TERM GOAL #1   Title Patient will be indepenent with initial HEP for strength/balance (ALL STGs Due: 12/29/21)    Baseline No HEP established    Time 4    Period Weeks    Status New    Target Date 12/29/21      PT SHORT TERM GOAL #2   Title Patient will improve Berg Balance to >/= 40/56 to demo improved balance and reduced fall risk    Baseline 35/56    Time 4    Period Weeks    Status New      PT SHORT TERM GOAL #3   Title Patient will improve 5x sit <> stand to </= 18 seconds with UE support    Baseline 21.94 secs with UE support    Time 4    Period Weeks    Status New      PT SHORT TERM GOAL #4   Title Patient will improve TUG to </= 30 seconds with LRAD to demo reduced fall risk    Baseline 36.72 secs    Time 4    Period Weeks    Status New      PT SHORT TERM GOAL #5   Title Pt will improve gait speed to >/= 1.6 ft/sec to demo improved community mobility.    Baseline 1.21 ft/sec    Time 4    Period Weeks    Status New               PT Long Term Goals - 11/30/21 1243       PT LONG TERM GOAL #1   Title Patient will be indepenent with initial HEP for strength/balance (ALL LTGs Due: 01/26/22)    Baseline no HEP established    Time 8    Period Weeks    Status New    Target Date 01/26/22      PT LONG TERM GOAL #2   Title  Patient will improve Berg Balance to >/= 45/56 to demo improved balance and reduced fall  risk    Baseline 35/56    Time 8    Period Weeks    Status New      PT LONG TERM GOAL #3   Title Patient will improve 5x sit <> stand to </= 15 seconds with UE support    Baseline 21.94 secs    Time 8    Period Weeks    Status New      PT LONG TERM GOAL #4   Title Patient will improve TUG to </= 20 seconds with LRAD to demo reduced fall risk    Baseline 36.72 secs    Time 8    Period Weeks    Status New      PT LONG TERM GOAL #5   Title Pt will improve gait speed to >/= 2.0 ft/sec to demo improved community mobility.    Baseline 1.21 ft/sec    Time 8    Period Weeks    Status New      Additional Long Term Goals   Additional Long Term Goals Yes      PT LONG TERM GOAL #6   Title Patient will be able to ambulate >/= 500 ft with LRAD on level surfaces with supervision to demo improved household/community mobility    Baseline 75 ft RW CGA    Time 8    Period Weeks    Status New      PT LONG TERM GOAL #7   Title Patient will imrpove FOTO to >/= 65%    Baseline 48%    Time 8    Period Weeks    Status New                   Plan - 12/13/21 1051     Clinical Impression Statement Continued gait training with SPC vs. no AD. Patient demo mild sway, but improved step length and gait speed without AD vs. with device. With [facility], patient requiring hand over hand sequencing and Mod cues to use properly. Rest of session focused on continued strengthening and balance activities. increased challenge with SLS, with patietn fearful to trial withut UE support. Will contiue to progress toward all LTGs.    Personal Factors and Comorbidities Comorbidity 3+;Transportation    Comorbidities TB, fatty liver, calcification of aorta, HA, HTN, remote h/o cocaine abuse, ETOH abuse, GERD, Hypothyroidism    Examination-Activity Limitations Transfers;Locomotion Level;Stairs;Stand     Examination-Participation Restrictions Cleaning;Community Activity;Driving    Stability/Clinical Decision Making Evolving/Moderate complexity    Rehab Potential Good    PT Frequency 2x / week    PT Duration 8 weeks    PT Treatment/Interventions ADLs/Self Care Home Management;Aquatic Therapy;Cryotherapy;Moist Heat;DME Instruction;Gait training;Stair training;Functional mobility training;Therapeutic activities;Neuromuscular re-education;Balance training;Therapeutic exercise;Patient/family education;Orthotic Fit/Training;Manual techniques;Passive range of motion    PT Next Visit Plan continue to work on gait with straight cane with quad tip vs no AD; continue to work on LE strengthening and balance training    PT Home Exercise Plan Access Code: [email or web address]    Consulted and Agree with Plan of Care Patient             Patient will benefit from skilled therapeutic intervention in order to improve the following deficits and impairments:  Abnormal gait, Decreased balance, Decreased endurance, Difficulty walking, Pain, Decreased strength, Decreased knowledge of use of DME, Decreased activity tolerance  Visit Diagnosis: Other abnormalities of gait and mobility  Difficulty in walking, not elsewhere classified  Unsteadiness on feet  Muscle weakness (generalized)  Problem List Patient Active Problem List   Diagnosis Date Noted   Stroke [facility]) 11/09/2021   Essential hypertension 11/09/2021   Noncompliance with medication regimen 11/09/2021   Cerebral brain hemorrhage ([place]) 11/09/2021   Gouty arthritis 10/26/2020   Need for hepatitis B vaccination 01/12/2016   Calcification of aorta ([facility])    Recurrent Clostridium difficile diarrhea 11/18/2015   Macrocytic anemia 11/18/2015   Nausea and vomiting 11/18/2015   Leucocytosis 11/17/2015   Protein-calorie malnutrition, severe 11/17/2015   Hypomagnesemia 09/08/2015   Hypokalemia 09/07/2015   Tobacco use disorder 09/07/2015   Cocaine abuse  ([place]) 05/27/2015   Anemia 01/29/2013   Nausea vomiting and diarrhea 01/26/2013   Esophagitis 01/26/2013   Anxiety 03/01/2008   Alcohol abuse 03/01/2008   Elevated LFTs 03/01/2008    Jones Bales, PT, DPT 12/13/2021, 11:00 AM  [facility] [address] [place], [place], [address] Phone: [phone]   Fax:  [phone]  Name: Holly Phelps MRN: [id number] Date of Birth: 1961/09/09

## 2021-12-18 ENCOUNTER — Other Ambulatory Visit (HOSPITAL_BASED_OUTPATIENT_CLINIC_OR_DEPARTMENT_OTHER): Payer: Self-pay

## 2021-12-18 NOTE — Progress Notes (Signed)
   Covid-19 Vaccination Clinic  Name:  Holly Phelps    MRN: [id number] DOB: Aug 14, 1961  12/18/2021  Holly Phelps was observed post Covid-19 immunization for 15 minutes without incident. She was provided with Vaccine Information Sheet and instruction to access the V-Safe system.   Holly Phelps was instructed to call 911 with any severe reactions post vaccine: Difficulty breathing  Swelling of face and throat  A fast heartbeat  A bad rash all over body  Dizziness and weakness

## 2021-12-19 ENCOUNTER — Ambulatory Visit: Payer: 59 | Admitting: Physical Therapy

## 2021-12-19 ENCOUNTER — Encounter: Payer: Self-pay | Admitting: Physical Therapy

## 2021-12-19 ENCOUNTER — Other Ambulatory Visit: Payer: Self-pay

## 2021-12-19 DIAGNOSIS — I639 Cerebral infarction, unspecified: Secondary | ICD-10-CM | POA: Diagnosis not present

## 2021-12-19 DIAGNOSIS — M6281 Muscle weakness (generalized): Secondary | ICD-10-CM

## 2021-12-19 DIAGNOSIS — R2681 Unsteadiness on feet: Secondary | ICD-10-CM | POA: Diagnosis not present

## 2021-12-19 DIAGNOSIS — R262 Difficulty in walking, not elsewhere classified: Secondary | ICD-10-CM

## 2021-12-19 DIAGNOSIS — R2689 Other abnormalities of gait and mobility: Secondary | ICD-10-CM | POA: Diagnosis not present

## 2021-12-19 NOTE — Therapy (Signed)
[facility] [address] [place], [place], [address] Phone: [phone]   Fax:  [phone]  Physical Therapy Treatment  Patient Details  Name: Holly Phelps MRN: [id number] Date of Birth: Mar 01, 1961 Referring Provider (PT): Aline August, MD   Encounter Date: 12/19/2021   PT End of Session - 12/19/21 1108     Visit Number 5    Number of Visits 17    Date for PT Re-Evaluation 01/26/22    Authorization Type Aetna (VL: 56)    PT Start Time 1104    PT Stop Time 1145    PT Time Calculation (min) 41 min    Equipment Utilized During Treatment Gait belt    Activity Tolerance Patient tolerated treatment well    Behavior During Therapy WFL for tasks assessed/performed             Past Medical History:  Diagnosis Date   Allergy    Anemia    Calcification of aorta ([place])    Fatty liver    GERD (gastroesophageal reflux disease)    Headache    Hypertension    Hypothyroidism    Tuberculosis    as baby    Past Surgical History:  Procedure Laterality Date   BACK SURGERY     x 2   COLONOSCOPY N/A 03/13/2013   Procedure: COLONOSCOPY;  Surgeon: Leighton Ruff, MD;  Location: [facility];  Service: Endoscopy;  Laterality: N/A;   LAPAROSCOPIC APPENDECTOMY      There were no vitals filed for this visit.   Subjective Assessment - 12/19/21 1107     Subjective No new complaints. No falls. Some achiness from OA and cold/damp weather.    Pertinent History TB, fatty liver, calcification of aorta, HA, HTN, remote h/o cocaine abuse, ETOH abuse, GERD, Hypothyroidism    Limitations Standing;House hold activities;Walking    Diagnostic tests MRI of the brain showed Extensive atrophy, Extensive chronic microvascular ischemia, Multiple areas of chronic microhemorrhage in the brain including the pons, 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     Patient Stated Goals Improve Walking; Get Back to Cooking    Currently in Pain? No/denies                   [facility] Adult PT Treatment/Exercise - 12/19/21 1108       Transfers   Transfers Sit to Stand;Stand to Sit    Sit to Stand 5: Supervision;With upper extremity assist;From bed;From chair/3-in-1    Stand to Sit 5: Supervision;With upper extremity assist;To bed;To chair/3-in-1      Ambulation/Gait   Ambulation/Gait Yes    Ambulation/Gait Assistance 5: Supervision;4: Min guard    Ambulation/Gait Assistance Details no device for gait in session with cues for increased step/stride length and for reciprocal arm swing.    Ambulation Distance (Feet) --   around clinic with session   Assistive device Rolling walker;None    Gait Pattern Step-through pattern;Decreased step length - right;Decreased step length - left    Ambulation Surface Level;Indoor      Knee/Hip Exercises: Aerobic   Other Aerobic Scifit UE/LE's level 3.5 x 8 minutes with goal >/= 45 steps per minute for strengthening and activity tolerance. Pt tolerating increased pace well.                 Balance Exercises - 12/19/21 1113       Balance Exercises: Standing   Balance Beam standing across blue  foam beam with light UE support on bars: alternating foward stepping to floor/back onto beam, then alternating backward stepping to floor/back onto beam for ~10 reps each/each way. cues for light UE support to progress balance challenge and progress decreased UE support. min guard assist for safety.    Tandem Gait Forward;Retro;Upper extremity support;Foam/compliant surface;3 reps;Limitations    Tandem Gait Limitations on blue foam beam with light UE support on bars: light UE support with cues on step placement and  posture. min guard assist for safety.    Sidestepping Foam/compliant support;Upper extremity support;3 reps;Limitations    Sidestepping Limitations on blue foam beam with light UE support on bars: cues on  posture, step length/height and weight shifting for 3 laps each way. min guard assist for safety.    Sit to Stand Standard surface;Foam/compliant surface;Limitations;Upper extremity support    Sit to Stand Limitations seated at edge of mat table with feet on airex: sit<>stands with UE assist on mat to stand up and to control descent with sitting down. min guard to min assist for balance upon standing with no UE support.                  PT Short Term Goals - 11/30/21 1232       PT SHORT TERM GOAL #1   Title Patient will be indepenent with initial HEP for strength/balance (ALL STGs Due: 12/29/21)    Baseline No HEP established    Time 4    Period Weeks    Status New    Target Date 12/29/21      PT SHORT TERM GOAL #2   Title Patient will improve Berg Balance to >/= 40/56 to demo improved balance and reduced fall risk    Baseline 35/56    Time 4    Period Weeks    Status New      PT SHORT TERM GOAL #3   Title Patient will improve 5x sit <> stand to </= 18 seconds with UE support    Baseline 21.94 secs with UE support    Time 4    Period Weeks    Status New      PT SHORT TERM GOAL #4   Title Patient will improve TUG to </= 30 seconds with LRAD to demo reduced fall risk    Baseline 36.72 secs    Time 4    Period Weeks    Status New      PT SHORT TERM GOAL #5   Title Pt will improve gait speed to >/= 1.6 ft/sec to demo improved community mobility.    Baseline 1.21 ft/sec    Time 4    Period Weeks    Status New               PT Long Term Goals - 11/30/21 1243       PT LONG TERM GOAL #1   Title Patient will be indepenent with initial HEP for strength/balance (ALL LTGs Due: 01/26/22)    Baseline no HEP established    Time 8    Period Weeks    Status New    Target Date 01/26/22      PT LONG TERM GOAL #2   Title Patient will improve Berg Balance to >/= 45/56 to demo improved balance and reduced fall risk    Baseline 35/56    Time 8    Period Weeks     Status New      PT LONG TERM GOAL #  3   Title Patient will improve 5x sit <> stand to </= 15 seconds with UE support    Baseline 21.94 secs    Time 8    Period Weeks    Status New      PT LONG TERM GOAL #4   Title Patient will improve TUG to </= 20 seconds with LRAD to demo reduced fall risk    Baseline 36.72 secs    Time 8    Period Weeks    Status New      PT LONG TERM GOAL #5   Title Pt will improve gait speed to >/= 2.0 ft/sec to demo improved community mobility.    Baseline 1.21 ft/sec    Time 8    Period Weeks    Status New      Additional Long Term Goals   Additional Long Term Goals Yes      PT LONG TERM GOAL #6   Title Patient will be able to ambulate >/= 500 ft with LRAD on level surfaces with supervision to demo improved household/community mobility    Baseline 75 ft RW CGA    Time 8    Period Weeks    Status New      PT LONG TERM GOAL #7   Title Patient will imrpove FOTO to >/= 65%    Baseline 48%    Time 8    Period Weeks    Status New                   Plan - 12/19/21 1108     Clinical Impression Statement Today's skilled session continued to focus on strengthening and balance training wtih no issues noted or reported. A few standing rest breakes needed with balance ex's, no others needed. The pt is making steady progress toward goals and should benefit from continued PT to progress toward unmet goals.    Personal Factors and Comorbidities Comorbidity 3+;Transportation    Comorbidities TB, fatty liver, calcification of aorta, HA, HTN, remote h/o cocaine abuse, ETOH abuse, GERD, Hypothyroidism    Examination-Activity Limitations Transfers;Locomotion Level;Stairs;Stand    Examination-Participation Restrictions Cleaning;Community Activity;Driving    Stability/Clinical Decision Making Evolving/Moderate complexity    Rehab Potential Good    PT Frequency 2x / week    PT Duration 8 weeks    PT Treatment/Interventions ADLs/Self Care Home  Management;Aquatic Therapy;Cryotherapy;Moist Heat;DME Instruction;Gait training;Stair training;Functional mobility training;Therapeutic activities;Neuromuscular re-education;Balance training;Therapeutic exercise;Patient/family education;Orthotic Fit/Training;Manual techniques;Passive range of motion    PT Next Visit Plan continue to work on gait with straight cane with quad tip vs no AD; continue to work on LE strengthening and balance training    PT Home Exercise Plan Access Code: [email or web address]    Consulted and Agree with Plan of Care Patient             Patient will benefit from skilled therapeutic intervention in order to improve the following deficits and impairments:  Abnormal gait, Decreased balance, Decreased endurance, Difficulty walking, Pain, Decreased strength, Decreased knowledge of use of DME, Decreased activity tolerance  Visit Diagnosis: Other abnormalities of gait and mobility  Difficulty in walking, not elsewhere classified  Unsteadiness on feet  Muscle weakness (generalized)     Problem List Patient Active Problem List   Diagnosis Date Noted   Stroke ([place]) 11/09/2021   Essential hypertension 11/09/2021   Noncompliance with medication regimen 11/09/2021   Cerebral brain hemorrhage ([place]) 11/09/2021   Gouty arthritis 10/26/2020   Need for hepatitis B vaccination  01/12/2016   Calcification of aorta ([facility])    Recurrent Clostridium difficile diarrhea 11/18/2015   Macrocytic anemia 11/18/2015   Nausea and vomiting 11/18/2015   Leucocytosis 11/17/2015   Protein-calorie malnutrition, severe 11/17/2015   Hypomagnesemia 09/08/2015   Hypokalemia 09/07/2015   Tobacco use disorder 09/07/2015   Cocaine abuse ([place]) 05/27/2015   Anemia 01/29/2013   Nausea vomiting and diarrhea 01/26/2013   Esophagitis 01/26/2013   Anxiety 03/01/2008   Alcohol abuse 03/01/2008   Elevated LFTs 03/01/2008    Willow Ora, PTA, [facility] [address], [place], [place] [address] [phone] 12/19/21, 7:03 PM   Name: NORMAJEAN NASH MRN: [id number] Date of Birth: Feb 21, 1961

## 2021-12-21 ENCOUNTER — Ambulatory Visit: Payer: 59 | Attending: Registered Nurse

## 2021-12-21 ENCOUNTER — Other Ambulatory Visit: Payer: Self-pay

## 2021-12-21 DIAGNOSIS — R2689 Other abnormalities of gait and mobility: Secondary | ICD-10-CM | POA: Diagnosis not present

## 2021-12-21 DIAGNOSIS — M6281 Muscle weakness (generalized): Secondary | ICD-10-CM | POA: Insufficient documentation

## 2021-12-21 DIAGNOSIS — R2681 Unsteadiness on feet: Secondary | ICD-10-CM | POA: Diagnosis not present

## 2021-12-21 DIAGNOSIS — R262 Difficulty in walking, not elsewhere classified: Secondary | ICD-10-CM

## 2021-12-21 NOTE — Therapy (Signed)
[facility] [address] [place], [place], [address] Phone: [phone]   Fax:  [phone]  Physical Therapy Treatment  Patient Details  Name: Holly Phelps MRN: [id number] Date of Birth: [birth year] Referring Provider (PT): Aline August, MD   Encounter Date: 12/21/2021   PT End of Session - 12/21/21 1105     Visit Number 6    Number of Visits 17    Date for PT Re-Evaluation 01/26/22    Authorization Type Aetna (VL: 76)    PT Start Time 1102    PT Stop Time 1143    PT Time Calculation (min) 41 min    Equipment Utilized During Treatment Gait belt    Activity Tolerance Patient tolerated treatment well    Behavior During Therapy WFL for tasks assessed/performed             Past Medical History:  Diagnosis Date   Allergy    Anemia    Calcification of aorta ([place])    Fatty liver    GERD (gastroesophageal reflux disease)    Headache    Hypertension    Hypothyroidism    Tuberculosis    as baby    Past Surgical History:  Procedure Laterality Date   BACK SURGERY     x 2   COLONOSCOPY N/A 03/13/2013   Procedure: COLONOSCOPY;  Surgeon: Leighton Ruff, MD;  Location: [facility];  Service: Endoscopy;  Laterality: N/A;   LAPAROSCOPIC APPENDECTOMY      There were no vitals filed for this visit.   Subjective Assessment - 12/21/21 1105     Subjective Patient reports almost cancelled due to soreness in the legs. Patient reports that she felt like she over did it at last session.    Pertinent History TB, fatty liver, calcification of aorta, HA, HTN, remote h/o cocaine abuse, ETOH abuse, GERD, Hypothyroidism    Limitations Standing;House hold activities;Walking    Diagnostic tests MRI of the brain showed Extensive atrophy, Extensive chronic microvascular ischemia, Multiple areas of chronic microhemorrhage in the brain including the pons, 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    Patient Stated Goals Improve Walking; Get Back to Cooking    Currently in Pain? Yes    Pain Score 6     Pain Location Leg    Pain Orientation Left;Right    Pain Descriptors / Indicators Sore    Pain Type Acute pain    Pain Onset Yesterday    Pain Frequency Intermittent              [facility] Adult PT Treatment/Exercise - 12/21/21 0001       Transfers   Transfers Sit to Stand;Stand to Sit    Sit to Stand 5: Supervision;With upper extremity assist;From bed;From chair/3-in-1    Stand to Sit 5: Supervision;With upper extremity assist;To bed;To chair/3-in-1    Number of Reps 10 reps;1 set    Comments completed sit <> stands with BLE placed on airex, completed 2 x 5 reps without UE support. CGA      Ambulation/Gait   Ambulation/Gait Yes    Ambulation/Gait Assistance 5: Supervision    Ambulation/Gait Assistance Details Completed gait training without AD x 345 ft, cues for step length and maintaining gait speed. supervision    Ambulation Distance (Feet) 345 Feet    Assistive device None    Gait Pattern Step-through pattern;Decreased step length - right;Decreased step length - left  Ambulation Surface Level;Indoor      Exercises   Exercises Knee/Hip      Knee/Hip Exercises: Standing   Heel Raises Both;1 set;15 reps;Limitations    Heel Raises Limitations with 3# ankle weights added, light UE support. Cues for proper technique    Knee Flexion Strengthening;Both;10 reps;Limitations;2 sets    Knee Flexion Limitations standing hamstring curl with 3# ankle weights, completed 2 x 10 reps bilaterally, light UE support from chair    Hip Flexion AROM;Stengthening;Both;2 sets;10 reps;Knee bent;Limitations    Hip Flexion Limitations alteranting standing marching working on improved hip/knee flexion with 3# ankle weights bilaterally.    Hip Abduction AROM;Stengthening;Both;2 sets;10 reps;Knee straight;Limitations    Abduction Limitations  with addition of 3# ankle weights, cues to keep toes pointed forward. light UE support required.               Balance Exercises - 12/21/21 0001       Balance Exercises: Standing   Tandem Stance Eyes open;Limitations    Tandem Stance Time on firms surface without UE support, completed alteranting 3/4th tandem stance 3 x 15 seconds bilaterally. Unable to complete without UE support.                  PT Short Term Goals - 11/30/21 1232       PT SHORT TERM GOAL #1   Title Patient will be indepenent with initial HEP for strength/balance (ALL STGs Due: 12/29/21)    Baseline No HEP established    Time 4    Period Weeks    Status New    Target Date 12/29/21      PT SHORT TERM GOAL #2   Title Patient will improve Berg Balance to >/= 40/56 to demo improved balance and reduced fall risk    Baseline 35/56    Time 4    Period Weeks    Status New      PT SHORT TERM GOAL #3   Title Patient will improve 5x sit <> stand to </= 18 seconds with UE support    Baseline 21.94 secs with UE support    Time 4    Period Weeks    Status New      PT SHORT TERM GOAL #4   Title Patient will improve TUG to </= 30 seconds with LRAD to demo reduced fall risk    Baseline 36.72 secs    Time 4    Period Weeks    Status New      PT SHORT TERM GOAL #5   Title Pt will improve gait speed to >/= 1.6 ft/sec to demo improved community mobility.    Baseline 1.21 ft/sec    Time 4    Period Weeks    Status New               PT Long Term Goals - 11/30/21 1243       PT LONG TERM GOAL #1   Title Patient will be indepenent with initial HEP for strength/balance (ALL LTGs Due: 01/26/22)    Baseline no HEP established    Time 8    Period Weeks    Status New    Target Date 01/26/22      PT LONG TERM GOAL #2   Title Patient will improve Berg Balance to >/= 45/56 to demo improved balance and reduced fall risk    Baseline 35/56    Time 8    Period Weeks    Status New  PT LONG TERM  GOAL #3   Title Patient will improve 5x sit <> stand to </= 15 seconds with UE support    Baseline 21.94 secs    Time 8    Period Weeks    Status New      PT LONG TERM GOAL #4   Title Patient will improve TUG to </= 20 seconds with LRAD to demo reduced fall risk    Baseline 36.72 secs    Time 8    Period Weeks    Status New      PT LONG TERM GOAL #5   Title Pt will improve gait speed to >/= 2.0 ft/sec to demo improved community mobility.    Baseline 1.21 ft/sec    Time 8    Period Weeks    Status New      Additional Long Term Goals   Additional Long Term Goals Yes      PT LONG TERM GOAL #6   Title Patient will be able to ambulate >/= 500 ft with LRAD on level surfaces with supervision to demo improved household/community mobility    Baseline 75 ft RW CGA    Time 8    Period Weeks    Status New      PT LONG TERM GOAL #7   Title Patient will imrpove FOTO to >/= 65%    Baseline 48%    Time 8    Period Weeks    Status New                   Plan - 12/21/21 1125     Clinical Impression Statement Continued to focus on improved strengthing and balance training, more fatigue/soreness noted today requiring more frequent rest breaks. Continue to demo improved endurance with ambulation without use of AD. Will continue per POC.    Personal Factors and Comorbidities Comorbidity 3+;Transportation    Comorbidities TB, fatty liver, calcification of aorta, HA, HTN, remote h/o cocaine abuse, ETOH abuse, GERD, Hypothyroidism    Examination-Activity Limitations Transfers;Locomotion Level;Stairs;Stand    Examination-Participation Restrictions Cleaning;Community Activity;Driving    Stability/Clinical Decision Making Evolving/Moderate complexity    Rehab Potential Good    PT Frequency 2x / week    PT Duration 8 weeks    PT Treatment/Interventions ADLs/Self Care Home Management;Aquatic Therapy;Cryotherapy;Moist Heat;DME Instruction;Gait training;Stair training;Functional mobility  training;Therapeutic activities;Neuromuscular re-education;Balance training;Therapeutic exercise;Patient/family education;Orthotic Fit/Training;Manual techniques;Passive range of motion    PT Next Visit Plan STGs due next week; continue to work on gait with straight cane with quad tip vs no AD; continue to work on LE strengthening and balance training    PT Home Exercise Plan Access Code: [email or web address]    Consulted and Agree with Plan of Care Patient             Patient will benefit from skilled therapeutic intervention in order to improve the following deficits and impairments:  Abnormal gait, Decreased balance, Decreased endurance, Difficulty walking, Pain, Decreased strength, Decreased knowledge of use of DME, Decreased activity tolerance  Visit Diagnosis: Other abnormalities of gait and mobility  Difficulty in walking, not elsewhere classified  Unsteadiness on feet  Muscle weakness (generalized)     Problem List Patient Active Problem List   Diagnosis Date Noted   Stroke ([place]) 11/09/2021   Essential hypertension 11/09/2021   Noncompliance with medication regimen 11/09/2021   Cerebral brain hemorrhage ([place]) 11/09/2021   Gouty arthritis 10/26/2020   Need for hepatitis B vaccination 01/12/2016   Calcification of aorta (  [place])    Recurrent Clostridium difficile diarrhea 11/18/2015   Macrocytic anemia 11/18/2015   Nausea and vomiting 11/18/2015   Leucocytosis 11/17/2015   Protein-calorie malnutrition, severe 11/17/2015   Hypomagnesemia 09/08/2015   Hypokalemia 09/07/2015   Tobacco use disorder 09/07/2015   Cocaine abuse ([place]) 05/27/2015   Anemia 01/29/2013   Nausea vomiting and diarrhea 01/26/2013   Esophagitis 01/26/2013   Anxiety 03/01/2008   Alcohol abuse 03/01/2008   Elevated LFTs 03/01/2008    Jones Bales, PT, DPT 12/21/2021, 11:45 AM  [facility] [address] [place], [place], [address] Phone:  [phone]   Fax:  [phone]  Name: SHAKETTA RILL MRN: [id number] Date of Birth: 1960-12-16

## 2021-12-26 ENCOUNTER — Other Ambulatory Visit: Payer: Self-pay

## 2021-12-26 ENCOUNTER — Encounter: Payer: Self-pay | Admitting: Physical Therapy

## 2021-12-26 ENCOUNTER — Ambulatory Visit: Payer: 59 | Admitting: Physical Therapy

## 2021-12-26 DIAGNOSIS — R2681 Unsteadiness on feet: Secondary | ICD-10-CM

## 2021-12-26 DIAGNOSIS — M6281 Muscle weakness (generalized): Secondary | ICD-10-CM | POA: Diagnosis not present

## 2021-12-26 DIAGNOSIS — R262 Difficulty in walking, not elsewhere classified: Secondary | ICD-10-CM | POA: Diagnosis not present

## 2021-12-26 DIAGNOSIS — R2689 Other abnormalities of gait and mobility: Secondary | ICD-10-CM | POA: Diagnosis not present

## 2021-12-26 NOTE — Therapy (Signed)
[facility] [address] [place], [place], [address] Phone: [phone]   Fax:  [phone]  Physical Therapy Treatment  Patient Details  Name: Holly Phelps MRN: [id number] Date of Birth: March 12, 1961 Referring Provider (PT): Aline August, MD   Encounter Date: 12/26/2021   PT End of Session - 12/26/21 1024     Visit Number 7    Number of Visits 17    Date for PT Re-Evaluation 01/26/22    Authorization Type Aetna (VL: 34)    PT Start Time 59    PT Stop Time 1056   pt needed to leave early so spouse can get sister in law to surgery appt at 11:15   PT Time Calculation (min) 38 min    Equipment Utilized During Treatment Gait belt    Activity Tolerance Patient tolerated treatment well    Behavior During Therapy WFL for tasks assessed/performed             Past Medical History:  Diagnosis Date   Allergy    Anemia    Calcification of aorta ([place])    Fatty liver    GERD (gastroesophageal reflux disease)    Headache    Hypertension    Hypothyroidism    Tuberculosis    as baby    Past Surgical History:  Procedure Laterality Date   BACK SURGERY     x 2   COLONOSCOPY N/A 03/13/2013   Procedure: COLONOSCOPY;  Surgeon: Leighton Ruff, MD;  Location: [facility];  Service: Endoscopy;  Laterality: N/A;   LAPAROSCOPIC APPENDECTOMY      There were no vitals filed for this visit.   Subjective Assessment - 12/26/21 1023     Subjective No new complaints. No falls or pain to report. Has been walking short distance at home with no device with no issues.    Pertinent History TB, fatty liver, calcification of aorta, HA, HTN, remote h/o cocaine abuse, ETOH abuse, GERD, Hypothyroidism    Limitations Standing;House hold activities;Walking    Diagnostic tests MRI of the brain showed Extensive atrophy, Extensive chronic microvascular ischemia, Multiple areas of chronic microhemorrhage in the brain including the pons,  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    Currently in Pain? No/denies    Pain Score 0-No pain                [facility] PT Assessment - 12/26/21 1026       Berg Balance Test   Sit to Stand Able to stand without using hands and stabilize independently    Standing Unsupported Able to stand safely 2 minutes    Sitting with Back Unsupported but Feet Supported on Floor or Stool Able to sit safely and securely 2 minutes    Stand to Sit Controls descent by using hands    Transfers Able to transfer safely, definite need of hands    Standing Unsupported with Eyes Closed Able to stand 10 seconds with supervision    Standing Unsupported with Feet Together Able to place feet together independently and stand for 1 minute with supervision    From Standing, Reach Forward with Outstretched Arm Can reach forward >12 cm safely (5")   6.5 inches   From Standing Position, Pick up Object from Floor Able to pick up shoe safely and easily      Timed Up and Go Test   TUG Normal TUG    Normal TUG (seconds) 23.28  sec's wtth RW; 16.65 sec's with no AD, min guard assist               [facility] Adult PT Treatment/Exercise - 12/26/21 1026       Transfers   Transfers Sit to Stand;Stand to Sit    Sit to Stand 5: Supervision;With upper extremity assist;From bed;From chair/3-in-1    Five time sit to stand comments  16.50 sec's with bil UE support from standard height chair.    Stand to Sit 5: Supervision;With upper extremity assist;To bed;To chair/3-in-1      Ambulation/Gait   Ambulation/Gait Yes    Ambulation/Gait Assistance 5: Supervision    Ambulation Distance (Feet) --   around clinic with session   Assistive device None    Gait Pattern Step-through pattern;Decreased step length - right;Decreased step length - left    Ambulation Surface Level;Indoor    Gait velocity 18.93 seconds= 1.73  ft/sec with RW;  17.84 seconds=   1.83 ft/sec no AD                       PT Short Term Goals - 12/26/21 1024       PT SHORT TERM GOAL #1   Title Patient will be indepenent with initial HEP for strength/balance (ALL STGs Due: 12/29/21)    Baseline 27/23: reports no issues with them, still challenging.    Status Achieved      PT SHORT TERM GOAL #2   Title Patient will improve Berg Balance to >/= 40/56 to demo improved balance and reduced fall risk    Baseline 35/56    Time 4    Period Weeks    Status On-going      PT SHORT TERM GOAL #3   Title Patient will improve 5x sit <> stand to </= 18 seconds with UE support    Baseline 12/26/21: 16.50 sec's with UE support from standard height chair    Time --    Period --    Status Achieved      PT SHORT TERM GOAL #4   Title Patient will improve TUG to </= 30 seconds with LRAD to demo reduced fall risk    Baseline 12/26/21: 23.28 sec's with RW, then 16.65 sec's no device with min guard assist    Time --    Period --    Status Achieved      PT SHORT TERM GOAL #5   Title Pt will improve gait speed to >/= 1.6 ft/sec to demo improved community mobility.    Baseline 12/26/21: 1.73 ft/sec with RW Mod I, then 1.83 ft/sec no AD with min guard assist    Time --    Period --    Status Achieved               PT Long Term Goals - 11/30/21 1243       PT LONG TERM GOAL #1   Title Patient will be indepenent with initial HEP for strength/balance (ALL LTGs Due: 01/26/22)    Baseline no HEP established    Time 8    Period Weeks    Status New    Target Date 01/26/22      PT LONG TERM GOAL #2   Title Patient will improve Berg Balance to >/= 45/56 to demo improved balance and reduced fall risk    Baseline 35/56    Time 8    Period Weeks    Status New  PT LONG TERM GOAL #3   Title Patient will improve 5x sit <> stand to </= 15 seconds with UE support    Baseline 21.94 secs    Time 8    Period Weeks    Status New      PT LONG TERM GOAL #4   Title  Patient will improve TUG to </= 20 seconds with LRAD to demo reduced fall risk    Baseline 36.72 secs    Time 8    Period Weeks    Status New      PT LONG TERM GOAL #5   Title Pt will improve gait speed to >/= 2.0 ft/sec to demo improved community mobility.    Baseline 1.21 ft/sec    Time 8    Period Weeks    Status New      Additional Long Term Goals   Additional Long Term Goals Yes      PT LONG TERM GOAL #6   Title Patient will be able to ambulate >/= 500 ft with LRAD on level surfaces with supervision to demo improved household/community mobility    Baseline 75 ft RW CGA    Time 8    Period Weeks    Status New      PT LONG TERM GOAL #7   Title Patient will imrpove FOTO to >/= 65%    Baseline 48%    Time 8    Period Weeks    Status New                   Plan - 12/26/21 1024     Clinical Impression Statement Today's skilled session began to check progress toward STGs with goals checked met today. Will plan to complete the Berg Balance Test next session (ran out of time today) to finish goal check. The pt is making progress toward goals and should benefit from continued PT to progress toward unmet goals.    Personal Factors and Comorbidities Comorbidity 3+;Transportation    Comorbidities TB, fatty liver, calcification of aorta, HA, HTN, remote h/o cocaine abuse, ETOH abuse, GERD, Hypothyroidism    Examination-Activity Limitations Transfers;Locomotion Level;Stairs;Stand    Examination-Participation Restrictions Cleaning;Community Activity;Driving    Stability/Clinical Decision Making Evolving/Moderate complexity    Rehab Potential Good    PT Frequency 2x / week    PT Duration 8 weeks    PT Treatment/Interventions ADLs/Self Care Home Management;Aquatic Therapy;Cryotherapy;Moist Heat;DME Instruction;Gait training;Stair training;Functional mobility training;Therapeutic activities;Neuromuscular re-education;Balance training;Therapeutic exercise;Patient/family  education;Orthotic Fit/Training;Manual techniques;Passive range of motion    PT Next Visit Plan finish Berg Balance test to complete STG check;  continue to work on gait with straight cane with quad tip vs no AD; continue to work on LE strengthening and balance training    PT Home Exercise Plan Access Code: [email or web address]    Consulted and Agree with Plan of Care Patient             Patient will benefit from skilled therapeutic intervention in order to improve the following deficits and impairments:  Abnormal gait, Decreased balance, Decreased endurance, Difficulty walking, Pain, Decreased strength, Decreased knowledge of use of DME, Decreased activity tolerance  Visit Diagnosis: Other abnormalities of gait and mobility  Difficulty in walking, not elsewhere classified  Unsteadiness on feet  Muscle weakness (generalized)     Problem List Patient Active Problem List   Diagnosis Date Noted   Stroke [facility]) 11/09/2021   Essential hypertension 11/09/2021   Noncompliance with medication regimen 11/09/2021  Cerebral brain hemorrhage ([place]) 11/09/2021   Gouty arthritis 10/26/2020   Need for hepatitis B vaccination 01/12/2016   Calcification of aorta ([facility])    Recurrent Clostridium difficile diarrhea 11/18/2015   Macrocytic anemia 11/18/2015   Nausea and vomiting 11/18/2015   Leucocytosis 11/17/2015   Protein-calorie malnutrition, severe 11/17/2015   Hypomagnesemia 09/08/2015   Hypokalemia 09/07/2015   Tobacco use disorder 09/07/2015   Cocaine abuse ([place]) 05/27/2015   Anemia 01/29/2013   Nausea vomiting and diarrhea 01/26/2013   Esophagitis 01/26/2013   Anxiety 03/01/2008   Alcohol abuse 03/01/2008   Elevated LFTs 03/01/2008    Willow Ora, PTA, [facility] [address] [place], [place] [address] [phone] 12/26/21, 11:29 AM 16  Name: JONET MATHIES MRN: [id number] Date of Birth: 10-20-1961

## 2021-12-28 ENCOUNTER — Ambulatory Visit: Payer: 59 | Admitting: Physical Therapy

## 2022-01-01 DIAGNOSIS — R059 Cough, unspecified: Secondary | ICD-10-CM | POA: Diagnosis not present

## 2022-01-01 DIAGNOSIS — J069 Acute upper respiratory infection, unspecified: Secondary | ICD-10-CM | POA: Diagnosis not present

## 2022-01-01 DIAGNOSIS — J988 Other specified respiratory disorders: Secondary | ICD-10-CM | POA: Diagnosis not present

## 2022-01-02 ENCOUNTER — Ambulatory Visit: Payer: 59

## 2022-01-04 ENCOUNTER — Ambulatory Visit: Payer: 59 | Admitting: Physical Therapy

## 2022-01-08 ENCOUNTER — Ambulatory Visit: Payer: Self-pay | Admitting: Neurology

## 2022-01-09 ENCOUNTER — Other Ambulatory Visit: Payer: Self-pay

## 2022-01-09 ENCOUNTER — Ambulatory Visit: Payer: 59 | Admitting: Physical Therapy

## 2022-01-09 ENCOUNTER — Encounter: Payer: Self-pay | Admitting: Physical Therapy

## 2022-01-09 DIAGNOSIS — R2689 Other abnormalities of gait and mobility: Secondary | ICD-10-CM

## 2022-01-09 DIAGNOSIS — M6281 Muscle weakness (generalized): Secondary | ICD-10-CM | POA: Diagnosis not present

## 2022-01-09 DIAGNOSIS — R262 Difficulty in walking, not elsewhere classified: Secondary | ICD-10-CM | POA: Diagnosis not present

## 2022-01-09 DIAGNOSIS — R2681 Unsteadiness on feet: Secondary | ICD-10-CM

## 2022-01-09 NOTE — Therapy (Signed)
[facility] [address] [place], [place], [address] Phone: [phone]   Fax:  [phone]  Physical Therapy Treatment  Patient Details  Name: Holly Phelps MRN: [id number] Date of Birth: [birth year] Referring Provider (PT): Aline August, MD   Encounter Date: 01/09/2022   PT End of Session - 01/09/22 1026     Visit Number 8    Number of Visits 17    Date for PT Re-Evaluation 01/26/22    Authorization Type Aetna (VL: 25)    PT Start Time 1017    PT Stop Time 1056    PT Time Calculation (min) 39 min    Equipment Utilized During Treatment Gait belt    Activity Tolerance Patient tolerated treatment well    Behavior During Therapy WFL for tasks assessed/performed             Past Medical History:  Diagnosis Date   Allergy    Anemia    Calcification of aorta ([place])    Fatty liver    GERD (gastroesophageal reflux disease)    Headache    Hypertension    Hypothyroidism    Tuberculosis    as baby    Past Surgical History:  Procedure Laterality Date   BACK SURGERY     x 2   COLONOSCOPY N/A 03/13/2013   Procedure: COLONOSCOPY;  Surgeon: Leighton Ruff, MD;  Location: [facility];  Service: Endoscopy;  Laterality: N/A;   LAPAROSCOPIC APPENDECTOMY      There were no vitals filed for this visit.   Subjective Assessment - 01/09/22 1021     Subjective Had a fall last week while sick. Tripped over clothes on the floor. Fell into the wall and hit her head. No headaches or concussion symptoms after. Spouse helped her back up. Was not using walker as she had just stepped out of bed right before the fall. Did see her doctor about her illness last week and was given Tussin Perals for cough (Benzonatate), has about 1-2 days left on this priscription. He diagnoses her with URI and she is to follow up if cough does not improve. Pt feels as if her cough is much better. Covid was negative.    Patient is accompained by:  Family member    Pertinent History TB, fatty liver, calcification of aorta, HA, HTN, remote h/o cocaine abuse, ETOH abuse, GERD, Hypothyroidism    Limitations Standing;House hold activities;Walking    Diagnostic tests MRI of the brain showed Extensive atrophy, Extensive chronic microvascular ischemia, Multiple areas of chronic microhemorrhage in the brain including the pons, 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    Patient Stated Goals Improve Walking; Get Back to Cooking    Currently in Pain? No/denies    Pain Score 0-No pain                [facility] PT Assessment - 01/09/22 1027       Berg Balance Test   Sit to Stand Able to stand without using hands and stabilize independently    Standing Unsupported Able to stand safely 2 minutes    Sitting with Back Unsupported but Feet Supported on Floor or Stool Able to sit safely and securely 2 minutes    Stand to Sit Controls descent by using hands    Transfers Able to transfer safely, definite need of hands    Standing Unsupported with Eyes Closed Able to stand 10 seconds with supervision  Standing Unsupported with Feet Together Able to place feet together independently and stand for 1 minute with supervision    From Standing, Reach Forward with Outstretched Arm Can reach forward >12 cm safely (5")   6.5 inches   From Standing Position, Pick up Object from [place] to pick up shoe safely and easily    From Standing Position, Turn to Look Behind Over each Shoulder Looks behind one side only/other side shows less weight shift   started here 01/09/22/ all others done last session; right>left side   Turn 360 Degrees Able to turn 360 degrees safely but slowly   6-7 sec's each way   Standing Unsupported, Alternately Place Feet on Step/Stool Able to complete 4 steps without aid or supervision   6 taps/50.28 sec's with UE's reaching to touch something, not actually  touching anything   Standing Unsupported, One Foot in Front Able to take small step independently and hold 30 seconds    Standing on One Leg Tries to lift leg/unable to hold 3 seconds but remains standing independently    Total Score 41    Berg comment: 41/56 37-45 significant risk                    [facility] Adult PT Treatment/Exercise - 01/09/22 1027       Transfers   Transfers Sit to Stand;Stand to Sit    Sit to Stand 5: Supervision;With upper extremity assist;From bed;From chair/3-in-1    Stand to Sit 5: Supervision;With upper extremity assist;To bed;To chair/3-in-1      Ambulation/Gait   Ambulation/Gait Yes    Ambulation/Gait Assistance 5: Supervision    Ambulation Distance (Feet) --   into/out of clinic for session   Assistive device Rolling walker    Gait Pattern Step-through pattern;Decreased step length - right;Decreased step length - left    Ambulation Surface Level;Indoor      Knee/Hip Exercises: Standing   Heel Raises Both;2 sets;15 reps;Limitations    Heel Raises Limitations with 3# ankle weights added, light UE support. Cues for proper technique    Hip Flexion AROM;Stengthening;Both;1 set;10 reps;Knee bent;Limitations    Hip Flexion Limitations alteranting standing marching working on improved hip/knee flexion with 3# ankle weights bilaterally.      Knee/Hip Exercises: Seated   Long Arc Quad AROM;Strengthening;Both;10 reps;Weights;Limitations;2 sets    [employer] Weight 3 lbs.    Long Arc Quad Limitations alternating sides    Heel Slides AROM;AAROM;Strengthening;Both;1 set;10 reps;Limitations    Heel Slides Limitations pillowcase under foot- had pt sliding foot back for knee flexion, then forward for knee extension. assist needed for controlled movements with red band resistance.                       PT Short Term Goals - 01/09/22 1436       PT SHORT TERM GOAL #1   Title Patient will be indepenent with initial HEP for strength/balance  (ALL STGs Due: 12/29/21)    Baseline 27/23: reports no issues with them, still challenging.    Status Achieved      PT SHORT TERM GOAL #2   Title Patient will improve Berg Balance to >/= 40/56 to demo improved balance and reduced fall risk    Baseline 01/09/22: 41/56 scored today    Status Achieved      PT SHORT TERM GOAL #3   Title Patient will improve 5x sit <> stand to </= 18 seconds with UE support  Baseline 12/26/21: 16.50 sec's with UE support from standard height chair    Status Achieved      PT SHORT TERM GOAL #4   Title Patient will improve TUG to </= 30 seconds with LRAD to demo reduced fall risk    Baseline 12/26/21: 23.28 sec's with RW, then 16.65 sec's no device with min guard assist    Status Achieved      PT SHORT TERM GOAL #5   Title Pt will improve gait speed to >/= 1.6 ft/sec to demo improved community mobility.    Baseline 12/26/21: 1.73 ft/sec with RW Mod I, then 1.83 ft/sec no AD with min guard assist    Status Achieved               PT Long Term Goals - 11/30/21 1243       PT LONG TERM GOAL #1   Title Patient will be indepenent with initial HEP for strength/balance (ALL LTGs Due: 01/26/22)    Baseline no HEP established    Time 8    Period Weeks    Status New    Target Date 01/26/22      PT LONG TERM GOAL #2   Title Patient will improve Berg Balance to >/= 45/56 to demo improved balance and reduced fall risk    Baseline 35/56    Time 8    Period Weeks    Status New      PT LONG TERM GOAL #3   Title Patient will improve 5x sit <> stand to </= 15 seconds with UE support    Baseline 21.94 secs    Time 8    Period Weeks    Status New      PT LONG TERM GOAL #4   Title Patient will improve TUG to </= 20 seconds with LRAD to demo reduced fall risk    Baseline 36.72 secs    Time 8    Period Weeks    Status New      PT LONG TERM GOAL #5   Title Pt will improve gait speed to >/= 2.0 ft/sec to demo improved community mobility.    Baseline 1.21  ft/sec    Time 8    Period Weeks    Status New      Additional Long Term Goals   Additional Long Term Goals Yes      PT LONG TERM GOAL #6   Title Patient will be able to ambulate >/= 500 ft with LRAD on level surfaces with supervision to demo improved household/community mobility    Baseline 75 ft RW CGA    Time 8    Period Weeks    Status New      PT LONG TERM GOAL #7   Title Patient will imrpove FOTO to >/= 65%    Baseline 48%    Time 8    Period Weeks    Status New                   Plan - 01/09/22 1027     Clinical Impression Statement Skilled session focused on completion of Berg Balance test with score of 41/56, improved from 35/56. Remainder of session focused on strengthening with rest breaks taken as needed due to fatigue and continued shortness of breath/coughing from recent illness. Pt is making progress toward goals and should benefit from continued PT. Pt is to call her PCP if she does not feel better after completion of her current  prescription.    Personal Factors and Comorbidities Comorbidity 3+;Transportation    Comorbidities TB, fatty liver, calcification of aorta, HA, HTN, remote h/o cocaine abuse, ETOH abuse, GERD, Hypothyroidism    Examination-Activity Limitations Transfers;Locomotion Level;Stairs;Stand    Examination-Participation Restrictions Cleaning;Community Activity;Driving    Stability/Clinical Decision Making Evolving/Moderate complexity    Rehab Potential Good    PT Frequency 2x / week    PT Duration 8 weeks    PT Treatment/Interventions ADLs/Self Care Home Management;Aquatic Therapy;Cryotherapy;Moist Heat;DME Instruction;Gait training;Stair training;Functional mobility training;Therapeutic activities;Neuromuscular re-education;Balance training;Therapeutic exercise;Patient/family education;Orthotic Fit/Training;Manual techniques;Passive range of motion    PT Next Visit Plan continue to work on gait with straight cane with quad tip vs no AD;  continue to work on LE strengthening and balance training    PT Home Exercise Plan Access Code: [email or web address]    Consulted and Agree with Plan of Care Patient             Patient will benefit from skilled therapeutic intervention in order to improve the following deficits and impairments:  Abnormal gait, Decreased balance, Decreased endurance, Difficulty walking, Pain, Decreased strength, Decreased knowledge of use of DME, Decreased activity tolerance  Visit Diagnosis: Other abnormalities of gait and mobility  Difficulty in walking, not elsewhere classified  Unsteadiness on feet  Muscle weakness (generalized)     Problem List Patient Active Problem List   Diagnosis Date Noted   Stroke ([place]) 11/09/2021   Essential hypertension 11/09/2021   Noncompliance with medication regimen 11/09/2021   Cerebral brain hemorrhage ([place]) 11/09/2021   Gouty arthritis 10/26/2020   Need for hepatitis B vaccination 01/12/2016   Calcification of aorta ([facility])    Recurrent Clostridium difficile diarrhea 11/18/2015   Macrocytic anemia 11/18/2015   Nausea and vomiting 11/18/2015   Leucocytosis 11/17/2015   Protein-calorie malnutrition, severe 11/17/2015   Hypomagnesemia 09/08/2015   Hypokalemia 09/07/2015   Tobacco use disorder 09/07/2015   Cocaine abuse ([place]) 05/27/2015   Anemia 01/29/2013   Nausea vomiting and diarrhea 01/26/2013   Esophagitis 01/26/2013   Anxiety 03/01/2008   Alcohol abuse 03/01/2008   Elevated LFTs 03/01/2008    Willow Ora, PTA, [facility] [address] [place], [place] [address] [phone] 01/09/22, 2:37 PM   Name: TIASIA WEBERG MRN: [id number] Date of Birth: 1961-03-14

## 2022-01-11 ENCOUNTER — Ambulatory Visit: Payer: 59 | Admitting: Physical Therapy

## 2022-01-16 ENCOUNTER — Encounter: Payer: Self-pay | Admitting: Physical Therapy

## 2022-01-16 ENCOUNTER — Other Ambulatory Visit: Payer: Self-pay

## 2022-01-16 ENCOUNTER — Ambulatory Visit: Payer: 59 | Admitting: Physical Therapy

## 2022-01-16 DIAGNOSIS — R262 Difficulty in walking, not elsewhere classified: Secondary | ICD-10-CM | POA: Diagnosis not present

## 2022-01-16 DIAGNOSIS — R2681 Unsteadiness on feet: Secondary | ICD-10-CM

## 2022-01-16 DIAGNOSIS — R2689 Other abnormalities of gait and mobility: Secondary | ICD-10-CM | POA: Diagnosis not present

## 2022-01-16 DIAGNOSIS — M6281 Muscle weakness (generalized): Secondary | ICD-10-CM | POA: Diagnosis not present

## 2022-01-16 NOTE — Therapy (Signed)
[facility] [address] [place], [place], [address] Phone: [phone]   Fax:  [phone]  Physical Therapy Treatment  Patient Details  Name: Holly Phelps MRN: [id number] Date of Birth: June 24, 1961 Referring Provider (PT): Aline August, MD   Encounter Date: 01/16/2022   PT End of Session - 01/16/22 1022     Visit Number 9    Number of Visits 17    Date for PT Re-Evaluation 01/26/22    Authorization Type Aetna (VL: 45)    PT Start Time 1017    PT Stop Time 1100    PT Time Calculation (min) 43 min    Equipment Utilized During Treatment Gait belt    Activity Tolerance Patient tolerated treatment well    Behavior During Therapy WFL for tasks assessed/performed             Past Medical History:  Diagnosis Date   Allergy    Anemia    Calcification of aorta ([place])    Fatty liver    GERD (gastroesophageal reflux disease)    Headache    Hypertension    Hypothyroidism    Tuberculosis    as baby    Past Surgical History:  Procedure Laterality Date   BACK SURGERY     x 2   COLONOSCOPY N/A 03/13/2013   Procedure: COLONOSCOPY;  Surgeon: Leighton Ruff, MD;  Location: [facility];  Service: Endoscopy;  Laterality: N/A;   LAPAROSCOPIC APPENDECTOMY      There were no vitals filed for this visit.   Subjective Assessment - 01/16/22 1020     Subjective Still having issues with a cough. Has not called MD back as she is waiting till her spouse gets back in town to be able to take her. Her sister in law is staying with her for now and she is not sure how to tell her sister in law to get to the MD office. Spouse comes back tomorrow.    Patient is accompained by: Family member   sister in law   Pertinent History TB, fatty liver, calcification of aorta, HA, HTN, remote h/o cocaine abuse, ETOH abuse, GERD, Hypothyroidism    Limitations Standing;House hold activities;Walking    Diagnostic tests MRI of the brain  showed Extensive atrophy, Extensive chronic microvascular ischemia, Multiple areas of chronic microhemorrhage in the brain including the pons, 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    Patient Stated Goals Improve Walking; Get Back to Cooking    Currently in Pain? Yes    Pain Score 6     Pain Location Back    Pain Orientation Lower    Pain Descriptors / Indicators Aching;Sore    Pain Type Chronic pain    Pain Onset More than a month ago    Pain Frequency Intermittent    Aggravating Factors  unknown, coughing has excerbated her pain    Pain Relieving Factors tylenol, rest                  [facility] Adult PT Treatment/Exercise - 01/16/22 1023       Transfers   Transfers Sit to Stand;Stand to Sit    Sit to Stand 5: Supervision;With upper extremity assist;From bed;From chair/3-in-1    Stand to Sit 5: Supervision;With upper extremity assist;To bed;To chair/3-in-1    Number of Reps 10 reps;1 set    Comments from low mat with no UE support. cues for tall posture and  controlled descent.      Ambulation/Gait   Ambulation/Gait Yes    Ambulation/Gait Assistance 5: Supervision    Ambulation/Gait Assistance Details reminder cues to stay closer to walker    Ambulation Distance (Feet) 575 Feet   x1, plus around clinic with session   Assistive device Rolling walker    Gait Pattern Step-through pattern;Decreased step length - right;Decreased step length - left    Ambulation Surface Level;Indoor      High Level Balance   High Level Balance Activities Side stepping;Tandem walking;Marching forwards;Marching backwards   tandem forward/backwards   High Level Balance Comments blue mat in parallel bars: with UE support for side stepping x 4 laps each way, marching for 3 laps both ways and tandem gait for 3 laps both ways. cues for posture and ex form/technique with all activiites performed.                  Balance Exercises - 01/16/22 1051       Balance Exercises: Standing   Standing Eyes Closed Wide (BOA);Head turns;Foam/compliant surface;Other reps (comment);30 secs;Limitations    Standing Eyes Closed Limitations on airex no UE support with feet hip width apart for EC 30 sec's x 3 reps, then feet wider apart for EC head movements left<>right, up<>down for ~8-10 reps. min guard to min assist for balance.                  PT Short Term Goals - 01/09/22 1436       PT SHORT TERM GOAL #1   Title Patient will be indepenent with initial HEP for strength/balance (ALL STGs Due: 12/29/21)    Baseline 27/23: reports no issues with them, still challenging.    Status Achieved      PT SHORT TERM GOAL #2   Title Patient will improve Berg Balance to >/= 40/56 to demo improved balance and reduced fall risk    Baseline 01/09/22: 41/56 scored today    Status Achieved      PT SHORT TERM GOAL #3   Title Patient will improve 5x sit <> stand to </= 18 seconds with UE support    Baseline 12/26/21: 16.50 sec's with UE support from standard height chair    Status Achieved      PT SHORT TERM GOAL #4   Title Patient will improve TUG to </= 30 seconds with LRAD to demo reduced fall risk    Baseline 12/26/21: 23.28 sec's with RW, then 16.65 sec's no device with min guard assist    Status Achieved      PT SHORT TERM GOAL #5   Title Pt will improve gait speed to >/= 1.6 ft/sec to demo improved community mobility.    Baseline 12/26/21: 1.73 ft/sec with RW Mod I, then 1.83 ft/sec no AD with min guard assist    Status Achieved               PT Long Term Goals - 11/30/21 1243       PT LONG TERM GOAL #1   Title Patient will be indepenent with initial HEP for strength/balance (ALL LTGs Due: 01/26/22)    Baseline no HEP established    Time 8    Period Weeks    Status New    Target Date 01/26/22      PT LONG TERM GOAL #2   Title Patient will improve Berg Balance to >/= 45/56 to demo improved balance  and reduced fall risk    Baseline  35/56    Time 8    Period Weeks    Status New      PT LONG TERM GOAL #3   Title Patient will improve 5x sit <> stand to </= 15 seconds with UE support    Baseline 21.94 secs    Time 8    Period Weeks    Status New      PT LONG TERM GOAL #4   Title Patient will improve TUG to </= 20 seconds with LRAD to demo reduced fall risk    Baseline 36.72 secs    Time 8    Period Weeks    Status New      PT LONG TERM GOAL #5   Title Pt will improve gait speed to >/= 2.0 ft/sec to demo improved community mobility.    Baseline 1.21 ft/sec    Time 8    Period Weeks    Status New      Additional Long Term Goals   Additional Long Term Goals Yes      PT LONG TERM GOAL #6   Title Patient will be able to ambulate >/= 500 ft with LRAD on level surfaces with supervision to demo improved household/community mobility    Baseline 75 ft RW CGA    Time 8    Period Weeks    Status New      PT LONG TERM GOAL #7   Title Patient will imrpove FOTO to >/= 65%    Baseline 48%    Time 8    Period Weeks    Status New                   Plan - 01/16/22 1022     Clinical Impression Statement Today's skilled session continued to focus on activity tolerance with gait distance, strengthening and balance training. Limited due to patient not feeling well with cough/fatigue still. Pt should benefit from continued PT to progress toward unmet goals.    Personal Factors and Comorbidities Comorbidity 3+;Transportation    Comorbidities TB, fatty liver, calcification of aorta, HA, HTN, remote h/o cocaine abuse, ETOH abuse, GERD, Hypothyroidism    Examination-Activity Limitations Transfers;Locomotion Level;Stairs;Stand    Examination-Participation Restrictions Cleaning;Community Activity;Driving    Stability/Clinical Decision Making Evolving/Moderate complexity    Rehab Potential Good    PT Frequency 2x / week    PT Duration 8 weeks    PT Treatment/Interventions  ADLs/Self Care Home Management;Aquatic Therapy;Cryotherapy;Moist Heat;DME Instruction;Gait training;Stair training;Functional mobility training;Therapeutic activities;Neuromuscular re-education;Balance training;Therapeutic exercise;Patient/family education;Orthotic Fit/Training;Manual techniques;Passive range of motion    PT Next Visit Plan Pt has 3 visits left on schedule- begin to look at goals and POC going forward; continue to work on gait with straight cane with quad tip vs no AD; continue to work on LE strengthening and balance training    PT Home Exercise Plan Access Code: [email or web address]    Consulted and Agree with Plan of Care Patient             Patient will benefit from skilled therapeutic intervention in order to improve the following deficits and impairments:  Abnormal gait, Decreased balance, Decreased endurance, Difficulty walking, Pain, Decreased strength, Decreased knowledge of use of DME, Decreased activity tolerance  Visit Diagnosis: Other abnormalities of gait and mobility  Difficulty in walking, not elsewhere classified  Unsteadiness on feet  Muscle weakness (generalized)     Problem List Patient Active Problem List   Diagnosis Date Noted   Stroke ([place]) 11/09/2021  Essential hypertension 11/09/2021   Noncompliance with medication regimen 11/09/2021   Cerebral brain hemorrhage ([place]) 11/09/2021   Gouty arthritis 10/26/2020   Need for hepatitis B vaccination 01/12/2016   Calcification of aorta ([facility])    Recurrent Clostridium difficile diarrhea 11/18/2015   Macrocytic anemia 11/18/2015   Nausea and vomiting 11/18/2015   Leucocytosis 11/17/2015   Protein-calorie malnutrition, severe 11/17/2015   Hypomagnesemia 09/08/2015   Hypokalemia 09/07/2015   Tobacco use disorder 09/07/2015   Cocaine abuse ([place]) 05/27/2015   Anemia 01/29/2013   Nausea vomiting and diarrhea 01/26/2013   Esophagitis 01/26/2013   Anxiety 03/01/2008   Alcohol abuse 03/01/2008   Elevated LFTs  03/01/2008    Willow Ora, PTA, [facility] [address] [place], [place] [address] [phone] 01/16/22, 11:54 AM   Name: Holly Phelps MRN: [id number] Date of Birth: April 30, 1961

## 2022-01-18 ENCOUNTER — Other Ambulatory Visit: Payer: Self-pay

## 2022-01-18 ENCOUNTER — Ambulatory Visit: Payer: 59 | Attending: Registered Nurse | Admitting: Physical Therapy

## 2022-01-18 ENCOUNTER — Encounter: Payer: Self-pay | Admitting: Physical Therapy

## 2022-01-18 DIAGNOSIS — R262 Difficulty in walking, not elsewhere classified: Secondary | ICD-10-CM

## 2022-01-18 DIAGNOSIS — M6281 Muscle weakness (generalized): Secondary | ICD-10-CM | POA: Diagnosis not present

## 2022-01-18 DIAGNOSIS — R2689 Other abnormalities of gait and mobility: Secondary | ICD-10-CM | POA: Diagnosis not present

## 2022-01-18 DIAGNOSIS — R2681 Unsteadiness on feet: Secondary | ICD-10-CM

## 2022-01-18 NOTE — Therapy (Addendum)
OUTPATIENT PHYSICAL THERAPY TREATMENT NOTE/RE-CERTIFICATION   Patient Name: Holly Phelps MRN: [id number] DOB:1961-06-25, 61 y.o., female Today's Date: 01/19/2022   PCP: Maximiano Coss, NP REFERRING PROVIDER: Aline August, MD    PT End of Session - 01/18/22 1022     Visit Number 10    Number of Visits 17    Date for PT Re-Evaluation 02/23/22    Authorization Type Aetna (VL: 58)    PT Start Time 1018    PT Stop Time 1100    PT Time Calculation (min) 42 min    Equipment Utilized During Treatment Gait belt    Activity Tolerance Patient tolerated treatment well    Behavior During Therapy WFL for tasks assessed/performed             Past Medical History:  Diagnosis Date   Allergy    Anemia    Calcification of aorta ([place])    Fatty liver    GERD (gastroesophageal reflux disease)    Headache    Hypertension    Hypothyroidism    Tuberculosis    as baby   Past Surgical History:  Procedure Laterality Date   BACK SURGERY     x 2   COLONOSCOPY N/A 03/13/2013   Procedure: COLONOSCOPY;  Surgeon: Leighton Ruff, MD;  Location: [facility];  Service: Endoscopy;  Laterality: N/A;   LAPAROSCOPIC APPENDECTOMY     Patient Active Problem List   Diagnosis Date Noted   Stroke ([place]) 11/09/2021   Essential hypertension 11/09/2021   Noncompliance with medication regimen 11/09/2021   Cerebral brain hemorrhage ([place]) 11/09/2021   Gouty arthritis 10/26/2020   Need for hepatitis B vaccination 01/12/2016   Calcification of aorta ([facility])    Recurrent Clostridium difficile diarrhea 11/18/2015   Macrocytic anemia 11/18/2015   Nausea and vomiting 11/18/2015   Leucocytosis 11/17/2015   Protein-calorie malnutrition, severe 11/17/2015   Hypomagnesemia 09/08/2015   Hypokalemia 09/07/2015   Tobacco use disorder 09/07/2015   Cocaine abuse ([place]) 05/27/2015   Anemia 01/29/2013   Nausea vomiting and diarrhea 01/26/2013   Esophagitis 01/26/2013   Anxiety 03/01/2008   Alcohol abuse  03/01/2008   Elevated LFTs 03/01/2008    REFERRING DIAG: CVA   THERAPY DIAG:  Other abnormalities of gait and mobility  Difficulty in walking, not elsewhere classified  Unsteadiness on feet  Muscle weakness (generalized)  PERTINENT HISTORY: TB, fatty liver, calcification of aorta, HA, HTN, remote h/o cocaine abuse, ETOH abuse, GERD, Hypothyroidism   PRECAUTIONS: Fall   SUBJECTIVE: Reports her cough is getting better. No falls. Having some lower back pain from the rainy weather today.   PAIN:  Are you having pain? Yes NPRS scale: 4/10 Pain location: lower back Pain orientation: Lower  PAIN TYPE: Chronic Pain description: aching  Aggravating factors: rainy weather Relieving factors: resting    TODAY'S TREATMENT:  01/18/22: Self Care: Reviewed verbally HEP. Reports they are still challenging. Currently performing them every other day.    [facility] PT Assessment - 01/18/22 1030       Transfers   Transfers Sit to Stand;Stand to Sit    Sit to Stand 5: Supervision;With upper extremity assist;From bed;From chair/3-in-1    Five time sit to stand comments  13.0 sec's no UE support from standard height surface    Stand to Sit 5: Supervision;With upper extremity assist;To bed;To chair/3-in-1      Ambulation/Gait   Ambulation/Gait Yes    Ambulation/Gait Assistance 5: Supervision;4: Min guard    Ambulation/Gait Assistance Details use of walker  to enter/exit session. use of cane in session with supervision to min guard assist for balance/safety.    Ambulation Distance (Feet) 115 Feet   x1, plus around clinic with session   Assistive device Rolling walker;Straight cane    Gait Pattern Step-through pattern;Decreased step length - right;Decreased step length - left    Ambulation Surface Level;Indoor    Gait velocity 14.34 sec's= 2.29 ft/sec with straight cane with rubber quad tip      Standardized Balance Assessment   Standardized Balance Assessment Timed Up and Go Test;Berg Balance  Test      Berg Balance Test   Sit to Stand Able to stand without using hands and stabilize independently    Standing Unsupported Able to stand safely 2 minutes    Sitting with Back Unsupported but Feet Supported on Floor or Stool Able to sit safely and securely 2 minutes    Stand to Sit Sits safely with minimal use of hands    Transfers Able to transfer safely, minor use of hands    Standing Unsupported with Eyes Closed Able to stand 10 seconds safely    Standing Unsupported with Feet Together Able to place feet together independently and stand 1 minute safely    From Standing, Reach Forward with Outstretched Arm Can reach forward >12 cm safely (5")   7.5 inches   From Standing Position, Pick up Object from Floor Able to pick up shoe safely and easily    From Standing Position, Turn to Look Behind Over each Shoulder Looks behind one side only/other side shows less weight shift   right>left   Turn 360 Degrees Able to turn 360 degrees safely but slowly   4.28 toward right, 4.37 toward left   Standing Unsupported, Alternately Place Feet on Step/Stool Able to complete 4 steps without aid or supervision   need single HHA   Standing Unsupported, One Foot in Front Able to plae foot ahead of the other independently and hold 30 seconds    Standing on One Leg Tries to lift leg/unable to hold 3 seconds but remains standing independently    Total Score 46    Berg comment: 46-51 moderate risk      Timed Up and Go Test   TUG Normal TUG    Normal TUG (seconds) 13.62   sesc's with cane; 12.50 sec's no AD             PATIENT EDUCATION: Education details: progress toward goals and PT plan to recert Person educated: Patient Education method: Consulting civil engineer, Demonstration, and Verbal cues Education comprehension: verbalized understanding, returned demonstration, verbal cues required, and needs further education   HOME EXERCISE PROGRAM: Access Code: [email or web address]   PT Short Term Goals - 01/18/22 1022        PT SHORT TERM GOAL #1   Title Patient will be indepenent with initial HEP for strength/balance (ALL STGs Due: 12/29/21)    Baseline 27/23: reports no issues with them, still challenging.    Status Achieved      PT SHORT TERM GOAL #2   Title Patient will improve Berg Balance to >/= 40/56 to demo improved balance and reduced fall risk    Baseline 01/09/22: 41/56 scored today    Status Achieved      PT SHORT TERM GOAL #3   Title Patient will improve 5x sit <> stand to </= 18 seconds with UE support    Baseline 12/26/21: 16.50 sec's with UE support from standard height chair    Status  Achieved      PT SHORT TERM GOAL #4   Title Patient will improve TUG to </= 30 seconds with LRAD to demo reduced fall risk    Baseline 12/26/21: 23.28 sec's with RW, then 16.65 sec's no device with min guard assist    Status Achieved      PT SHORT TERM GOAL #5   Title Pt will improve gait speed to >/= 1.6 ft/sec to demo improved community mobility.    Baseline 12/26/21: 1.73 ft/sec with RW Mod I, then 1.83 ft/sec no AD with min guard assist    Status Achieved              PT Long Term Goals - 01/18/22 1022       PT LONG TERM GOAL #1   Title Patient will be indepenent with initial HEP for strength/balance (ALL LTGs Due: 01/26/22)    Baseline 01/18/22: reports performing current HEP with spouse assistance as needed and no issues. Will benefit from updating as pt progresses.    Time --    Period --    Status Achieved    Target Date --      PT LONG TERM GOAL #2   Title Patient will improve Berg Balance to >/= 45/56 to demo improved balance and reduced fall risk    Baseline 01/18/22: 46/56 scored today, improved from 35/56 to meet goal    Time --    Period --    Status Achieved      PT LONG TERM GOAL #3   Title Patient will improve 5x sit <> stand to </= 15 seconds with UE support    Baseline 01/18/22: 13.0 sec's no UE support from standard height surface    Time --    Period --    Status Achieved       PT LONG TERM GOAL #4   Title Patient will improve TUG to </= 20 seconds with LRAD to demo reduced fall risk    Baseline 01/18/22: 13.62 sec's with cane with supervision, 12.50 no AD with min guard assist    Time --    Period --    Status Achieved      PT LONG TERM GOAL #5   Title Pt will improve gait speed to >/= 2.0 ft/sec to demo improved community mobility.    Baseline 01/18/22: 2.29 ft/sec with cane    Time --    Period --    Status Achieved      PT LONG TERM GOAL #6   Title Patient will be able to ambulate >/= 500 ft with LRAD on level surfaces with supervision to demo improved household/community mobility    Baseline 01/16/22: met with RW at supervision level    Time --    Period --    Status Achieved      PT LONG TERM GOAL #7   Title Patient will imrpove FOTO to >/= 65%    Baseline 48%    Time 8    Period Weeks    Status On-going            Updated Goals: SHORT TERM GOALS = LONG TERM GOALS   LONG TERM GOALS:  Patient will be indepenent with initial HEP for strength/balance and completion of daily walking program Baseline: 01/18/22 reports performing current HEP with spouse assistance as needed and no issues. Will benefit from updating as pt progresses.  Target date:  02/23/22 Goal status: REVISED  2.  Patient will improve 5x  sit <> stand to </= 11 seconds with UE support  Baseline: 01/18/22: 13.0 sec's no UE support from standard height surface  Target date:  02/23/22 Goal status: REVISED  3.  Patient will improve TUG to </= 10 seconds with LRAD to demo reduced fall risk  Baseline: 01/18/22: 13.62 sec's with cane with supervision, 12.50 no AD with min guard assist  Target date:  02/23/22 Goal status: REVISED  4.  Pt will improve gait speed to >/= 2.62 ft/sec to demo improved community mobility.  Baseline: 01/18/22: 2.29 ft/sec with cane  Target date:  02/23/22 Goal status: REVISED  5.  DGI TBA and LTG to be set as applicable Baseline: TBA Target date:   02/23/22 Goal status: INITIAL  6.  Patient will improve FOTO to >/= 65%  Baseline: 48% Target date:  02/23/22 Goal status: IN PROGRESS     Plan - 01/18/22 1022     Clinical Impression Statement Today's skilled session focused on progress toward LTGs for progress note/recert by primary PT. Pt has shown improvment with Berg Balance test scoring 46/56, TUG scoring 13.62 sec's with cane, 10 meter gait speed with score of 2.29 ft/sec and 5 time sit to stand with score of 13.0 sec's with no UE support, meeting all these goals. Pt's goal is to walk with cane everywhere vs no AD. At this time pt still is min guad to min assist for gait with challenges and unlevel surfaces. [facility] PT plans to recert to continue to address balance deficits, strengthening and gait. The pt should benefit from continued PT to progress toward unmet goals.    Personal Factors and Comorbidities Comorbidity 3+;Transportation    Comorbidities TB, fatty liver, calcification of aorta, HA, HTN, remote h/o cocaine abuse, ETOH abuse, GERD, Hypothyroidism    Examination-Activity Limitations Transfers;Locomotion Level;Stairs;Stand    Examination-Participation Restrictions Cleaning;Community Activity;Driving    Stability/Clinical Decision Making Evolving/Moderate complexity    Rehab Potential Good    PT Frequency 2x / week    PT Duration 4 weeks    PT Treatment/Interventions ADLs/Self Care Home Management;Aquatic Therapy;Cryotherapy;Moist Heat;DME Instruction;Gait training;Stair training;Functional mobility training;Therapeutic activities;Neuromuscular re-education;Balance training;Therapeutic exercise;Patient/family education;Orthotic Fit/Training;Manual techniques;Passive range of motion    PT Next Visit Plan continue to work on gait with straight cane with quad tip vs no AD; continue to work on LE strengthening and balance training. ? DGI vs FGA    PT Home Exercise Plan Access Code: [email or web address]    Consulted and Agree with Plan of Care  Patient            Addendum: Charlotte Sanes, PT, DPT  Holly Phelps, PTA, [facility] [address] [place], [place] [address] [phone] 01/19/22, 11:25 AM

## 2022-01-18 NOTE — Progress Notes (Deleted)
OUTPATIENT PHYSICAL THERAPY TREATMENT NOTE   Patient Name: Holly Phelps MRN: [id number] DOB:1961/04/17, 61 y.o., female Today's Date: 01/18/2022  PCP: Maximiano Coss, NP REFERRING PROVIDER: Aline August, MD    Past Medical History:  Diagnosis Date   Allergy    Anemia    Calcification of aorta ([place])    Fatty liver    GERD (gastroesophageal reflux disease)    Headache    Hypertension    Hypothyroidism    Tuberculosis    as baby   Past Surgical History:  Procedure Laterality Date   BACK SURGERY     x 2   COLONOSCOPY N/A 03/13/2013   Procedure: COLONOSCOPY;  Surgeon: Leighton Ruff, MD;  Location: [facility];  Service: Endoscopy;  Laterality: N/A;   LAPAROSCOPIC APPENDECTOMY     Patient Active Problem List   Diagnosis Date Noted   Stroke ([place]) 11/09/2021   Essential hypertension 11/09/2021   Noncompliance with medication regimen 11/09/2021   Cerebral brain hemorrhage ([place]) 11/09/2021   Gouty arthritis 10/26/2020   Need for hepatitis B vaccination 01/12/2016   Calcification of aorta ([facility])    Recurrent Clostridium difficile diarrhea 11/18/2015   Macrocytic anemia 11/18/2015   Nausea and vomiting 11/18/2015   Leucocytosis 11/17/2015   Protein-calorie malnutrition, severe 11/17/2015   Hypomagnesemia 09/08/2015   Hypokalemia 09/07/2015   Tobacco use disorder 09/07/2015   Cocaine abuse ([place]) 05/27/2015   Anemia 01/29/2013   Nausea vomiting and diarrhea 01/26/2013   Esophagitis 01/26/2013   Anxiety 03/01/2008   Alcohol abuse 03/01/2008   Elevated LFTs 03/01/2008    REFERRING DIAG: I63.9 (ICD-10-CM) - Cerebrovascular accident (CVA), unspecified mechanism ([place])  THERAPY DIAG:  No diagnosis found.  PERTINENT HISTORY: TB, fatty liver, calcification of aorta, HA, HTN, remote h/o cocaine abuse, ETOH abuse, GERD, Hypothyroidism   PRECAUTIONS: Fall  SUBJECTIVE: ***  PAIN:  Are you having pain? {yes/no:20286} NPRS scale: ***/10 Pain location: *** Pain  orientation: {Pain Orientation:25161}  PAIN TYPE: {type:313116} Pain description: {PAIN DESCRIPTION:21022940}  Aggravating factors: *** Relieving factors: ***    TODAY'S TREATMENT:  ***   PATIENT EDUCATION: Education details: Continue HEP Person educated: Patient Education method: Explanation Education comprehension: verbalized understanding   HOME EXERCISE PROGRAM: Access Code: [email or web address]   PT Short Term Goals -       PT SHORT TERM GOAL #1   Title Patient will be indepenent with initial HEP for strength/balance (ALL STGs Due: 12/29/21)    Baseline 27/23: reports no issues with them, still challenging.    Status Achieved      PT SHORT TERM GOAL #2   Title Patient will improve Berg Balance to >/= 40/56 to demo improved balance and reduced fall risk    Baseline 01/09/22: 41/56 scored today    Status Achieved      PT SHORT TERM GOAL #3   Title Patient will improve 5x sit <> stand to </= 18 seconds with UE support    Baseline 12/26/21: 16.50 sec's with UE support from standard height chair    Status Achieved      PT SHORT TERM GOAL #4   Title Patient will improve TUG to </= 30 seconds with LRAD to demo reduced fall risk    Baseline 12/26/21: 23.28 sec's with RW, then 16.65 sec's no device with min guard assist    Status Achieved      PT SHORT TERM GOAL #5   Title Pt will improve gait speed to >/= 1.6 ft/sec to demo improved community mobility.  Baseline 12/26/21: 1.73 ft/sec with RW Mod I, then 1.83 ft/sec no AD with min guard assist    Status Achieved              PT Long Term Goals       PT LONG TERM GOAL #1   Title Patient will be indepenent with initial HEP for strength/balance (ALL LTGs Due: 01/26/22)    Baseline no HEP established    Time 8    Period Weeks    Status New    Target Date 01/26/22      PT LONG TERM GOAL #2   Title Patient will improve Berg Balance to >/= 45/56 to demo improved balance and reduced fall risk    Baseline 35/56    Time 8     Period Weeks    Status New      PT LONG TERM GOAL #3   Title Patient will improve 5x sit <> stand to </= 15 seconds with UE support    Baseline 21.94 secs    Time 8    Period Weeks    Status New      PT LONG TERM GOAL #4   Title Patient will improve TUG to </= 20 seconds with LRAD to demo reduced fall risk    Baseline 36.72 secs    Time 8    Period Weeks    Status New      PT LONG TERM GOAL #5   Title Pt will improve gait speed to >/= 2.0 ft/sec to demo improved community mobility.    Baseline 1.21 ft/sec    Time 8    Period Weeks    Status New      Additional Long Term Goals   Additional Long Term Goals Yes      PT LONG TERM GOAL #6   Title Patient will be able to ambulate >/= 500 ft with LRAD on level surfaces with supervision to demo improved household/community mobility    Baseline 75 ft RW CGA    Time 8    Period Weeks    Status New      PT LONG TERM GOAL #7   Title Patient will imrpove FOTO to >/= 65%    Baseline 48%    Time 8    Period Weeks    Status New                 Jones Bales, PT, DPT 01/18/2022, 8:30 AM

## 2022-01-19 NOTE — Addendum Note (Signed)
Addended by: Baldomero Lamy B on: 01/19/2022 11:27 AM   Modules accepted: Orders

## 2022-01-23 ENCOUNTER — Ambulatory Visit: Payer: 59 | Admitting: Neurology

## 2022-01-23 ENCOUNTER — Encounter: Payer: Self-pay | Admitting: Neurology

## 2022-01-23 ENCOUNTER — Ambulatory Visit: Payer: 59

## 2022-01-23 VITALS — BP 156/89 | HR 101 | Ht 62.0 in | Wt 102.5 lb

## 2022-01-23 DIAGNOSIS — I6389 Other cerebral infarction: Secondary | ICD-10-CM | POA: Diagnosis not present

## 2022-01-23 DIAGNOSIS — D1802 Hemangioma of intracranial structures: Secondary | ICD-10-CM

## 2022-01-23 DIAGNOSIS — H532 Diplopia: Secondary | ICD-10-CM

## 2022-01-23 NOTE — Progress Notes (Signed)
[facility] [address] [place]. [place] [address] [phone]       OFFICE CONSULT NOTE  Holly Phelps Date of Birth:  04/05/1961 Medical Record Number:  [id number]   Referring MD:  Maryelizabeth Rowan  Reason for Referral: Stroke  HPI: Holly Phelps is a 61 year old Caucasian lady seen today for initial office consultation visit for stroke.  History is obtained from the patient and review of electronic medical records and I personally reviewed available pertinent imaging films in PACS.  She has past medical history of hypertension, hyperlipidemia, hypothyroidism, remote history of tuberculosis, alcohol abuse, cocaine and marijuana abuse, GERD protein calorie malnutrition.  She presented on 11/09/2021 with sudden onset of waking up with double vision 3 days ago.  She denies any eye pain.  There is no headache, blurred vision, vertigo or gait or balance problems.  The diplopia was binocular and mostly when she looks to the left.  She was found to have a left eye exotropia with left INO on exam.  CT head without contrast showed no acute abnormality and only chronic changes of microvascular disease and generalized cerebral atrophy.  MRI scan of the orbits showed no acute abnormality and MRI scan of the brain shows advanced generalized cerebral atrophy and ventricular enlargement consistent with degree of atrophy.  There is no acute infarct but extensive changes of chronic small vessel disease and white matter as well as in the central pons.  There was multiple areas of chronic microhemorrhages in the pons bilaterally and there is area of abnormal enhancing vein left parietal lobe compatible with developmental venous anomaly.  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  MR angiogram of the brain and neck showed no significant large vessel stenosis or occlusion except mild atheromatous irregularity of bilateral M2  middle cerebral arteries and distal MCA vessels.  Echocardiogram showed normal ejection fraction 55 to 60% without cardiac source of embolism.  LDL cholesterol elevated at 126 and hemoglobin A1c was 4.4.  Urine drug screen was positive for cocaine and marijuana.  Patient was started on dual antiplatelet therapy aspirin Plavix for 3 weeks followed by aspirin alone.  She states she is doing well her diplopia is improving but not completely gone.  She still has diplopia on extreme left lateral gaze now though left eye movement seems to have improved.  She is finishing outpatient physical therapy and can walk short distances independently but uses a walker for long distances.  She has quit using cocaine and marijuana completely and now plans to quit smoking cigarettes soon.  She is tolerating aspirin well without bruising or bleeding and Lipitor without muscle aches and pains.  She has not been keeping track of her blood pressure but her blood pressure elevated today in office at 156/89.  She plans to see a primary care physician next week to address her blood pressure.  She denies any prior history of strokes TIAs or significant neurological problems.  She does have an upcoming appointment to see optometrist and I advised her to discuss trying small tape patch over her glasses to see if it can correct for her double vision  ROS:   14 system review of systems is positive for double vision, diplopia, gait imbalance all other systems negative  PMH:  Past Medical History:  Diagnosis Date   Allergy    Anemia    Calcification of aorta ([facility])    Fatty liver  GERD (gastroesophageal reflux disease)    Headache    Hypertension    Hypothyroidism    Tuberculosis    as baby    Social History:  Social History   Socioeconomic History   Marital status: Married    Spouse name: [name]   Number of children: 0   Years of education: [employer] education level: Not on file  Occupational History    Occupation: retired Teaching laboratory technician work  Tobacco Use   Smoking status: Every Day    Packs/day: 0.20    Types: Cigarettes   Smokeless tobacco: Never   Tobacco comments:    3 cigarettes per day  Substance and Sexual Activity   Alcohol use: Yes    Alcohol/week: 10.0 standard drinks    Types: 10 Glasses of wine per week    Comment: working to reduce this due to illness   Drug use: Yes    Types: Cocaine    Comment: rarely    Sexual activity: Not Currently  Other Topics Concern   Not on file  Social History Narrative   Lives with her husband.   No children.   Social Determinants of Health   Financial Resource Strain: Not on file  Food Insecurity: Not on file  Transportation Needs: Not on file  Physical Activity: Not on file  Stress: Not on file  Social Connections: Not on file  Intimate Partner Violence: Not on file    Medications:   Current Outpatient Medications on File Prior to Visit  Medication Sig Dispense Refill   acetaminophen (TYLENOL) 500 MG tablet Take 1,000 mg by mouth every 6 (six) hours as needed.     aspirin 81 MG chewable tablet Chew 1 tablet (81 mg total) by mouth daily. 30 tablet 0   atorvastatin (LIPITOR) 40 MG tablet Take 1 tablet (40 mg total) by mouth daily. 30 tablet 0   COVID-19 mRNA bivalent vaccine, Pfizer, (PFIZER COVID-19 VAC BIVALENT) injection Inject into the muscle. 0.3 mL 0   famotidine (PEPCID) 20 MG tablet Take 1 tablet (20 mg total) by mouth daily as needed for heartburn.     folic acid (FOLVITE) 1 MG tablet Take 1 tablet (1 mg total) by mouth daily. 30 tablet 0   Multiple Vitamins-Minerals (MULTIVITAMIN ADULTS PO) Take 1 tablet by mouth daily.     Polyethylene Glycol 400 (VISINE DRY EYE RELIEF) 1 % SOLN Place 1 drop into both eyes daily as needed (dry eyes).     thiamine 100 MG tablet Take 1 tablet (100 mg total) by mouth daily. 30 tablet 0   allopurinol (ZYLOPRIM) 100 MG tablet Take 1 tablet (100 mg total) by mouth daily. (Patient not taking:  Reported on 11/09/2021) 90 tablet 3   No current facility-administered medications on file prior to visit.    Allergies:   Allergies  Allergen Reactions   Codeine Nausea And Vomiting   Flexeril [Cyclobenzaprine] Itching   Sulfa Antibiotics     Pt does not recall what the reaction was,been too long   Tramadol     Other reaction(s): nausea   Synthroid [Levothyroxine] Rash    Physical Exam General: Frail middle-aged Caucasian lady, seated, in no evident distress Head: head normocephalic and atraumatic.   Neck: supple with no carotid or supraclavicular bruits Cardiovascular: regular rate and rhythm, no murmurs Musculoskeletal: no deformity Skin:  no rash/petichiae Vascular:  Normal pulses all extremities  Neurologic Exam Mental Status: Awake and fully alert. Oriented to place and time. Recent and remote  memory intact. Attention span, concentration and fund of knowledge appropriate. Mood and affect appropriate.  Cranial Nerves: Fundoscopic exam reveals sharp disc margins. Pupils equal, briskly reactive to light. Extraocular movements full without nystagmus but has mild subjective diplopia on left lateral gaze.. Visual fields full to confrontation. Hearing intact. Facial sensation intact. Face, tongue, palate moves normally and symmetrically.  Motor: Normal bulk and tone. Normal strength in all tested extremity muscles. Sensory.: intact to touch , pinprick , position and vibratory sensation.  Coordination: Rapid alternating movements normal in all extremities. Finger-to-nose and heel-to-shin performed accurately bilaterally. Gait and Station: Arises from chair without difficulty. Stance is normal. Gait using a walker demonstrates normal stride length and slight imbalance .  Tandem walking not tested reflexes: 1+ and symmetric. Toes downgoing.   NIHSS  0 Modified Rankin  2   ASSESSMENT: 61 year old Caucasian lady with sudden onset of painless horizontal diplopia in December 2022 likely  due to small pontine lesion unclear related to thetelengiectasia that is seen on MRI or a small infarct not visualized on MRI.  Fortunately she seems to be improving.  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  Fortunately her symptoms seem to be improving and so recommend conservative follow-up for now but aggressive risk factor modification and continue aspirin for stroke prevention and strict blood pressure control with goal below 130/90, lipids with LDL cholesterol goal below 70 mg percent and diabetes with hemoglobin A1c goal below 6.5%.  I strongly counseled the patient to quit smoking completely and complemented her on having quit cocaine and marijuana already.  Continue outpatient physical therapy and talk to her optometrist to put a small patch on her glasses to correct her diplopia.  She will return for follow-up in the future in 3 months with my nurse practitioner call earlier if necessary..  Greater than 50% time during this 45-minute consultation visit was spent in counseling and coordination of care about diplopia and brainstem lesion and discussion about stroke prevention and treatment and answering questions Antony Contras, MD  Note: This document was prepared with digital dictation and possible smart phrase technology. Any transcriptional errors that result from this process are unintentional.

## 2022-01-23 NOTE — Patient Instructions (Signed)
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  Fortunately her symptoms seem to be improving and so recommend conservative follow-up for now but aggressive risk factor modification and continue aspirin for stroke prevention and strict blood pressure control with goal below 130/90, lipids with LDL cholesterol goal below 70 mg percent and diabetes with hemoglobin A1c goal below 6.5%.  I strongly counseled the patient to quit smoking completely and complemented her on having quit cocaine and marijuana already.  Continue outpatient physical therapy and talk to her optometrist to put a small patch on her glasses to correct her diplopia.  She will return for follow-up in the future in 3 months with my nurse practitioner call earlier if necessary.. Stroke Prevention Some medical conditions and behaviors can lead to a higher chance of having a stroke. You can help prevent a stroke by eating healthy, exercising, not smoking, and managing any medical conditions you have. Stroke is a leading cause of functional impairment. Primary prevention is particularly important because a majority of strokes are first-time events. Stroke changes the lives of not only those who experience a stroke but also their family and other caregivers. How can this condition affect me? A stroke is a medical emergency and should be treated right away. A stroke can lead to brain damage and can sometimes be life-threatening. If a person gets medical treatment right away, there is a better chance of surviving and recovering from a stroke. What can increase my risk? The following medical conditions may increase your risk of a stroke: Cardiovascular disease. High blood pressure (hypertension). Diabetes. High  cholesterol. Sickle cell disease. Blood clotting disorders (hypercoagulable state). Obesity. Sleep disorders (obstructive sleep apnea). Other risk factors include: Being older than age 46. Having a history of blood clots, stroke, or mini-stroke (transient ischemic attack, TIA). Genetic factors, such as race, ethnicity, or a family history of stroke. Smoking cigarettes or using other tobacco products. Taking birth control pills, especially if you also use tobacco. Heavy use of alcohol or drugs, especially cocaine and methamphetamine. Physical inactivity. What actions can I take to prevent this? Manage your health conditions High cholesterol levels. Eating a healthy diet is important for preventing high cholesterol. If cholesterol cannot be managed through diet alone, you may need to take medicines. Take any prescribed medicines to control your cholesterol as told by your health care provider. Hypertension. To reduce your risk of stroke, try to keep your blood pressure below 130/80. Eating a healthy diet and exercising regularly are important for controlling blood pressure. If these steps are not enough to manage your blood pressure, you may need to take medicines. Take any prescribed medicines to control hypertension as told by your health care provider. Ask your health care provider if you should monitor your blood pressure at home. Have your blood pressure checked every year, even if your blood pressure is normal. Blood pressure increases with age and some medical conditions. Diabetes. Eating a healthy diet and exercising regularly are important parts of managing your blood sugar (glucose). If your blood sugar cannot be managed through diet and exercise, you may need to take medicines. Take any prescribed medicines to control your diabetes as told by your health care provider. Get evaluated for obstructive sleep apnea. Talk to your health  care provider about getting a sleep evaluation if  you snore a lot or have excessive sleepiness. Make sure that any other medical conditions you have, such as atrial fibrillation or atherosclerosis, are managed. Nutrition Follow instructions from your health care provider about what to eat or drink to help manage your health condition. These instructions may include: Reducing your daily calorie intake. Limiting how much salt (sodium) you use to 1,500 milligrams (mg) each day. Using only healthy fats for cooking, such as olive oil, canola oil, or sunflower oil. Eating healthy foods. You can do this by: Choosing foods that are high in fiber, such as whole grains, and fresh fruits and vegetables. Eating at least 5 servings of fruits and vegetables a day. Try to fill one-half of your plate with fruits and vegetables at each meal. Choosing lean protein foods, such as lean cuts of meat, poultry without skin, fish, tofu, beans, and nuts. Eating low-fat dairy products. Avoiding foods that are high in sodium. This can help lower blood pressure. Avoiding foods that have saturated fat, trans fat, and cholesterol. This can help prevent high cholesterol. Avoiding processed and prepared foods. Counting your daily carbohydrate intake.  Lifestyle If you drink alcohol: Limit how much you have to: 0-1 drink a day for women who are not pregnant. 0-2 drinks a day for men. Know how much alcohol is in your drink. In the [place] one drink equals one 12 oz bottle of beer (379m), one 5 oz glass of wine (141m, or one 1 oz glass of hard liquor (4436m Do not use any products that contain nicotine or tobacco. These products include cigarettes, chewing tobacco, and vaping devices, such as e-cigarettes. If you need help quitting, ask your health care provider. Avoid secondhand smoke. Do not use drugs. Activity  Try to stay at a healthy weight. Get at least 30 minutes of exercise on most days, such as: Fast walking. Biking. Swimming. Medicines Take  over-the-counter and prescription medicines only as told by your health care provider. Aspirin or blood thinners (antiplatelets or anticoagulants) may be recommended to reduce your risk of forming blood clots that can lead to stroke. Avoid taking birth control pills. Talk to your health care provider about the risks of taking birth control pills if: You are over 35 75ars old. You smoke. You get very bad headaches. You have had a blood clot. Where to find more information American Stroke Association: www.strokeassociation.org Get help right away if: You or a loved one has any symptoms of a stroke. "BE FAST" is an easy way to remember the main warning signs of a stroke: B - Balance. Signs are dizziness, sudden trouble walking, or loss of balance. E - Eyes. Signs are trouble seeing or a sudden change in vision. F - Face. Signs are sudden weakness or numbness of the face, or the face or eyelid drooping on one side. A - Arms. Signs are weakness or numbness in an arm. This happens suddenly and usually on one side of the body. S - Speech. Signs are sudden trouble speaking, slurred speech, or trouble understanding what people say. T - Time. Time to call emergency services. Write down what time symptoms started. You or a loved one has other signs of a stroke, such as: A sudden, severe headache with no known cause. Nausea or vomiting. Seizure. These symptoms may represent a serious problem that is an emergency. Do not wait to see if the symptoms will go away. Get medical help right away. Call your  local emergency services (911 in the [place] Do not drive yourself to the hospital. Summary You can help to prevent a stroke by eating healthy, exercising, not smoking, limiting alcohol intake, and managing any medical conditions you may have. Do not use any products that contain nicotine or tobacco. These include cigarettes, chewing tobacco, and vaping devices, such as e-cigarettes. If you need help quitting,  ask your health care provider. Remember "BE FAST" for warning signs of a stroke. Get help right away if you or a loved one has any of these signs. This information is not intended to replace advice given to you by your health care provider. Make sure you discuss any questions you have with your health care provider. Document Revised: 06/06/2020 Document Reviewed: 06/06/2020 Elsevier Patient Education  Page.

## 2022-01-25 ENCOUNTER — Ambulatory Visit: Payer: 59 | Admitting: Physical Therapy

## 2022-01-30 ENCOUNTER — Ambulatory Visit: Payer: 59

## 2022-01-30 ENCOUNTER — Other Ambulatory Visit: Payer: Self-pay

## 2022-01-30 DIAGNOSIS — R2689 Other abnormalities of gait and mobility: Secondary | ICD-10-CM | POA: Diagnosis not present

## 2022-01-30 DIAGNOSIS — R262 Difficulty in walking, not elsewhere classified: Secondary | ICD-10-CM | POA: Diagnosis not present

## 2022-01-30 DIAGNOSIS — R2681 Unsteadiness on feet: Secondary | ICD-10-CM

## 2022-01-30 DIAGNOSIS — M6281 Muscle weakness (generalized): Secondary | ICD-10-CM | POA: Diagnosis not present

## 2022-01-30 NOTE — Therapy (Signed)
OUTPATIENT PHYSICAL THERAPY TREATMENT NOTE   Patient Name: Holly Phelps MRN: [id number] DOB:05/15/1961, 61 y.o., female Today's Date: 01/30/2022   PCP: Collene Leyden, MD REFERRING PROVIDER: Aline August, MD    PT End of Session - 01/30/22 1015     Visit Number 11    Number of Visits 17    Date for PT Re-Evaluation 02/23/22    Authorization Type Aet[employer] (VL: 25)    PT Start Time 1015    PT Stop Time 1057    PT Time Calculation (min) 42 min    Equipment Utilized During Treatment Gait belt    Activity Tolerance Patient tolerated treatment well    Behavior During Therapy WFL for tasks assessed/performed             Past Medical History:  Diagnosis Date   Allergy    Anemia    Calcification of aorta ([place])    Fatty liver    GERD (gastroesophageal reflux disease)    Headache    Hypertension    Hypothyroidism    Tuberculosis    as baby   Past Surgical History:  Procedure Laterality Date   BACK SURGERY     x 2   COLONOSCOPY N/A 03/13/2013   Procedure: COLONOSCOPY;  Surgeon: Leighton Ruff, MD;  Location: [facility];  Service: Endoscopy;  Laterality: N/A;   LAPAROSCOPIC APPENDECTOMY     Patient Active Problem List   Diagnosis Date Noted   Stroke ([place]) 11/09/2021   Essential hypertension 11/09/2021   Noncompliance with medication regimen 11/09/2021   Cerebral brain hemorrhage ([place]) 11/09/2021   Gouty arthritis 10/26/2020   Need for hepatitis B vaccination 01/12/2016   Calcification of aorta ([facility])    Recurrent Clostridium difficile diarrhea 11/18/2015   Macrocytic anemia 11/18/2015   Nausea and vomiting 11/18/2015   Leucocytosis 11/17/2015   Protein-calorie malnutrition, severe 11/17/2015   Hypomagnesemia 09/08/2015   Hypokalemia 09/07/2015   Tobacco use disorder 09/07/2015   Cocaine abuse ([place]) 05/27/2015   Anemia 01/29/2013   Nausea vomiting and diarrhea 01/26/2013   Esophagitis 01/26/2013   Anxiety 03/01/2008   Alcohol abuse 03/01/2008   Elevated LFTs  03/01/2008    REFERRING DIAG: CVA   THERAPY DIAG:  Other abnormalities of gait and mobility  Difficulty in walking, not elsewhere classified  Unsteadiness on feet  Muscle weakness (generalized)  PERTINENT HISTORY: TB, fatty liver, calcification of aorta, HA, HTN, remote h/o cocaine abuse, ETOH abuse, GERD, Hypothyroidism   PRECAUTIONS: Fall   SUBJECTIVE: Feels like the time change has messed her up. No new changes/complaints. No falls.   PAIN:  Are you having pain? Yes NPRS scale: 5/10 Pain location: generalized Pain orientation: Other: Generalized   PAIN TYPE: Chronic Pain description: aching  Aggravating factors: weather Relieving factors: resting    TODAY'S TREATMENT:  TherEx:  Completed NuStep on Level 3.0 with BUE/BLE's x 5 minutes. Cues to maintain steps per minute >/= 50 for improved activity/endurance, mod-max encouragement required for additional time.  On level surface, completed sit <> stands without UE support from mat x 10 reps, cues for forward lean.  Standing alternating march with single UE support with 3# ankle weights on BLE, completed 2 sets x 10 reps.  Standing heel raises with 3# ankle weights on BLE, completed 2 sets x 10 reps with single UE support required.     GAIT: Gait pattern: decreased step length- Right, decreased step length- Left, poor foot clearance- Right, and poor foot clearance- Left Distance walked: throughout clinic  with activities (clinic distance) Assistive device utilized: Environmental consultant - 2 wheeled and None Level of assistance: SBA Comments: continue to ambulate into session with use of RW, without AD throughout session  NMR:  [facility] PT Assessment - 01/30/22 0001       Standardized Balance Assessment   Standardized Balance Assessment Dynamic Gait Index      Dynamic Gait Index   Level Surface Moderate Impairment    Change in Gait Speed Moderate Impairment    Gait with Horizontal Head Turns Moderate Impairment    Gait with  Vertical Head Turns Mild Impairment    Gait and Pivot Turn Moderate Impairment    Step Over Obstacle Moderate Impairment    Step Around Obstacles Moderate Impairment    Steps Moderate Impairment    Total Score 9    DGI comment: 9/24              PATIENT EDUCATION: Education details: Continue HEP; DGI Results Person educated: Patient Education method: Explanation, Demonstration, and Verbal cues Education comprehension: verbalized understanding, returned demonstration, verbal cues required, and needs further education   HOME EXERCISE PROGRAM: Access Code: [email or web address]   Updated Goals: SHORT TERM GOALS = LONG TERM GOALS   LONG TERM GOALS:  Patient will be indepenent with initial HEP for strength/balance and completion of daily walking program Baseline: 01/18/22 reports performing current HEP with spouse assistance as needed and no issues. Will benefit from updating as pt progresses.  Target date:  02/23/22 Goal status: REVISED  2.  Patient will improve 5x sit <> stand to </= 11 seconds with UE support  Baseline: 01/18/22: 13.0 sec's no UE support from standard height surface  Target date:  02/23/22 Goal status: REVISED  3.  Patient will improve TUG to </= 10 seconds with LRAD to demo reduced fall risk  Baseline: 01/18/22: 13.62 sec's with cane with supervision, 12.50 no AD with min guard assist  Target date:  02/23/22 Goal status: REVISED  4.  Pt will improve gait speed to >/= 2.62 ft/sec to demo improved community mobility.  Baseline: 01/18/22: 2.29 ft/sec with cane  Target date:  02/23/22 Goal status: REVISED  5.  Pt will improve DGI to >/= 15/24 to demonstrate improved balance and reduced fall risk  Baseline: TBA Target date:  02/23/22 Goal status: INITIAL  6.  Patient will improve FOTO to >/= 65%  Baseline: 48% Target date:  02/23/22 Goal status: IN PROGRESS     Plan - 01/18/22 1022     Clinical Impression Statement Assessed balance with DGI today, with patient scoring  9/24 indicating high fall risk. Updated goals. Continued strengthening of BLE to patient's tolerance requiring intermittent rest breaks due to fatigue. Will continue per POC.    Personal Factors and Comorbidities Comorbidity 3+;Transportation    Comorbidities TB, fatty liver, calcification of aorta, HA, HTN, remote h/o cocaine abuse, ETOH abuse, GERD, Hypothyroidism    Examination-Activity Limitations Transfers;Locomotion Level;Stairs;Stand    Examination-Participation Restrictions Cleaning;Community Activity;Driving    Stability/Clinical Decision Making Evolving/Moderate complexity    Rehab Potential Good    PT Frequency 2x / week    PT Duration 4 weeks    PT Treatment/Interventions ADLs/Self Care Home Management;Aquatic Therapy;Cryotherapy;Moist Heat;DME Instruction;Gait training;Stair training;Functional mobility training;Therapeutic activities;Neuromuscular re-education;Balance training;Therapeutic exercise;Patient/family education;Orthotic Fit/Training;Manual techniques;Passive range of motion    PT Next Visit Plan continue to work on gait with straight cane with quad tip vs no AD; continue to work on LE strengthening and balance training. Negotiation over obstacles and SLS activities.  PT Home Exercise Plan Access Code: [email or web address]    Consulted and Agree with Plan of Care Patient             Jones Bales, PT, DPT 01/30/22, 10:57 AM

## 2022-02-02 ENCOUNTER — Ambulatory Visit: Payer: 59

## 2022-02-02 ENCOUNTER — Other Ambulatory Visit: Payer: Self-pay

## 2022-02-02 DIAGNOSIS — M6281 Muscle weakness (generalized): Secondary | ICD-10-CM | POA: Diagnosis not present

## 2022-02-02 DIAGNOSIS — R2681 Unsteadiness on feet: Secondary | ICD-10-CM

## 2022-02-02 DIAGNOSIS — R2689 Other abnormalities of gait and mobility: Secondary | ICD-10-CM

## 2022-02-02 DIAGNOSIS — R262 Difficulty in walking, not elsewhere classified: Secondary | ICD-10-CM

## 2022-02-02 NOTE — Therapy (Signed)
OUTPATIENT PHYSICAL THERAPY TREATMENT NOTE   Patient Name: Holly Phelps MRN: [id number] DOB:[birth year], 61 y.o., female Today's Date: 02/02/2022   PCP: Collene Leyden, MD REFERRING PROVIDER: Aline August, MD    PT End of Session - 02/02/22 1019     Visit Number 12    Number of Visits 17    Date for PT Re-Evaluation 02/23/22    Authorization Type Aetna (VL: 61)    PT Start Time 1017    PT Stop Time 1040    PT Time Calculation (min) 23 min    Equipment Utilized During Treatment Gait belt    Activity Tolerance Patient tolerated treatment well    Behavior During Therapy WFL for tasks assessed/performed             Past Medical History:  Diagnosis Date   Allergy    Anemia    Calcification of aorta ([facility])    Fatty liver    GERD (gastroesophageal reflux disease)    Headache    Hypertension    Hypothyroidism    Tuberculosis    as baby   Past Surgical History:  Procedure Laterality Date   BACK SURGERY     x 2   COLONOSCOPY N/A 03/13/2013   Procedure: COLONOSCOPY;  Surgeon: Leighton Ruff, MD;  Location: [facility];  Service: Endoscopy;  Laterality: N/A;   LAPAROSCOPIC APPENDECTOMY     Patient Active Problem List   Diagnosis Date Noted   Stroke ([place]) 11/09/2021   Essential hypertension 11/09/2021   Noncompliance with medication regimen 11/09/2021   Cerebral brain hemorrhage ([place]) 11/09/2021   Gouty arthritis 10/26/2020   Need for hepatitis B vaccination 01/12/2016   Calcification of aorta ([facility])    Recurrent Clostridium difficile diarrhea 11/18/2015   Macrocytic anemia 11/18/2015   Nausea and vomiting 11/18/2015   Leucocytosis 11/17/2015   Protein-calorie malnutrition, severe 11/17/2015   Hypomagnesemia 09/08/2015   Hypokalemia 09/07/2015   Tobacco use disorder 09/07/2015   Cocaine abuse ([place]) 05/27/2015   Anemia 01/29/2013   Nausea vomiting and diarrhea 01/26/2013   Esophagitis 01/26/2013   Anxiety 03/01/2008   Alcohol abuse 03/01/2008   Elevated LFTs  03/01/2008    REFERRING DIAG: CVA   THERAPY DIAG:  Other abnormalities of gait and mobility  Difficulty in walking, not elsewhere classified  Unsteadiness on feet  Muscle weakness (generalized)  PERTINENT HISTORY: TB, fatty liver, calcification of aorta, HA, HTN, remote h/o cocaine abuse, ETOH abuse, GERD, Hypothyroidism   PRECAUTIONS: Fall   SUBJECTIVE: I am not feeling good today because of the weather, I may cut the session short. Have been up since 6 am due to the cat waking her up. I'm aching all over.     PAIN:  Are you having pain? Yes NPRS scale: 8/10 Pain location: generalized Pain orientation: Other: Generalized   PAIN TYPE: Chronic Pain description: aching  Aggravating factors: weather Relieving factors: resting    TODAY'S TREATMENT:   GAIT: Gait pattern: decreased step length- Right, decreased step length- Left, poor foot clearance- Right, and poor foot clearance- Left Distance walked: 230 ft (prior to needing rest break)  Assistive device utilized: Walker - 2 wheeled and None Level of assistance: SBA Comments: continue ambulation without AD, rest break required due to fatigue. Max encouragement required for completion of second lap today.       NMR:  Obstacle Negotiation: with orange hurdles and black balance beams completed ambulation forward with obstacle negotiation, able to complete x 2 laps down and back with  HHA required due to patient fearful. Patient reporting increased fatigue and tearful due to pain. BP upon assessment: 169/97, HR: 102. Completed seated rest break and reassessed with improvements noted: BP 158/88, HR: 95. Patient requesting to end session due to reports feeling bad.    PATIENT EDUCATION: Education details: Continue HEP Person educated: Patient Education method: Education officer, environmental, and Verbal cues Education comprehension: verbalized understanding, returned demonstration, verbal cues required, and needs further  education   HOME EXERCISE PROGRAM: Access Code: [email or web address]   Updated Goals: SHORT TERM GOALS = LONG TERM GOALS   LONG TERM GOALS:  Patient will be indepenent with initial HEP for strength/balance and completion of daily walking program Baseline: 01/18/22 reports performing current HEP with spouse assistance as needed and no issues. Will benefit from updating as pt progresses.  Target date:  02/23/22 Goal status: REVISED  2.  Patient will improve 5x sit <> stand to </= 11 seconds with UE support  Baseline: 01/18/22: 13.0 sec's no UE support from standard height surface  Target date:  02/23/22 Goal status: REVISED  3.  Patient will improve TUG to </= 10 seconds with LRAD to demo reduced fall risk  Baseline: 01/18/22: 13.62 sec's with cane with supervision, 12.50 no AD with min guard assist  Target date:  02/23/22 Goal status: REVISED  4.  Pt will improve gait speed to >/= 2.62 ft/sec to demo improved community mobility.  Baseline: 01/18/22: 2.29 ft/sec with cane  Target date:  02/23/22 Goal status: REVISED  5.  Pt will improve DGI to >/= 15/24 to demonstrate improved balance and reduced fall risk  Baseline: TBA Target date:  02/23/22 Goal status: INITIAL  6.  Patient will improve FOTO to >/= 65%  Baseline: 48% Target date:  02/23/22 Goal status: IN PROGRESS     Plan - 01/18/22 1022     Clinical Impression Statement Patient presented to PT session with reports of pain/fatigue. PT engaged patient in activity but patient tearful and requesting to end session with PT agreeable. PT slightly elevated after activity, but improved with rest break. Will continue per POC.    Personal Factors and Comorbidities Comorbidity 3+;Transportation    Comorbidities TB, fatty liver, calcification of aorta, HA, HTN, remote h/o cocaine abuse, ETOH abuse, GERD, Hypothyroidism    Examination-Activity Limitations Transfers;Locomotion Level;Stairs;Stand    Examination-Participation Restrictions Cleaning;Community  Activity;Driving    Stability/Clinical Decision Making Evolving/Moderate complexity    Rehab Potential Good    PT Frequency 2x / week    PT Duration 4 weeks    PT Treatment/Interventions ADLs/Self Care Home Management;Aquatic Therapy;Cryotherapy;Moist Heat;DME Instruction;Gait training;Stair training;Functional mobility training;Therapeutic activities;Neuromuscular re-education;Balance training;Therapeutic exercise;Patient/family education;Orthotic Fit/Training;Manual techniques;Passive range of motion    PT Next Visit Plan Monitor BP. continue to work on gait with straight cane with quad tip vs no AD; continue to work on LE strengthening and balance training. Negotiation over obstacles and SLS activities.    PT Home Exercise Plan Access Code: [email or web address]    Consulted and Agree with Plan of Care Patient             Jones Bales, PT, DPT 02/02/22, 10:44 AM

## 2022-02-05 DIAGNOSIS — D539 Nutritional anemia, unspecified: Secondary | ICD-10-CM | POA: Diagnosis not present

## 2022-02-05 DIAGNOSIS — E039 Hypothyroidism, unspecified: Secondary | ICD-10-CM | POA: Diagnosis not present

## 2022-02-05 DIAGNOSIS — I10 Essential (primary) hypertension: Secondary | ICD-10-CM | POA: Diagnosis not present

## 2022-02-05 DIAGNOSIS — R69 Illness, unspecified: Secondary | ICD-10-CM | POA: Diagnosis not present

## 2022-02-05 DIAGNOSIS — E785 Hyperlipidemia, unspecified: Secondary | ICD-10-CM | POA: Diagnosis not present

## 2022-02-05 DIAGNOSIS — R748 Abnormal levels of other serum enzymes: Secondary | ICD-10-CM | POA: Diagnosis not present

## 2022-02-06 ENCOUNTER — Other Ambulatory Visit: Payer: Self-pay | Admitting: Family Medicine

## 2022-02-06 ENCOUNTER — Ambulatory Visit: Payer: 59

## 2022-02-06 DIAGNOSIS — Z1231 Encounter for screening mammogram for malignant neoplasm of breast: Secondary | ICD-10-CM

## 2022-02-08 ENCOUNTER — Ambulatory Visit: Payer: 59 | Admitting: Physical Therapy

## 2022-02-12 DIAGNOSIS — H40013 Open angle with borderline findings, low risk, bilateral: Secondary | ICD-10-CM | POA: Diagnosis not present

## 2022-02-13 ENCOUNTER — Other Ambulatory Visit: Payer: Self-pay

## 2022-02-13 ENCOUNTER — Encounter: Payer: Self-pay | Admitting: Physical Therapy

## 2022-02-13 ENCOUNTER — Ambulatory Visit: Payer: 59 | Admitting: Physical Therapy

## 2022-02-13 VITALS — BP 126/85 | HR 111

## 2022-02-13 DIAGNOSIS — R262 Difficulty in walking, not elsewhere classified: Secondary | ICD-10-CM | POA: Diagnosis not present

## 2022-02-13 DIAGNOSIS — R2689 Other abnormalities of gait and mobility: Secondary | ICD-10-CM

## 2022-02-13 DIAGNOSIS — R2681 Unsteadiness on feet: Secondary | ICD-10-CM

## 2022-02-13 DIAGNOSIS — M6281 Muscle weakness (generalized): Secondary | ICD-10-CM | POA: Diagnosis not present

## 2022-02-13 NOTE — Therapy (Signed)
OUTPATIENT PHYSICAL THERAPY TREATMENT NOTE   Patient Name: Holly Phelps MRN: [id number] DOB:1961-01-12, 61 y.o., female Today's Date: 02/14/2022   PCP: Collene Leyden, MD REFERRING PROVIDER: Aline August, MD    PT End of Session - 02/13/22 1104     Visit Number 13    Number of Visits 17    Date for PT Re-Evaluation 02/23/22    Authorization Type Aetna (VL: 41)    PT Start Time 1102    PT Stop Time 1143    PT Time Calculation (min) 41 min    Equipment Utilized During Treatment Gait belt    Activity Tolerance Patient tolerated treatment well    Behavior During Therapy WFL for tasks assessed/performed             Past Medical History:  Diagnosis Date   Allergy    Anemia    Calcification of aorta ([place])    Fatty liver    GERD (gastroesophageal reflux disease)    Headache    Hypertension    Hypothyroidism    Tuberculosis    as baby   Past Surgical History:  Procedure Laterality Date   BACK SURGERY     x 2   COLONOSCOPY N/A 03/13/2013   Procedure: COLONOSCOPY;  Surgeon: Leighton Ruff, MD;  Location: [facility];  Service: Endoscopy;  Laterality: N/A;   LAPAROSCOPIC APPENDECTOMY     Patient Active Problem List   Diagnosis Date Noted   Stroke ([place]) 11/09/2021   Essential hypertension 11/09/2021   Noncompliance with medication regimen 11/09/2021   Cerebral brain hemorrhage ([place]) 11/09/2021   Gouty arthritis 10/26/2020   Need for hepatitis B vaccination 01/12/2016   Calcification of aorta ([facility])    Recurrent Clostridium difficile diarrhea 11/18/2015   Macrocytic anemia 11/18/2015   Nausea and vomiting 11/18/2015   Leucocytosis 11/17/2015   Protein-calorie malnutrition, severe 11/17/2015   Hypomagnesemia 09/08/2015   Hypokalemia 09/07/2015   Tobacco use disorder 09/07/2015   Cocaine abuse ([place]) 05/27/2015   Anemia 01/29/2013   Nausea vomiting and diarrhea 01/26/2013   Esophagitis 01/26/2013   Anxiety 03/01/2008   Alcohol abuse 03/01/2008   Elevated LFTs  03/01/2008    REFERRING DIAG: CVA   THERAPY DIAG:  Other abnormalities of gait and mobility  Difficulty in walking, not elsewhere classified  Unsteadiness on feet  Muscle weakness (generalized)  PERTINENT HISTORY: TB, fatty liver, calcification of aorta, HA, HTN, remote h/o cocaine abuse, ETOH abuse, GERD, Hypothyroidism   PRECAUTIONS: Fall   SUBJECTIVE: Reports "I can tell it's going to rain:" Reports of fatigue and pain all over. Denies any falls. Has seen multiple doctors. Was with optometrist for 1.5 hours yesterday for testing due to double vision. No changes made to lenses, she see's him again in a month. Has also seen Dr. Esmeralda Links, however can not remember why or what he said.   PAIN:  Are you having pain? Yes NPRS scale: 8/10 Pain location: generalized Pain orientation: Other: Generalized   PAIN TYPE: Chronic Pain description: aching  Aggravating factors: weather Relieving factors: resting  Vitals:   02/13/22 1111  BP: 126/85  Pulse: (!) 111     TODAY'S TREATMENT:    02/13/2022    GAIT: Gait pattern: step through pattern, decreased stride length, shuffling, and narrow BOS Distance walked: 230 x 1 with cane, 115 x 1 no device Assistive device utilized: Single point cane with rubber quad tip; none Level of assistance:  supervision to min guard assist Comments: reminder cues for increased step  length, posture and sequencing with cane.    BALANCE:  Tandem Stance: on airex in wide modified tandem stance with EC for 30 seconds x 2 reps each foot forward, min guard assist for balance. Cues on posture and weight shifting for balance assistance.  Romberg Stance: on airex with feet together for EC 30 seconds x 3 reps, progressing to feet apart for EC head movements left<>right, up<>down for ~10 reps each. Min guard to min assist for balance. Cues on posture and weight shifting to assist with balance.       PATIENT EDUCATION: Education details: Continue with HEP Person  educated: Patient Education method: Explanation, Demonstration, and Verbal cues Education comprehension: verbalized understanding, returned demonstration, verbal cues required, and needs further education   HOME EXERCISE PROGRAM: Access Code: [email or web address]   Updated Goals: SHORT TERM GOALS = LONG TERM GOALS   LONG TERM GOALS:  Patient will be indepenent with initial HEP for strength/balance and completion of daily walking program Baseline: 01/18/22 reports performing current HEP with spouse assistance as needed and no issues. Will benefit from updating as pt progresses.  Target date:  02/23/22 Goal status: REVISED  2.  Patient will improve 5x sit <> stand to </= 11 seconds with UE support  Baseline: 01/18/22: 13.0 sec's no UE support from standard height surface  Target date:  02/23/22 Goal status: REVISED  3.  Patient will improve TUG to </= 10 seconds with LRAD to demo reduced fall risk  Baseline: 01/18/22: 13.62 sec's with cane with supervision, 12.50 no AD with min guard assist  Target date:  02/23/22 Goal status: REVISED  4.  Pt will improve gait speed to >/= 2.62 ft/sec to demo improved community mobility.  Baseline: 01/18/22: 2.29 ft/sec with cane  Target date:  02/23/22 Goal status: REVISED  5.  Pt will improve DGI to >/= 15/24 to demonstrate improved balance and reduced fall risk  Baseline: TBA Target date:  02/23/22 Goal status: INITIAL  6.  Patient will improve FOTO to >/= 65%  Baseline: 48% Target date:  02/23/22 Goal status: IN PROGRESS     Plan:    Clinical Impression Statement Today's skilled session continued to focus on gait with cane vs no AD and balance training on compliant surfaces. No increase in pain reported with VSS with session. The pt is making slow progress toward goals and should benefit from continued PT to progress toward unmet goals.    Personal Factors and Comorbidities Comorbidity 3+;Transportation    Comorbidities TB, fatty liver, calcification of aorta,  HA, HTN, remote h/o cocaine abuse, ETOH abuse, GERD, Hypothyroidism    Examination-Activity Limitations Transfers;Locomotion Level;Stairs;Stand    Examination-Participation Restrictions Cleaning;Community Activity;Driving    Stability/Clinical Decision Making Evolving/Moderate complexity    Rehab Potential Good    PT Frequency 2x / week    PT Duration 4 weeks    PT Treatment/Interventions ADLs/Self Care Home Management;Aquatic Therapy;Cryotherapy;Moist Heat;DME Instruction;Gait training;Stair training;Functional mobility training;Therapeutic activities;Neuromuscular re-education;Balance training;Therapeutic exercise;Patient/family education;Orthotic Fit/Training;Manual techniques;Passive range of motion    PT Next Visit Plan Monitor BP. continue to work on gait with straight cane with quad tip vs no AD; continue to work on LE strengthening and balance training. Negotiation over obstacles and SLS activities.    PT Home Exercise Plan Access Code: [email or web address]    Consulted and Agree with Plan of Care Patient             Willow Ora, PTA, [facility] [address] [place], [place]  [address] [phone] 02/14/22, 8:06 AM

## 2022-02-15 ENCOUNTER — Ambulatory Visit: Payer: 59

## 2022-02-20 ENCOUNTER — Ambulatory Visit: Payer: 59 | Attending: Internal Medicine | Admitting: Physical Therapy

## 2022-02-20 ENCOUNTER — Encounter: Payer: Self-pay | Admitting: Physical Therapy

## 2022-02-20 VITALS — BP 144/91 | HR 105

## 2022-02-20 DIAGNOSIS — R262 Difficulty in walking, not elsewhere classified: Secondary | ICD-10-CM

## 2022-02-20 DIAGNOSIS — M6281 Muscle weakness (generalized): Secondary | ICD-10-CM | POA: Diagnosis not present

## 2022-02-20 DIAGNOSIS — R2689 Other abnormalities of gait and mobility: Secondary | ICD-10-CM | POA: Diagnosis not present

## 2022-02-20 DIAGNOSIS — R2681 Unsteadiness on feet: Secondary | ICD-10-CM

## 2022-02-20 NOTE — Therapy (Signed)
OUTPATIENT PHYSICAL THERAPY TREATMENT NOTE   Patient Name: Holly Phelps MRN: [id number] DOB:12/08/1960, 61 y.o., female Today's Date: 02/20/2022   PCP: Collene Leyden, MD REFERRING PROVIDER: Aline August, MD    PT End of Session - 02/20/22 1021     Visit Number 14    Number of Visits 17    Date for PT Re-Evaluation 02/23/22    Authorization Type Aetna (VL: 52)    PT Start Time 1019    PT Stop Time 1100    PT Time Calculation (min) 41 min    Equipment Utilized During Treatment Gait belt    Activity Tolerance Patient tolerated treatment well;Treatment limited secondary to medical complications (Comment)   limited by elevated BP with activity   Behavior During Therapy WFL for tasks assessed/performed             Past Medical History:  Diagnosis Date   Allergy    Anemia    Calcification of aorta ([place])    Fatty liver    GERD (gastroesophageal reflux disease)    Headache    Hypertension    Hypothyroidism    Tuberculosis    as baby   Past Surgical History:  Procedure Laterality Date   BACK SURGERY     x 2   COLONOSCOPY N/A 03/13/2013   Procedure: COLONOSCOPY;  Surgeon: Leighton Ruff, MD;  Location: [facility];  Service: Endoscopy;  Laterality: N/A;   LAPAROSCOPIC APPENDECTOMY     Patient Active Problem List   Diagnosis Date Noted   Stroke ([place]) 11/09/2021   Essential hypertension 11/09/2021   Noncompliance with medication regimen 11/09/2021   Cerebral brain hemorrhage ([place]) 11/09/2021   Gouty arthritis 10/26/2020   Need for hepatitis B vaccination 01/12/2016   Calcification of aorta ([facility])    Recurrent Clostridium difficile diarrhea 11/18/2015   Macrocytic anemia 11/18/2015   Nausea and vomiting 11/18/2015   Leucocytosis 11/17/2015   Protein-calorie malnutrition, severe 11/17/2015   Hypomagnesemia 09/08/2015   Hypokalemia 09/07/2015   Tobacco use disorder 09/07/2015   Cocaine abuse ([place]) 05/27/2015   Anemia 01/29/2013   Nausea vomiting and diarrhea  01/26/2013   Esophagitis 01/26/2013   Anxiety 03/01/2008   Alcohol abuse 03/01/2008   Elevated LFTs 03/01/2008    REFERRING DIAG: CVA   THERAPY DIAG:  Other abnormalities of gait and mobility  Difficulty in walking, not elsewhere classified  Unsteadiness on feet  Muscle weakness (generalized)  PERTINENT HISTORY: TB, fatty liver, calcification of aorta, HA, HTN, remote h/o cocaine abuse, ETOH abuse, GERD, Hypothyroidism   PRECAUTIONS: Fall   SUBJECTIVE: Feeling rough today, spouse woke her up at 4am because he "heard something" and she had a hard time getting back to sleep. No falls.   PAIN:  Are you having pain? Yes NPRS scale: 7/10 Pain location: generalized Pain orientation: Other: Generalized   PAIN TYPE: Chronic Pain description: aching  Aggravating factors: weather Relieving factors: resting, tylenol  Vitals:   02/20/22 1046 02/20/22 1051  BP: (!) 155/103 (!) 144/91  Pulse: (!) 106 (!) 105      TODAY'S TREATMENT:    02/13/2022   BP before session: 153/92, HR 119. See above for vitals during session and after rest break.       [facility] PT Assessment - 02/20/22 1030       Transfers   Five time sit to stand comments  15.0 sec's no UE support from standard height chair      Ambulation/Gait   Gait velocity 13.59 sec's= 2.41  ft/sec no AD      Standardized Balance Assessment   Standardized Balance Assessment Dynamic Gait Index -unable to perform due to elevated BP and no time after rest break.     Timed Up and Go Test   TUG Normal TUG    Normal TUG (seconds) 15.4   no AD         GAIT: Gait pattern: step through pattern, decreased stride length, and narrow BOS Distance walked: 220 x 1, plus around clinic with session Assistive device utilized: Walker - 2 wheeled and None Level of assistance:  supervision with walker, min guard to supervision with no AD Comments: cues for increased step length with gait  SELF CARE: Administered FOTO survey with  Physical FS score 51 (was 48 at intake).    PATIENT EDUCATION: Education details: Continue with HEP Person educated: Patient Education method: Explanation, Demonstration, and Verbal cues Education comprehension: verbalized understanding, returned demonstration, verbal cues required, and needs further education   HOME EXERCISE PROGRAM: Access Code: [email or web address]   Updated Goals: SHORT TERM GOALS = LONG TERM GOALS   LONG TERM GOALS:  Patient will be indepenent with initial HEP for strength/balance and completion of daily walking program Baseline: 01/18/22 reports performing current HEP with spouse assistance as needed and no issues. Will benefit from updating as pt progresses.  Target date:  02/23/22 Goal status: REVISED  2.  Patient will improve 5x sit <> stand to </= 11 seconds with UE support  Baseline: 01/18/22: 13.0 sec's no UE support, 02/20/22: 15 seconds no UE support, both from standard height surfaces Target date:  Goal status: Not met  3.  Patient will improve TUG to </= 10 seconds with LRAD to demo reduced fall risk  Baseline: 01/18/22: 12.50 no AD, 02/20/22- 15.4 sec's no AD, both with min guard assist for safety Target date:  Goal status: Not Met  4.  Pt will improve gait speed to >/= 2.62 ft/sec to demo improved community mobility.  Baseline: 01/18/22: 2.29 ft/sec with cane, 02/20/22: 2.41 ft/sec no AD Target date:  Goal status: Partially met  5.  Pt will improve DGI to >/= 15/24 to demonstrate improved balance and reduced fall risk Baseline: TBA Target date:  02/23/22 Goal status: INITIAL  6.  Patient will improve FOTO to >/= 65%  Baseline: 48%. 02/20/22- 51% scored today Target date:  Goal status: Not met     Plan:    Clinical Impression Statement Today's skilled session focused on progress toward LTGs with goals partially to not met. Will plan to check remainder of goals at next session.    Personal Factors and Comorbidities Comorbidity 3+;Transportation     Comorbidities TB, fatty liver, calcification of aorta, HA, HTN, remote h/o cocaine abuse, ETOH abuse, GERD, Hypothyroidism    Examination-Activity Limitations Transfers;Locomotion Level;Stairs;Stand    Examination-Participation Restrictions Cleaning;Community Activity;Driving    Stability/Clinical Decision Making Evolving/Moderate complexity    Rehab Potential Good    PT Frequency 2x / week    PT Duration 4 weeks    PT Treatment/Interventions ADLs/Self Care Home Management;Aquatic Therapy;Cryotherapy;Moist Heat;DME Instruction;Gait training;Stair training;Functional mobility training;Therapeutic activities;Neuromuscular re-education;Balance training;Therapeutic exercise;Patient/family education;Orthotic Fit/Training;Manual techniques;Passive range of motion    PT Next Visit Plan Monitor BP. Check remainder of LTGs for anticipated discharge vs recert    PT Home Exercise Plan Access Code: [email or web address]    Consulted and Agree with Plan of Care Patient             Willow Ora, PTA, CLT Outpatient Neuro  [facility] [place] [address], [place],  [address] [phone] 02/20/22, 4:15 PM

## 2022-02-22 ENCOUNTER — Ambulatory Visit: Payer: 59

## 2022-02-22 VITALS — BP 150/89 | HR 106

## 2022-02-22 DIAGNOSIS — R2689 Other abnormalities of gait and mobility: Secondary | ICD-10-CM | POA: Diagnosis not present

## 2022-02-22 DIAGNOSIS — M6281 Muscle weakness (generalized): Secondary | ICD-10-CM | POA: Diagnosis not present

## 2022-02-22 DIAGNOSIS — R2681 Unsteadiness on feet: Secondary | ICD-10-CM | POA: Diagnosis not present

## 2022-02-22 DIAGNOSIS — R262 Difficulty in walking, not elsewhere classified: Secondary | ICD-10-CM | POA: Diagnosis not present

## 2022-02-22 NOTE — Therapy (Signed)
OUTPATIENT PHYSICAL THERAPY TREATMENT NOTE/DISCHARGE SUMMARY   Patient Name: Holly Phelps MRN: [id number] DOB:04-Jun-1961, 61 y.o., female Today's Date: 02/22/2022   PCP: Collene Leyden, MD REFERRING PROVIDER: Aline August, MD   PHYSICAL THERAPY DISCHARGE SUMMARY  Visits from Start of Care: 15  Current functional level related to goals / functional outcomes: See Clinical Impression Statement   Remaining deficits: Muscle Weakness, Decreased Balance   Education / Equipment: HEP Provided   Patient agrees to discharge. Patient goals were not met. Patient is being discharged due to being pleased with the current functional level.    PT End of Session - 02/22/22 1019     Visit Number 15    Number of Visits 17    Date for PT Re-Evaluation 02/23/22    Authorization Type Aetna (VL: 35)    PT Start Time 1017    PT Stop Time 1045    PT Time Calculation (min) 28 min    Equipment Utilized During Treatment Gait belt    Activity Tolerance Patient tolerated treatment well;Treatment limited secondary to medical complications (Comment)   limited by elevated BP with activity   Behavior During Therapy WFL for tasks assessed/performed              Past Medical History:  Diagnosis Date   Allergy    Anemia    Calcification of aorta ([facility])    Fatty liver    GERD (gastroesophageal reflux disease)    Headache    Hypertension    Hypothyroidism    Tuberculosis    as baby   Past Surgical History:  Procedure Laterality Date   BACK SURGERY     x 2   COLONOSCOPY N/A 03/13/2013   Procedure: COLONOSCOPY;  Surgeon: Leighton Ruff, MD;  Location: [facility];  Service: Endoscopy;  Laterality: N/A;   LAPAROSCOPIC APPENDECTOMY     Patient Active Problem List   Diagnosis Date Noted   Stroke ([place]) 11/09/2021   Essential hypertension 11/09/2021   Noncompliance with medication regimen 11/09/2021   Cerebral brain hemorrhage ([place]) 11/09/2021   Gouty arthritis 10/26/2020   Need for  hepatitis B vaccination 01/12/2016   Calcification of aorta ([facility])    Recurrent Clostridium difficile diarrhea 11/18/2015   Macrocytic anemia 11/18/2015   Nausea and vomiting 11/18/2015   Leucocytosis 11/17/2015   Protein-calorie malnutrition, severe 11/17/2015   Hypomagnesemia 09/08/2015   Hypokalemia 09/07/2015   Tobacco use disorder 09/07/2015   Cocaine abuse ([place]) 05/27/2015   Anemia 01/29/2013   Nausea vomiting and diarrhea 01/26/2013   Esophagitis 01/26/2013   Anxiety 03/01/2008   Alcohol abuse 03/01/2008   Elevated LFTs 03/01/2008    REFERRING DIAG: CVA   THERAPY DIAG:  Other abnormalities of gait and mobility  Difficulty in walking, not elsewhere classified  Unsteadiness on feet  Muscle weakness (generalized)  PERTINENT HISTORY: TB, fatty liver, calcification of aorta, HA, HTN, remote h/o cocaine abuse, ETOH abuse, GERD, Hypothyroidism   PRECAUTIONS: Fall   SUBJECTIVE: Feeling pretty good today, reports plans on today being the last visit. No pain today.   PAIN:  Are you having pain? No   Vitals:   02/22/22 1022 02/22/22 1032  BP: (!) 160/95 (!) 150/89  Pulse: (!) 104 (!) 106      TODAY'S TREATMENT:     [facility] PT Assessment - 02/22/22 0001       Dynamic Gait Index   Level Surface Moderate Impairment    Change in Gait Speed Mild Impairment    Gait with  Horizontal Head Turns Mild Impairment    Gait with Vertical Head Turns Mild Impairment    Gait and Pivot Turn Moderate Impairment    Step Over Obstacle Moderate Impairment    Step Around Obstacles Normal    Steps Moderate Impairment    Total Score 13    DGI comment: 13/24              GAIT: Gait pattern: step through pattern, decreased stride length, and narrow BOS Distance walked: around clinic with session and with DGI Assistive device utilized: Walker - 2 wheeled and None Level of assistance:  supervision with walker, min guard to supervision with no AD Comments: cues for increased step  length with gait  Completed Review of the following HEP: Access Code: [email or web address] URL: [email or web address] Date: 02/22/2022 Prepared by: Baldomero Lamy  Exercises - Seated Knee Extension with Resistance  - 1 x daily - 5 x weekly - 1 sets - 10 reps - Seated Hip Abduction with Resistance  - 1 x daily - 5 x weekly - 1 sets - 10 reps - Seated March with Resistance  - 1 x daily - 5 x weekly - 1 sets - 10 reps - Heel Toe Raises with Counter Support  - 1 x daily - 5 x weekly - 1 sets - 10 reps - Standing Hip Abduction with Counter Support  - 1 x daily - 5 x weekly - 1 sets - 10 reps - Standing Tandem Balance with Counter Support  - 1 x daily - 5 x weekly - 1 sets - 3 reps - 15 seconds hold - Standing Balance with Eyes Closed  - 1 x daily - 5 x weekly - 1 sets - 3 reps - 30 hold - Romberg Stance with Head Rotation  - 1 x daily - 5 x weekly - 1 sets - 10 reps   PATIENT EDUCATION: Education details: Continue with HEP; D/C Visit Person educated: Patient Education method: Explanation, Demonstration, and Verbal cues Education comprehension: verbalized understanding, returned demonstration, verbal cues required, and needs further education   HOME EXERCISE PROGRAM: Access Code: [email or web address]   Updated Goals: SHORT TERM GOALS = LONG TERM GOALS   LONG TERM GOALS:  Patient will be indepenent with initial HEP for strength/balance and completion of daily walking program Baseline: 01/18/22 reports performing current HEP with spouse assistance as needed and no issues. Will benefit from updating as pt progresses. Report independence with Updated HEP Target date:  02/23/22 Goal status: MET  2.  Patient will improve 5x sit <> stand to </= 11 seconds with UE support  Baseline: 01/18/22: 13.0 sec's no UE support, 02/20/22: 15 seconds no UE support, both from standard height surfaces Target date:  Goal status: Not met  3.  Patient will improve TUG to </= 10 seconds with LRAD to demo reduced fall  risk  Baseline: 01/18/22: 12.50 no AD, 02/20/22- 15.4 sec's no AD, both with min guard assist for safety Target date:  Goal status: Not Met  4.  Pt will improve gait speed to >/= 2.62 ft/sec to demo improved community mobility.  Baseline: 01/18/22: 2.29 ft/sec with cane, 02/20/22: 2.41 ft/sec no AD Target date:  Goal status: Partially met  5.  Pt will improve DGI to >/= 15/24 to demonstrate improved balance and reduced fall risk Baseline: 9/24, 13/24 Target date:  02/23/22 Goal status: NOT MET  6.  Patient will improve FOTO to >/= 65%  Baseline: 48%. 02/20/22- 51% scored today Target date:  Goal  status: Not met     Plan:    Clinical Impression Statement Today's skilled session focused on finishing assessment toward LTG's. Patient able to make progress toward DGI but unable to meet to goal level. Patient has made limited progress toward LTGs at this time. Patient reports she would like to take a break from PT services at this time and feels comfortable with current functional level. Updated HEP and educated on compliance upon d/c.    Personal Factors and Comorbidities Comorbidity 3+;Transportation    Comorbidities TB, fatty liver, calcification of aorta, HA, HTN, remote h/o cocaine abuse, ETOH abuse, GERD, Hypothyroidism    Examination-Activity Limitations Transfers;Locomotion Level;Stairs;Stand    Examination-Participation Restrictions Cleaning;Community Activity;Driving    Stability/Clinical Decision Making Evolving/Moderate complexity    Rehab Potential Good    PT Frequency 2x / week    PT Duration 4 weeks    PT Treatment/Interventions ADLs/Self Care Home Management;Aquatic Therapy;Cryotherapy;Moist Heat;DME Instruction;Gait training;Stair training;Functional mobility training;Therapeutic activities;Neuromuscular re-education;Balance training;Therapeutic exercise;Patient/family education;Orthotic Fit/Training;Manual techniques;Passive range of motion    PT Next Visit Plan D/C this visit   PT  Home Exercise Plan Access Code: [email or web address]    Consulted and Agree with Plan of Care Patient             Jones Bales, PT, DPT Outpatient Neuro [facility] [address] [place], [place] [address] [phone] 02/22/22, 11:12 AM

## 2022-03-05 ENCOUNTER — Other Ambulatory Visit: Payer: Self-pay | Admitting: Family Medicine

## 2022-03-05 DIAGNOSIS — R748 Abnormal levels of other serum enzymes: Secondary | ICD-10-CM | POA: Diagnosis not present

## 2022-03-05 DIAGNOSIS — R945 Abnormal results of liver function studies: Secondary | ICD-10-CM | POA: Diagnosis not present

## 2022-03-07 ENCOUNTER — Ambulatory Visit
Admission: RE | Admit: 2022-03-07 | Discharge: 2022-03-07 | Disposition: A | Discharge status: Home / Self Care | Payer: 59 | Source: Ambulatory Visit | Attending: Family Medicine | Admitting: Family Medicine

## 2022-03-07 DIAGNOSIS — R748 Abnormal levels of other serum enzymes: Secondary | ICD-10-CM

## 2022-03-07 DIAGNOSIS — K760 Fatty (change of) liver, not elsewhere classified: Secondary | ICD-10-CM | POA: Diagnosis not present

## 2022-03-07 DIAGNOSIS — R945 Abnormal results of liver function studies: Secondary | ICD-10-CM | POA: Diagnosis not present

## 2022-03-07 IMAGING — US US ABDOMEN LIMITED
1 series · 14 of 25 positions shown · non-contrast
Comparison: None.

CLINICAL DATA: Elevated LFTs.

EXAM:
ULTRASOUND ABDOMEN LIMITED RIGHT UPPER QUADRANT

[Series 1: us abdomen limited · 0.19mm/px · 14 of 47 slices shown]
[im 1/47]
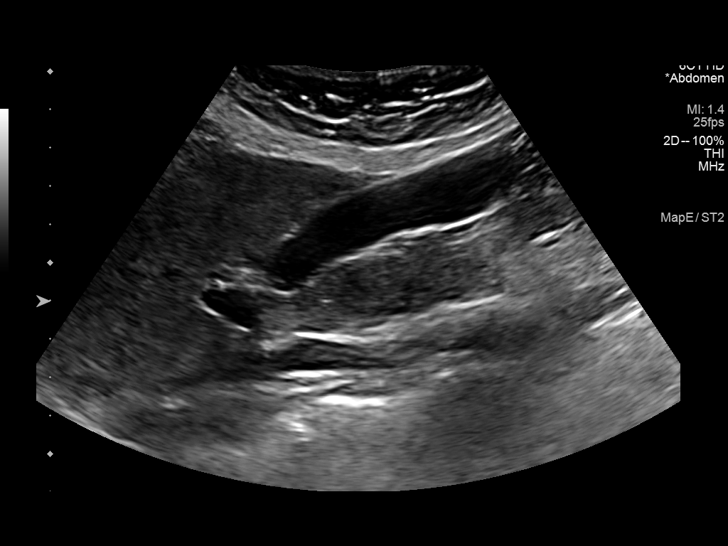
[im 4/47]
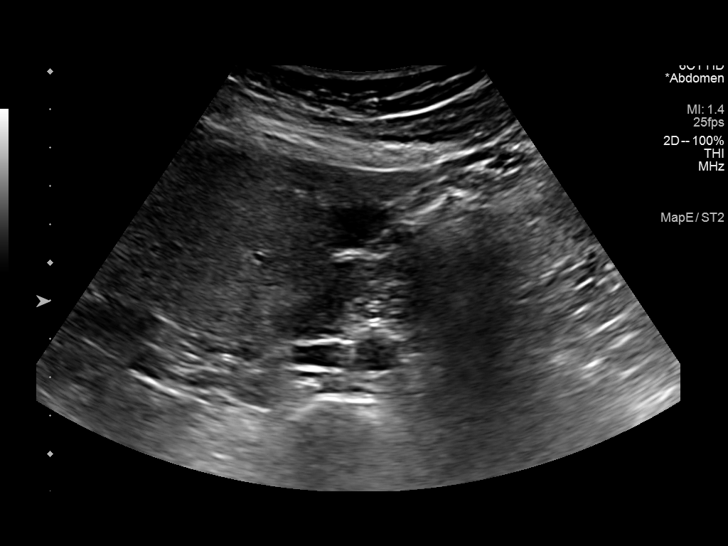
[im 8/47]
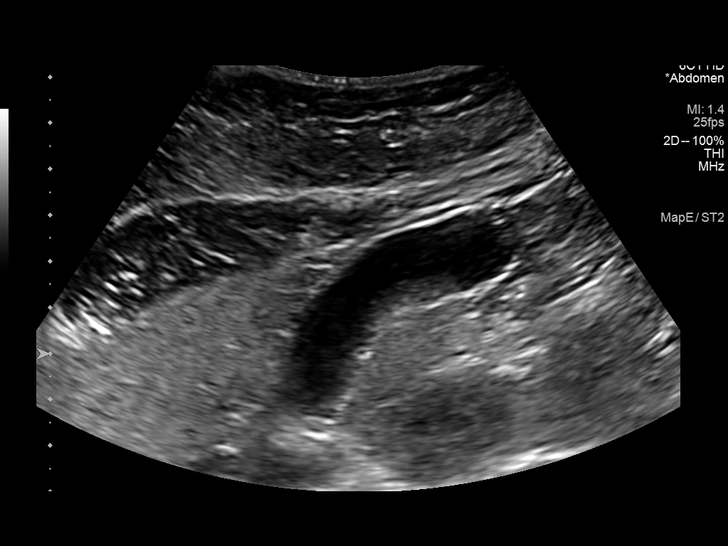
[im 12/47]
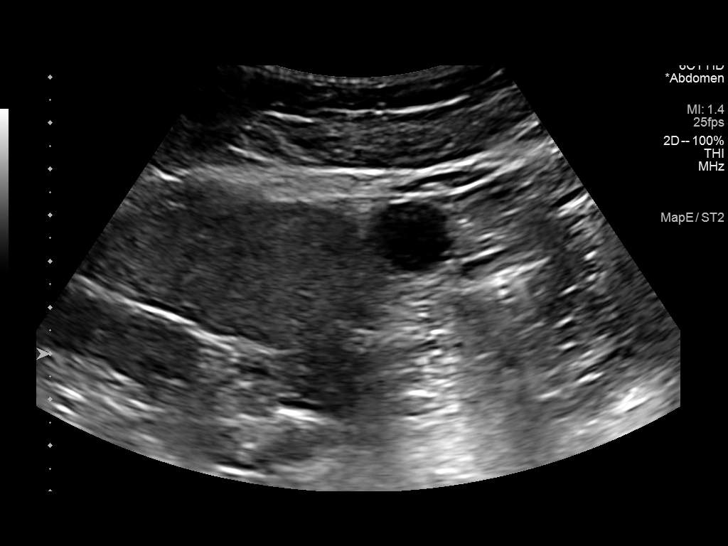
[im 16/47]
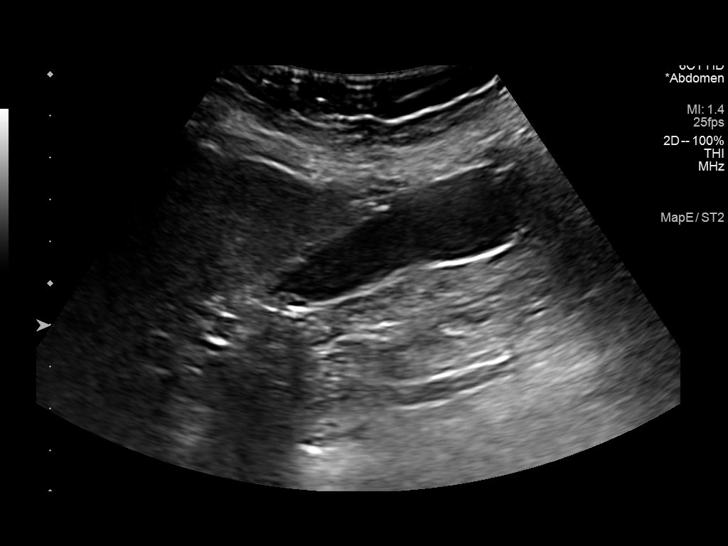
[im 18/47]
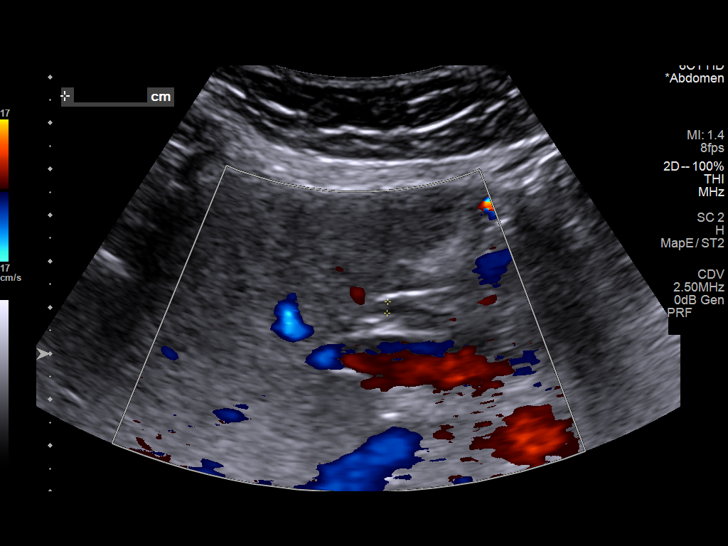
[im 22/47]
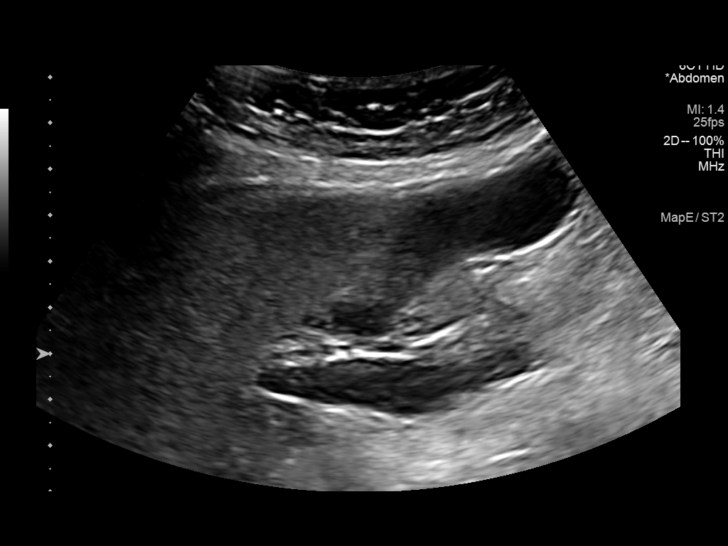
[im 25/47]
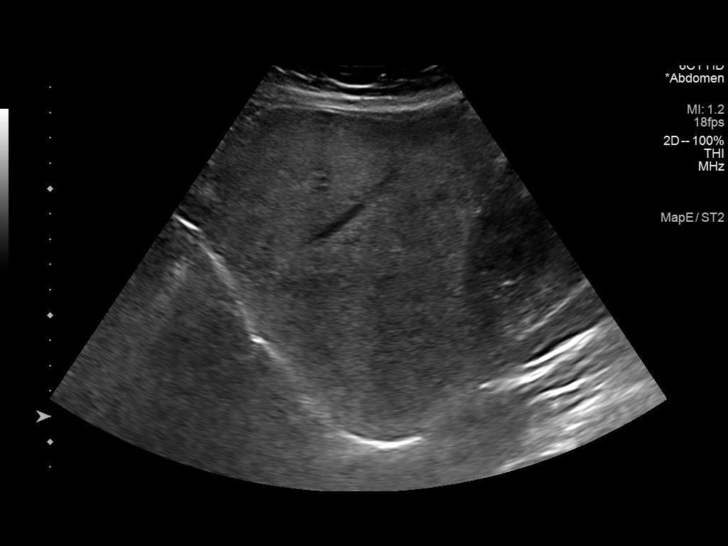
[im 29/47]
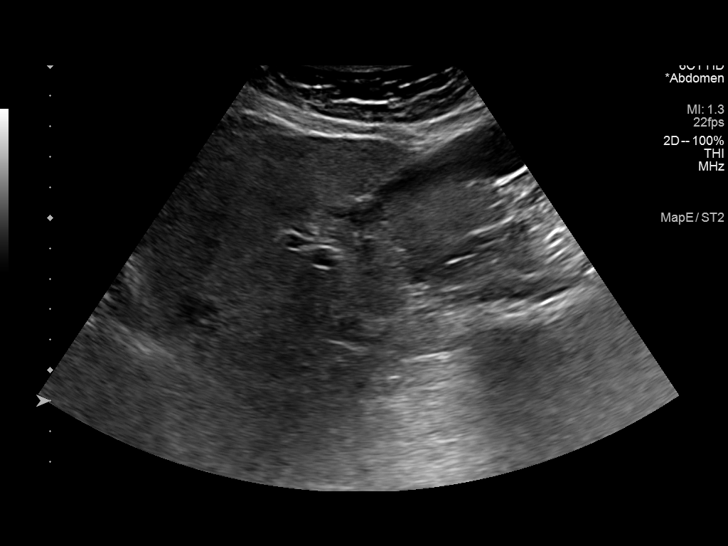
[im 31/47]
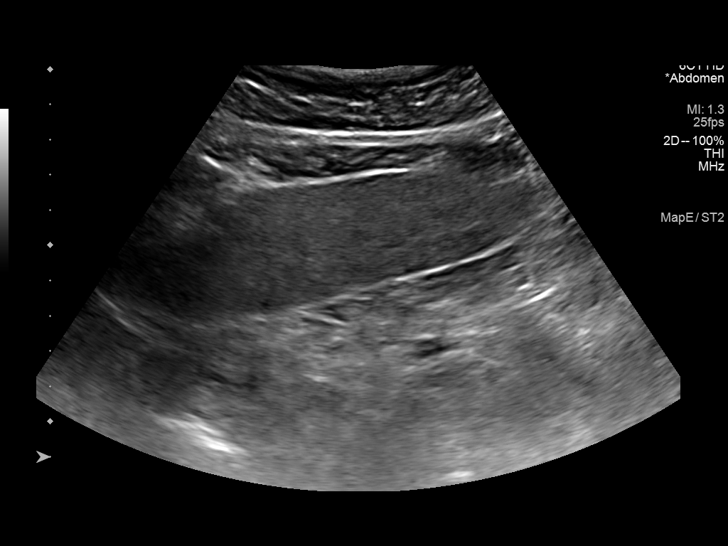
[im 35/47]
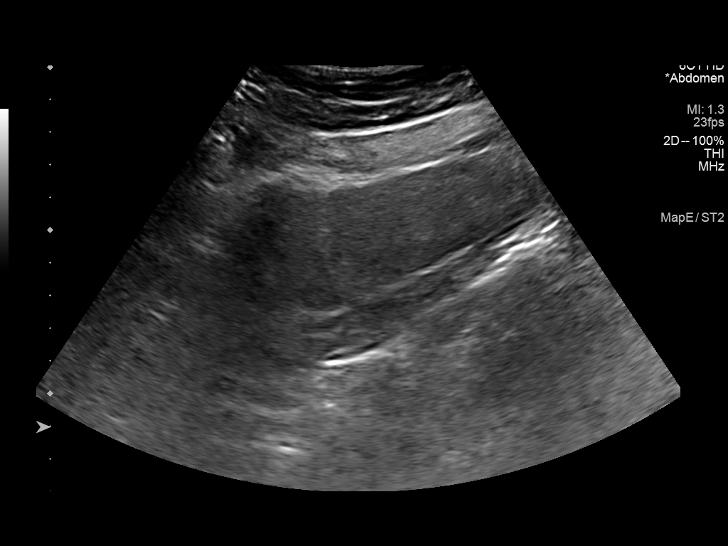
[im 39/47]
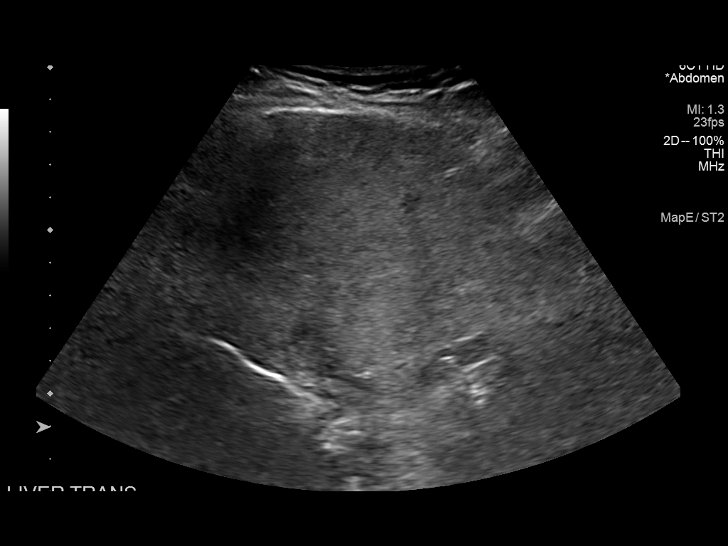
[im 43/47]
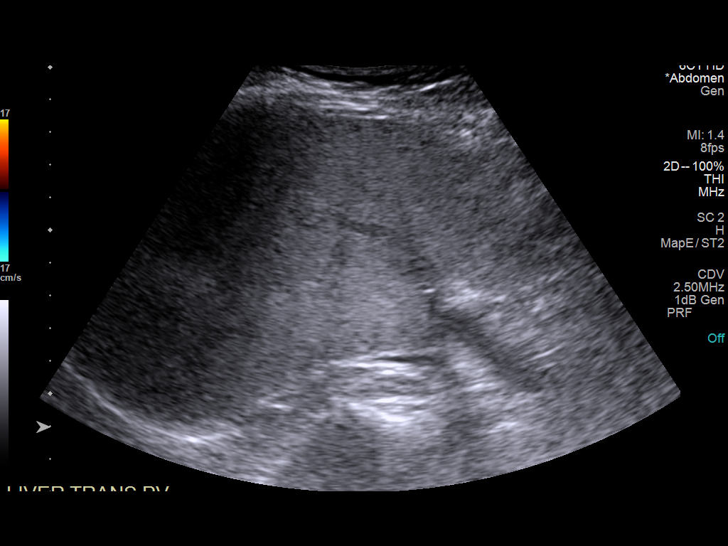
[im 47/47]
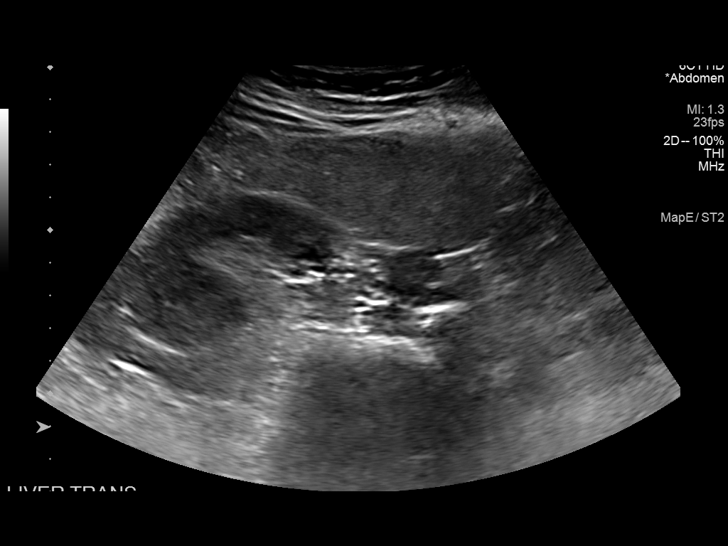

[14 of 25 positions shown; findings below may reference images not displayed]

FINDINGS: Gallbladder:

No gallstones or wall thickening visualized. No sonographic Murphy
sign noted by sonographer.

Common bile duct:

Diameter: 3 mm

Liver:

Increased echogenicity. No focal lesion identified. Portal vein is
patent on color Doppler imaging with normal direction of blood flow
towards the liver.

Other: None.
IMPRESSION: Hepatic steatosis.

No cholelithiasis or sonographic evidence for acute cholecystitis.

## 2022-03-16 ENCOUNTER — Ambulatory Visit: Payer: 59

## 2022-03-19 DIAGNOSIS — H40013 Open angle with borderline findings, low risk, bilateral: Secondary | ICD-10-CM | POA: Diagnosis not present

## 2022-03-19 DIAGNOSIS — H40053 Ocular hypertension, bilateral: Secondary | ICD-10-CM | POA: Diagnosis not present

## 2022-03-23 ENCOUNTER — Ambulatory Visit
Admission: RE | Admit: 2022-03-23 | Discharge: 2022-03-23 | Disposition: A | Discharge status: Home / Self Care | Payer: 59 | Source: Ambulatory Visit | Attending: Family Medicine | Admitting: Family Medicine

## 2022-03-23 DIAGNOSIS — Z1231 Encounter for screening mammogram for malignant neoplasm of breast: Secondary | ICD-10-CM

## 2022-03-23 IMAGING — MG MM DIGITAL SCREENING BILAT W/ TOMO AND CAD
6 of 10 series · 6 of 30 positions shown · non-contrast
Comparison: Previous exam(s).

CLINICAL DATA: Screening.

EXAM:
DIGITAL SCREENING BILATERAL MAMMOGRAM WITH TOMOSYNTHESIS AND CAD
TECHNIQUE: Bilateral screening digital craniocaudal and mediolateral oblique
mammograms were obtained. Bilateral screening digital breast
tomosynthesis was performed. The images were evaluated with
computer-aided detection.

[L CC synth-2D]
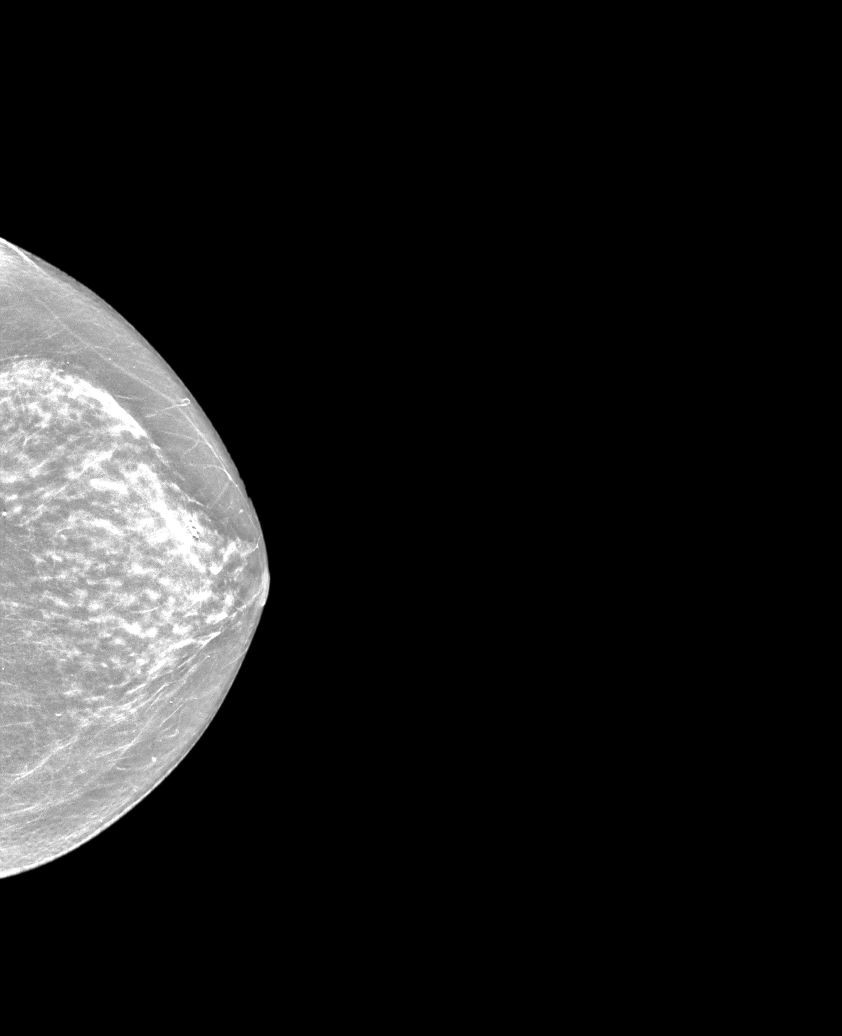

[R CC synth-2D (1 of 2)]
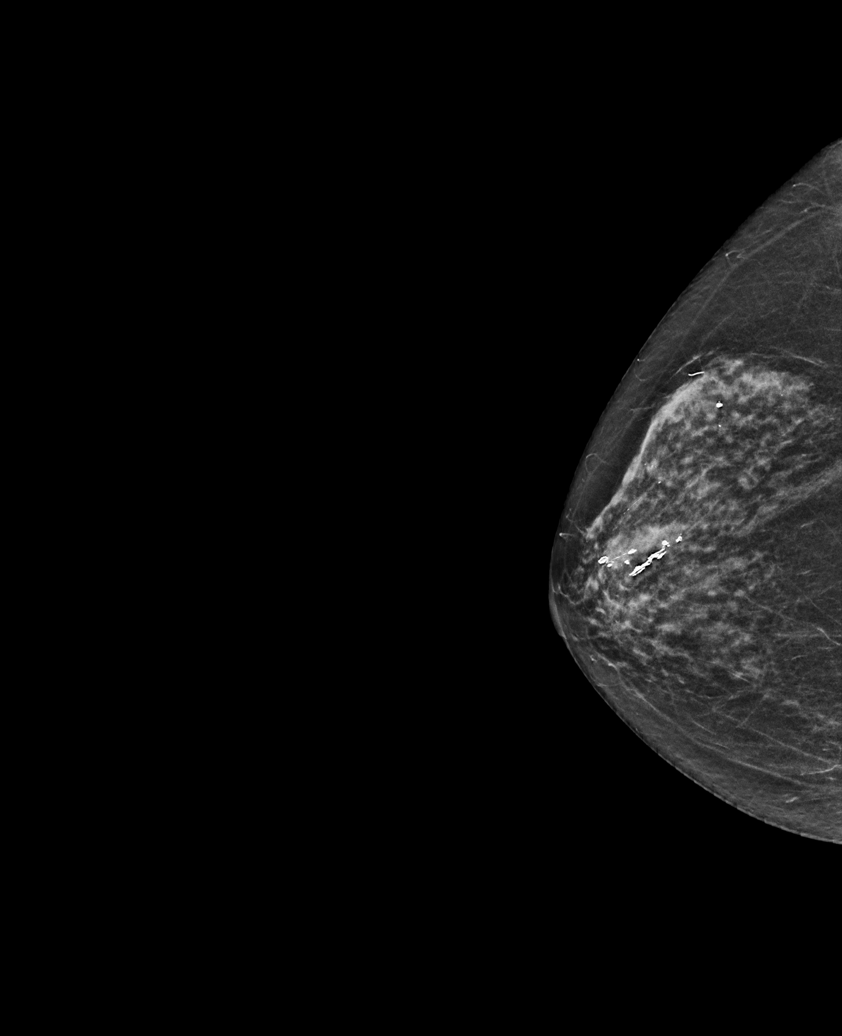

[L MLO synth-2D]
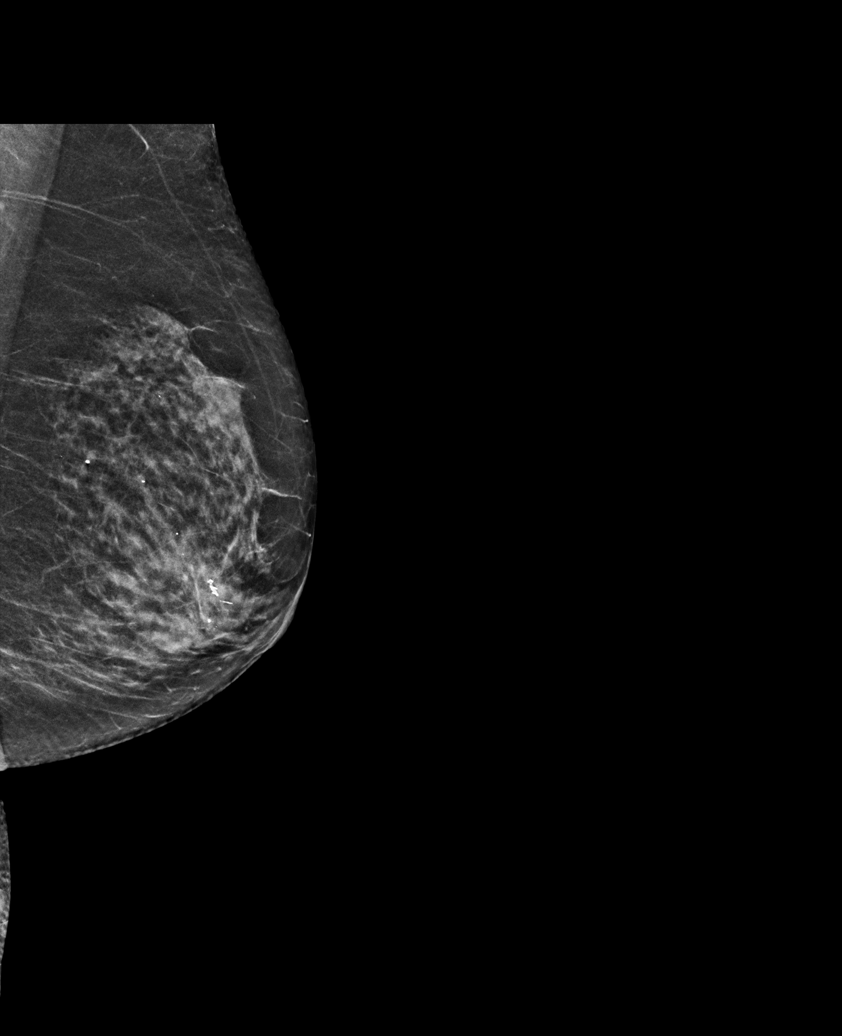

[R MLO synth-2D]
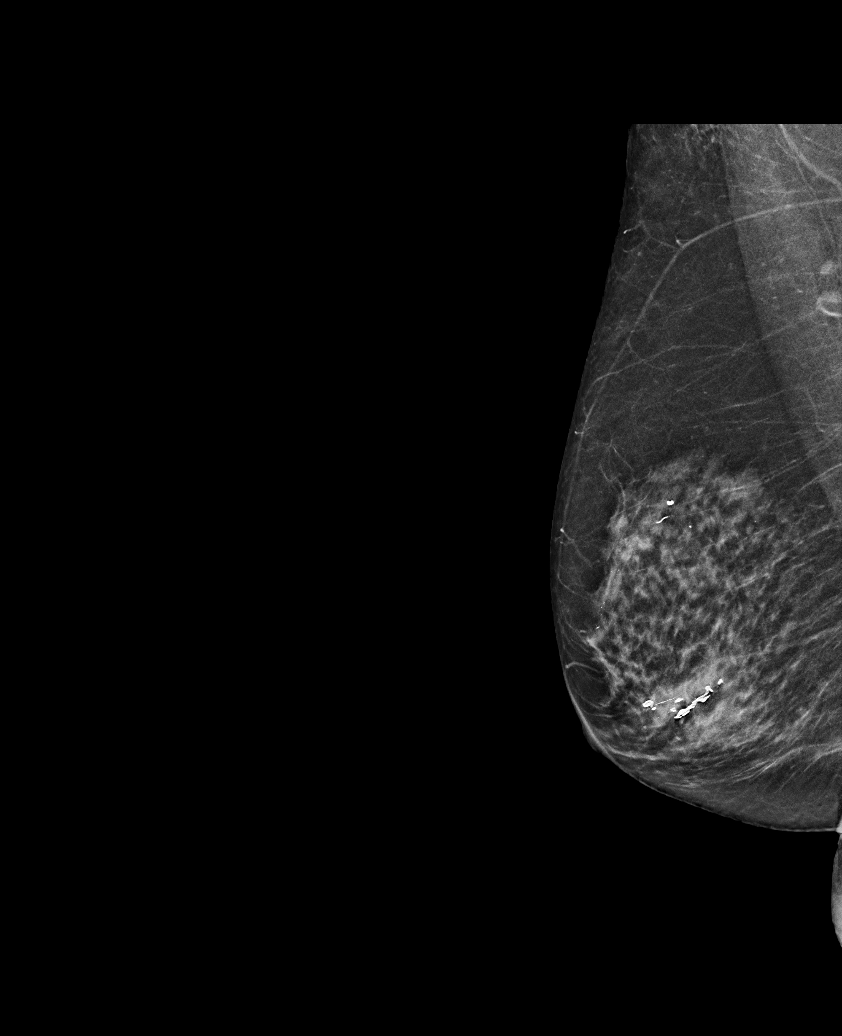

[R CC synth-2D (2 of 2)]
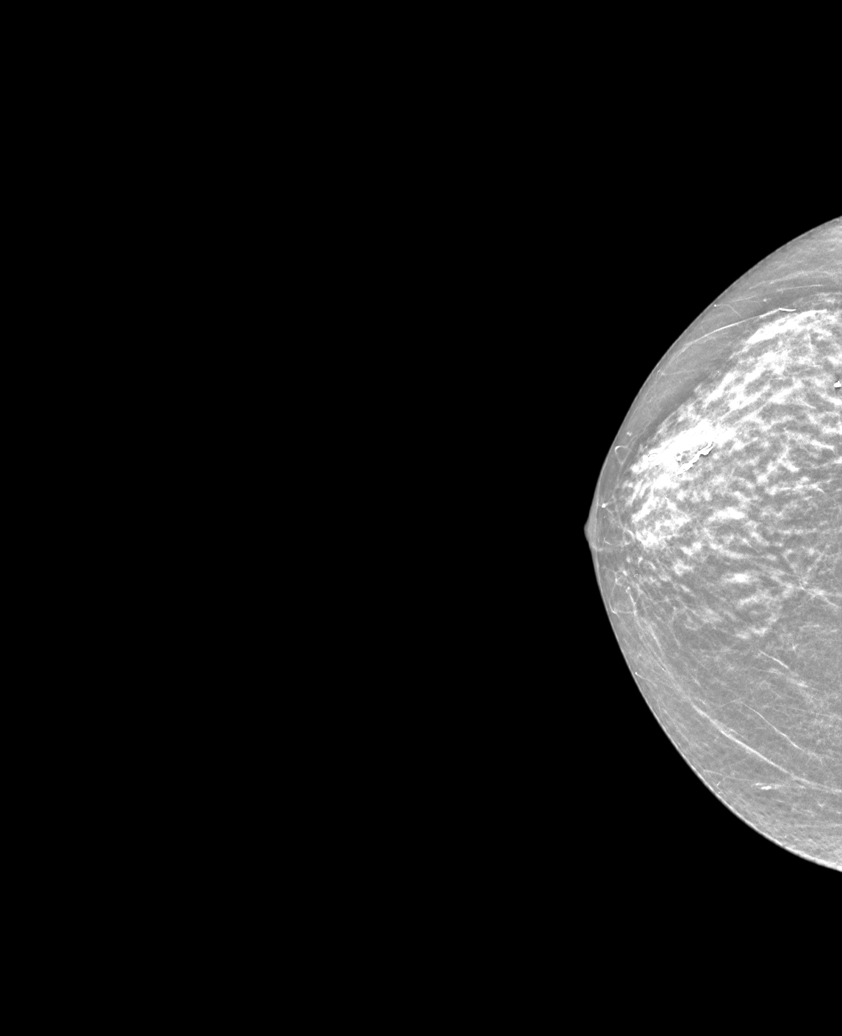

[L MLO tomo · tomo slice 25/49.0]
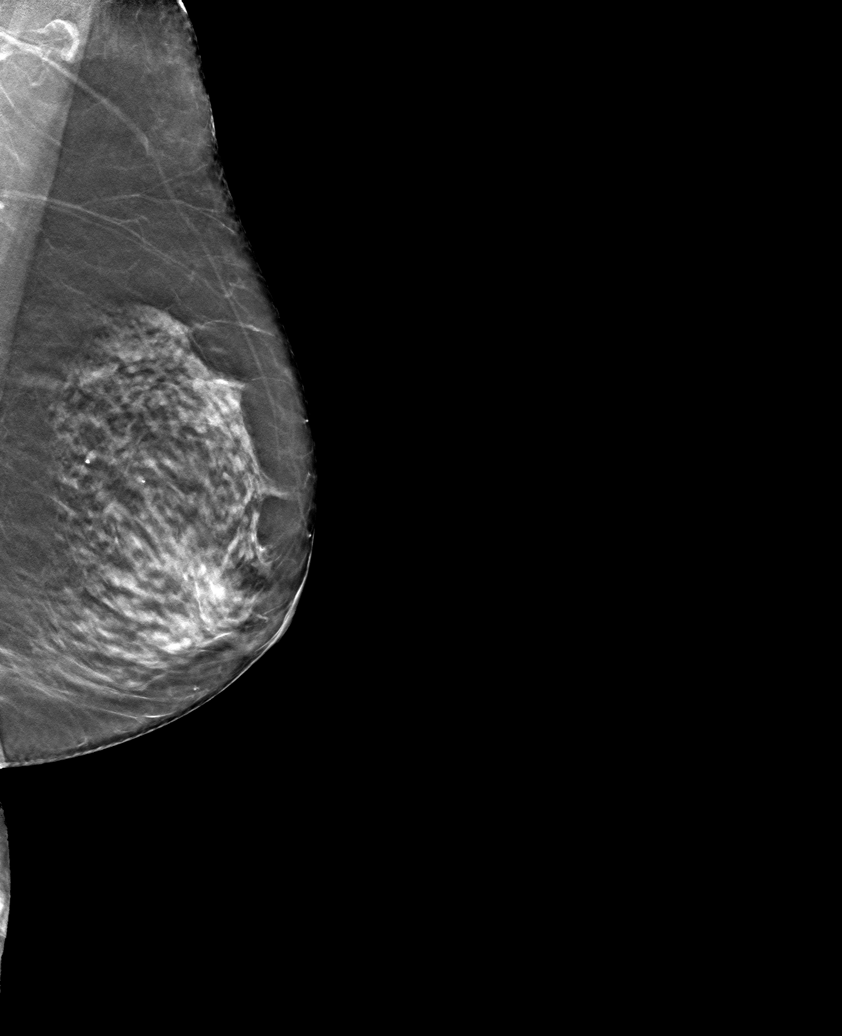

[6 of 30 positions shown; findings below may reference images not displayed]

ACR Breast Density Category c: The breast tissue is heterogeneously
dense, which may obscure small masses.
FINDINGS: There are no findings suspicious for malignancy.
IMPRESSION: No mammographic evidence of malignancy. A result letter of this
screening mammogram will be mailed directly to the patient.

RECOMMENDATION:
Screening mammogram in one year. (Code:[V2])

BI-RADS CATEGORY  1: Negative.

## 2022-04-04 DIAGNOSIS — E039 Hypothyroidism, unspecified: Secondary | ICD-10-CM | POA: Diagnosis not present

## 2022-04-28 ENCOUNTER — Emergency Department (HOSPITAL_COMMUNITY): Payer: 59

## 2022-04-28 ENCOUNTER — Other Ambulatory Visit: Payer: Self-pay

## 2022-04-28 ENCOUNTER — Inpatient Hospital Stay (HOSPITAL_COMMUNITY)
Admission: EM | Admit: 2022-04-28 | Discharge: 2022-05-03 | DRG: 896 | Disposition: A | Discharge status: Home Health | Payer: 59 | Attending: Family Medicine | Admitting: Family Medicine

## 2022-04-28 DIAGNOSIS — R109 Unspecified abdominal pain: Secondary | ICD-10-CM | POA: Diagnosis not present

## 2022-04-28 DIAGNOSIS — K226 Gastro-esophageal laceration-hemorrhage syndrome: Secondary | ICD-10-CM | POA: Diagnosis present

## 2022-04-28 DIAGNOSIS — E785 Hyperlipidemia, unspecified: Secondary | ICD-10-CM | POA: Diagnosis present

## 2022-04-28 DIAGNOSIS — M4854XA Collapsed vertebra, not elsewhere classified, thoracic region, initial encounter for fracture: Secondary | ICD-10-CM | POA: Diagnosis present

## 2022-04-28 DIAGNOSIS — E039 Hypothyroidism, unspecified: Secondary | ICD-10-CM

## 2022-04-28 DIAGNOSIS — G9341 Metabolic encephalopathy: Secondary | ICD-10-CM

## 2022-04-28 DIAGNOSIS — F1010 Alcohol abuse, uncomplicated: Secondary | ICD-10-CM

## 2022-04-28 DIAGNOSIS — R112 Nausea with vomiting, unspecified: Secondary | ICD-10-CM

## 2022-04-28 DIAGNOSIS — E872 Acidosis, unspecified: Secondary | ICD-10-CM

## 2022-04-28 DIAGNOSIS — R6510 Systemic inflammatory response syndrome (SIRS) of non-infectious origin without acute organ dysfunction: Secondary | ICD-10-CM

## 2022-04-28 DIAGNOSIS — R748 Abnormal levels of other serum enzymes: Secondary | ICD-10-CM

## 2022-04-28 DIAGNOSIS — D539 Nutritional anemia, unspecified: Secondary | ICD-10-CM

## 2022-04-28 DIAGNOSIS — E8729 Other acidosis: Secondary | ICD-10-CM

## 2022-04-28 DIAGNOSIS — R7989 Other specified abnormal findings of blood chemistry: Secondary | ICD-10-CM

## 2022-04-28 DIAGNOSIS — F10939 Alcohol use, unspecified with withdrawal, unspecified: Secondary | ICD-10-CM

## 2022-04-28 DIAGNOSIS — N179 Acute kidney failure, unspecified: Secondary | ICD-10-CM

## 2022-04-28 DIAGNOSIS — E440 Moderate protein-calorie malnutrition: Secondary | ICD-10-CM | POA: Insufficient documentation

## 2022-04-28 DIAGNOSIS — A419 Sepsis, unspecified organism: Secondary | ICD-10-CM

## 2022-04-28 DIAGNOSIS — K920 Hematemesis: Secondary | ICD-10-CM

## 2022-04-28 DIAGNOSIS — F10930 Alcohol use, unspecified with withdrawal, uncomplicated: Secondary | ICD-10-CM

## 2022-04-28 DIAGNOSIS — Z8673 Personal history of transient ischemic attack (TIA), and cerebral infarction without residual deficits: Secondary | ICD-10-CM

## 2022-04-28 DIAGNOSIS — E1110 Type 2 diabetes mellitus with ketoacidosis without coma: Secondary | ICD-10-CM | POA: Insufficient documentation

## 2022-04-28 DIAGNOSIS — I10 Essential (primary) hypertension: Secondary | ICD-10-CM | POA: Diagnosis present

## 2022-04-28 DIAGNOSIS — Z79899 Other long term (current) drug therapy: Secondary | ICD-10-CM

## 2022-04-28 DIAGNOSIS — I700 Atherosclerosis of aorta: Secondary | ICD-10-CM | POA: Diagnosis present

## 2022-04-28 DIAGNOSIS — E860 Dehydration: Secondary | ICD-10-CM | POA: Diagnosis present

## 2022-04-28 DIAGNOSIS — Z681 Body mass index (BMI) 19 or less, adult: Secondary | ICD-10-CM | POA: Diagnosis not present

## 2022-04-28 DIAGNOSIS — F10188 Alcohol abuse with other alcohol-induced disorder: Secondary | ICD-10-CM | POA: Diagnosis present

## 2022-04-28 DIAGNOSIS — K219 Gastro-esophageal reflux disease without esophagitis: Secondary | ICD-10-CM | POA: Diagnosis present

## 2022-04-28 DIAGNOSIS — F10130 Alcohol abuse with withdrawal, uncomplicated: Secondary | ICD-10-CM | POA: Diagnosis present

## 2022-04-28 DIAGNOSIS — E1165 Type 2 diabetes mellitus with hyperglycemia: Secondary | ICD-10-CM | POA: Diagnosis present

## 2022-04-28 DIAGNOSIS — K760 Fatty (change of) liver, not elsewhere classified: Secondary | ICD-10-CM | POA: Diagnosis present

## 2022-04-28 DIAGNOSIS — R6520 Severe sepsis without septic shock: Secondary | ICD-10-CM | POA: Diagnosis not present

## 2022-04-28 DIAGNOSIS — E861 Hypovolemia: Secondary | ICD-10-CM | POA: Diagnosis present

## 2022-04-28 DIAGNOSIS — F17210 Nicotine dependence, cigarettes, uncomplicated: Secondary | ICD-10-CM | POA: Diagnosis present

## 2022-04-28 DIAGNOSIS — Z823 Family history of stroke: Secondary | ICD-10-CM

## 2022-04-28 DIAGNOSIS — Z79890 Hormone replacement therapy (postmenopausal): Secondary | ICD-10-CM

## 2022-04-28 DIAGNOSIS — R1110 Vomiting, unspecified: Secondary | ICD-10-CM | POA: Diagnosis not present

## 2022-04-28 DIAGNOSIS — E876 Hypokalemia: Secondary | ICD-10-CM | POA: Diagnosis present

## 2022-04-28 LAB — CBG MONITORING, ED
Glucose-Capillary: 230 mg/dL — ABNORMAL HIGH (ref 70–99)
Glucose-Capillary: 160 mg/dL — ABNORMAL HIGH (ref 70–99)

## 2022-04-28 LAB — BLOOD GAS, VENOUS
Acid-base deficit: 9 mmol/L — ABNORMAL HIGH (ref 0.0–2.0)
Bicarbonate: 16.1 mmol/L — ABNORMAL LOW (ref 20.0–28.0)
O2 Saturation: 39.4 %
Patient temperature: 37
pCO2, Ven: 32 mmHg — ABNORMAL LOW (ref 44–60)
pH, Ven: 7.31 (ref 7.25–7.43)
pO2, Ven: <31 mmHg — CL (ref 32–45)

## 2022-04-28 LAB — CBC WITH DIFFERENTIAL/PLATELET
Abs Immature Granulocytes: 0.08 10*3/uL — ABNORMAL HIGH (ref 0.00–0.07)
Basophils Absolute: 0.1 10*3/uL (ref 0.0–0.1)
Basophils Relative: 1 %
Eosinophils Absolute: 0 10*3/uL (ref 0.0–0.5)
Eosinophils Relative: 0 %
HCT: 33.6 % — ABNORMAL LOW (ref 36.0–46.0)
Hemoglobin: 11 g/dL — ABNORMAL LOW (ref 12.0–15.0)
Immature Granulocytes: 1 %
Lymphocytes Relative: 2 %
Lymphs Abs: 0.2 10*3/uL — ABNORMAL LOW (ref 0.7–4.0)
MCH: 35.5 pg — ABNORMAL HIGH (ref 26.0–34.0)
MCHC: 32.7 g/dL (ref 30.0–36.0)
MCV: 108.4 fL — ABNORMAL HIGH (ref 80.0–100.0)
Monocytes Absolute: 1.1 10*3/uL — ABNORMAL HIGH (ref 0.1–1.0)
Monocytes Relative: 9 %
Neutro Abs: 10.4 10*3/uL — ABNORMAL HIGH (ref 1.7–7.7)
Neutrophils Relative %: 87 %
Platelets: 429 10*3/uL — ABNORMAL HIGH (ref 150–400)
RBC: 3.1 MIL/uL — ABNORMAL LOW (ref 3.87–5.11)
RDW: 12.9 % (ref 11.5–15.5)
WBC: 11.8 10*3/uL — ABNORMAL HIGH (ref 4.0–10.5)
nRBC: 0 % (ref 0.0–0.2)

## 2022-04-28 LAB — BASIC METABOLIC PANEL
Anion gap: >20 — ABNORMAL HIGH (ref 5–15)
BUN: 26 mg/dL — ABNORMAL HIGH (ref 8–23)
CO2: 14 mmol/L — ABNORMAL LOW (ref 22–32)
Calcium: 8 mg/dL — ABNORMAL LOW (ref 8.9–10.3)
Chloride: 106 mmol/L (ref 98–111)
Creatinine, Ser: 1.24 mg/dL — ABNORMAL HIGH (ref 0.44–1.00)
GFR, Estimated: 50 mL/min — ABNORMAL LOW (ref >60)
Glucose, Bld: 142 mg/dL — ABNORMAL HIGH (ref 70–99)
Potassium: 3.8 mmol/L (ref 3.5–5.1)
Sodium: 143 mmol/L (ref 135–145)

## 2022-04-28 LAB — COMPREHENSIVE METABOLIC PANEL
ALT: 65 U/L — ABNORMAL HIGH (ref 0–44)
AST: 134 U/L — ABNORMAL HIGH (ref 15–41)
Albumin: 5.2 g/dL — ABNORMAL HIGH (ref 3.5–5.0)
Alkaline Phosphatase: 240 U/L — ABNORMAL HIGH (ref 38–126)
Anion gap: >20 — ABNORMAL HIGH (ref 5–15)
BUN: 30 mg/dL — ABNORMAL HIGH (ref 8–23)
CO2: 14 mmol/L — ABNORMAL LOW (ref 22–32)
Calcium: 9.9 mg/dL (ref 8.9–10.3)
Chloride: 96 mmol/L — ABNORMAL LOW (ref 98–111)
Creatinine, Ser: 1.74 mg/dL — ABNORMAL HIGH (ref 0.44–1.00)
GFR, Estimated: 33 mL/min — ABNORMAL LOW (ref >60)
Glucose, Bld: 240 mg/dL — ABNORMAL HIGH (ref 70–99)
Potassium: 3.6 mmol/L (ref 3.5–5.1)
Sodium: 144 mmol/L (ref 135–145)
Total Bilirubin: 2.3 mg/dL — ABNORMAL HIGH (ref 0.3–1.2)
Total Protein: 9.6 g/dL — ABNORMAL HIGH (ref 6.5–8.1)

## 2022-04-28 LAB — PHOSPHORUS: Phosphorus: 4.4 mg/dL (ref 2.5–4.6)

## 2022-04-28 LAB — GLUCOSE, CAPILLARY
Glucose-Capillary: 132 mg/dL — ABNORMAL HIGH (ref 70–99)
Glucose-Capillary: 95 mg/dL (ref 70–99)
Glucose-Capillary: 140 mg/dL — ABNORMAL HIGH (ref 70–99)

## 2022-04-28 LAB — URINALYSIS, ROUTINE W REFLEX MICROSCOPIC
Bilirubin Urine: NEGATIVE
Glucose, UA: NEGATIVE mg/dL
Hgb urine dipstick: NEGATIVE
Ketones, ur: 80 mg/dL — AB
Leukocytes,Ua: NEGATIVE
Nitrite: NEGATIVE
Protein, ur: NEGATIVE mg/dL
Specific Gravity, Urine: 1.029 (ref 1.005–1.030)
pH: 6 (ref 5.0–8.0)

## 2022-04-28 LAB — MAGNESIUM: Magnesium: 1.3 mg/dL — ABNORMAL LOW (ref 1.7–2.4)

## 2022-04-28 LAB — BETA-HYDROXYBUTYRIC ACID: Beta-Hydroxybutyric Acid: >8 mmol/L — ABNORMAL HIGH (ref 0.05–0.27)

## 2022-04-28 LAB — LACTIC ACID, PLASMA
Lactic Acid, Venous: 2.1 mmol/L (ref 0.5–1.9)
Lactic Acid, Venous: 3.4 mmol/L (ref 0.5–1.9)
Lactic Acid, Venous: 1.4 mmol/L (ref 0.5–1.9)
Lactic Acid, Venous: 1.3 mmol/L (ref 0.5–1.9)

## 2022-04-28 LAB — GAMMA GT: GGT: 441 U/L — ABNORMAL HIGH (ref 7–50)

## 2022-04-28 LAB — VITAMIN B12: Vitamin B-12: 827 pg/mL (ref 180–914)

## 2022-04-28 LAB — FOLATE: Folate: 33.2 ng/mL (ref >5.9)

## 2022-04-28 LAB — HEMOGLOBIN A1C
Hgb A1c MFr Bld: 4.4 % — ABNORMAL LOW (ref 4.8–5.6)
Mean Plasma Glucose: 79.58 mg/dL

## 2022-04-28 LAB — LIPASE, BLOOD: Lipase: 196 U/L — ABNORMAL HIGH (ref 11–51)

## 2022-04-28 LAB — HIV ANTIBODY (ROUTINE TESTING W REFLEX): HIV Screen 4th Generation wRfx: NONREACTIVE

## 2022-04-28 LAB — PROCALCITONIN: Procalcitonin: <0.1 ng/mL

## 2022-04-28 MED ORDER — LEVOTHYROXINE SODIUM 25 MCG PO TABS
25.0000 ug | ORAL_TABLET | Freq: Every day | ORAL | Status: DC
  Administered 2022-04-29 – 2022-05-03 (×5): 25 ug via ORAL
  Filled 2022-04-28 (×5): qty 1

## 2022-04-28 MED ORDER — LORAZEPAM 2 MG/ML IJ SOLN
0.0000 mg | Freq: Four times a day (QID) | INTRAMUSCULAR | Status: AC
  Administered 2022-04-28: 1 mg via INTRAVENOUS
  Administered 2022-04-30: 2 mg via INTRAVENOUS
  Filled 2022-04-28 (×2): qty 1

## 2022-04-28 MED ORDER — IOHEXOL 300 MG/ML  SOLN
75.0000 mL | Freq: Once | INTRAMUSCULAR | Status: AC | PRN
  Administered 2022-04-28: 70 mL via INTRAVENOUS

## 2022-04-28 MED ORDER — DEXTROSE 50 % IV SOLN: 0.0000 mL | INTRAVENOUS | Status: DC | PRN

## 2022-04-28 MED ORDER — LORAZEPAM 1 MG PO TABS: 0.0000 mg | ORAL_TABLET | Freq: Two times a day (BID) | ORAL | Status: DC

## 2022-04-28 MED ORDER — LACTATED RINGERS IV SOLN: INTRAVENOUS | Status: DC

## 2022-04-28 MED ORDER — LORAZEPAM 1 MG PO TABS: 0.0000 mg | ORAL_TABLET | Freq: Four times a day (QID) | ORAL | Status: AC

## 2022-04-28 MED ORDER — ONDANSETRON HCL 4 MG/2ML IJ SOLN: 4.0000 mg | Freq: Once | INTRAMUSCULAR | Status: DC

## 2022-04-28 MED ORDER — THIAMINE HCL 100 MG PO TABS: 100.0000 mg | ORAL_TABLET | Freq: Every day | ORAL | Status: DC

## 2022-04-28 MED ORDER — DEXTROSE IN LACTATED RINGERS 5 % IV SOLN: INTRAVENOUS | Status: DC

## 2022-04-28 MED ORDER — INSULIN ASPART 100 UNIT/ML IJ SOLN
0.0000 [IU] | INTRAMUSCULAR | Status: DC
  Administered 2022-04-28 (×2): 2 [IU] via SUBCUTANEOUS
  Administered 2022-04-29: 3 [IU] via SUBCUTANEOUS
  Administered 2022-04-29 – 2022-05-02 (×2): 2 [IU] via SUBCUTANEOUS

## 2022-04-28 MED ORDER — SODIUM CHLORIDE 0.9 % IV SOLN: 2.0000 g | INTRAVENOUS | Status: DC

## 2022-04-28 MED ORDER — LORAZEPAM 2 MG/ML IJ SOLN: 0.0000 mg | Freq: Two times a day (BID) | INTRAMUSCULAR | Status: DC

## 2022-04-28 MED ORDER — POTASSIUM CHLORIDE 10 MEQ/100ML IV SOLN
10.0000 meq | INTRAVENOUS | Status: AC
  Administered 2022-04-28 (×2): 10 meq via INTRAVENOUS
  Filled 2022-04-28 (×2): qty 100

## 2022-04-28 MED ORDER — METRONIDAZOLE 500 MG/100ML IV SOLN
500.0000 mg | Freq: Two times a day (BID) | INTRAVENOUS | Status: DC
  Administered 2022-04-28: 500 mg via INTRAVENOUS
  Filled 2022-04-28: qty 100

## 2022-04-28 MED ORDER — PROCHLORPERAZINE EDISYLATE 10 MG/2ML IJ SOLN: 10.0000 mg | Freq: Four times a day (QID) | INTRAMUSCULAR | Status: DC | PRN

## 2022-04-28 MED ORDER — THIAMINE HCL 100 MG/ML IJ SOLN
100.0000 mg | Freq: Every day | INTRAMUSCULAR | Status: DC
  Administered 2022-04-28: 100 mg via INTRAVENOUS
  Filled 2022-04-28: qty 2

## 2022-04-28 MED ORDER — CHLORHEXIDINE GLUCONATE CLOTH 2 % EX PADS: 6.0000 | MEDICATED_PAD | Freq: Every day | CUTANEOUS | Status: DC

## 2022-04-28 MED ORDER — SODIUM CHLORIDE 0.9 % IV SOLN
2.0000 g | Freq: Once | INTRAVENOUS | Status: AC
  Administered 2022-04-28: 2 g via INTRAVENOUS
  Filled 2022-04-28: qty 12.5

## 2022-04-28 MED ORDER — LACTATED RINGERS IV BOLUS
999.0000 mL | Freq: Once | INTRAVENOUS | Status: AC
Start: 2022-04-28 — End: 2022-04-28
  Administered 2022-04-28: 1000 mL via INTRAVENOUS

## 2022-04-28 MED ORDER — FAMOTIDINE 20 MG PO TABS: 20.0000 mg | ORAL_TABLET | Freq: Every evening | ORAL | Status: DC | PRN

## 2022-04-28 MED ORDER — ENOXAPARIN SODIUM 30 MG/0.3ML IJ SOSY
30.0000 mg | PREFILLED_SYRINGE | Freq: Every day | INTRAMUSCULAR | Status: DC
Start: 2022-04-28 — End: 2022-04-28

## 2022-04-28 MED ORDER — THIAMINE HCL 100 MG/ML IJ SOLN
500.0000 mg | Freq: Three times a day (TID) | INTRAMUSCULAR | Status: AC
  Administered 2022-04-28 – 2022-05-01 (×9): 500 mg via INTRAVENOUS
  Filled 2022-04-28 (×11): qty 5

## 2022-04-28 MED ORDER — SODIUM CHLORIDE 0.9 % IV BOLUS
1000.0000 mL | Freq: Once | INTRAVENOUS | Status: AC
  Administered 2022-04-28: 1000 mL via INTRAVENOUS

## 2022-04-28 MED ORDER — VANCOMYCIN HCL IN DEXTROSE 1-5 GM/200ML-% IV SOLN
1000.0000 mg | Freq: Once | INTRAVENOUS | Status: AC
  Administered 2022-04-28: 1000 mg via INTRAVENOUS
  Filled 2022-04-28: qty 200

## 2022-04-28 MED ORDER — DEXTROSE IN LACTATED RINGERS 5 % IV SOLN
INTRAVENOUS | Status: DC
Start: 2022-04-28 — End: 2022-04-29

## 2022-04-28 MED ORDER — PIPERACILLIN-TAZOBACTAM 3.375 G IVPB 30 MIN: 3.3750 g | Freq: Once | INTRAVENOUS | Status: DC

## 2022-04-28 MED ORDER — LORAZEPAM 2 MG/ML IJ SOLN
1.0000 mg | Freq: Once | INTRAMUSCULAR | Status: AC
  Administered 2022-04-28: 1 mg via INTRAVENOUS
  Filled 2022-04-28: qty 1

## 2022-04-28 MED ORDER — INSULIN REGULAR(HUMAN) IN NACL 100-0.9 UT/100ML-% IV SOLN
INTRAVENOUS | Status: DC
  Filled 2022-04-28: qty 100

## 2022-04-28 MED ORDER — VANCOMYCIN HCL IN DEXTROSE 1-5 GM/200ML-% IV SOLN: 1000.0000 mg | INTRAVENOUS | Status: DC

## 2022-04-28 MED ORDER — ENOXAPARIN SODIUM 40 MG/0.4ML IJ SOSY
40.0000 mg | PREFILLED_SYRINGE | Freq: Every day | INTRAMUSCULAR | Status: DC
  Administered 2022-04-28 – 2022-05-02 (×5): 40 mg via SUBCUTANEOUS
  Filled 2022-04-28 (×5): qty 0.4

## 2022-04-28 NOTE — Progress Notes (Signed)
A consult was received from an ED physician for Vancomycin and Cefepime per pharmacy dosing.  The patient's profile has been reviewed for ht/wt/allergies/indication/available labs.   A one time order has been placed for Cefepime 2g, Vanc 1g.  Further antibiotics/pharmacy consults should be ordered by admitting physician if indicated.                       Thank you,  Gretta Arab PharmD, BCPS Clinical Pharmacist [facility] main pharmacy [phone] 04/28/2022 1:43 PM

## 2022-04-28 NOTE — ED Provider Notes (Signed)
[facility] Provider Note   CSN: [id number] Arrival date & time: 04/28/22  0949     History PMH: Alcohol use disorder, hx of CVA, HTN, Hypothyroidism, GERD, Anemia, s/p appendectomy  Chief Complaint  Patient presents with   Emesis   Weakness    Holly Phelps is a 61 y.o. female.  Patient presents with persistent nausea and vomiting since yesterday morning at about 4 AM.  She woke up and immediately started vomiting.  She has not really stopped since then.  Husband says the vomit started clear but has become more more dark and red since last night.  She has been feeling really weak and fatigued.  She has not been able to eat or drink since Thursday.   Per husband, patient does drink "several ounces" of liquor daily for several years. Last alcoholic drink two days ago.  She is currently being worked up for liver disease through her outpatient PCP.  denies any fevers, chills, chest pain, shortness of breath, abdominal pain, abdominal distention, diarrhea, or constipation.   Emesis Associated symptoms: no abdominal pain, no chills, no diarrhea and no fever   Weakness Associated symptoms: nausea and vomiting   Associated symptoms: no abdominal pain, no chest pain, no diarrhea, no fever and no shortness of breath        Home Medications Prior to Admission medications   Medication Sig Start Date End Date Taking? Authorizing Provider  atorvastatin (LIPITOR) 40 MG tablet Take 40 mg by mouth daily. 04/18/22   [provider]  benzonatate (TESSALON) 100 MG capsule Take 100 mg by mouth 3 (three) times daily. 01/01/22   [provider]  famotidine (PEPCID) 20 MG tablet Take 20 mg by mouth daily. 04/18/22   [provider]  levothyroxine (SYNTHROID) 25 MCG tablet Take 25 mcg by mouth daily. 04/22/22   [provider]      Allergies    Patient has no allergy information on record.    Review of Systems   Review of  Systems  Constitutional:  Negative for chills and fever.  Respiratory:  Negative for shortness of breath.   Cardiovascular:  Negative for chest pain.  Gastrointestinal:  Positive for nausea and vomiting. Negative for abdominal distention, abdominal pain, anal bleeding, blood in stool, constipation and diarrhea.       Hematemesis  Neurological:  Positive for weakness.  All other systems reviewed and are negative.   Physical Exam Updated Vital Signs BP (!) 142/112   Pulse (!) 102   Temp 98.9 F (37.2 C) (Oral)   Resp 18   Ht 5' 2"  (1.575 m)   Wt 49.9 kg   SpO2 99%   BMI 20.12 kg/m  Physical Exam Vitals and nursing note reviewed.  Constitutional:      General: She is not in acute distress.    Appearance: She is ill-appearing. She is not toxic-appearing or diaphoretic.     Comments: Appears thin. Actively vomiting large amount of red tinged dark bilious emesis.   HENT:     Head: Normocephalic and atraumatic.     Nose: No nasal deformity.     Mouth/Throat:     Lips: Pink. No lesions.     Mouth: Mucous membranes are moist. No injury, lacerations, oral lesions or angioedema.     Pharynx: Oropharynx is clear. Uvula midline. No pharyngeal swelling, oropharyngeal exudate, posterior oropharyngeal erythema or uvula swelling.  Eyes:     General: Gaze aligned appropriately. No scleral icterus.  Right eye: No discharge.        Left eye: No discharge.     Conjunctiva/sclera: Conjunctivae normal.     Right eye: Right conjunctiva is not injected. No exudate or hemorrhage.    Left eye: Left conjunctiva is not injected. No exudate or hemorrhage. Cardiovascular:     Rate and Rhythm: Regular rhythm. Tachycardia present.     Pulses: Normal pulses.          Radial pulses are 2+ on the right side and 2+ on the left side.       Dorsalis pedis pulses are 2+ on the right side and 2+ on the left side.     Heart sounds: Normal heart sounds, S1 normal and S2 normal. Heart sounds not distant. No  murmur heard.    No friction rub. No gallop. No S3 or S4 sounds.  Pulmonary:     Effort: Pulmonary effort is normal. No accessory muscle usage or respiratory distress.     Breath sounds: Normal breath sounds. No stridor. No wheezing, rhonchi or rales.  Chest:     Chest wall: No tenderness.  Abdominal:     General: Abdomen is flat. There is no distension.     Palpations: Abdomen is soft. There is no mass or pulsatile mass.     Tenderness: There is no abdominal tenderness. There is no right CVA tenderness, left CVA tenderness, guarding or rebound.  Musculoskeletal:     Right lower leg: No edema.     Left lower leg: No edema.  Skin:    General: Skin is warm and dry.     Coloration: Skin is not jaundiced or pale.     Findings: No bruising, erythema, lesion or rash.  Neurological:     General: No focal deficit present.     Mental Status: She is alert and oriented to person, place, and time.     GCS: GCS eye subscore is 4. GCS verbal subscore is 5. GCS motor subscore is 6.  Psychiatric:        Mood and Affect: Mood normal.        Behavior: Behavior normal. Behavior is cooperative.     ED Results / Procedures / Treatments   Labs (all labs ordered are listed, but only abnormal results are displayed) Labs Reviewed  COMPREHENSIVE METABOLIC PANEL - Abnormal; Notable for the following components:      Result Value   Chloride 96 (*)    CO2 14 (*)    Glucose, Bld 240 (*)    BUN 30 (*)    Creatinine, Ser 1.74 (*)    Total Protein 9.6 (*)    Albumin 5.2 (*)    AST 134 (*)    ALT 65 (*)    Alkaline Phosphatase 240 (*)    Total Bilirubin 2.3 (*)    GFR, Estimated 33 (*)    Anion gap >20 (*)    All other components within normal limits  LIPASE, BLOOD - Abnormal; Notable for the following components:   Lipase 196 (*)    All other components within normal limits  BLOOD GAS, VENOUS - Abnormal; Notable for the following components:   pCO2, Ven 32 (*)    pO2, Ven <31 (*)    Bicarbonate  16.1 (*)    Acid-base deficit 9.0 (*)    All other components within normal limits  LACTIC ACID, PLASMA - Abnormal; Notable for the following components:   Lactic Acid, Venous 2.1 (*)  All other components within normal limits  LACTIC ACID, PLASMA - Abnormal; Notable for the following components:   Lactic Acid, Venous 3.4 (*)    All other components within normal limits  CBC WITH DIFFERENTIAL/PLATELET - Abnormal; Notable for the following components:   WBC 11.8 (*)    RBC 3.10 (*)    Hemoglobin 11.0 (*)    HCT 33.6 (*)    MCV 108.4 (*)    MCH 35.5 (*)    Platelets 429 (*)    Neutro Abs 10.4 (*)    Lymphs Abs 0.2 (*)    Monocytes Absolute 1.1 (*)    Abs Immature Granulocytes 0.08 (*)    All other components within normal limits  BETA-HYDROXYBUTYRIC ACID - Abnormal; Notable for the following components:   Beta-Hydroxybutyric Acid >8.00 (*)    All other components within normal limits  CBG MONITORING, ED - Abnormal; Notable for the following components:   Glucose-Capillary 230 (*)    All other components within normal limits  CBG MONITORING, ED - Abnormal; Notable for the following components:   Glucose-Capillary 160 (*)    All other components within normal limits  CULTURE, BLOOD (ROUTINE X 2)  CULTURE, BLOOD (ROUTINE X 2)  CBC WITH DIFFERENTIAL/PLATELET  URINALYSIS, ROUTINE W REFLEX MICROSCOPIC  BASIC METABOLIC PANEL    EKG EKG Interpretation  Date/Time:  Saturday April 28 2022 10:07:50 EDT Ventricular Rate:  134 PR Interval:  136 QRS Duration: 112 QT Interval:  330 QTC Calculation: 493 R Axis:   -16 Text Interpretation: Sinus tachycardia Low voltage, precordial leads Probable anteroseptal infarct, old no prior ECG for comparison. No STEMI Confirmed by Antony Blackbird [id number]) on 04/28/2022 10:25:25 AM  Radiology CT ABDOMEN PELVIS W CONTRAST  Result Date: 04/28/2022 CLINICAL DATA:  Abdominal pain, vomiting EXAM: CT ABDOMEN AND PELVIS WITH CONTRAST TECHNIQUE:  Multidetector CT imaging of the abdomen and pelvis was performed using the standard protocol following bolus administration of intravenous contrast. RADIATION DOSE REDUCTION: This exam was performed according to the departmental dose-optimization program which includes automated exposure control, adjustment of the mA and/or kV according to patient size and/or use of iterative reconstruction technique. CONTRAST:  24m OMNIPAQUE IOHEXOL 300 MG/ML  SOLN COMPARISON:  None Available. FINDINGS: Lower chest: Lower lung fields are clear. Hepatobiliary: There is fatty infiltration in the liver. No focal abnormality is seen. There is no dilation of bile ducts. Gallbladder is not distended. Pancreas: No focal abnormality is seen. Spleen: Unremarkable. Adrenals/Urinary Tract: Adrenals are unremarkable. There is no hydronephrosis. There are no renal or ureteral stones. Evaluation of renal cortex is somewhat limited due to motion artifacts. Urinary bladder is unremarkable. Stomach/Bowel: Small hiatal hernia is seen. There is wall thickening in the lower thoracic esophagus. Stomach is not distended. Small bowel loops are not dilated. Appendix is not seen. Scattered diverticula are seen in the colon without signs of focal acute diverticulitis. Vascular/Lymphatic: Atherosclerotic plaques and calcifications are seen in the aorta and its major branches. Reproductive: Unremarkable. Other: There is no ascites or pneumoperitoneum. Musculoskeletal: There is compression fracture in the upper endplate of body of [id number]. There is retropulsion of upper endplate of body of TG29vertebra causing extrinsic pressure over the ventral margin of thecal sac and spinal stenosis. IMPRESSION: There is no evidence intestinal obstruction or pneumoperitoneum. There is no hydronephrosis. Small hiatal hernia. There is wall thickening in the lower thoracic esophagus suggesting possible reflux esophagitis. There is compression fracture in the upper  endplate of body of TB28vertebra  with 50% decrease in height. This may be recent or old. Please correlate with clinical symptoms. There is retropulsion of upper endplate of body of U13 vertebra causing extrinsic pressure over the ventral margin of thecal sac and spinal stenosis. Fatty liver.  Diverticulosis of colon. Other findings as described in the body of the report. Electronically Signed   By: Elmer Picker M.D.   On: 04/28/2022 12:24   DG Chest 2 View  Result Date: 04/28/2022 CLINICAL DATA:  Vomiting. EXAM: CHEST - 2 VIEW COMPARISON:  None Available. FINDINGS: Cardiac silhouette is normal in size and configuration. Normal mediastinal and hilar contours. Clear lungs.  No pleural effusion or pneumothorax. Mild compression deformity of T12, presumed chronic unless this patient has new back pain. Remainder of the visualized osseous structures are intact. IMPRESSION: 1. No active cardiopulmonary disease. 2. Mild compression fracture of T12, most likely chronic. Clinical correlation warranted. Electronically Signed   By: Lajean Manes M.D.   On: 04/28/2022 11:43    Procedures .Critical Care  Performed by: Adolphus Birchwood, PA-C Authorized by: Adolphus Birchwood, PA-C   Critical care provider statement:    Critical care was necessary to treat or prevent imminent or life-threatening deterioration of the following conditions:  Sepsis, shock, endocrine crisis, dehydration and metabolic crisis   Critical care was time spent personally by me on the following activities:  Blood draw for specimens, development of treatment plan with patient or surrogate, discussions with consultants, discussions with primary provider, evaluation of patient's response to treatment, examination of patient, obtaining history from patient or surrogate, review of old charts, re-evaluation of patient's condition, pulse oximetry, ordering and review of radiographic studies, ordering and review of laboratory studies and ordering  and performing treatments and interventions   Care discussed with: admitting provider     This patient was on telemetry or cardiac monitoring during their time in the ED.    Medications Ordered in ED Medications  LORazepam (ATIVAN) injection 0-4 mg (1 mg Intravenous Given 04/28/22 1219)    Or  LORazepam (ATIVAN) tablet 0-4 mg ( Oral See Alternative 04/28/22 1219)  LORazepam (ATIVAN) injection 0-4 mg (has no administration in time range)    Or  LORazepam (ATIVAN) tablet 0-4 mg (has no administration in time range)  thiamine tablet 100 mg ( Oral See Alternative 04/28/22 1057)    Or  thiamine (B-1) injection 100 mg (100 mg Intravenous Given 04/28/22 1057)  insulin regular, human (MYXREDLIN) 100 units/ 100 mL infusion (has no administration in time range)  lactated ringers infusion (has no administration in time range)  dextrose 5 % in lactated ringers infusion (has no administration in time range)  dextrose 50 % solution 0-50 mL (has no administration in time range)  potassium chloride 10 mEq in 100 mL IVPB (10 mEq Intravenous New Bag/Given 04/28/22 1340)  lactated ringers bolus 999 mL (has no administration in time range)  ceFEPIme (MAXIPIME) 2 g in sodium chloride 0.9 % 100 mL IVPB (has no administration in time range)  vancomycin (VANCOCIN) IVPB 1000 mg/200 mL premix (has no administration in time range)  sodium chloride 0.9 % bolus 1,000 mL (0 mLs Intravenous Stopped 04/28/22 1346)  LORazepam (ATIVAN) injection 1 mg (1 mg Intravenous Given 04/28/22 1056)  sodium chloride 0.9 % bolus 1,000 mL (0 mLs Intravenous Stopped 04/28/22 1428)  iohexol (OMNIPAQUE) 300 MG/ML solution 75 mL (70 mLs Intravenous Contrast Given 04/28/22 1155)    ED Course/ Medical Decision Making/ A&P Clinical Course as of 04/28/22  [place]  Sat Apr 28, 2022  1130 Reviewed prior labs from pending merge record. Patient has new AKI, Hgb is down 1 point from  baseline, LFTs have worsened with T bili of 1.4 > 2.3, AST 78 > 134,  ALT 48 > 65, Alk Phos 107 >240  Lipase 196 which is well above her baseline. Hemoconcentration seems evident on labs likely due to profound dehydration. Also has Lactate of 2.1 thought to  be due to hypovolemia.  [GL]  4268 Patient with anion gap metabolic acidosis, could be DKA v alcoholic ketoacidosis v starvation acidosis. No hx of diabetes though. Beta hydroxy acid ordered and VBG is still pending.  Patient also has CIWA of 10, CIWA protocol has been ordered. There may be an element of withdrawal here as well. Giving a second liter of IVF [GL]  1229 CT a/p does not reveal SBO. Gallbladder and pancreas appear normal. It does reveal evidence of esophagitis with thickening of the distal esophagus. No other intraabdominal infectious source identified [GL]  1325 Lactate continues to worsen despite 2 L IVF. BHB > 8. VBG reveals metabolic acidosis. With elevated blood sugar, we have initiated DKA protocol. Will start on broad spectrum antibiotics but no source for infection has been identified.  [GL]  1 Admit for alcoholic ketoacidosis v DKA Abx empirically started for sepsis presentation Alcohol Withdrawal: CIWA AKI: likely prerenal, giving IVF  [GL]    Clinical Course User Index [GL] Rube Sanchez, Adora Fridge, PA-C                           Medical Decision Making Amount and/or Complexity of Data Reviewed Labs: ordered. Radiology: ordered.  Risk OTC drugs. Prescription drug management. Decision regarding hospitalization.    MDM  This is a 61 y.o. female who presents to the ED with nausea, vomiting, hematemesis The differential of this patient includes but is not limited to SBO, esophageal varices, alcohol withdrawal, PUD, pancreatitis, gallbladder etiology, abnormal electrolytes, DKA, other intraabdominal pathology  My Impression, Plan, and ED Course:  Patient is ill-appearing.  She is profusely vomiting in room with blood-tinged darkened emesis.  She is tachycardic to 135.  Blood pressure  is 158/112.  She is afebrile.  Oxygenating well on RA.  With history of alcohol use, obviously concerned for alcohol withdrawal.  We will try Ativan for vomiting.   She does have hematemesis, but I think it is less likely to be varices and more likely to be from mallory weiss tear given that the blood started later in the course and no cirrhosis. Giving fluids for likely hypovolemia.  Initial blood sugar is 230.   No known history of diabetes, but will obtain VBG to assess for DKA.   CT abdomen pelvis for eval of SBO or other intraabdominal pathology.   I personally ordered, reviewed, and interpreted all laboratory work and imaging and agree with radiologist interpretation. Results interpreted below:  EKG ST, nonischemic, no comparison. CBC: WBC 11.8, Hgb 11.0 (down 1 point from baseline), plat 429 CMP: K 3.6, Cl 96, CO2 14, glucose 240, Creat 1.74 (baseline 0.6), BUN 30, AST 134, ALT 65, Alk Phos 240, T bili 2.3, Anion gap >20 Lipase 196 Lactate 2.1 > 3.4 after 2 L IVF VBG with metabolic acidosis BHB > 8 CXR without acute abnormality CT a/p reveals thickening of the distal esophagus likely representing esophagitis. No other intraabdominal infectious source found  Assessment and Plan - Patient with anion gap metabolic  acidosis. Initial elevated glucose raises suspicion for DKA. Patient has no history of diabetes so this could be a mixed picture with alcoholic ketoacidosis. DKA protocol was initiated - Patient also meeting sepsis criteria. At this point, I have no clear source but with worsening lactate following 2 L of IVF, will start on broad spectrum antibiotics. BP is stable. No pressors needed.  - Likely has element of alcohol withdrawal. CIWA initiated. Scores 10 in ED. Patient has been given multiple doses of Ativan in ED which has improved vomiting symptoms. - New AKI is noted on labs. Likely prerenal. Getting IVF - Lipase is elevated from baseline, but CT does not reveal pancreatitis  changes and patient has no abdominal pain. Lower suspicion for pancreatitis. Worsening transaminitis noted that is likely from alcohol. CT does not reveal any concerning biliary pathology.  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 Hgb stable. Vomiting is improved. Will continue to monitor.   I discussed case with Dr. Marylyn Ishihara who will admit to service.      Charting Requirements Additional history is obtained from:  Independent historian and Spouse/Significant Other External Records from outside source obtained and reviewed including: Reviewed prior admission note for CVA, prior labs, medical history, prior CT abdomen pelvis from last year.  Social Determinants of Health:  Alcoholism/Drug Addiction Pertinant PMH that complicates patient's illness: alcohol use disorder  Patient Care Problems that were addressed during this visit: - Ketoacidosis: Acute illness with systemic symptoms - AKI: Acute illness with systemic symptoms - Elevated Lipase and Transaminitis: Acute illness with complication - Hematemesis: Acute illness with complication and Acute illness with systemic symptoms - SIRS: Acute illness with systemic symptoms - Alcohol Withdrawal: Acute illness with systemic symptoms This patient was maintained on a cardiac monitor/telemetry. I personally viewed and interpreted the cardiac monitor which reveals an underlying rhythm of ST Medications given in ED: 2 L IVF, Ativan x 2, thiamine, Kcl, Cefepime, Vanc,  Reevaluation of the patient after these medicines showed that the patient improved I have reviewed home medications and made changes accordingly. Critical Care Interventions: DKA protocol, Sepsis Consultations: Dr. Marylyn Ishihara from [facility] Disposition: Admit  This is a shared visit with my attending physician, Dr. Sherry Ruffing.  We have discussed this patient and they have independently evaluated this patient. The plan was altered or changed as needed.  Portions of this note were  generated with [employer]. Dictation errors may occur despite best attempts at proofreading.    Final Clinical Impression(s) / ED Diagnoses Final diagnoses:  Ketoacidosis  AKI (acute kidney injury) ([place])  Elevated lipase  Nausea and vomiting, unspecified vomiting type  Hematemesis with nausea  SIRS (systemic inflammatory response syndrome) (H[facility])  Alcohol withdrawal syndrome without complication [facility])    Rx / DC Orders ED Discharge Orders     None         Adolphus Birchwood, PA-C 04/28/22 1433    Tegeler, Gwenyth Allegra, MD 04/29/22 [phone]

## 2022-04-28 NOTE — ED Notes (Signed)
Patient transported to CT 

## 2022-04-28 NOTE — ED Triage Notes (Signed)
Patient to ED w/ c/o vomiting x 24 hours, has not eaten since Thursday.. cbg in triage 230. History of stroke In December 2022. Reports weakness and general feeling unwell.

## 2022-04-28 NOTE — H&P (Signed)
History and Physical    Patient: Holly Phelps MWU:[id number] DOB: 30-Apr-1961 DOA: 04/28/2022 DOS: the patient was seen and examined on 04/28/2022 PCP: [facility]  Patient coming from: Home  Chief Complaint:  Chief Complaint  Patient presents with   Emesis   Weakness   HPI: Holly Phelps is a 61 y.o. female with medical history significant of HLD, hypothyroidism, GERD, CVA, HTN. Presenting with nausea/vomiting. History is per husband. He reports that she has been vomiting fairly constantly over the last 2 days. He tried to give her an anti-emetic suppository, but it didn't help. She's not able to keep anything down. He does report that she has had no appetite for a week or more. He reports that she typically drinking 4 - 5 oz of liquor nightly and that she may have increased that this week due to stress. However, she has not drank the last two days. She has not c/o any other issues. When her symptoms did not improve this morning, he decided to bring her to ED for assistance.    Review of Systems: As mentioned in the history of present illness. All other systems reviewed and are negative.  PMHx  Anemia     Calcification of aorta (HCC)     Fatty liver     GERD (gastroesophageal reflux disease)     Headache     Hypertension     Hypothyroidism     Tuberculosis     PSHx  BACK SURGERY        x 2   COLONOSCOPY N/A 03/13/2013    Procedure: COLONOSCOPY;  Surgeon: Leighton Ruff, MD;  Location: [facility];  Service: Endoscopy;  Laterality: N/A;   LAPAROSCOPIC APPENDECTOMY      Social History: +tobacco, +EtOH, -[facility]  Allergies:  Codeine Nausea And Vomiting   Flexeril [Cyclobenzaprine] Itching   Sulfa Antibiotics        Pt does not recall what the reaction was,been too long   Synthroid [Levothyroxine] Rash    FamHx  Stroke Father     Stroke Brother     Stroke Brother     Prior to Admission medications   Medication Sig Start Date End Date Taking?  Authorizing Provider  atorvastatin (LIPITOR) 40 MG tablet Take 40 mg by mouth daily. 04/18/22   [provider]  benzonatate (TESSALON) 100 MG capsule Take 100 mg by mouth 3 (three) times daily. 01/01/22   [provider]  famotidine (PEPCID) 20 MG tablet Take 20 mg by mouth daily. 04/18/22   [provider]  levothyroxine (SYNTHROID) 25 MCG tablet Take 25 mcg by mouth daily. 04/22/22   [provider]    Physical Exam: Vitals:   04/28/22 1229 04/28/22 1333 04/28/22 1343 04/28/22 1347  BP: (!) 162/106   (!) 142/112  Pulse: (!) 132   (!) 102  Resp: 18   18  Temp:      TempSrc:      SpO2: 100%   99%  Weight:  49.9 kg    Height:   '5\' 2"'$  (1.575 m)    General: 61 y.o. female resting in bed in NAD Eyes: PERRL, normal sclera ENMT: Nares patent w/o discharge, orophaynx clear, dentition normal, ears w/o discharge/lesions/ulcers Neck: Supple, trachea midline Cardiovascular: tachy, +S1, S2, no m/g/r, equal pulses throughout Respiratory: CTABL, no w/r/r, normal WOB GI: BS+, NDNT, no masses noted, no organomegaly noted MSK: No e/c/c Neuro: A&O x 3, incomplete exam d/t difficulty following directions Psyc: flat affect,  she is somewhat confused, calm  Data Reviewed:  Na+  144 CO2  14  ->  14 Glucose  240 ->  142 Scr  1.74  -> 124 Anion Gap  >20 Lipase  196 AST  134 ALT  65 T bili  2.3 Lactic acid  2.1  -> 3.4 WBC  11.8 Hgb  11.0 Beta hydroxybutyric acid  >8.00  CXR: 1. No active cardiopulmonary disease. 2. Mild compression fracture of T12, most likely chronic. Clinical correlation warranted.  CT ab/pelvis w/ contrast There is no evidence intestinal obstruction or pneumoperitoneum. There is no hydronephrosis. Small hiatal hernia. There is wall thickening in the lower thoracic esophagus suggesting possible reflux esophagitis. There is compression fracture in the upper endplate of body of W26 vertebra with 50% decrease in height. This may be  recent or old. Please correlate with clinical symptoms. There is retropulsion of upper endplate of body of V78 vertebra causing extrinsic pressure over the ventral margin of thecal sac and spinal stenosis. Fatty liver.  Diverticulosis of colon. Other findings as described in the body of the report.  Assessment and Plan: Alcoholic ketoacidosis EtOH withdrawal EtOH abuse     - admit to inpt, SDU     - initially thought to be DKA, but UA is negative for glucose and her A1c in 12/22 was 4.4; her presentation is more c/w alcoholic ketoacidosis     - stop Endotool     - continue D5LR, add high dose thiamine x 72 hours     - encourage diet     - follow phos, Mg2+     - dietician consult     - CIWA     - MVI, folate  Sepsis secondary to unknown cause     - started on sepsis protocol in ED; will continue for now     - follow Bld Cx, Ucx, procal     - follow lactic acid     - continue vanc, cefepime     - CT/CXR negative  Acute metabolic encephalopathy     - secondary to above  AKI     - imaging negative for obstruction     - fluids  Elevated LFTs Elevated lipase     - imaging negative     - check hepatitis panel  Macrocytic anemia     - check B12, THF     - no evidence of bleed, follow  Hx of CVA HLD     - hold statin d/t elevated LFTs  Hypothyroidism     - continue home regimen  Advance Care Planning:   Code Status: FULL  Consults: None  Family Communication: w/ husband by phone  Severity of Illness: The appropriate patient status for this patient is INPATIENT. Inpatient status is judged to be reasonable and necessary in order to provide the required intensity of service to ensure the patient's safety. The patient's presenting symptoms, physical exam findings, and initial radiographic and laboratory data in the context of their chronic comorbidities is felt to place them at high risk for further clinical deterioration. Furthermore, it is not anticipated that the  patient will be medically stable for discharge from the hospital within 2 midnights of admission.   * I certify that at the point of admission it is my clinical judgment that the patient will require inpatient hospital care spanning beyond 2 midnights from the point of admission due to high intensity of service, high risk for further deterioration and high frequency of  surveillance required.*  Author: Jonnie Finner, DO 04/28/2022 1:53 PM  For on call review [email or web address].

## 2022-04-28 NOTE — Progress Notes (Signed)
Pharmacy Antibiotic Note  Holly Phelps is a 61 y.o. female admitted on 04/28/2022 with sepsis.  Pharmacy has been consulted for Cefepime and Vancomycin dosing.  Plan: Cefepime 2g IV q24h  Vancomycin 1g IV q48h Measure Vanc levels as needed.  Goal AUC = 400 - 550 Follow up renal function, culture results, and clinical course.   Height: '5\' 2"'$  (157.5 cm) Weight: 49.9 kg (110 lb) IBW/kg (Calculated) : 50.1  Temp (24hrs), Avg:98.7 F (37.1 C), Min:98.4 F (36.9 C), Max:98.9 F (37.2 C)  Recent Labs  Lab 04/28/22 1030 04/28/22 1058 04/28/22 1232 04/28/22 1511  WBC  --  11.8*  --   --   CREATININE 1.74*  --   --  1.24*  LATICACIDVEN  --  2.1* 3.4*  --     Estimated Creatinine Clearance: 37.5 mL/min (A) (by C-G formula based on SCr of 1.24 mg/dL (H)).    No Known Allergies   Thank you for allowing pharmacy to be a part of this patient's care.  Gretta Arab PharmD, BCPS Clinical Pharmacist [facility] main pharmacy [phone] 04/28/2022 6:20 PM

## 2022-04-29 DIAGNOSIS — E8729 Other acidosis: Secondary | ICD-10-CM | POA: Diagnosis not present

## 2022-04-29 LAB — RENAL FUNCTION PANEL
Albumin: 3.2 g/dL — ABNORMAL LOW (ref 3.5–5.0)
Anion gap: 9 (ref 5–15)
BUN: 11 mg/dL (ref 8–23)
CO2: 27 mmol/L (ref 22–32)
Calcium: 7.8 mg/dL — ABNORMAL LOW (ref 8.9–10.3)
Chloride: 102 mmol/L (ref 98–111)
Creatinine, Ser: 0.71 mg/dL (ref 0.44–1.00)
GFR, Estimated: >60 mL/min
Glucose, Bld: 103 mg/dL — ABNORMAL HIGH (ref 70–99)
Phosphorus: 2.9 mg/dL (ref 2.5–4.6)
Potassium: 3.3 mmol/L — ABNORMAL LOW (ref 3.5–5.1)
Sodium: 138 mmol/L (ref 135–145)

## 2022-04-29 LAB — COMPREHENSIVE METABOLIC PANEL
ALT: 42 U/L (ref 0–44)
AST: 84 U/L — ABNORMAL HIGH (ref 15–41)
Albumin: 3.5 g/dL (ref 3.5–5.0)
Alkaline Phosphatase: 164 U/L — ABNORMAL HIGH (ref 38–126)
Anion gap: 9 (ref 5–15)
BUN: 16 mg/dL (ref 8–23)
CO2: 28 mmol/L (ref 22–32)
Calcium: 8.4 mg/dL — ABNORMAL LOW (ref 8.9–10.3)
Chloride: 106 mmol/L (ref 98–111)
Creatinine, Ser: 0.81 mg/dL (ref 0.44–1.00)
GFR, Estimated: >60 mL/min (ref >60)
Glucose, Bld: 126 mg/dL — ABNORMAL HIGH (ref 70–99)
Potassium: 2.6 mmol/L — CL (ref 3.5–5.1)
Sodium: 143 mmol/L (ref 135–145)
Total Bilirubin: 0.8 mg/dL (ref 0.3–1.2)
Total Protein: 6.7 g/dL (ref 6.5–8.1)

## 2022-04-29 LAB — CBC
HCT: 24.1 % — ABNORMAL LOW (ref 36.0–46.0)
Hemoglobin: 8.2 g/dL — ABNORMAL LOW (ref 12.0–15.0)
MCH: 36.6 pg — ABNORMAL HIGH (ref 26.0–34.0)
MCHC: 34 g/dL (ref 30.0–36.0)
MCV: 107.6 fL — ABNORMAL HIGH (ref 80.0–100.0)
Platelets: 222 10*3/uL (ref 150–400)
RBC: 2.24 MIL/uL — ABNORMAL LOW (ref 3.87–5.11)
RDW: 12.7 % (ref 11.5–15.5)
WBC: 9.9 10*3/uL (ref 4.0–10.5)
nRBC: 0 % (ref 0.0–0.2)

## 2022-04-29 LAB — MAGNESIUM
Magnesium: 1.1 mg/dL — ABNORMAL LOW (ref 1.7–2.4)
Magnesium: 1.8 mg/dL (ref 1.7–2.4)
Magnesium: 2.7 mg/dL — ABNORMAL HIGH (ref 1.7–2.4)

## 2022-04-29 LAB — GLUCOSE, CAPILLARY
Glucose-Capillary: 135 mg/dL — ABNORMAL HIGH (ref 70–99)
Glucose-Capillary: 175 mg/dL — ABNORMAL HIGH (ref 70–99)
Glucose-Capillary: 97 mg/dL (ref 70–99)
Glucose-Capillary: 99 mg/dL (ref 70–99)
Glucose-Capillary: 101 mg/dL — ABNORMAL HIGH (ref 70–99)
Glucose-Capillary: 98 mg/dL (ref 70–99)

## 2022-04-29 LAB — PHOSPHORUS
Phosphorus: <1 mg/dL — CL (ref 2.5–4.6)
Phosphorus: 3.1 mg/dL (ref 2.5–4.6)

## 2022-04-29 LAB — PROTIME-INR
INR: 1 (ref 0.8–1.2)
Prothrombin Time: 13.3 seconds (ref 11.4–15.2)

## 2022-04-29 LAB — CORTISOL-AM, BLOOD: Cortisol - AM: 15.7 ug/dL (ref 6.7–22.6)

## 2022-04-29 LAB — MRSA NEXT GEN BY PCR, NASAL: MRSA by PCR Next Gen: NOT DETECTED

## 2022-04-29 LAB — PROCALCITONIN: Procalcitonin: <0.1 ng/mL

## 2022-04-29 LAB — POTASSIUM: Potassium: 3.1 mmol/L — ABNORMAL LOW (ref 3.5–5.1)

## 2022-04-29 MED ORDER — SENNOSIDES-DOCUSATE SODIUM 8.6-50 MG PO TABS: 1.0000 | ORAL_TABLET | Freq: Every evening | ORAL | Status: DC | PRN

## 2022-04-29 MED ORDER — OXYCODONE HCL 5 MG PO TABS
5.0000 mg | ORAL_TABLET | ORAL | Status: DC | PRN
  Administered 2022-05-02: 5 mg via ORAL
  Filled 2022-04-29: qty 1

## 2022-04-29 MED ORDER — MAGNESIUM SULFATE 4 GM/100ML IV SOLN
4.0000 g | Freq: Once | INTRAVENOUS | Status: AC
  Administered 2022-04-29: 4 g via INTRAVENOUS
  Filled 2022-04-29: qty 100

## 2022-04-29 MED ORDER — POTASSIUM PHOSPHATES 15 MMOLE/5ML IV SOLN
30.0000 mmol | Freq: Once | INTRAVENOUS | Status: AC
  Administered 2022-04-29: 30 mmol via INTRAVENOUS
  Filled 2022-04-29: qty 10

## 2022-04-29 MED ORDER — HYDRALAZINE HCL 20 MG/ML IJ SOLN: 10.0000 mg | INTRAMUSCULAR | Status: DC | PRN

## 2022-04-29 MED ORDER — POTASSIUM CHLORIDE CRYS ER 20 MEQ PO TBCR
40.0000 meq | EXTENDED_RELEASE_TABLET | ORAL | Status: AC
  Administered 2022-04-29 (×2): 40 meq via ORAL
  Filled 2022-04-29 (×2): qty 2

## 2022-04-29 MED ORDER — ACETAMINOPHEN 325 MG PO TABS
650.0000 mg | ORAL_TABLET | Freq: Four times a day (QID) | ORAL | Status: DC | PRN
  Administered 2022-04-30 – 2022-05-03 (×3): 650 mg via ORAL
  Filled 2022-04-29 (×3): qty 2

## 2022-04-29 MED ORDER — PANTOPRAZOLE SODIUM 40 MG PO TBEC
40.0000 mg | DELAYED_RELEASE_TABLET | Freq: Two times a day (BID) | ORAL | Status: DC
  Administered 2022-04-29 – 2022-05-03 (×9): 40 mg via ORAL
  Filled 2022-04-29 (×9): qty 1

## 2022-04-29 MED ORDER — POTASSIUM CHLORIDE CRYS ER 20 MEQ PO TBCR
40.0000 meq | EXTENDED_RELEASE_TABLET | Freq: Once | ORAL | Status: AC
  Administered 2022-04-29: 40 meq via ORAL
  Filled 2022-04-29: qty 2

## 2022-04-29 MED ORDER — ONDANSETRON HCL 4 MG/2ML IJ SOLN: 4.0000 mg | Freq: Four times a day (QID) | INTRAMUSCULAR | Status: DC | PRN

## 2022-04-29 MED ORDER — TRAZODONE HCL 50 MG PO TABS
50.0000 mg | ORAL_TABLET | Freq: Every evening | ORAL | Status: DC | PRN
  Administered 2022-04-30: 50 mg via ORAL
  Filled 2022-04-29: qty 1

## 2022-04-29 MED ORDER — GUAIFENESIN 100 MG/5ML PO LIQD: 5.0000 mL | ORAL | Status: DC | PRN

## 2022-04-29 MED ORDER — IPRATROPIUM-ALBUTEROL 0.5-2.5 (3) MG/3ML IN SOLN: 3.0000 mL | RESPIRATORY_TRACT | Status: DC | PRN

## 2022-04-29 MED ORDER — METOPROLOL TARTRATE 5 MG/5ML IV SOLN
5.0000 mg | INTRAVENOUS | Status: DC | PRN
  Administered 2022-04-30: 5 mg via INTRAVENOUS
  Filled 2022-04-29: qty 5

## 2022-04-29 NOTE — Progress Notes (Addendum)
PROGRESS NOTE    Holly Phelps  SWF:[id number] DOB: 03/27/1961 DOA: 04/28/2022 PCP: [facility]   Brief Narrative:  61 year old with history of HLD, hypothyroidism, GERD, CVA, HTN presented with nausea vomiting.  She also admits of drinking every evening due to stress.  Upon admission there was concerns of alcoholic versus diabetic ketoacidosis.  She was started on insulin drip initially later transitioned to subcu with IV fluids.   Assessment & Plan:  Principal Problem:   Alcoholic ketoacidosis Active Problems:   Alcohol abuse   Alcohol withdrawal (HCC)   Macrocytic anemia   Elevated LFTs   Elevated lipase   Sepsis (HCC)   Lactic acidosis   Acute metabolic encephalopathy   AKI (acute kidney injury) ([place])   History of CVA (cerebrovascular accident)   HLD (hyperlipidemia)   Hypothyroidism    Alcoholic ketoacidosis Alcohol abuse with concerns of withdrawal -Alcohol withdrawal protocol.  Thiamine, multivitamin, folic acid.  Anion gap is resolved - Getting IV fluids.  Lactic acidosis resolved.  Bicarb improved -PPI twice daily  Nausea/Vomiting -resolved. Suspect gastritis/esophagitis. Ordered PPI  SIRS -No obvious source of infection at this time.  Currently on broad-spectrum antibiotics vancomycin, cefepime and Flagyl.  Procalcitonin is negative.  We will discontinue antibiotics today - UA-negative.  Follow-up culture data. Chest x-ray is negative  Hypokalemia/hypomagnesemia/hypophosphatemia - Aggressive repletion.  Repeat labs later again today  Hyperglycemia - A1c 4.4.  Sliding scale and Accu-Chek.  Acute metabolic encephalopathy - Resolved  Chronic T12 compression fracture - Pain control  Acute kidney injury - Baseline creatinine 0.8.  Admission creatinine 1.7.  Resolved with IV fluids.  Transaminitis with elevated lipase - Resolved.  Macrocytic anemia - Folic acid and U20 normal.  Hemoglobin stable at 8.2.  History of  CVA Hyperlipidemia - Currently statin on hold due to transaminitis  Hypothyroidism -Synthroid    DVT prophylaxis: Lovenox Code Status: Full code Family Communication:  Called her husband  Status is: Inpatient Remains inpatient appropriate because: Needs aggressive iv electrolyte repletion. PO intake poor   Subjective:  Patient awake this morning reading Newspaper. Feeling better. Denies drinking but when I spoke with her husband he states she has been drinking a lot at home but recently cutting back down. She has had n/v for 2 days prior to admission.  He doesn't know how much she drinks, but thinks 6-7 shots of bourbon daily.    Examination:  General exam: Appears calm and comfortable . frail Respiratory system: Clear to auscultation. Respiratory effort normal. Cardiovascular system: S1 & S2 heard, RRR. No JVD, murmurs, rubs, gallops or clicks. No pedal edema. Gastrointestinal system: Abdomen is nondistended, soft and nontender. No organomegaly or masses felt. Normal bowel sounds heard. Central nervous system: Alert and oriented. No focal neurological deficits. Extremities: Symmetric 5 x 5 power. Skin: No rashes, lesions or ulcers Psychiatry: Judgement and insight appear normal. Mood & affect appropriate.     Objective: Vitals:   04/29/22 0313 04/29/22 0400 04/29/22 0500 04/29/22 0600  BP:  125/73 (!) 142/72 139/79  Pulse:  100 94 (!) 101  Resp:  15 15 (!) 22  Temp: 97.9 F (36.6 C)     TempSrc: Oral     SpO2:  99% 100% 100%  Weight:      Height:        Intake/Output Summary (Last 24 hours) at 04/29/2022 0732 Last data filed at 04/29/2022 0609 Gross per 24 hour  Intake 3302.66 ml  Output 1150 ml  Net 2152.66 ml  Filed Weights   04/28/22 1333  Weight: 49.9 kg     Data Reviewed:   CBC: Recent Labs  Lab 04/28/22 1058 04/29/22 0301  WBC 11.8* 9.9  NEUTROABS 10.4*  --   HGB 11.0* 8.2*  HCT 33.6* 24.1*  MCV 108.4* 107.6*  PLT 429* 810   Basic  Metabolic Panel: Recent Labs  Lab 04/28/22 1030 04/28/22 1511 04/29/22 0301  NA 144 143 143  K 3.6 3.8 2.6*  CL 96* 106 106  CO2 14* 14* 28  GLUCOSE 240* 142* 126*  BUN 30* 26* 16  CREATININE 1.74* 1.24* 0.81  CALCIUM 9.9 8.0* 8.4*  MG 1.3*  --  1.1*  PHOS 4.4  --  <1.0*   GFR: Estimated Creatinine Clearance: 57.5 mL/min (by C-G formula based on SCr of 0.81 mg/dL). Liver Function Tests: Recent Labs  Lab 04/28/22 1030 04/29/22 0301  AST 134* 84*  ALT 65* 42  ALKPHOS 240* 164*  BILITOT 2.3* 0.8  PROT 9.6* 6.7  ALBUMIN 5.2* 3.5   Recent Labs  Lab 04/28/22 1030  LIPASE 196*   No results for input(s): "AMMONIA" in the last 168 hours. Coagulation Profile: Recent Labs  Lab 04/29/22 0301  INR 1.0   Cardiac Enzymes: No results for input(s): "CKTOTAL", "CKMB", "CKMBINDEX", "TROPONINI" in the last 168 hours. BNP (last 3 results) No results for input(s): "PROBNP" in the last 8760 hours. HbA1C: Recent Labs    04/28/22 1658  HGBA1C 4.4*   CBG: Recent Labs  Lab 04/28/22 1334 04/28/22 1735 04/28/22 2009 04/28/22 2325 04/29/22 0311  GLUCAP 160* 132* 95 140* 135*   Lipid Profile: No results for input(s): "CHOL", "HDL", "LDLCALC", "TRIG", "CHOLHDL", "LDLDIRECT" in the last 72 hours. Thyroid Function Tests: No results for input(s): "TSH", "T4TOTAL", "FREET4", "T3FREE", "THYROIDAB" in the last 72 hours. Anemia Panel: Recent Labs    04/28/22 1855  VITAMINB12 827  FOLATE 33.2   Sepsis Labs: Recent Labs  Lab 04/28/22 1058 04/28/22 1232 04/28/22 1855 04/28/22 2151 04/29/22 0301  PROCALCITON  --   --  <0.10  --  <0.10  LATICACIDVEN 2.1* 3.4* 1.4 1.3  --     Recent Results (from the past 240 hour(s))  MRSA Next Gen by PCR, Nasal     Status: None   Collection Time: 04/29/22 12:30 AM   Specimen: Nasal Mucosa; Nasal Swab  Result Value Ref Range Status   MRSA by PCR Next Gen NOT DETECTED NOT DETECTED Final    Comment: (NOTE) The GeneXpert MRSA Assay (FDA  approved for NASAL specimens only), is one component of a comprehensive MRSA colonization surveillance program. It is not intended to diagnose MRSA infection nor to guide or monitor treatment for MRSA infections. Test performance is not FDA approved in patients less than 71 years old. Performed at [facility], [place] [address] [place],  [address]          Radiology Studies: CT ABDOMEN PELVIS W CONTRAST  Result Date: 04/28/2022 CLINICAL DATA:  Abdominal pain, vomiting EXAM: CT ABDOMEN AND PELVIS WITH CONTRAST TECHNIQUE: Multidetector CT imaging of the abdomen and pelvis was performed using the standard protocol following bolus administration of intravenous contrast. RADIATION DOSE REDUCTION: This exam was performed according to the departmental dose-optimization program which includes automated exposure control, adjustment of the mA and/or kV according to patient size and/or use of iterative reconstruction technique. CONTRAST:  61m OMNIPAQUE IOHEXOL 300 MG/ML  SOLN COMPARISON:  None Available. FINDINGS: Lower chest: Lower lung fields are clear. Hepatobiliary:  There is fatty infiltration in the liver. No focal abnormality is seen. There is no dilation of bile ducts. Gallbladder is not distended. Pancreas: No focal abnormality is seen. Spleen: Unremarkable. Adrenals/Urinary Tract: Adrenals are unremarkable. There is no hydronephrosis. There are no renal or ureteral stones. Evaluation of renal cortex is somewhat limited due to motion artifacts. Urinary bladder is unremarkable. Stomach/Bowel: Small hiatal hernia is seen. There is wall thickening in the lower thoracic esophagus. Stomach is not distended. Small bowel loops are not dilated. Appendix is not seen. Scattered diverticula are seen in the colon without signs of focal acute diverticulitis. Vascular/Lymphatic: Atherosclerotic plaques and calcifications are seen in the aorta and its major branches. Reproductive:  Unremarkable. Other: There is no ascites or pneumoperitoneum. Musculoskeletal: There is compression fracture in the upper endplate of body of K53 vertebra. There is retropulsion of upper endplate of body of Z76 vertebra causing extrinsic pressure over the ventral margin of thecal sac and spinal stenosis. IMPRESSION: There is no evidence intestinal obstruction or pneumoperitoneum. There is no hydronephrosis. Small hiatal hernia. There is wall thickening in the lower thoracic esophagus suggesting possible reflux esophagitis. There is compression fracture in the upper endplate of body of B34 vertebra with 50% decrease in height. This may be recent or old. Please correlate with clinical symptoms. There is retropulsion of upper endplate of body of L93 vertebra causing extrinsic pressure over the ventral margin of thecal sac and spinal stenosis. Fatty liver.  Diverticulosis of colon. Other findings as described in the body of the report. Electronically Signed   By: Elmer Picker M.D.   On: 04/28/2022 12:24   DG Chest 2 View  Result Date: 04/28/2022 CLINICAL DATA:  Vomiting. EXAM: CHEST - 2 VIEW COMPARISON:  None Available. FINDINGS: Cardiac silhouette is normal in size and configuration. Normal mediastinal and hilar contours. Clear lungs.  No pleural effusion or pneumothorax. Mild compression deformity of T12, presumed chronic unless this patient has new back pain. Remainder of the visualized osseous structures are intact. IMPRESSION: 1. No active cardiopulmonary disease. 2. Mild compression fracture of T12, most likely chronic. Clinical correlation warranted. Electronically Signed   By: Lajean Manes M.D.   On: 04/28/2022 11:43        Scheduled Meds:  Chlorhexidine Gluconate Cloth  6 each Topical Daily   enoxaparin (LOVENOX) injection  40 mg Subcutaneous QHS   insulin aspart  0-15 Units Subcutaneous Q4H   levothyroxine  25 mcg Oral QAC breakfast   LORazepam  0-4 mg Intravenous Q6H   Or   LORazepam   0-4 mg Oral Q6H   [START ON 04/30/2022] LORazepam  0-4 mg Intravenous Q12H   Or   [START ON 04/30/2022] LORazepam  0-4 mg Oral Q12H   Continuous Infusions:  ceFEPime (MAXIPIME) IV     dextrose 5% lactated ringers 125 mL/hr at 04/29/22 0609   magnesium sulfate bolus IVPB     metronidazole Stopped (04/28/22 2129)   potassium PHOSPHATE IVPB (in mmol) 85 mL/hr at 04/29/22 0609   thiamine injection Stopped (04/29/22 0609)   [START ON 04/30/2022] vancomycin       LOS: 1 day   Time spent= 35 mins    Green Quincy Arsenio Loader, MD [facility]  If 7PM-7AM, please contact night-coverage  04/29/2022, 7:32 AM

## 2022-04-29 NOTE — Progress Notes (Signed)
Pt has arrived to Room 1520 from ICU. Pt alert and oriented. Without c/o. Glasses on. Cell phone at bedside.

## 2022-04-29 NOTE — Plan of Care (Signed)
Cross coverage note  Critical phosphate less than 1 Potassium 2.6  Ordered potassium phosphate and K2

## 2022-04-30 ENCOUNTER — Encounter (HOSPITAL_COMMUNITY): Payer: Self-pay | Admitting: Internal Medicine

## 2022-04-30 DIAGNOSIS — E440 Moderate protein-calorie malnutrition: Secondary | ICD-10-CM | POA: Insufficient documentation

## 2022-04-30 DIAGNOSIS — E8729 Other acidosis: Secondary | ICD-10-CM | POA: Diagnosis not present

## 2022-04-30 LAB — GLUCOSE, CAPILLARY
Glucose-Capillary: 101 mg/dL — ABNORMAL HIGH (ref 70–99)
Glucose-Capillary: 107 mg/dL — ABNORMAL HIGH (ref 70–99)
Glucose-Capillary: 130 mg/dL — ABNORMAL HIGH (ref 70–99)
Glucose-Capillary: 137 mg/dL — ABNORMAL HIGH (ref 70–99)
Glucose-Capillary: 89 mg/dL (ref 70–99)
Glucose-Capillary: 95 mg/dL (ref 70–99)
Glucose-Capillary: 99 mg/dL (ref 70–99)

## 2022-04-30 LAB — COMPREHENSIVE METABOLIC PANEL
ALT: 46 U/L — ABNORMAL HIGH (ref 0–44)
AST: 99 U/L — ABNORMAL HIGH (ref 15–41)
Albumin: 3.6 g/dL (ref 3.5–5.0)
Alkaline Phosphatase: 191 U/L — ABNORMAL HIGH (ref 38–126)
Anion gap: 9 (ref 5–15)
BUN: 7 mg/dL — ABNORMAL LOW (ref 8–23)
CO2: 26 mmol/L (ref 22–32)
Calcium: 8.2 mg/dL — ABNORMAL LOW (ref 8.9–10.3)
Chloride: 104 mmol/L (ref 98–111)
Creatinine, Ser: 0.67 mg/dL (ref 0.44–1.00)
GFR, Estimated: >60 mL/min (ref >60)
Glucose, Bld: 102 mg/dL — ABNORMAL HIGH (ref 70–99)
Potassium: 3.7 mmol/L (ref 3.5–5.1)
Sodium: 139 mmol/L (ref 135–145)
Total Bilirubin: 1 mg/dL (ref 0.3–1.2)
Total Protein: 6.6 g/dL (ref 6.5–8.1)

## 2022-04-30 LAB — CBC
HCT: 25.7 % — ABNORMAL LOW (ref 36.0–46.0)
Hemoglobin: 8.3 g/dL — ABNORMAL LOW (ref 12.0–15.0)
MCH: 35.6 pg — ABNORMAL HIGH (ref 26.0–34.0)
MCHC: 32.3 g/dL (ref 30.0–36.0)
MCV: 110.3 fL — ABNORMAL HIGH (ref 80.0–100.0)
Platelets: 223 10*3/uL (ref 150–400)
RBC: 2.33 MIL/uL — ABNORMAL LOW (ref 3.87–5.11)
RDW: 13 % (ref 11.5–15.5)
WBC: 7.4 10*3/uL (ref 4.0–10.5)
nRBC: 0 % (ref 0.0–0.2)

## 2022-04-30 LAB — MAGNESIUM: Magnesium: 1.9 mg/dL (ref 1.7–2.4)

## 2022-04-30 LAB — PHOSPHORUS: Phosphorus: 1.5 mg/dL — ABNORMAL LOW (ref 2.5–4.6)

## 2022-04-30 LAB — PROCALCITONIN: Procalcitonin: <0.1 ng/mL

## 2022-04-30 MED ORDER — LORAZEPAM 2 MG/ML IJ SOLN
0.0000 mg | Freq: Four times a day (QID) | INTRAMUSCULAR | Status: DC
  Administered 2022-04-30 – 2022-05-01 (×3): 2 mg via INTRAVENOUS
  Filled 2022-04-30 (×4): qty 1

## 2022-04-30 MED ORDER — LORAZEPAM 1 MG PO TABS
0.0000 mg | ORAL_TABLET | Freq: Four times a day (QID) | ORAL | Status: DC
  Filled 2022-04-30: qty 2
  Filled 2022-04-30: qty 1

## 2022-04-30 MED ORDER — ENSURE ENLIVE PO LIQD
237.0000 mL | Freq: Two times a day (BID) | ORAL | Status: DC
  Administered 2022-04-30 – 2022-05-03 (×4): 237 mL via ORAL

## 2022-04-30 MED ORDER — ADULT MULTIVITAMIN W/MINERALS CH
1.0000 | ORAL_TABLET | Freq: Every day | ORAL | Status: DC
  Administered 2022-05-01 – 2022-05-03 (×3): 1 via ORAL
  Filled 2022-04-30 (×3): qty 1

## 2022-04-30 MED ORDER — POTASSIUM PHOSPHATES 15 MMOLE/5ML IV SOLN
30.0000 mmol | Freq: Once | INTRAVENOUS | Status: AC
  Administered 2022-04-30: 30 mmol via INTRAVENOUS
  Filled 2022-04-30: qty 10

## 2022-04-30 NOTE — Progress Notes (Signed)
PROGRESS NOTE    Holly Phelps  IHW:[id number] DOB: [birth year] DOA: 04/28/2022 PCP: [facility]   Brief Narrative:  61 year old with history of HLD, hypothyroidism, GERD, CVA, HTN presented with nausea vomiting.  She also admits of drinking every evening due to stress.  Upon admission there was concerns of alcoholic versus diabetic ketoacidosis.  She was started on insulin drip initially later transitioned to subcu with IV fluids.  Her p.o. intake is extremely poor   Assessment & Plan:  Principal Problem:   Alcoholic ketoacidosis Active Problems:   Alcohol abuse   Alcohol withdrawal (HCC)   Macrocytic anemia   Elevated LFTs   Elevated lipase   Sepsis (HCC)   Lactic acidosis   Acute metabolic encephalopathy   AKI (acute kidney injury) ([place])   History of CVA (cerebrovascular accident)   HLD (hyperlipidemia)   Hypothyroidism    Alcoholic ketoacidosis Alcohol abuse with concerns of withdrawal -Alcohol withdrawal protocol.  Thiamine, multivitamin, folic acid.  Anion gap is resolved - Bicarb is improved, lactic acidosis has resolved. -PPI twice daily  Nausea/Vomiting -Nausea vomiting improved suspect gastritis/esophagitis.  She is on PPI twice daily.  Her p.o. intake is extremely poor at this time.  SIRS - No source of infection, initially received Vanco/cefepime and Flagyl, Pro-Cal negative.  Antibiotics discontinued. - UA-negative.  Follow-up culture data. Chest x-ray is negative  Hypokalemia/hypomagnesemia/hypophosphatemia - Aggressive repletion as necessary aggressive repletion.  Repeat labs later again today  Hyperglycemia - A1c 4.4.  Sliding scale and Accu-Chek.  Acute metabolic encephalopathy - Resolved  Chronic T12 compression fracture - Pain control  Acute kidney injury - Baseline creatinine 0.8.  Admission creatinine 1.7.  Resolved with IV fluids.  Transaminitis with elevated lipase - Resolved.  Macrocytic anemia - Folic acid and  K91 normal.  Hemoglobin stable at 8.2.  History of CVA Hyperlipidemia - Currently statin on hold due to transaminitis  Hypothyroidism -Synthroid    DVT prophylaxis: Lovenox Code Status: Full code Family Communication:    Status is: Inpatient Remains inpatient appropriate because: Oral intake is still extremely poor with some stomach irritation.  Currently on PPI, slowly encourage diet as tolerated. Clinically she appears very dehydrated clinically  Subjective:  Patient denies any complaints this morning but tells me yesterday she barely ate anything which was confirmed with nursing staff.  She will try and eat a little bit more today.    Examination:  Constitutional: Not in acute distress Respiratory: Clear to auscultation bilaterally Cardiovascular: Normal sinus rhythm, no rubs Abdomen: Nontender nondistended good bowel sounds Musculoskeletal: No edema noted Skin: No rashes seen Neurologic: CN 2-12 grossly intact.  And nonfocal Psychiatric: Normal judgment and insight. Alert and oriented x 3. Normal mood.     Objective: Vitals:   04/30/22 0418 04/30/22 0600 04/30/22 0624 04/30/22 1018  BP: (!) 142/92 120/85 120/85 (!) 142/100  Pulse: (!) 108 99 99 (!) 110  Resp: '19  18 18  '$ Temp: 99.4 F (37.4 C)  98.9 F (37.2 C) 99.1 F (37.3 C)  TempSrc: Oral  Oral Oral  SpO2: 100%  98% 100%  Weight: 46.3 kg     Height:        Intake/Output Summary (Last 24 hours) at 04/30/2022 1055 Last data filed at 04/30/2022 0600 Gross per 24 hour  Intake 652.41 ml  Output 1000 ml  Net -347.59 ml   Filed Weights   04/28/22 1333 04/30/22 0418  Weight: 49.9 kg 46.3 kg     Data Reviewed:  CBC: Recent Labs  Lab 04/28/22 1058 04/29/22 0301 04/30/22 0455  WBC 11.8* 9.9 7.4  NEUTROABS 10.4*  --   --   HGB 11.0* 8.2* 8.3*  HCT 33.6* 24.1* 25.7*  MCV 108.4* 107.6* 110.3*  PLT 429* 222 283   Basic Metabolic Panel: Recent Labs  Lab 04/28/22 1030 04/28/22 1511 04/29/22 0301  04/29/22 1003 04/29/22 1332 04/30/22 0455  NA 144 143 143 138  --  139  K 3.6 3.8 2.6* 3.3* 3.1* 3.7  CL 96* 106 106 102  --  104  CO2 14* 14* 28 27  --  26  GLUCOSE 240* 142* 126* 103*  --  102*  BUN 30* 26* 16 11  --  7*  CREATININE 1.74* 1.24* 0.81 0.71  --  0.67  CALCIUM 9.9 8.0* 8.4* 7.8*  --  8.2*  MG 1.3*  --  1.1* 1.8 2.7* 1.9  PHOS 4.4  --  <1.0* 2.9 3.1 1.5*   GFR: Estimated Creatinine Clearance: 54 mL/min (by C-G formula based on SCr of 0.67 mg/dL). Liver Function Tests: Recent Labs  Lab 04/28/22 1030 04/29/22 0301 04/29/22 1003 04/30/22 0455  AST 134* 84*  --  99*  ALT 65* 42  --  46*  ALKPHOS 240* 164*  --  191*  BILITOT 2.3* 0.8  --  1.0  PROT 9.6* 6.7  --  6.6  ALBUMIN 5.2* 3.5 3.2* 3.6   Recent Labs  Lab 04/28/22 1030  LIPASE 196*   No results for input(s): "AMMONIA" in the last 168 hours. Coagulation Profile: Recent Labs  Lab 04/29/22 0301  INR 1.0   Cardiac Enzymes: No results for input(s): "CKTOTAL", "CKMB", "CKMBINDEX", "TROPONINI" in the last 168 hours. BNP (last 3 results) No results for input(s): "PROBNP" in the last 8760 hours. HbA1C: Recent Labs    04/28/22 1658  HGBA1C 4.4*   CBG: Recent Labs  Lab 04/29/22 1754 04/29/22 2033 04/30/22 0041 04/30/22 0415 04/30/22 0803  GLUCAP 101* 98 99 95 101*   Lipid Profile: No results for input(s): "CHOL", "HDL", "LDLCALC", "TRIG", "CHOLHDL", "LDLDIRECT" in the last 72 hours. Thyroid Function Tests: No results for input(s): "TSH", "T4TOTAL", "FREET4", "T3FREE", "THYROIDAB" in the last 72 hours. Anemia Panel: Recent Labs    04/28/22 1855  VITAMINB12 827  FOLATE 33.2   Sepsis Labs: Recent Labs  Lab 04/28/22 1058 04/28/22 1232 04/28/22 1855 04/28/22 2151 04/29/22 0301 04/30/22 0455  PROCALCITON  --   --  <0.10  --  <0.10 <0.10  LATICACIDVEN 2.1* 3.4* 1.4 1.3  --   --     Recent Results (from the past 240 hour(s))  Blood culture (routine x 2)     Status: None (Preliminary  result)   Collection Time: 04/28/22 10:28 AM   Specimen: BLOOD  Result Value Ref Range Status   Specimen Description   Final    BLOOD BLOOD RIGHT ARM Performed at [facility] [address] [place], [place] [address]    Special Requests   Final    BOTTLES DRAWN AEROBIC AND ANAEROBIC Blood Culture adequate volume Performed at [facility] [address], [place], [place] [address]    Culture   Final    NO GROWTH 2 DAYS Performed at [facility] [address] [place], [place] [address]    Report Status PENDING  Incomplete  Blood culture (routine x 2)     Status: None (Preliminary result)   Collection Time: 04/28/22 10:48 AM  Specimen: BLOOD  Result Value Ref Range Status   Specimen Description   Final    BLOOD BLOOD RIGHT HAND Performed at [facility] [address] [place], [place] [address]    Special Requests   Final    BOTTLES DRAWN AEROBIC AND ANAEROBIC Blood Culture results may not be optimal due to an excessive volume of blood received in culture bottles Performed at [facility] [address] [place], [place] [address]    Culture   Final    NO GROWTH 2 DAYS Performed at [facility] [address] [place], [place] [address]    Report Status PENDING  Incomplete  MRSA Next Gen by PCR, Nasal     Status: None   Collection Time: 04/29/22 12:30 AM   Specimen: Nasal Mucosa; Nasal Swab  Result Value Ref Range Status   MRSA by PCR Next Gen NOT DETECTED NOT DETECTED Final    Comment: (NOTE) The GeneXpert MRSA Assay (FDA approved for NASAL specimens only), is one component of a comprehensive MRSA colonization surveillance program. It is not intended to diagnose MRSA infection nor to guide or monitor treatment for MRSA infections. Test performance is not FDA approved in patients less than 48 years old. Performed at [facility], [place] [address] [place], [place] [address]          Radiology Studies: CT ABDOMEN PELVIS W CONTRAST  Result Date: 04/28/2022 CLINICAL DATA:  Abdominal pain, vomiting EXAM: CT ABDOMEN AND PELVIS WITH CONTRAST TECHNIQUE: Multidetector CT imaging of the abdomen and pelvis was performed using the standard protocol following bolus administration of intravenous contrast. RADIATION DOSE REDUCTION: This exam was performed according to the departmental dose-optimization program which includes automated exposure control, adjustment of the mA and/or kV according to patient size and/or use of iterative reconstruction technique. CONTRAST:  53m OMNIPAQUE IOHEXOL 300 MG/ML  SOLN COMPARISON:  None Available. FINDINGS: Lower chest: Lower lung fields are clear. Hepatobiliary: There is fatty infiltration in the liver. No focal abnormality is seen. There is no dilation of bile ducts. Gallbladder is not distended. Pancreas: No focal abnormality is seen. Spleen: Unremarkable. Adrenals/Urinary Tract: Adrenals are unremarkable. There is no hydronephrosis. There are no renal or ureteral stones. Evaluation of renal cortex is somewhat limited due to motion artifacts. Urinary bladder is unremarkable. Stomach/Bowel: Small hiatal hernia is seen. There is wall thickening in the lower thoracic esophagus. Stomach is not distended. Small bowel loops are not dilated. Appendix is not seen. Scattered diverticula are seen in the colon without signs of focal acute diverticulitis. Vascular/Lymphatic: Atherosclerotic plaques and calcifications are seen in the aorta and its major branches. Reproductive: Unremarkable. Other: There is no ascites or pneumoperitoneum. Musculoskeletal: There is compression fracture in the upper endplate of body of TP59vertebra. There is retropulsion of upper endplate of body of TF63vertebra causing extrinsic pressure over the ventral margin of thecal sac and spinal stenosis. IMPRESSION: There is no evidence intestinal obstruction  or pneumoperitoneum. There is no hydronephrosis. Small hiatal hernia. There is wall thickening in the lower thoracic esophagus suggesting possible reflux esophagitis. There is compression fracture in the upper endplate of body of TW46vertebra with 50% decrease in height. This may be recent or old. Please correlate with clinical symptoms. There is retropulsion of upper endplate of body of TK59vertebra causing extrinsic pressure over the ventral margin of thecal sac and spinal stenosis. Fatty liver.  Diverticulosis of colon. Other findings as described in  the body of the report. Electronically Signed   By: Elmer Picker M.D.   On: 04/28/2022 12:24   DG Chest 2 View  Result Date: 04/28/2022 CLINICAL DATA:  Vomiting. EXAM: CHEST - 2 VIEW COMPARISON:  None Available. FINDINGS: Cardiac silhouette is normal in size and configuration. Normal mediastinal and hilar contours. Clear lungs.  No pleural effusion or pneumothorax. Mild compression deformity of T12, presumed chronic unless this patient has new back pain. Remainder of the visualized osseous structures are intact. IMPRESSION: 1. No active cardiopulmonary disease. 2. Mild compression fracture of T12, most likely chronic. Clinical correlation warranted. Electronically Signed   By: Lajean Manes M.D.   On: 04/28/2022 11:43        Scheduled Meds:  enoxaparin (LOVENOX) injection  40 mg Subcutaneous QHS   insulin aspart  0-15 Units Subcutaneous Q4H   levothyroxine  25 mcg Oral QAC breakfast   LORazepam  0-4 mg Intravenous Q12H   Or   LORazepam  0-4 mg Oral Q12H   LORazepam  0-4 mg Intravenous Q6H   Or   LORazepam  0-4 mg Oral Q6H   pantoprazole  40 mg Oral BID   Continuous Infusions:  potassium PHOSPHATE IVPB (in mmol)     thiamine injection 500 mg (04/30/22 0423)     LOS: 2 days   Time spent= 35 mins    Ayianna Darnold Arsenio Loader, MD [facility]  If 7PM-7AM, please contact night-coverage  04/30/2022, 10:55 AM

## 2022-04-30 NOTE — Evaluation (Signed)
Clinical/Bedside Swallow Evaluation Patient Details  Name: Holly Phelps MRN: [id number] Date of Birth: 1961/07/10  Today's Date: 04/30/2022 Time: SLP Start Time (ACUTE ONLY): 1350 SLP Stop Time (ACUTE ONLY): 1410 SLP Time Calculation (min) (ACUTE ONLY): 20 min  Past Medical History: History reviewed. No pertinent past medical history. Past Surgical History: History reviewed. No pertinent surgical history. HPI:  Patient is a 61 y.o. female with PMH: HLD, hypothyroidism, GERD, CVA, HTN who presented to the hospital on 04/28/22 with nausea and vomiting. History was provided by patient's husband who reported that patient has been vomiting fairly consistently over the past two days and she has not been able to keep anything down, has had no appetite for past week or more and she typically drinks 4-5 ounces of liquor nightly but may have increased this due to stress. (no alcohol consumption in past 2 days). She was admitted with alcoholic versus diabetic ketoacidosis. CXR negative, CT abdomen and pelvis showed small hiatal hernia.    Assessment / Plan / Recommendation  Clinical Impression  Patient presents with what appears to be a primary cognitive-based dysphagia but cannot r/o impact from h/o GERD and h/o CVA. Per RN, patient has not had any sedating medications since previous night. RN was concerned as patient was having significant difficulty earlier today with what sounded like discoordination when patient drinking liquids leading to coughing. When SLP entered room, patient was awake but lethargic, eyes mainly closed and verbal responses and physical actions were slow. With setup assistance, patient able to hold 1/4 piece of sandwich and feed herself a couple bites as well as hold a cup and drink water via straw sips. She had brief cough after first sip of water but no further coughing or throat clearing. Oral phase appeared delayed and questionable delay in swallow initiation. As patient seems to be  presenting with a reversible, primarily cognitive-based dysphagia, SLP is not recommending any changes to diet consistencies at this time but recommending supervision with meals, assistance as needed and to ensure patient is alert when giving PO's. SLP will f/u at least one time to ensure diet toleration. SLP Visit Diagnosis: Dysphagia, unspecified (R13.10)    Aspiration Risk  Mild aspiration risk    Diet Recommendation Regular;Thin liquid   Liquid Administration via: Straw;Cup Medication Administration: Crushed with puree Supervision: Staff to assist with self feeding;Patient able to self feed;Full supervision/cueing for compensatory strategies Compensations: Minimize environmental distractions;Slow rate;Small sips/bites Postural Changes: Seated upright at 90 degrees    Other  Recommendations Oral Care Recommendations: Oral care BID;Staff/trained caregiver to provide oral care    Recommendations for follow up therapy are one component of a multi-disciplinary discharge planning process, led by the attending physician.  Recommendations may be updated based on patient status, additional functional criteria and insurance authorization.  Follow up Recommendations No SLP follow up      Assistance Recommended at Discharge Intermittent Supervision/Assistance  Functional Status Assessment Patient has had a recent decline in their functional status and demonstrates the ability to make significant improvements in function in a reasonable and predictable amount of time.  Frequency and Duration min 1 x/week          Prognosis Prognosis for Safe Diet Advancement: Good Barriers to Reach Goals: Cognitive deficits      Swallow Study   General Date of Onset: 04/28/22 HPI: Patient is a 61 y.o. female with PMH: HLD, hypothyroidism, GERD, CVA, HTN who presented to the hospital on 04/28/22 with nausea and vomiting. History was provided  by patient's husband who reported that patient has been vomiting  fairly consistently over the past two days and she has not been able to keep anything down, has had no appetite for past week or more and she typically drinks 4-5 ounces of liquor nightly but may have increased this due to stress. (no alcohol consumption in past 2 days). She was admitted with alcoholic versus diabetic ketoacidosis. CXR negative, CT abdomen and pelvis showed small hiatal hernia. Type of Study: Bedside Swallow Evaluation Previous Swallow Assessment: none found Diet Prior to this Study: Regular;Thin liquids Temperature Spikes Noted: No Respiratory Status: Room air History of Recent Intubation: No Behavior/Cognition: Alert;Cooperative;Requires cueing;Lethargic/Drowsy Oral Cavity Assessment: Within Functional Limits Oral Care Completed by SLP: No Oral Cavity - Dentition: Adequate natural dentition Vision: Impaired for self-feeding Self-Feeding Abilities: Needs assist;Needs set up Patient Positioning: Upright in bed Baseline Vocal Quality: Normal Volitional Cough: Cognitively unable to elicit Volitional Swallow: Unable to elicit    Oral/Motor/Sensory Function Overall Oral Motor/Sensory Function: Generalized oral weakness Facial ROM: Within Functional Limits Facial Symmetry: Within Functional Limits Facial Strength: Reduced right;Reduced left Lingual ROM: Within Functional Limits Lingual Strength: Reduced   Ice Chips     Thin Liquid Thin Liquid: Impaired Presentation: Straw Oral Phase Impairments: Poor awareness of bolus Pharyngeal  Phase Impairments: Other (comments) Other Comments: one brief cough after first sip of thin liquids (water but no further coughing or throat clearing observed)    Nectar Thick     Honey Thick     Puree Puree: Not tested   Solid     Solid: Impaired Oral Phase Impairments: Poor awareness of bolus;Impaired mastication Oral Phase Functional Implications: Prolonged oral transit;Impaired mastication     Sonia Baller, MA, CCC-SLP Speech  Therapy

## 2022-04-30 NOTE — Progress Notes (Signed)
Noticed patient's heart rate spiking up in the 120's on the monitor, upon checking on patient she was having visual and auditory hallucinations of a dog in the room, also thought she was in a hotel, her anxiety and restlessness were also increased from prior CIWA evaluation. Current ciwa score noted to be at 15 and her elevated HR triggered a yellow mews. Upon checking a full set of vitals she returned to a green mews but yellow mews protocol is being initiated due to fluctuating heart rate.  On-call provider was notified and no new orders given. Patient was medicated with Ativan per mar. Will continue to monitor.

## 2022-04-30 NOTE — Plan of Care (Signed)
  Problem: Coping: Goal: Ability to adjust to condition or change in health will improve Outcome: Progressing   Problem: Safety: Goal: Ability to remain free from injury will improve Outcome: Progressing   

## 2022-04-30 NOTE — Progress Notes (Signed)
Initial Nutrition Assessment  DOCUMENTATION CODES:   Non-severe (moderate) malnutrition in context of chronic illness  INTERVENTION:   Monitor magnesium, potassium, and phosphorus BID for at least 3 days, MD to replete as needed, as pt is at risk for refeeding syndrome.  -Ensure Plus High Protein po BID, each supplement provides 350 kcal and 20 grams of protein.   -Multivitamin with minerals daily  -Given alcohol abuse, will check the following labs: Vitamin D, Vitamin B-6, C, and Zinc  -If pt is unable to pass swallow evaluation, will need to consider placement of small bore feeding tube and nutrition support initiation.  NUTRITION DIAGNOSIS:   Moderate Malnutrition related to chronic illness (alcohol abuse) as evidenced by moderate fat depletion, severe muscle depletion.  GOAL:   Patient will meet greater than or equal to 90% of their needs  MONITOR:   PO intake, Supplement acceptance, Labs, Weight trends, I & O's  REASON FOR ASSESSMENT:   Consult Assessment of nutrition requirement/status  ASSESSMENT:   61 y.o. female with medical history significant of HLD, hypothyroidism, GERD, CVA, HTN. Presenting with nausea/vomiting.  Patient in room, no family at bedside. Pt very lethargic and was not able to provide much history. Pt states she has not eaten today, no appetite for a long time. Pt was having N/V for 2 days PTA. Has been consuming alcohol daily, (6-7 shots of bourbon). Per RN, pt is having difficulty swallowing her medications. Has requested a SLP evaluation. Suspect pt has not been eating well for a while and this is likely difficult with the amount of alcohol she consumes.   Will add daily MVI and Ensure supplements. Pt requests chocolate. If pt is unable to pass swallow evaluation, will need to consider placement of small bore feeding tube and nutrition support initiation.  Given extent of alcohol abuse, pt at risk of multiple micronutrient deficiencies. Will  check Vitamin D, B-6, C and zinc levels.   Per weight records, pt weighed 104 lbs on 3/20. Current weight: 102 lbs.  Medications: K-Phos, IV Thiamine  Labs reviewed:  CBGs: 95-107 Low Phos Folate: WNL Vit B-12 WNL  NUTRITION - FOCUSED PHYSICAL EXAM:  Flowsheet Row Most Recent Value  Orbital Region Mild depletion  Upper Arm Region Moderate depletion  Thoracic and Lumbar Region Unable to assess  Buccal Region Severe depletion  Temple Region Moderate depletion  Clavicle Bone Region Mild depletion  Clavicle and Acromion Bone Region No depletion  Scapular Bone Region No depletion  Dorsal Hand Severe depletion  Patellar Region Severe depletion  Anterior Thigh Region Severe depletion  Posterior Calf Region Severe depletion  Edema (RD Assessment) None  Hair Reviewed  Eyes Unable to assess  [wouldn't keep eyes open]  Mouth Reviewed  Skin Reviewed  [pale]  Nails Reviewed       Diet Order:   Diet Order             Diet regular Room service appropriate? Yes; Fluid consistency: Thin  Diet effective now                   EDUCATION NEEDS:   Not appropriate for education at this time  Skin:  Skin Assessment: Reviewed RN Assessment  Last BM:  Pt reports a week ago  Height:   Ht Readings from Last 1 Encounters:  04/28/22 '5\' 2"'$  (1.575 m)    Weight:   Wt Readings from Last 1 Encounters:  04/30/22 46.3 kg   BMI:  Body mass index is 18.67  kg/m.  Estimated Nutritional Needs:   Kcal:  1600-1800  Protein:  75-90g  Fluid:  1.8L/day   Clayton Bibles, MS, RD, LDN Inpatient Clinical Dietitian Contact information available via Amion

## 2022-05-01 DIAGNOSIS — E8729 Other acidosis: Secondary | ICD-10-CM | POA: Diagnosis not present

## 2022-05-01 LAB — COMPREHENSIVE METABOLIC PANEL WITH GFR
ALT: 51 U/L — ABNORMAL HIGH (ref 0–44)
AST: 96 U/L — ABNORMAL HIGH (ref 15–41)
Albumin: 3.8 g/dL (ref 3.5–5.0)
Alkaline Phosphatase: 204 U/L — ABNORMAL HIGH (ref 38–126)
Anion gap: 8 (ref 5–15)
BUN: 8 mg/dL (ref 8–23)
CO2: 25 mmol/L (ref 22–32)
Calcium: 8.3 mg/dL — ABNORMAL LOW (ref 8.9–10.3)
Chloride: 104 mmol/L (ref 98–111)
Creatinine, Ser: 0.66 mg/dL (ref 0.44–1.00)
GFR, Estimated: >60 mL/min
Glucose, Bld: 94 mg/dL (ref 70–99)
Potassium: 3.6 mmol/L (ref 3.5–5.1)
Sodium: 137 mmol/L (ref 135–145)
Total Bilirubin: 0.9 mg/dL (ref 0.3–1.2)
Total Protein: 7 g/dL (ref 6.5–8.1)

## 2022-05-01 LAB — CBC
HCT: 26.2 % — ABNORMAL LOW (ref 36.0–46.0)
Hemoglobin: 8.6 g/dL — ABNORMAL LOW (ref 12.0–15.0)
MCH: 35.8 pg — ABNORMAL HIGH (ref 26.0–34.0)
MCHC: 32.8 g/dL (ref 30.0–36.0)
MCV: 109.2 fL — ABNORMAL HIGH (ref 80.0–100.0)
Platelets: 223 10*3/uL (ref 150–400)
RBC: 2.4 MIL/uL — ABNORMAL LOW (ref 3.87–5.11)
RDW: 12.6 % (ref 11.5–15.5)
WBC: 6.2 10*3/uL (ref 4.0–10.5)
nRBC: 0 % (ref 0.0–0.2)

## 2022-05-01 LAB — GLUCOSE, CAPILLARY
Glucose-Capillary: 108 mg/dL — ABNORMAL HIGH (ref 70–99)
Glucose-Capillary: 108 mg/dL — ABNORMAL HIGH (ref 70–99)
Glucose-Capillary: 117 mg/dL — ABNORMAL HIGH (ref 70–99)
Glucose-Capillary: 95 mg/dL (ref 70–99)
Glucose-Capillary: 96 mg/dL (ref 70–99)
Glucose-Capillary: 96 mg/dL (ref 70–99)

## 2022-05-01 LAB — MAGNESIUM: Magnesium: 1.7 mg/dL (ref 1.7–2.4)

## 2022-05-01 LAB — PHOSPHORUS: Phosphorus: 2.8 mg/dL (ref 2.5–4.6)

## 2022-05-01 LAB — VITAMIN D 25 HYDROXY (VIT D DEFICIENCY, FRACTURES): Vit D, 25-Hydroxy: 63.96 ng/mL (ref 30–100)

## 2022-05-01 MED ORDER — HALOPERIDOL LACTATE 5 MG/ML IJ SOLN: 2.0000 mg | Freq: Four times a day (QID) | INTRAMUSCULAR | Status: DC | PRN

## 2022-05-01 MED ORDER — METOPROLOL TARTRATE 25 MG PO TABS
25.0000 mg | ORAL_TABLET | Freq: Two times a day (BID) | ORAL | Status: DC
  Administered 2022-05-01 – 2022-05-03 (×5): 25 mg via ORAL
  Filled 2022-05-01 (×5): qty 1

## 2022-05-01 MED ORDER — SODIUM CHLORIDE 0.9 % IV SOLN: INTRAVENOUS | Status: AC

## 2022-05-01 NOTE — Progress Notes (Signed)
PROGRESS NOTE    Holly Phelps  YQM:[id number] DOB: 02-May-1961 DOA: 04/28/2022 PCP: [facility]   Brief Narrative:  61 year old with history of HLD, hypothyroidism, GERD, CVA, HTN presented with nausea vomiting.  She also admits of drinking every evening due to stress.  Upon admission there was concerns of alcoholic versus diabetic ketoacidosis.  She was started on insulin drip initially later transitioned to subcu with IV fluids.  Her p.o. intake is extremely poor and she is very deconditioned at this time.   Assessment & Plan:  Principal Problem:   Alcoholic ketoacidosis Active Problems:   Alcohol abuse   Alcohol withdrawal (HCC)   Macrocytic anemia   Elevated LFTs   Elevated lipase   Sepsis (HCC)   Lactic acidosis   Acute metabolic encephalopathy   AKI (acute kidney injury) ([place])   History of CVA (cerebrovascular accident)   HLD (hyperlipidemia)   Hypothyroidism   Malnutrition of moderate degree    Alcoholic ketoacidosis Alcohol abuse with concerns of withdrawal Moderate to severe protein calorie malnutrition -Alcohol withdrawal protocol.  Thiamine, multivitamin, folic acid.  Anion gap is resolved - Bicarb and lactic acidosis have improved. -PPI twice daily -Encourage p.o. intake.  Patient has lack of desire to eat and drink.  Supplements ordered.  Nausea/Vomiting, improved -Nausea vomiting improved suspect gastritis/esophagitis.  Poor oral intake.  PPI twice daily.  SIRS - Initially received broad-spectrum antibiotics.  UA, cultures, chest x-ray negative.  Procalcitonin negative.  No longer antibiotics.  Hypokalemia/hypomagnesemia/hypophosphatemia - Aggressive repletion as necessary aggressive repletion.  Repeat labs later again today  Hyperglycemia - A1c 4.4.  Sliding scale and Accu-Chek.  Acute metabolic encephalopathy - Resolved  Chronic T12 compression fracture - Pain control  Acute kidney injury - Baseline creatinine 0.8.   Admission creatinine 1.7.  Resolved with IV fluids.  Transaminitis with elevated lipase - Resolved.  Macrocytic anemia - Folic acid and B28 normal.  Hemoglobin stable at 8.2.  History of CVA Hyperlipidemia - Currently statin on hold due to transaminitis  Hypothyroidism -Synthroid    DVT prophylaxis: Lovenox Code Status: Full code Family Communication: Significant other at bedside  Status is: Inpatient Remains inpatient appropriate because: P.o. intake is still extremely poor.  She is very deconditioned as well.  Maintain hospital stay for hydration  Subjective:  Seen and examined at bedside, overall appears very weak and deconditioned. Denies any nausea vomiting.  Remains afebrile.  Examination: Constitutional: Not in acute distress, cachectic frail with muscle wasting Respiratory: Clear to auscultation bilaterally Cardiovascular: Sinus tachycardia with minimal movement Abdomen: Nontender nondistended good bowel sounds Musculoskeletal: No edema noted Skin: No rashes seen Neurologic: CN 2-12 grossly intact.  And nonfocal Psychiatric: Normal judgment and insight. Alert and oriented x 3. Normal mood.  Objective: Vitals:   04/30/22 1430 04/30/22 1524 04/30/22 2000 05/01/22 0607  BP: 110/77 (!) 150/89 (!) 146/92 (!) 151/93  Pulse: (!) 113 95 99 (!) 108  Resp: '18 17  20  '$ Temp: 98.7 F (37.1 C) 98.7 F (37.1 C) 99 F (37.2 C) 98.5 F (36.9 C)  TempSrc: Oral Oral Oral   SpO2: 100% 100%  100%  Weight:      Height:        Intake/Output Summary (Last 24 hours) at 05/01/2022 1215 Last data filed at 05/01/2022 0300 Gross per 24 hour  Intake 490.14 ml  Output 400 ml  Net 90.14 ml   Filed Weights   04/28/22 1333 04/30/22 0418  Weight: 49.9 kg 46.3 kg  Data Reviewed:   CBC: Recent Labs  Lab 04/28/22 1058 04/29/22 0301 04/30/22 0455 05/01/22 0511  WBC 11.8* 9.9 7.4 6.2  NEUTROABS 10.4*  --   --   --   HGB 11.0* 8.2* 8.3* 8.6*  HCT 33.6* 24.1* 25.7*  26.2*  MCV 108.4* 107.6* 110.3* 109.2*  PLT 429* 222 223 102   Basic Metabolic Panel: Recent Labs  Lab 04/28/22 1511 04/29/22 0301 04/29/22 1003 04/29/22 1332 04/30/22 0455 05/01/22 0511  NA 143 143 138  --  139 137  K 3.8 2.6* 3.3* 3.1* 3.7 3.6  CL 106 106 102  --  104 104  CO2 14* 28 27  --  26 25  GLUCOSE 142* 126* 103*  --  102* 94  BUN 26* 16 11  --  7* 8  CREATININE 1.24* 0.81 0.71  --  0.67 0.66  CALCIUM 8.0* 8.4* 7.8*  --  8.2* 8.3*  MG  --  1.1* 1.8 2.7* 1.9 1.7  PHOS  --  <1.0* 2.9 3.1 1.5* 2.8   GFR: Estimated Creatinine Clearance: 54 mL/min (by C-G formula based on SCr of 0.66 mg/dL). Liver Function Tests: Recent Labs  Lab 04/28/22 1030 04/29/22 0301 04/29/22 1003 04/30/22 0455 05/01/22 0511  AST 134* 84*  --  99* 96*  ALT 65* 42  --  46* 51*  ALKPHOS 240* 164*  --  191* 204*  BILITOT 2.3* 0.8  --  1.0 0.9  PROT 9.6* 6.7  --  6.6 7.0  ALBUMIN 5.2* 3.5 3.2* 3.6 3.8   Recent Labs  Lab 04/28/22 1030  LIPASE 196*   No results for input(s): "AMMONIA" in the last 168 hours. Coagulation Profile: Recent Labs  Lab 04/29/22 0301  INR 1.0   Cardiac Enzymes: No results for input(s): "CKTOTAL", "CKMB", "CKMBINDEX", "TROPONINI" in the last 168 hours. BNP (last 3 results) No results for input(s): "PROBNP" in the last 8760 hours. HbA1C: Recent Labs    04/28/22 1658  HGBA1C 4.4*   CBG: Recent Labs  Lab 04/30/22 2057 04/30/22 2345 05/01/22 0340 05/01/22 0749 05/01/22 1154  GLUCAP 137* 89 95 96 108*   Lipid Profile: No results for input(s): "CHOL", "HDL", "LDLCALC", "TRIG", "CHOLHDL", "LDLDIRECT" in the last 72 hours. Thyroid Function Tests: No results for input(s): "TSH", "T4TOTAL", "FREET4", "T3FREE", "THYROIDAB" in the last 72 hours. Anemia Panel: Recent Labs    04/28/22 1855  VITAMINB12 827  FOLATE 33.2   Sepsis Labs: Recent Labs  Lab 04/28/22 1058 04/28/22 1232 04/28/22 1855 04/28/22 2151 04/29/22 0301 04/30/22 0455   PROCALCITON  --   --  <0.10  --  <0.10 <0.10  LATICACIDVEN 2.1* 3.4* 1.4 1.3  --   --     Recent Results (from the past 240 hour(s))  Blood culture (routine x 2)     Status: None (Preliminary result)   Collection Time: 04/28/22 10:28 AM   Specimen: BLOOD  Result Value Ref Range Status   Specimen Description   Final    BLOOD BLOOD RIGHT ARM Performed at [facility] [address] [place], [place] [address]    Special Requests   Final    BOTTLES DRAWN AEROBIC AND ANAEROBIC Blood Culture adequate volume Performed at [facility] [address] [place], [place] [address]    Culture   Final    NO GROWTH 3 DAYS Performed at [facility], [place] [address] [place], [place] [address]    Report Status PENDING  Incomplete  Blood culture (routine x 2)     Status: None (Preliminary result)   Collection Time: 04/28/22 10:48 AM   Specimen: BLOOD  Result Value Ref Range Status   Specimen Description   Final    BLOOD BLOOD RIGHT HAND Performed at [facility] [address] [place], [place] [address]    Special Requests   Final    BOTTLES DRAWN AEROBIC AND ANAEROBIC Blood Culture results may not be optimal due to an excessive volume of blood received in culture bottles Performed at [facility] [address] [place], [place] [address]    Culture   Final    NO GROWTH 3 DAYS Performed at [facility], [place] [address] [place], [place] [address]    Report Status PENDING  Incomplete  MRSA Next Gen by PCR, Nasal     Status: None   Collection Time: 04/29/22 12:30 AM   Specimen: Nasal Mucosa; Nasal Swab  Result Value Ref Range Status   MRSA by PCR Next Gen NOT DETECTED NOT DETECTED Final    Comment: (NOTE) The GeneXpert MRSA Assay (FDA approved for NASAL specimens only), is one component of a comprehensive MRSA colonization surveillance program. It is not intended to diagnose MRSA infection nor to  guide or monitor treatment for MRSA infections. Test performance is not FDA approved in patients less than 39 years old. Performed at [facility], [place] [address] [place],  [address]          Radiology Studies: No results found.      Scheduled Meds:  enoxaparin (LOVENOX) injection  40 mg Subcutaneous QHS   feeding supplement  237 mL Oral BID BM   insulin aspart  0-15 Units Subcutaneous Q4H   levothyroxine  25 mcg Oral QAC breakfast   metoprolol tartrate  25 mg Oral BID   multivitamin with minerals  1 tablet Oral Daily   pantoprazole  40 mg Oral BID   Continuous Infusions:  sodium chloride 75 mL/hr at 05/01/22 1008   thiamine injection 500 mg (05/01/22 0632)     LOS: 3 days   Time spent= 35 mins    German Manke Arsenio Loader, MD [facility]  If 7PM-7AM, please contact night-coverage  05/01/2022, 12:15 PM

## 2022-05-01 NOTE — TOC Transition Note (Signed)
Transition of Care [facility]) - CM/SW Discharge Note   Patient Details  Name: Holly Phelps MRN: [id number] Date of Birth: 07-Aug-1961  [facility] CM/SW Contact:  Vassie Moselle, LCSW Phone Number: 05/01/2022, 11:05 AM   Clinical Narrative:    Met with pt to discuss substance use and interest in resources. Pt provided minimal engagement in conversation. Pt minimizes use and does not need see her alcohol use as an issue.  Pt declined resources for substance use. CSW informed pt that if she changes her mind or would like to re-discuss concerns to ask for CSW. No further TOC needs identified at this time. Please consult TOC if needs arise.      Barriers to Discharge: Continued Medical Work up   Patient Goals and CMS Choice Patient states their goals for this hospitalization and ongoing recovery are:: Return home      Discharge Placement                       Discharge Plan and Services     Post Acute Care Choice: NA          DME Arranged: N/A                    Social Determinants of Health (SDOH) Interventions     Readmission Risk Interventions    04/30/2022   12:15 PM  Readmission Risk Prevention Plan  Transportation Screening Complete  PCP or Specialist Appt within 5-7 Days Complete  Home Care Screening Complete  Medication Review (RN CM) Complete

## 2022-05-02 DIAGNOSIS — E8729 Other acidosis: Secondary | ICD-10-CM | POA: Diagnosis not present

## 2022-05-02 LAB — GLUCOSE, CAPILLARY
Glucose-Capillary: 102 mg/dL — ABNORMAL HIGH (ref 70–99)
Glucose-Capillary: 106 mg/dL — ABNORMAL HIGH (ref 70–99)
Glucose-Capillary: 107 mg/dL — ABNORMAL HIGH (ref 70–99)
Glucose-Capillary: 120 mg/dL — ABNORMAL HIGH (ref 70–99)
Glucose-Capillary: 149 mg/dL — ABNORMAL HIGH (ref 70–99)
Glucose-Capillary: 94 mg/dL (ref 70–99)

## 2022-05-02 LAB — COMPREHENSIVE METABOLIC PANEL
ALT: 49 U/L — ABNORMAL HIGH (ref 0–44)
AST: 78 U/L — ABNORMAL HIGH (ref 15–41)
Albumin: 3.4 g/dL — ABNORMAL LOW (ref 3.5–5.0)
Alkaline Phosphatase: 210 U/L — ABNORMAL HIGH (ref 38–126)
Anion gap: 11 (ref 5–15)
BUN: 9 mg/dL (ref 8–23)
CO2: 21 mmol/L — ABNORMAL LOW (ref 22–32)
Calcium: 8.4 mg/dL — ABNORMAL LOW (ref 8.9–10.3)
Chloride: 103 mmol/L (ref 98–111)
Creatinine, Ser: 0.56 mg/dL (ref 0.44–1.00)
GFR, Estimated: >60 mL/min (ref >60)
Glucose, Bld: 99 mg/dL (ref 70–99)
Potassium: 3.1 mmol/L — ABNORMAL LOW (ref 3.5–5.1)
Sodium: 135 mmol/L (ref 135–145)
Total Bilirubin: 0.9 mg/dL (ref 0.3–1.2)
Total Protein: 6.9 g/dL (ref 6.5–8.1)

## 2022-05-02 LAB — CBC
HCT: 27.3 % — ABNORMAL LOW (ref 36.0–46.0)
Hemoglobin: 9 g/dL — ABNORMAL LOW (ref 12.0–15.0)
MCH: 35.4 pg — ABNORMAL HIGH (ref 26.0–34.0)
MCHC: 33 g/dL (ref 30.0–36.0)
MCV: 107.5 fL — ABNORMAL HIGH (ref 80.0–100.0)
Platelets: 240 10*3/uL (ref 150–400)
RBC: 2.54 MIL/uL — ABNORMAL LOW (ref 3.87–5.11)
RDW: 12.6 % (ref 11.5–15.5)
WBC: 8.2 10*3/uL (ref 4.0–10.5)
nRBC: 0 % (ref 0.0–0.2)

## 2022-05-02 LAB — MAGNESIUM: Magnesium: 1.5 mg/dL — ABNORMAL LOW (ref 1.7–2.4)

## 2022-05-02 LAB — PHOSPHORUS: Phosphorus: 2.4 mg/dL — ABNORMAL LOW (ref 2.5–4.6)

## 2022-05-02 MED ORDER — MAGNESIUM SULFATE 2 GM/50ML IV SOLN
2.0000 g | Freq: Once | INTRAVENOUS | Status: AC
  Administered 2022-05-02: 2 g via INTRAVENOUS
  Filled 2022-05-02: qty 50

## 2022-05-02 MED ORDER — POTASSIUM CHLORIDE 20 MEQ PO PACK
40.0000 meq | PACK | Freq: Once | ORAL | Status: AC
  Administered 2022-05-02: 40 meq via ORAL
  Filled 2022-05-02: qty 2

## 2022-05-02 MED ORDER — POTASSIUM PHOSPHATES 15 MMOLE/5ML IV SOLN
30.0000 mmol | Freq: Once | INTRAVENOUS | Status: AC
  Administered 2022-05-02: 30 mmol via INTRAVENOUS
  Filled 2022-05-02: qty 10

## 2022-05-02 NOTE — Progress Notes (Signed)
PROGRESS NOTE    Holly Phelps  ZOX:[id number] DOB: 10-12-1961 DOA: 04/28/2022  PCP: [facility]   Brief Narrative:  This 61 year old female with history of HLD, hypothyroidism, GERD, CVA, HTN presented with nausea and vomiting.  She also admits of drinking every evening due to stress.  Upon admission there was concerns of alcoholic versus diabetic ketoacidosis.  She was started on insulin drip initially later transitioned to subcu with IV fluids.  Her p.o. intake is extremely poor and she is very deconditioned at this time.   Assessment & Plan:   Principal Problem:   Alcoholic ketoacidosis Active Problems:   Alcohol abuse   Alcohol withdrawal (HCC)   Macrocytic anemia   Elevated LFTs   Elevated lipase   Sepsis (HCC)   Lactic acidosis   Acute metabolic encephalopathy   AKI (acute kidney injury) ([place])   History of CVA (cerebrovascular accident)   HLD (hyperlipidemia)   Hypothyroidism   Malnutrition of moderate degree   EtOH abuse with concerns of withdrawal: Moderate to severe protein calorie malnutrition Continue alcohol withdrawal protocol. Continue thiamine, multivitamin, folic acid.  Anion gap is resolved. Bicarb and lactic acidosis has improved. Continue pantoprazole twice daily Encouraged to drink more fluids.  Nausea / Vomiting >  Resolved Suspect due to gastritis/esophagitis. Continue pantoprazole 40 mg twice daily  SIRS >  Resolved. Initially received broad-spectrum antibiotics. UA, cultures, chest x-ray negative, procalcitonin negative Antibiotics discontinued.  Hypokalemia / Hypomagnesemia / Hypophosphatemia: Replaced.  Continue to monitor   Hyperglycemia: Patient denies any history of diabetes or prediabetes. Hemoglobin A1c 4.4,    Acute metabolic encephalopathy >  Resolved,  T12 compression fracture Continue adequate pain control  Acute kidney injury: serum creatinine 1.7  at admission, baseline creatinine 0.8 .  Avoid  nephrotoxic medications. Resolved with IV hydration:   Elevated liver enzymes: Normalized.  Macrocytic anemia: Continue Folic acid and W11 normal.  Hemoglobin stable at 8.2.   History of CVA Hyperlipidemia Currently statin on hold due to transaminitis.   Hypothyroidism Continue levothyroxine 25 mcg daily   DVT prophylaxis: Lovenox Code Status: Full code Family Communication: No family at bedside Disposition Plan:   Status is: Inpatient Remains inpatient appropriate because:   Admitted for nausea and vomiting very deconditioned.  Still has decreased p.o. intake generalized weakness.  PT and OT pending     Consultants:  None  Procedures: None . antimicrobials:  Anti-infectives (From admission, onward)    Start     Dose/Rate Route Frequency Ordered Stop   04/30/22 1500  vancomycin (VANCOCIN) IVPB 1000 mg/200 mL premix  Status:  Discontinued        1,000 mg 200 mL/hr over 60 Minutes Intravenous Every 48 hours 04/28/22 1826 04/29/22 0738   04/29/22 1800  ceFEPIme (MAXIPIME) 2 g in sodium chloride 0.9 % 100 mL IVPB  Status:  Discontinued        2 g 200 mL/hr over 30 Minutes Intravenous Every 24 hours 04/28/22 1826 04/29/22 0738   04/28/22 1830  metroNIDAZOLE (FLAGYL) IVPB 500 mg  Status:  Discontinued        500 mg 100 mL/hr over 60 Minutes Intravenous Every 12 hours 04/28/22 1805 04/29/22 0738   04/28/22 1430  vancomycin (VANCOCIN) IVPB 1000 mg/200 mL premix        1,000 mg 200 mL/hr over 60 Minutes Intravenous  Once 04/28/22 1405 04/28/22 1621   04/28/22 1415  ceFEPIme (MAXIPIME) 2 g in sodium chloride 0.9 % 100 mL IVPB  2 g 200 mL/hr over 30 Minutes Intravenous  Once 04/28/22 1405 04/28/22 1852   04/28/22 1400  piperacillin-tazobactam (ZOSYN) IVPB 3.375 g  Status:  Discontinued        3.375 g 100 mL/hr over 30 Minutes Intravenous  Once 04/28/22 1352 04/28/22 1405       Subjective: Patient was seen and examined at bedside.  Overnight events noted.    Patient reports feeling very weak and tired.  She appears very deconditioned.   Denies any chest pain,  reported nausea and vomiting has improved.  Objective: Vitals:   05/01/22 2354 05/02/22 0404 05/02/22 0800 05/02/22 1204  BP: (!) 150/96 (!) 166/88 (!) 156/93 (!) 155/99  Pulse: (!) 101 (!) 110 (!) 105 (!) 102  Resp:   20   Temp: 99.2 F (37.3 C) 99.7 F (37.6 C) 99.4 F (37.4 C) 98.8 F (37.1 C)  TempSrc: Oral Oral Oral Oral  SpO2: 100% 98% 100% 100%  Weight:      Height:        Intake/Output Summary (Last 24 hours) at 05/02/2022 1426 Last data filed at 05/02/2022 5993 Gross per 24 hour  Intake 1723.96 ml  Output --  Net 1723.96 ml   Filed Weights   04/28/22 1333 04/30/22 0418  Weight: 49.9 kg 46.3 kg    Examination:  General exam: Elderly frail female cachectic with muscle wasting.  Deconditioned. Respiratory system: CTA bilaterally, no wheezing, no crackles, normal respiratory effort. Cardiovascular system: S1-S2 heard, regular rate and rhythm, no murmur. Gastrointestinal system: Abdomen is soft, non tender, non distended, BS+ Central nervous system: Alert and oriented x 2 . No focal neurological deficits. Extremities: No edema, no cyanosis, no clubbing Skin: No rashes, lesions or ulcers Psychiatry: Judgement and insight appear normal. Mood & affect appropriate.     Data Reviewed: I have personally reviewed following labs and imaging studies  CBC: Recent Labs  Lab 04/28/22 1058 04/29/22 0301 04/30/22 0455 05/01/22 0511 05/02/22 0550  WBC 11.8* 9.9 7.4 6.2 8.2  NEUTROABS 10.4*  --   --   --   --   HGB 11.0* 8.2* 8.3* 8.6* 9.0*  HCT 33.6* 24.1* 25.7* 26.2* 27.3*  MCV 108.4* 107.6* 110.3* 109.2* 107.5*  PLT 429* 222 223 223 570   Basic Metabolic Panel: Recent Labs  Lab 04/29/22 0301 04/29/22 1003 04/29/22 1332 04/30/22 0455 05/01/22 0511 05/02/22 0550  NA 143 138  --  139 137 135  K 2.6* 3.3* 3.1* 3.7 3.6 3.1*  CL 106 102  --  104 104 103   CO2 28 27  --  26 25 21*  GLUCOSE 126* 103*  --  102* 94 99  BUN 16 11  --  7* 8 9  CREATININE 0.81 0.71  --  0.67 0.66 0.56  CALCIUM 8.4* 7.8*  --  8.2* 8.3* 8.4*  MG 1.1* 1.8 2.7* 1.9 1.7 1.5*  PHOS <1.0* 2.9 3.1 1.5* 2.8 2.4*   GFR: Estimated Creatinine Clearance: 54 mL/min (by C-G formula based on SCr of 0.56 mg/dL). Liver Function Tests: Recent Labs  Lab 04/28/22 1030 04/29/22 0301 04/29/22 1003 04/30/22 0455 05/01/22 0511 05/02/22 0550  AST 134* 84*  --  99* 96* 78*  ALT 65* 42  --  46* 51* 49*  ALKPHOS 240* 164*  --  191* 204* 210*  BILITOT 2.3* 0.8  --  1.0 0.9 0.9  PROT 9.6* 6.7  --  6.6 7.0 6.9  ALBUMIN 5.2* 3.5 3.2* 3.6 3.8 3.4*  Recent Labs  Lab 04/28/22 1030  LIPASE 196*   No results for input(s): "AMMONIA" in the last 168 hours. Coagulation Profile: Recent Labs  Lab 04/29/22 0301  INR 1.0   Cardiac Enzymes: No results for input(s): "CKTOTAL", "CKMB", "CKMBINDEX", "TROPONINI" in the last 168 hours. BNP (last 3 results) No results for input(s): "PROBNP" in the last 8760 hours. HbA1C: No results for input(s): "HGBA1C" in the last 72 hours. CBG: Recent Labs  Lab 05/01/22 2007 05/01/22 2356 05/02/22 0403 05/02/22 0802 05/02/22 1205  GLUCAP 117* 96 106* 107* 120*   Lipid Profile: No results for input(s): "CHOL", "HDL", "LDLCALC", "TRIG", "CHOLHDL", "LDLDIRECT" in the last 72 hours. Thyroid Function Tests: No results for input(s): "TSH", "T4TOTAL", "FREET4", "T3FREE", "THYROIDAB" in the last 72 hours. Anemia Panel: No results for input(s): "VITAMINB12", "FOLATE", "FERRITIN", "TIBC", "IRON", "RETICCTPCT" in the last 72 hours. Sepsis Labs: Recent Labs  Lab 04/28/22 1058 04/28/22 1232 04/28/22 1855 04/28/22 2151 04/29/22 0301 04/30/22 0455  PROCALCITON  --   --  <0.10  --  <0.10 <0.10  LATICACIDVEN 2.1* 3.4* 1.4 1.3  --   --     Recent Results (from the past 240 hour(s))  Blood culture (routine x 2)     Status: None (Preliminary result)    Collection Time: 04/28/22 10:28 AM   Specimen: BLOOD  Result Value Ref Range Status   Specimen Description   Final    BLOOD BLOOD RIGHT ARM Performed at [facility] [address] [place], [place] [address]    Special Requests   Final    BOTTLES DRAWN AEROBIC AND ANAEROBIC Blood Culture adequate volume Performed at [facility] [address], [place], [place] [address]    Culture   Final    NO GROWTH 4 DAYS Performed at [facility], [place] [address], [place], [place] [address]    Report Status PENDING  Incomplete  Blood culture (routine x 2)     Status: None (Preliminary result)   Collection Time: 04/28/22 10:48 AM   Specimen: BLOOD  Result Value Ref Range Status   Specimen Description   Final    BLOOD BLOOD RIGHT HAND Performed at [facility] [address], [place], [place] [address]    Special Requests   Final    BOTTLES DRAWN AEROBIC AND ANAEROBIC Blood Culture results may not be optimal due to an excessive volume of blood received in culture bottles Performed at [facility] [address] [place], [place] [address]    Culture   Final    NO GROWTH 4 DAYS Performed at [facility], [place] [address] [place],  [address]    Report Status PENDING  Incomplete  MRSA Next Gen by PCR, Nasal     Status: None   Collection Time: 04/29/22 12:30 AM   Specimen: Nasal Mucosa; Nasal Swab  Result Value Ref Range Status   MRSA by PCR Next Gen NOT DETECTED NOT DETECTED Final    Comment: (NOTE) The GeneXpert MRSA Assay (FDA approved for NASAL specimens only), is one component of a comprehensive MRSA colonization surveillance program. It is not intended to diagnose MRSA infection nor to guide or monitor treatment for MRSA infections. Test performance is not FDA approved in patients less than 34 years old. Performed at [facility], [place] [address] [place],   [address]     Radiology Studies: No results found.  Scheduled Meds:  enoxaparin (LOVENOX) injection  40 mg Subcutaneous QHS   feeding supplement  237 mL Oral BID BM   insulin aspart  0-15 Units Subcutaneous Q4H   levothyroxine  25 mcg Oral QAC breakfast   metoprolol tartrate  25 mg Oral BID   multivitamin with minerals  1 tablet Oral Daily   pantoprazole  40 mg Oral BID   Continuous Infusions:  potassium PHOSPHATE IVPB (in mmol) 30 mmol (05/02/22 1203)     LOS: 4 days    Time spent: 50 mins    Averi Kilty, MD [facility]   If 7PM-7AM, please contact night-coverage

## 2022-05-02 NOTE — TOC Progression Note (Addendum)
Transition of Care [facility]) - Progression Note    Patient Details  Name: Holly Phelps MRN: [id number] Date of Birth: 24-May-1961  Transition of Care [facility]) CM/SW [place], [place] Phone Number: 05/02/2022, 2:10 PM  Clinical Narrative:    CSW met with pt's spouse, sister-in-law and representatives from [facility] to address questions/concerns regarding this patients care and recommendations. Pt's family have concern with pt's lack of mobility and lack of p.o. intake. They feel that at her current level of functioning they are unable to provide appropriate care for her. CSW provided listening ear and support to family. Pt's family is interested in this pt being evaluated by PT/OT to determine if pt is eligible for further assistance at home. Pt's spouse reports that this pt has a 4-prong cane, walker, and wheelchair at home. PT/OT evaluation has been ordered for this pt. Pt is now expected to discharge tomorrow. CSW will continue to follow for PT/OT recommendations.    Expected Discharge Plan: Home/Self Care Barriers to Discharge: Continued Medical Work up  Expected Discharge Plan and Services Expected Discharge Plan: Home/Self Care     Post Acute Care Choice: NA Living arrangements for the past 2 months: Hotel/Motel                 DME Arranged: N/A                     Social Determinants of Health (SDOH) Interventions    Readmission Risk Interventions    04/30/2022   12:15 PM  Readmission Risk Prevention Plan  Transportation Screening Complete  PCP or Specialist Appt within 5-7 Days Complete  Home Care Screening Complete  Medication Review (RN CM) Complete

## 2022-05-03 DIAGNOSIS — E8729 Other acidosis: Secondary | ICD-10-CM | POA: Diagnosis not present

## 2022-05-03 LAB — PHOSPHORUS: Phosphorus: 2.7 mg/dL (ref 2.5–4.6)

## 2022-05-03 LAB — VITAMIN C: Vitamin C: 0.6 mg/dL (ref 0.4–2.0)

## 2022-05-03 LAB — GLUCOSE, CAPILLARY
Glucose-Capillary: 98 mg/dL (ref 70–99)
Glucose-Capillary: 114 mg/dL — ABNORMAL HIGH (ref 70–99)
Glucose-Capillary: 114 mg/dL — ABNORMAL HIGH (ref 70–99)

## 2022-05-03 LAB — CULTURE, BLOOD (ROUTINE X 2)
Culture: NO GROWTH
Culture: NO GROWTH
Special Requests: ADEQUATE

## 2022-05-03 LAB — CBC
HCT: 27.8 % — ABNORMAL LOW (ref 36.0–46.0)
Hemoglobin: 9.2 g/dL — ABNORMAL LOW (ref 12.0–15.0)
MCH: 35.2 pg — ABNORMAL HIGH (ref 26.0–34.0)
MCHC: 33.1 g/dL (ref 30.0–36.0)
MCV: 106.5 fL — ABNORMAL HIGH (ref 80.0–100.0)
Platelets: 279 10*3/uL (ref 150–400)
RBC: 2.61 MIL/uL — ABNORMAL LOW (ref 3.87–5.11)
RDW: 12.7 % (ref 11.5–15.5)
WBC: 9.7 10*3/uL (ref 4.0–10.5)
nRBC: 0 % (ref 0.0–0.2)

## 2022-05-03 LAB — VITAMIN B6: Vitamin B6: 7.6 ug/L (ref 3.4–65.2)

## 2022-05-03 LAB — MAGNESIUM: Magnesium: 1.8 mg/dL (ref 1.7–2.4)

## 2022-05-03 LAB — COMPREHENSIVE METABOLIC PANEL
ALT: 43 U/L (ref 0–44)
AST: 59 U/L — ABNORMAL HIGH (ref 15–41)
Albumin: 3.3 g/dL — ABNORMAL LOW (ref 3.5–5.0)
Alkaline Phosphatase: 194 U/L — ABNORMAL HIGH (ref 38–126)
Anion gap: 12 (ref 5–15)
BUN: 9 mg/dL (ref 8–23)
CO2: 20 mmol/L — ABNORMAL LOW (ref 22–32)
Calcium: 8.2 mg/dL — ABNORMAL LOW (ref 8.9–10.3)
Chloride: 101 mmol/L (ref 98–111)
Creatinine, Ser: 0.56 mg/dL (ref 0.44–1.00)
GFR, Estimated: >60 mL/min (ref >60)
Glucose, Bld: 90 mg/dL (ref 70–99)
Potassium: 3.6 mmol/L (ref 3.5–5.1)
Sodium: 133 mmol/L — ABNORMAL LOW (ref 135–145)
Total Bilirubin: 0.9 mg/dL (ref 0.3–1.2)
Total Protein: 6.8 g/dL (ref 6.5–8.1)

## 2022-05-03 LAB — ZINC: Zinc: 66 ug/dL (ref 44–115)

## 2022-05-03 MED ORDER — METOPROLOL TARTRATE 25 MG PO TABS
25.0000 mg | ORAL_TABLET | Freq: Two times a day (BID) | ORAL | 1 refills | Status: AC
Start: 2022-05-03 — End: ?

## 2022-05-03 NOTE — Evaluation (Signed)
Occupational Therapy Evaluation Patient Details Name: Holly Phelps MRN: [id number] DOB: 1961-01-29 Today's Date: 05/03/2022   History of Present Illness 61 year old female with history of HLD, hypothyroidism, GERD, CVA, HTN presented with nausea and vomiting.  She also admits of drinking every evening due to stress.  Dx  of alcoholic  ketoacidosis.   Clinical Impression   Holly Phelps is a 61 year old woman admitted with above medical history. On evaluation she presents with generalized weakness, decreased activity tolerance, impaired balance, chronic back pain, flat affect and chronic diplopia. She is able to transfer to edge of bed, min guard for ambulation with walker and min guard for ADLs. Patient to discharge today. Will recommend [facility] OT services with 24/7 assistance at home.       Recommendations for follow up therapy are one component of a multi-disciplinary discharge planning process, led by the attending physician.  Recommendations may be updated based on patient status, additional functional criteria and insurance authorization.   Follow Up Recommendations  Home health OT    Assistance Recommended at Discharge Frequent or constant Supervision/Assistance  Patient can return home with the following A little help with walking and/or transfers;A little help with bathing/dressing/bathroom;Assistance with cooking/housework;Direct supervision/assist for financial management;Direct supervision/assist for medications management;Help with stairs or ramp for entrance;Assist for transportation    Functional Status Assessment  Patient has had a recent decline in their functional status and demonstrates the ability to make significant improvements in function in a reasonable and predictable amount of time.  Equipment Recommendations  None recommended by OT    Recommendations for Other Services       Precautions / Restrictions Precautions Precautions: Fall Precaution Comments:  several falls in past 6 months related to drinking per spouse Restrictions Weight Bearing Restrictions: No      Mobility Bed Mobility Overal bed mobility: Modified Independent             General bed mobility comments: increased time    Transfers Overall transfer level: Needs assistance Equipment used: Rolling walker (2 wheels)               General transfer comment: Min guard for ambulation - use of gait belt and walker. Exhibited small shuffling gait      Balance Overall balance assessment: Needs assistance Sitting-balance support: No upper extremity supported, Feet supported Sitting balance-Leahy Scale: Fair     Standing balance support: During functional activity, Reliant on assistive device for balance Standing balance-Leahy Scale: Poor                             ADL either performed or assessed with clinical judgement   ADL Overall ADL's : Needs assistance/impaired Eating/Feeding: Independent   Grooming: Min guard;Standing   Upper Body Bathing: Set up;Sitting   Lower Body Bathing: Min guard;Sit to/from stand;Set up   Upper Body Dressing : Set up;Sitting   Lower Body Dressing: Min guard;Sit to/from stand   Toilet Transfer: Min guard;Rolling walker (2 wheels);Grab bars;Ambulation   Toileting- Clothing Manipulation and Hygiene: Min guard;Sit to/from stand       Functional mobility during ADLs: Min guard;Rolling walker (2 wheels)       Vision Baseline Vision/History: 1 Wears glasses Patient Visual Report: Diplopia (since CVA in December)       Perception     Praxis      Pertinent Vitals/Pain Pain Assessment Pain Assessment: Faces Faces Pain Scale: Hurts little more Pain Location:  low back Pain Descriptors / Indicators: Grimacing, Aching Pain Intervention(s): Monitored during session     Hand Dominance Right   Extremity/Trunk Assessment Upper Extremity Assessment Upper Extremity Assessment: RUE deficits/detail;LUE  deficits/detail RUE Deficits / Details: Functional ROM, grossly 4-/5 strength RUE Sensation: WNL RUE Coordination: WNL LUE Deficits / Details: Functional ROM, grossly 4-/5 strength LUE Sensation: WNL LUE Coordination: WNL   Lower Extremity Assessment Lower Extremity Assessment: Defer to PT evaluation   Cervical / Trunk Assessment Cervical / Trunk Assessment: Normal   Communication Communication Communication: No difficulties   Cognition Arousal/Alertness: Awake/alert Behavior During Therapy: WFL for tasks assessed/performed Overall Cognitive Status: Within Functional Limits for tasks assessed                                       General Comments  double vision since TIA in December    Exercises     Shoulder Instructions      Home Living Family/patient expects to be discharged to:: Private residence Living Arrangements: Spouse/significant other Available Help at Discharge: Family   Home Access: Ramped entrance;Stairs to enter Entrance Stairs-Number of Steps: 1   Home Layout: One level     Bathroom Shower/Tub: Walk-in Psychologist, prison and probation services: Standard     Home Equipment: Conservation officer, nature (2 wheels);[place] - manual;Grab bars - toilet;Shower seat;Grab bars - tub/shower;Hand held shower head          Prior Functioning/Environment Prior Level of Function : Needs assist             Mobility Comments: walked with RW when going out, quad cane in home ADLs Comments: assist for bathing (reaching water knobs), independent dressing        OT Problem List: Decreased strength;Decreased activity tolerance;Impaired balance (sitting and/or standing);Decreased knowledge of use of DME or AE      OT Treatment/Interventions:      OT Goals(Current goals can be found in the care plan section) Acute Rehab OT Goals OT Goal Formulation: All assessment and education complete, DC therapy  OT Frequency:      Co-evaluation               AM-PAC OT "6 Clicks" Daily Activity     Outcome Measure Help from another person eating meals?: None Help from another person taking care of personal grooming?: A Little Help from another person toileting, which includes using toliet, bedpan, or urinal?: A Little Help from another person bathing (including washing, rinsing, drying)?: A Little Help from another person to put on and taking off regular upper body clothing?: A Little Help from another person to put on and taking off regular lower body clothing?: A Little 6 Click Score: 19   End of Session Equipment Utilized During Treatment: Rolling walker (2 wheels);Gait belt Nurse Communication: Mobility status  Activity Tolerance: Patient tolerated treatment well Patient left: in chair;with call bell/phone within reach;with family/visitor present  OT Visit Diagnosis: Muscle weakness (generalized) (M62.81);Unsteadiness on feet (R26.81)                Time: 1700-1749 OT Time Calculation (min): 28 min Charges:  OT General Charges $OT Visit: 1 Visit OT Evaluation $OT Eval Low Complexity: 1 Low  Elice Crigger, OTR/L [place]  Office [phone] Pager: [place] 05/03/2022, 10:20 AM

## 2022-05-03 NOTE — Discharge Instructions (Signed)
Advised to follow-up with primary care physician in 1 week. Advised to take metoprolol 25 mg twice daily for hypertension. Continue physical therapy.

## 2022-05-03 NOTE — Discharge Summary (Signed)
Physician Discharge Summary  Holly Phelps OXB:[id number] DOB: May 29, 1961 DOA: 04/28/2022  PCP: [facility] date: 04/28/2022  Discharge date: 05/03/2022  Admitted From: Home.  Disposition:  Home health Services  Recommendations for Outpatient Follow-up:  Follow up with PCP in 1-2 weeks Please obtain BMP/CBC in one week. Advised to take metoprolol 25 mg twice daily for hypertension. Continue physical therapy.  Home Health: Home PT/OT Equipment/Devices: None  Discharge Condition: Stable CODE STATUS:Full code Diet recommendation: Heart Healthy   Brief summary / Hospital Course: This 61 year old female with history of HLD, hypothyroidism, GERD, CVA, HTN presented in the ED with nausea and vomiting.  She also admits of drinking every evening due to stress.  Upon admission there was concerns of alcoholic versus diabetic ketoacidosis.  She was started on insulin drip initially later transitioned to subcu with IV fluids.  Her p.o. intake is extremely poor and she is very deconditioned at this time. Patient was admitted for EtOH abuse with concern for withdrawal.  Patient was managed with CIWA protocol, continued on pantoprazole.  Patient has felt much improved.  Electrolytes replaced.  Renal functions improved.  She was continued on home medications.  PT and OT recommended home health services.  She feels better and want to be discharged.  Home health services arranged.  Patient is being discharged home.   She was managed for below problems and hospital.  Discharge Diagnoses:  Principal Problem:   Alcoholic ketoacidosis Active Problems:   Alcohol abuse   Alcohol withdrawal (HCC)   Macrocytic anemia   Elevated LFTs   Elevated lipase   Sepsis (HCC)   Lactic acidosis   Acute metabolic encephalopathy   AKI (acute kidney injury) ([place])   History of CVA (cerebrovascular accident)   HLD (hyperlipidemia)   Hypothyroidism   Malnutrition of moderate  degree  EtOH abuse with concerns of withdrawal: Moderate to severe protein calorie malnutrition Continued alcohol withdrawal protocol. Continue thiamine, multivitamin, folic acid.  Anion gap is resolved. Bicarb and lactic acidosis has improved. Continue pantoprazole twice daily Encouraged to drink more fluids.   Nausea / Vomiting >  Resolved Suspect due to gastritis/esophagitis. Continue pantoprazole 40 mg twice daily   SIRS >  Resolved. Initially received broad-spectrum antibiotics. UA, cultures, chest x-ray negative, procalcitonin negative Antibiotics discontinued.   Hypokalemia / Hypomagnesemia / Hypophosphatemia: Replaced.  Resolved.   Hyperglycemia: Patient denies any history of diabetes or prediabetes. Hemoglobin A1c 4.4,    Acute metabolic encephalopathy >  Resolved,   T12 compression fracture Continue adequate pain control   Acute kidney injury: serum creatinine 1.7  at admission, baseline creatinine 0.8 .  Avoid nephrotoxic medications. Resolved with IV hydration:   Elevated liver enzymes: Normalized.   Macrocytic anemia: Continue Folic acid and A83 normal.  Hemoglobin stable at 8.2.   History of CVA Hyperlipidemia Currently statin on hold due to transaminitis.   Hypothyroidism Continue levothyroxine 25 mcg daily    Discharge Instructions  Discharge Instructions     Call MD for:  difficulty breathing, headache or visual disturbances   Complete by: As directed    Call MD for:  persistant dizziness or light-headedness   Complete by: As directed    Call MD for:  persistant nausea and vomiting   Complete by: As directed    Diet - low sodium heart healthy   Complete by: As directed    Diet Carb Modified   Complete by: As directed    Discharge instructions   Complete by:  As directed    Advised to follow-up with primary care physician in 1 week. Advised to take metoprolol 25 mg twice daily for hypertension. Continue physical therapy.   Increase  activity slowly   Complete by: As directed       Allergies as of 05/03/2022       Reactions   Codeine Nausea And Vomiting   Flexeril [cyclobenzaprine] Itching   Sulfa Antibiotics    Pt does not recall what the reaction was,been too long   Tramadol    Other reaction(s): nausea   Synthroid [levothyroxine] Rash        Medication List     TAKE these medications    atorvastatin 40 MG tablet Commonly known as: LIPITOR Take 40 mg by mouth daily.   famotidine 20 MG tablet Commonly known as: PEPCID Take 20 mg by mouth at bedtime as needed for heartburn or indigestion.   FERROUS GLUCONATE PO Take 240 mg by mouth in the morning and at bedtime.   levothyroxine 25 MCG tablet Commonly known as: SYNTHROID Take 25 mcg by mouth daily.   metoprolol tartrate 25 MG tablet Commonly known as: LOPRESSOR Take 1 tablet (25 mg total) by mouth 2 (two) times daily.        Follow-up Information     [facility] up in 1 week(s).   Contact information: [address] [place] [address] [phone]                Allergies  Allergen Reactions   Codeine Nausea And Vomiting   Flexeril [Cyclobenzaprine] Itching   Sulfa Antibiotics     Pt does not recall what the reaction was,been too long   Tramadol     Other reaction(s): nausea   Synthroid [Levothyroxine] Rash    Consultations: None   Procedures/Studies: CT ABDOMEN PELVIS W CONTRAST  Result Date: 04/28/2022 CLINICAL DATA:  Abdominal pain, vomiting EXAM: CT ABDOMEN AND PELVIS WITH CONTRAST TECHNIQUE: Multidetector CT imaging of the abdomen and pelvis was performed using the standard protocol following bolus administration of intravenous contrast. RADIATION DOSE REDUCTION: This exam was performed according to the departmental dose-optimization program which includes automated exposure control, adjustment of the mA and/or kV according to patient size and/or use of iterative  reconstruction technique. CONTRAST:  30m OMNIPAQUE IOHEXOL 300 MG/ML  SOLN COMPARISON:  None Available. FINDINGS: Lower chest: Lower lung fields are clear. Hepatobiliary: There is fatty infiltration in the liver. No focal abnormality is seen. There is no dilation of bile ducts. Gallbladder is not distended. Pancreas: No focal abnormality is seen. Spleen: Unremarkable. Adrenals/Urinary Tract: Adrenals are unremarkable. There is no hydronephrosis. There are no renal or ureteral stones. Evaluation of renal cortex is somewhat limited due to motion artifacts. Urinary bladder is unremarkable. Stomach/Bowel: Small hiatal hernia is seen. There is wall thickening in the lower thoracic esophagus. Stomach is not distended. Small bowel loops are not dilated. Appendix is not seen. Scattered diverticula are seen in the colon without signs of focal acute diverticulitis. Vascular/Lymphatic: Atherosclerotic plaques and calcifications are seen in the aorta and its major branches. Reproductive: Unremarkable. Other: There is no ascites or pneumoperitoneum. Musculoskeletal: There is compression fracture in the upper endplate of body of TW73vertebra. There is retropulsion of upper endplate of body of TX10vertebra causing extrinsic pressure over the ventral margin of thecal sac and spinal stenosis. IMPRESSION: There is no evidence intestinal obstruction or pneumoperitoneum. There is no hydronephrosis. Small hiatal hernia. There is  wall thickening in the lower thoracic esophagus suggesting possible reflux esophagitis. There is compression fracture in the upper endplate of body of R15 vertebra with 50% decrease in height. This may be recent or old. Please correlate with clinical symptoms. There is retropulsion of upper endplate of body of Q00 vertebra causing extrinsic pressure over the ventral margin of thecal sac and spinal stenosis. Fatty liver.  Diverticulosis of colon. Other findings as described in the body of the report.  Electronically Signed   By: Elmer Picker M.D.   On: 04/28/2022 12:24   DG Chest 2 View  Result Date: 04/28/2022 CLINICAL DATA:  Vomiting. EXAM: CHEST - 2 VIEW COMPARISON:  None Available. FINDINGS: Cardiac silhouette is normal in size and configuration. Normal mediastinal and hilar contours. Clear lungs.  No pleural effusion or pneumothorax. Mild compression deformity of T12, presumed chronic unless this patient has new back pain. Remainder of the visualized osseous structures are intact. IMPRESSION: 1. No active cardiopulmonary disease. 2. Mild compression fracture of T12, most likely chronic. Clinical correlation warranted. Electronically Signed   By: Lajean Manes M.D.   On: 04/28/2022 11:43      Subjective: Patient was seen and examined at bedside.  Overnight events noted. Patient reports feeling much improved and wants to be discharged.  Discharge Exam: Vitals:   05/03/22 0812 05/03/22 1204  BP: (!) 151/95 131/87  Pulse: (!) 106 91  Resp: 19 18  Temp: 98.6 F (37 C) 98.4 F (36.9 C)  SpO2: 100% 100%   Vitals:   05/02/22 2015 05/03/22 0318 05/03/22 0812 05/03/22 1204  BP: 129/75 138/90 (!) 151/95 131/87  Pulse: 96 97 (!) 106 91  Resp: '16 16 19 18  '$ Temp: 98.1 F (36.7 C) 98.8 F (37.1 C) 98.6 F (37 C) 98.4 F (36.9 C)  TempSrc: Oral Oral Oral Oral  SpO2: 99% 98% 100% 100%  Weight:      Height:        General: Pt is alert, awake, not in acute distress Cardiovascular: RRR, S1/S2 +, no rubs, no gallops Respiratory: CTA bilaterally, no wheezing, no rhonchi Abdominal: Soft, NT, ND, bowel sounds + Extremities: no edema, no cyanosis    The results of significant diagnostics from this hospitalization (including imaging, microbiology, ancillary and laboratory) are listed below for reference.     Microbiology: Recent Results (from the past 240 hour(s))  Blood culture (routine x 2)     Status: None   Collection Time: 04/28/22 10:28 AM   Specimen: BLOOD  Result  Value Ref Range Status   Specimen Description   Final    BLOOD BLOOD RIGHT ARM Performed at [facility] [address] [place], [place] [address]    Special Requests   Final    BOTTLES DRAWN AEROBIC AND ANAEROBIC Blood Culture adequate volume Performed at [facility] [address] [place], [place] [address]    Culture   Final    NO GROWTH 5 DAYS Performed at [facility], [place] [address], [place], [place] [address]    Report Status 05/03/2022 FINAL  Final  Blood culture (routine x 2)     Status: None   Collection Time: 04/28/22 10:48 AM   Specimen: BLOOD  Result Value Ref Range Status   Specimen Description   Final    BLOOD BLOOD RIGHT HAND Performed at [facility] [address], [place], [place] [address]    Special Requests   Final  BOTTLES DRAWN AEROBIC AND ANAEROBIC Blood Culture results may not be optimal due to an excessive volume of blood received in culture bottles Performed at [facility], [place] [address] [place], [place] [address]    Culture   Final    NO GROWTH 5 DAYS Performed at [facility], [place] [address], [place], [place] [address]    Report Status 05/03/2022 FINAL  Final  MRSA Next Gen by PCR, Nasal     Status: None   Collection Time: 04/29/22 12:30 AM   Specimen: Nasal Mucosa; Nasal Swab  Result Value Ref Range Status   MRSA by PCR Next Gen NOT DETECTED NOT DETECTED Final    Comment: (NOTE) The GeneXpert MRSA Assay (FDA approved for NASAL specimens only), is one component of a comprehensive MRSA colonization surveillance program. It is not intended to diagnose MRSA infection nor to guide or monitor treatment for MRSA infections. Test performance is not FDA approved in patients less than 23 years old. Performed at [facility], [place] [address] [place], [place] [address]      Labs: BNP (last 3 results) No results for input(s): "BNP" in  the last 8760 hours. Basic Metabolic Panel: Recent Labs  Lab 04/29/22 1003 04/29/22 1332 04/30/22 0455 05/01/22 0511 05/02/22 0550 05/03/22 0538  NA 138  --  139 137 135 133*  K 3.3* 3.1* 3.7 3.6 3.1* 3.6  CL 102  --  104 104 103 101  CO2 27  --  26 25 21* 20*  GLUCOSE 103*  --  102* 94 99 90  BUN 11  --  7* '8 9 9  '$ CREATININE 0.71  --  0.67 0.66 0.56 0.56  CALCIUM 7.8*  --  8.2* 8.3* 8.4* 8.2*  MG 1.8 2.7* 1.9 1.7 1.5* 1.8  PHOS 2.9 3.1 1.5* 2.8 2.4* 2.7   Liver Function Tests: Recent Labs  Lab 04/29/22 0301 04/29/22 1003 04/30/22 0455 05/01/22 0511 05/02/22 0550 05/03/22 0538  AST 84*  --  99* 96* 78* 59*  ALT 42  --  46* 51* 49* 43  ALKPHOS 164*  --  191* 204* 210* 194*  BILITOT 0.8  --  1.0 0.9 0.9 0.9  PROT 6.7  --  6.6 7.0 6.9 6.8  ALBUMIN 3.5 3.2* 3.6 3.8 3.4* 3.3*   Recent Labs  Lab 04/28/22 1030  LIPASE 196*   No results for input(s): "AMMONIA" in the last 168 hours. CBC: Recent Labs  Lab 04/28/22 1058 04/29/22 0301 04/30/22 0455 05/01/22 0511 05/02/22 0550 05/03/22 0538  WBC 11.8* 9.9 7.4 6.2 8.2 9.7  NEUTROABS 10.4*  --   --   --   --   --   HGB 11.0* 8.2* 8.3* 8.6* 9.0* 9.2*  HCT 33.6* 24.1* 25.7* 26.2* 27.3* 27.8*  MCV 108.4* 107.6* 110.3* 109.2* 107.5* 106.5*  PLT 429* 222 223 223 240 279   Cardiac Enzymes: No results for input(s): "CKTOTAL", "CKMB", "CKMBINDEX", "TROPONINI" in the last 168 hours. BNP: Invalid input(s): "POCBNP" CBG: Recent Labs  Lab 05/02/22 2021 05/02/22 2344 05/03/22 0319 05/03/22 0813 05/03/22 1148  GLUCAP 94 102* 98 114* 114*   D-Dimer No results for input(s): "DDIMER" in the last 72 hours. Hgb A1c No results for input(s): "HGBA1C" in the last 72 hours. Lipid Profile No results for input(s): "CHOL", "HDL", "LDLCALC", "TRIG", "CHOLHDL", "LDLDIRECT" in the last 72 hours. Thyroid function studies No results for input(s): "TSH", "T4TOTAL", "T3FREE", "THYROIDAB" in the last 72 hours.  Invalid input(s):  "  FREET3" Anemia work up No results for input(s): "VITAMINB12", "FOLATE", "FERRITIN", "TIBC", "IRON", "RETICCTPCT" in the last 72 hours. Urinalysis    Component Value Date/Time   COLORURINE STRAW (A) 04/28/2022 1705   APPEARANCEUR CLEAR 04/28/2022 1705   LABSPEC 1.029 04/28/2022 1705   PHURINE 6.0 04/28/2022 1705   GLUCOSEU NEGATIVE 04/28/2022 1705   HGBUR NEGATIVE 04/28/2022 1705   BILIRUBINUR NEGATIVE 04/28/2022 1705   BILIRUBINUR mod 07/24/2015 1220   KETONESUR 80 (A) 04/28/2022 1705   PROTEINUR NEGATIVE 04/28/2022 1705   UROBILINOGEN 0.2 10/01/2015 1351   NITRITE NEGATIVE 04/28/2022 1705   LEUKOCYTESUR NEGATIVE 04/28/2022 1705   Sepsis Labs Recent Labs  Lab 04/30/22 0455 05/01/22 0511 05/02/22 0550 05/03/22 0538  WBC 7.4 6.2 8.2 9.7   Microbiology Recent Results (from the past 240 hour(s))  Blood culture (routine x 2)     Status: None   Collection Time: 04/28/22 10:28 AM   Specimen: BLOOD  Result Value Ref Range Status   Specimen Description   Final    BLOOD BLOOD RIGHT ARM Performed at [facility], [place] [address], [place], [place] [address]    Special Requests   Final    BOTTLES DRAWN AEROBIC AND ANAEROBIC Blood Culture adequate volume Performed at [facility] [address], [place], [place] [address]    Culture   Final    NO GROWTH 5 DAYS Performed at [facility], [place] [address], [place], [place] [address]    Report Status 05/03/2022 FINAL  Final  Blood culture (routine x 2)     Status: None   Collection Time: 04/28/22 10:48 AM   Specimen: BLOOD  Result Value Ref Range Status   Specimen Description   Final    BLOOD BLOOD RIGHT HAND Performed at [facility] [address], [place], [place] [address]    Special Requests   Final    BOTTLES DRAWN AEROBIC AND ANAEROBIC Blood Culture results may not be optimal due to an excessive volume of blood received in culture bottles Performed at  [facility] [address], [place], [place] [address]    Culture   Final    NO GROWTH 5 DAYS Performed at [facility], [place] [address] [place], [place] [address]    Report Status 05/03/2022 FINAL  Final  MRSA Next Gen by PCR, Nasal     Status: None   Collection Time: 04/29/22 12:30 AM   Specimen: Nasal Mucosa; Nasal Swab  Result Value Ref Range Status   MRSA by PCR Next Gen NOT DETECTED NOT DETECTED Final    Comment: (NOTE) The GeneXpert MRSA Assay (FDA approved for NASAL specimens only), is one component of a comprehensive MRSA colonization surveillance program. It is not intended to diagnose MRSA infection nor to guide or monitor treatment for MRSA infections. Test performance is not FDA approved in patients less than 75 years old. Performed at [facility], [place] [address] [place], [place] [address]      Time coordinating discharge: Over 30 minutes  SIGNED:   Shawna Clamp, MD  [facility] 05/03/2022, 4:33 PM Pager   If 7PM-7AM, please contact night-coverage

## 2022-05-03 NOTE — Progress Notes (Signed)
Discharge instructions provided to patient. All medications, follow up appointments, and discharge instructions provided. IV out. Monitor off CCMD notified. Discharging home with family.  Era Bumpers, RN

## 2022-05-03 NOTE — Evaluation (Addendum)
Physical Therapy Evaluation Patient Details Name: Holly Phelps MRN: [id number] DOB: 09/30/1961 Today's Date: 05/03/2022  History of Present Illness  61 year old female with history of HLD, hypothyroidism, GERD, CVA, HTN presented with nausea and vomiting.  She also admits of drinking every evening due to stress.  Dx  of alcoholic  ketoacidosis.  Clinical Impression  Pt admitted with above diagnosis. Pt ambulated 61' with RW with shuffling gait, wide base of support, family reports this gait pattern is baseline. No loss of balance with ambulation. Family reports pt has had several falls in past 6 months, all related to drinking. Pt's family is planning to bring in private home aide to assist pt. They have all needed DME. HHPT recommended.  Pt currently with functional limitations due to the deficits listed below (see PT Problem List). Pt will benefit from skilled PT to increase their independence and safety with mobility to allow discharge to the venue listed below.          Recommendations for follow up therapy are one component of a multi-disciplinary discharge planning process, led by the attending physician.  Recommendations may be updated based on patient status, additional functional criteria and insurance authorization.  Follow Up Recommendations Home health PT    Assistance Recommended at Discharge Intermittent Supervision/Assistance  Patient can return home with the following  A little help with walking and/or transfers;A little help with bathing/dressing/bathroom;Assistance with cooking/housework;Direct supervision/assist for medications management;Assist for transportation;Help with stairs or ramp for entrance    Equipment Recommendations None recommended by PT  Recommendations for Other Services       Functional Status Assessment Patient has had a recent decline in their functional status and demonstrates the ability to make significant improvements in function in a reasonable  and predictable amount of time.     Precautions / Restrictions Precautions Precautions: Fall Precaution Comments: several falls in past 6 months related to drinking per spouse Restrictions Weight Bearing Restrictions: No      Mobility  Bed Mobility                    Transfers                        Ambulation/Gait Ambulation/Gait assistance: Min guard, Min assist Gait Distance (Feet): 35 Feet Assistive device: Rolling walker (2 wheels) Gait Pattern/deviations: Decreased stride length, Wide base of support, Shuffle Gait velocity: decr     General Gait Details: wide BOS, shuffling gait, pt was not able to increase step length with verbal cues to do so, min A for turns as pt had difficulty motor planning, spouse reports gait pattern is baseline  Science writer    Modified Rankin (Stroke Patients Only)       Balance                                             Pertinent Vitals/Pain Pain Assessment Faces Pain Scale: Hurts little more Pain Location: low back (chronic per spouse) Pain Descriptors / Indicators: Grimacing, Aching Pain Intervention(s): Limited activity within patient's tolerance, Monitored during session, Premedicated before session    Home Living Family/patient expects to be discharged to:: Private residence Living Arrangements: Spouse/significant other Available Help at Discharge: Family Type of Home: House Home Access: Ramped entrance;Stairs to enter  Entrance Stairs-Number of Steps: 1   Home Layout: One level Home Equipment: Conservation officer, nature (2 wheels);[place] - manual;Grab bars - toilet;Shower seat;Grab bars - tub/shower;Hand held shower head Additional Comments: spouse stated he has a bad knee so is limited in ability to assist pt, he is talking to a home agency about bringing in hired help for pt    Prior Function Prior Level of Function : Needs assist              Mobility Comments: walked with RW when going out, quad cane in home ADLs Comments: assist for bathing (reaching water knobs), independent dressing     Hand Dominance   Dominant Hand: Right    Extremity/Trunk Assessment   Upper Extremity Assessment Upper Extremity Assessment: Defer to OT evaluation RUE Deficits / Details: Functional ROM, grossly 4-/5 strength RUE Sensation: WNL RUE Coordination: WNL LUE Deficits / Details: Functional ROM, grossly 4-/5 strength LUE Sensation: WNL LUE Coordination: WNL    Lower Extremity Assessment Lower Extremity Assessment: Overall WFL for tasks assessed    Cervical / Trunk Assessment Cervical / Trunk Assessment: Normal  Communication   Communication: No difficulties  Cognition Arousal/Alertness: Awake/alert Behavior During Therapy: WFL for tasks assessed/performed, Flat affect Overall Cognitive Status: History of cognitive impairments - at baseline                                 General Comments: some difficulty following commands/motor planning        General Comments General comments (skin integrity, edema, etc.): double vision since TIA in December    Exercises     Assessment/Plan    PT Assessment Patient needs continued PT services  PT Problem List Decreased activity tolerance;Decreased mobility       PT Treatment Interventions Gait training;Therapeutic exercise;Functional mobility training;Patient/family education    PT Goals (Current goals can be found in the Care Plan section)  Acute Rehab PT Goals Patient Stated Goal: return home with hired help (spouse has a bad knee so ability to assist is limited) PT Goal Formulation: With family Time For Goal Achievement: 05/17/22 Potential to Achieve Goals: Fair    Frequency Min 3X/week     Co-evaluation PT/OT/SLP Co-Evaluation/Treatment: Yes Reason for Co-Treatment: To address functional/ADL transfers PT goals addressed during session:  Mobility/safety with mobility;Proper use of DME         AM-PAC PT "6 Clicks" Mobility  Outcome Measure Help needed turning from your back to your side while in a flat bed without using bedrails?: A Little Help needed moving from lying on your back to sitting on the side of a flat bed without using bedrails?: A Little Help needed moving to and from a bed to a chair (including a wheelchair)?: A Little Help needed standing up from a chair using your arms (e.g., wheelchair or bedside chair)?: A Little Help needed to walk in hospital room?: A Little Help needed climbing 3-5 steps with a railing? : A Little 6 Click Score: 18    End of Session Equipment Utilized During Treatment: Gait belt Activity Tolerance: Patient limited by fatigue Patient left: in chair;with family/visitor present;with call bell/phone within reach Nurse Communication: Mobility status PT Visit Diagnosis: Unsteadiness on feet (R26.81);Difficulty in walking, not elsewhere classified (R26.2)    Time: 7035-0093 PT Time Calculation (min) (ACUTE ONLY): 22 min   Charges:   PT Evaluation $PT Eval Moderate Complexity: 1 Mod  Blondell Reveal Kistler PT 05/03/2022  Acute Rehabilitation Services Pager [phone] Office [phone]

## 2022-05-03 NOTE — TOC Transition Note (Signed)
Transition of Care [facility]) - CM/SW Discharge Note   Patient Details  Name: Rubie Ficco MRN: [id number] Date of Birth: 03-Dec-1960  Transition of Care [facility]) CM/SW Contact:  Vassie Moselle, LCSW Phone Number: 05/03/2022, 11:37 AM   Clinical Narrative:    Met with pt and confirmed plans for pt to return home with HHPT/OT. Pt will receive HHPT/OT through [facility] and they will see pt at discharge. No DME needs identified. No further TOC needs identified.   Final next level of care: Home w Home Health Services Barriers to Discharge: Barriers Resolved   Patient Goals and CMS Choice Patient states their goals for this hospitalization and ongoing recovery are:: Return home      Discharge Placement                       Discharge Plan and Services     Post Acute Care Choice: NA          DME Arranged: N/A         HH Arranged: PT, OT HH Agency: [place] Date [facility] Agency Contacted: 05/03/22 Time [place]: [id number] Representative spoke with at [place]: [place] Interventions     Readmission Risk Interventions    05/03/2022   11:32 AM 04/30/2022   12:15 PM  Readmission Risk Prevention Plan  Transportation Screening Complete Complete  PCP or Specialist Appt within 5-7 Days Complete Complete  Home Care Screening Complete Complete  Medication Review (RN CM) Complete Complete

## 2022-05-08 DIAGNOSIS — Z87898 Personal history of other specified conditions: Secondary | ICD-10-CM | POA: Diagnosis not present

## 2022-05-23 DIAGNOSIS — R7989 Other specified abnormal findings of blood chemistry: Secondary | ICD-10-CM | POA: Diagnosis not present

## 2022-05-23 DIAGNOSIS — K760 Fatty (change of) liver, not elsewhere classified: Secondary | ICD-10-CM | POA: Diagnosis not present

## 2022-05-23 DIAGNOSIS — E43 Unspecified severe protein-calorie malnutrition: Secondary | ICD-10-CM | POA: Diagnosis not present

## 2022-05-23 DIAGNOSIS — R748 Abnormal levels of other serum enzymes: Secondary | ICD-10-CM | POA: Diagnosis not present

## 2022-06-04 DIAGNOSIS — D473 Essential (hemorrhagic) thrombocythemia: Secondary | ICD-10-CM | POA: Diagnosis not present

## 2022-06-04 DIAGNOSIS — E039 Hypothyroidism, unspecified: Secondary | ICD-10-CM | POA: Diagnosis not present

## 2022-06-20 DIAGNOSIS — H40013 Open angle with borderline findings, low risk, bilateral: Secondary | ICD-10-CM | POA: Diagnosis not present

## 2022-09-04 DIAGNOSIS — D473 Essential (hemorrhagic) thrombocythemia: Secondary | ICD-10-CM | POA: Diagnosis not present

## 2022-09-04 DIAGNOSIS — K760 Fatty (change of) liver, not elsewhere classified: Secondary | ICD-10-CM | POA: Diagnosis not present

## 2022-11-26 DIAGNOSIS — K760 Fatty (change of) liver, not elsewhere classified: Secondary | ICD-10-CM | POA: Diagnosis not present

## 2022-11-26 DIAGNOSIS — E43 Unspecified severe protein-calorie malnutrition: Secondary | ICD-10-CM | POA: Diagnosis not present

## 2022-11-26 DIAGNOSIS — R748 Abnormal levels of other serum enzymes: Secondary | ICD-10-CM | POA: Diagnosis not present

## 2022-11-26 DIAGNOSIS — R69 Illness, unspecified: Secondary | ICD-10-CM | POA: Diagnosis not present

## 2022-11-26 DIAGNOSIS — Z148 Genetic carrier of other disease: Secondary | ICD-10-CM | POA: Diagnosis not present

## 2023-02-11 DIAGNOSIS — E785 Hyperlipidemia, unspecified: Secondary | ICD-10-CM | POA: Diagnosis not present

## 2023-02-11 DIAGNOSIS — D473 Essential (hemorrhagic) thrombocythemia: Secondary | ICD-10-CM | POA: Diagnosis not present

## 2023-02-11 DIAGNOSIS — D509 Iron deficiency anemia, unspecified: Secondary | ICD-10-CM | POA: Diagnosis not present

## 2023-02-13 ENCOUNTER — Other Ambulatory Visit: Payer: Self-pay | Admitting: Family Medicine

## 2023-02-13 DIAGNOSIS — Z13820 Encounter for screening for osteoporosis: Secondary | ICD-10-CM

## 2023-02-18 DIAGNOSIS — H40013 Open angle with borderline findings, low risk, bilateral: Secondary | ICD-10-CM | POA: Diagnosis not present

## 2023-03-18 DIAGNOSIS — Z1211 Encounter for screening for malignant neoplasm of colon: Secondary | ICD-10-CM | POA: Diagnosis not present

## 2023-03-18 DIAGNOSIS — I10 Essential (primary) hypertension: Secondary | ICD-10-CM | POA: Diagnosis not present

## 2023-05-14 DIAGNOSIS — E46 Unspecified protein-calorie malnutrition: Secondary | ICD-10-CM | POA: Diagnosis not present

## 2023-05-14 DIAGNOSIS — Z720 Tobacco use: Secondary | ICD-10-CM | POA: Diagnosis not present

## 2023-05-14 DIAGNOSIS — F411 Generalized anxiety disorder: Secondary | ICD-10-CM | POA: Diagnosis not present

## 2023-05-14 DIAGNOSIS — R636 Underweight: Secondary | ICD-10-CM | POA: Diagnosis not present

## 2023-06-10 DIAGNOSIS — R410 Disorientation, unspecified: Secondary | ICD-10-CM | POA: Diagnosis not present

## 2023-06-10 DIAGNOSIS — R0902 Hypoxemia: Secondary | ICD-10-CM | POA: Diagnosis not present

## 2023-06-10 DIAGNOSIS — S0990XA Unspecified injury of head, initial encounter: Secondary | ICD-10-CM | POA: Diagnosis not present

## 2023-06-10 DIAGNOSIS — R6889 Other general symptoms and signs: Secondary | ICD-10-CM | POA: Diagnosis not present

## 2023-06-10 DIAGNOSIS — R55 Syncope and collapse: Secondary | ICD-10-CM | POA: Diagnosis not present

## 2023-06-11 ENCOUNTER — Emergency Department (HOSPITAL_COMMUNITY)
Admission: EM | Admit: 2023-06-11 | Discharge: 2023-06-11 | Disposition: A | Discharge status: Home / Self Care | Payer: 59 | Attending: Emergency Medicine | Admitting: Emergency Medicine

## 2023-06-11 ENCOUNTER — Other Ambulatory Visit: Payer: Self-pay

## 2023-06-11 ENCOUNTER — Emergency Department (HOSPITAL_COMMUNITY): Payer: 59

## 2023-06-11 DIAGNOSIS — S0990XA Unspecified injury of head, initial encounter: Secondary | ICD-10-CM | POA: Diagnosis not present

## 2023-06-11 DIAGNOSIS — W19XXXA Unspecified fall, initial encounter: Secondary | ICD-10-CM | POA: Diagnosis not present

## 2023-06-11 DIAGNOSIS — F10920 Alcohol use, unspecified with intoxication, uncomplicated: Secondary | ICD-10-CM

## 2023-06-11 DIAGNOSIS — Z7982 Long term (current) use of aspirin: Secondary | ICD-10-CM | POA: Insufficient documentation

## 2023-06-11 DIAGNOSIS — Z981 Arthrodesis status: Secondary | ICD-10-CM | POA: Diagnosis not present

## 2023-06-11 DIAGNOSIS — F10220 Alcohol dependence with intoxication, uncomplicated: Secondary | ICD-10-CM | POA: Diagnosis not present

## 2023-06-11 DIAGNOSIS — R4182 Altered mental status, unspecified: Secondary | ICD-10-CM | POA: Diagnosis not present

## 2023-06-11 DIAGNOSIS — E039 Hypothyroidism, unspecified: Secondary | ICD-10-CM | POA: Diagnosis not present

## 2023-06-11 DIAGNOSIS — Z79899 Other long term (current) drug therapy: Secondary | ICD-10-CM | POA: Diagnosis not present

## 2023-06-11 DIAGNOSIS — M5030 Other cervical disc degeneration, unspecified cervical region: Secondary | ICD-10-CM | POA: Diagnosis not present

## 2023-06-11 DIAGNOSIS — M4312 Spondylolisthesis, cervical region: Secondary | ICD-10-CM | POA: Diagnosis not present

## 2023-06-11 DIAGNOSIS — F10129 Alcohol abuse with intoxication, unspecified: Secondary | ICD-10-CM | POA: Diagnosis not present

## 2023-06-11 DIAGNOSIS — I10 Essential (primary) hypertension: Secondary | ICD-10-CM | POA: Diagnosis not present

## 2023-06-11 DIAGNOSIS — F1410 Cocaine abuse, uncomplicated: Secondary | ICD-10-CM | POA: Diagnosis not present

## 2023-06-11 DIAGNOSIS — S199XXA Unspecified injury of neck, initial encounter: Secondary | ICD-10-CM | POA: Diagnosis not present

## 2023-06-11 LAB — ETHANOL: Alcohol, Ethyl (B): 307 mg/dL (ref <10)

## 2023-06-11 LAB — SALICYLATE LEVEL: Salicylate Lvl: <7 mg/dL — ABNORMAL LOW (ref 7.0–30.0)

## 2023-06-11 LAB — COMPREHENSIVE METABOLIC PANEL
ALT: 26 U/L (ref 0–44)
AST: 39 U/L (ref 15–41)
Albumin: 4.3 g/dL (ref 3.5–5.0)
Alkaline Phosphatase: 129 U/L — ABNORMAL HIGH (ref 38–126)
Anion gap: 12 (ref 5–15)
BUN: 14 mg/dL (ref 8–23)
CO2: 24 mmol/L (ref 22–32)
Calcium: 9.7 mg/dL (ref 8.9–10.3)
Chloride: 104 mmol/L (ref 98–111)
Creatinine, Ser: 0.85 mg/dL (ref 0.44–1.00)
GFR, Estimated: >60 mL/min (ref >60)
Glucose, Bld: 105 mg/dL — ABNORMAL HIGH (ref 70–99)
Potassium: 3.5 mmol/L (ref 3.5–5.1)
Sodium: 140 mmol/L (ref 135–145)
Total Bilirubin: 0.1 mg/dL — ABNORMAL LOW (ref 0.3–1.2)
Total Protein: 7.6 g/dL (ref 6.5–8.1)

## 2023-06-11 LAB — ACETAMINOPHEN LEVEL: Acetaminophen (Tylenol), Serum: <10 ug/mL — ABNORMAL LOW (ref 10–30)

## 2023-06-11 LAB — CBC WITH DIFFERENTIAL/PLATELET
Abs Immature Granulocytes: 0.07 10*3/uL (ref 0.00–0.07)
Basophils Absolute: 0.1 10*3/uL (ref 0.0–0.1)
Basophils Relative: 1 %
Eosinophils Absolute: 0.1 10*3/uL (ref 0.0–0.5)
Eosinophils Relative: 1 %
HCT: 32.6 % — ABNORMAL LOW (ref 36.0–46.0)
Hemoglobin: 10.7 g/dL — ABNORMAL LOW (ref 12.0–15.0)
Immature Granulocytes: 1 %
Lymphocytes Relative: 15 %
Lymphs Abs: 1.5 10*3/uL (ref 0.7–4.0)
MCH: 32.4 pg (ref 26.0–34.0)
MCHC: 32.8 g/dL (ref 30.0–36.0)
MCV: 98.8 fL (ref 80.0–100.0)
Monocytes Absolute: 0.9 10*3/uL (ref 0.1–1.0)
Monocytes Relative: 9 %
Neutro Abs: 7.2 10*3/uL (ref 1.7–7.7)
Neutrophils Relative %: 73 %
Platelets: 292 10*3/uL (ref 150–400)
RBC: 3.3 MIL/uL — ABNORMAL LOW (ref 3.87–5.11)
RDW: 13.6 % (ref 11.5–15.5)
WBC: 9.8 10*3/uL (ref 4.0–10.5)
nRBC: 0 % (ref 0.0–0.2)

## 2023-06-11 LAB — CBG MONITORING, ED: Glucose-Capillary: 95 mg/dL (ref 70–99)

## 2023-06-11 LAB — RAPID URINE DRUG SCREEN, HOSP PERFORMED
Amphetamines: NOT DETECTED
Barbiturates: NOT DETECTED
Benzodiazepines: NOT DETECTED
Cocaine: POSITIVE — AB
Opiates: NOT DETECTED
Tetrahydrocannabinol: NOT DETECTED

## 2023-06-11 MED ORDER — SODIUM CHLORIDE 0.9 % IV BOLUS
500.0000 mL | Freq: Once | INTRAVENOUS | Status: AC
  Administered 2023-06-11: 500 mL via INTRAVENOUS

## 2023-06-11 MED ORDER — ONDANSETRON HCL 4 MG/2ML IJ SOLN
4.0000 mg | Freq: Once | INTRAMUSCULAR | Status: AC
  Administered 2023-06-11: 4 mg via INTRAVENOUS
  Filled 2023-06-11: qty 2

## 2023-06-11 NOTE — ED Notes (Signed)
Called spouse X2 to try and get pt a ride back home. No answer.

## 2023-06-11 NOTE — ED Provider Notes (Signed)
EMERGENCY DEPARTMENT AT [facility] Provider Note   CSN: [id number] Arrival date & time: 06/11/23  0013     History  Chief Complaint  Patient presents with   Altered Mental Status   Fall    Holly Phelps is a 62 y.o. female.  The history is provided by the EMS personnel, the spouse and the patient.  Fall This is a new problem. The current episode started 3 to 5 hours ago. The problem occurs rarely. The problem has been resolved. Pertinent negatives include no chest pain, no abdominal pain, no headaches and no shortness of breath. Nothing aggravates the symptoms. Nothing relieves the symptoms. She has tried nothing for the symptoms. The treatment provided no relief.  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 Authorizing Provider  acetaminophen (TYLENOL) 500 MG tablet Take 1,000 mg by mouth every 6 (six) hours as needed.    [provider]  allopurinol (ZYLOPRIM) 100 MG tablet Take 1 tablet (100 mg total) by mouth daily. Patient not taking: Reported on 11/09/2021 10/26/20   Janeece Agee, NP  aspirin 81 MG chewable tablet Chew 1 tablet (81 mg total) by mouth daily. 11/12/21   Glade Lloyd, MD  atorvastatin (LIPITOR) 40 MG tablet Take 1 tablet (40 mg total) by mouth daily. 11/12/21   Glade Lloyd, MD  atorvastatin (LIPITOR) 40 MG tablet Take 40 mg by mouth daily. 04/18/22   [provider]  COVID-19 mRNA bivalent vaccine, Pfizer, (PFIZER COVID-19 VAC BIVALENT) injection Inject into the muscle. 12/08/21   Judyann Munson, MD  famotidine (PEPCID) 20 MG tablet Take 1 tablet (20 mg total) by mouth daily as needed for  heartburn. 11/11/21   Glade Lloyd, MD  famotidine (PEPCID) 20 MG tablet Take 20 mg by mouth at bedtime as needed for heartburn or indigestion. 04/18/22   [provider]  FERROUS GLUCONATE PO Take 240 mg by mouth in the morning and at bedtime.    [provider]  folic acid (FOLVITE) 1 MG tablet Take 1 tablet (1 mg total) by mouth daily. 11/12/21   Glade Lloyd, MD  levothyroxine (SYNTHROID) 25 MCG tablet Take 25 mcg by mouth daily. 04/22/22   [provider]  metoprolol tartrate (LOPRESSOR) 25 MG tablet Take 1 tablet (25 mg total) by mouth 2 (two) times daily. 05/03/22   Willeen Niece, MD  Multiple Vitamins-Minerals (MULTIVITAMIN ADULTS PO) Take 1 tablet by mouth daily.    [provider]  Polyethylene Glycol 400 (VISINE DRY EYE RELIEF) 1 % SOLN Place 1 drop into both eyes daily as needed (dry eyes).    [provider]  thiamine 100 MG tablet Take 1 tablet (100 mg total) by mouth daily. 11/12/21   Glade Lloyd, MD      Allergies    Codeine, Flexeril [cyclobenzaprine], Sulfa antibiotics, Tramadol, and Synthroid [levothyroxine]    Review of Systems   Review of Systems  Constitutional:  Negative for fever.  HENT:  Negative for congestion.   Respiratory:  Negative for shortness of breath.  Cardiovascular:  Negative for chest pain.  Gastrointestinal:  Negative for abdominal pain.  Neurological:  Negative for headaches.  All other systems reviewed and are negative.   Physical Exam Updated Vital Signs BP 106/68   Pulse 79   Temp (!) 97.4 F (36.3 C) (Oral)   Resp 18   Wt 54.4 kg   SpO2 97%   BMI 21.95 kg/m  Physical Exam Vitals and nursing note reviewed.  Constitutional:      General: She is not in acute distress.    Appearance: Normal appearance. She is well-developed.  HENT:     Head: Normocephalic and atraumatic.     Nose: Nose normal.  Eyes:     Pupils: Pupils are equal, round, and reactive to light.  Cardiovascular:      Rate and Rhythm: Normal rate and regular rhythm.     Pulses: Normal pulses.     Heart sounds: Normal heart sounds.  Pulmonary:     Effort: Pulmonary effort is normal. No respiratory distress.     Breath sounds: Normal breath sounds.  Abdominal:     General: Bowel sounds are normal. There is no distension.     Palpations: Abdomen is soft.     Tenderness: There is no abdominal tenderness. There is no guarding or rebound.  Genitourinary:    Vagina: No vaginal discharge.  Musculoskeletal:        General: Normal range of motion.     Cervical back: Normal range of motion and neck supple.  Skin:    General: Skin is warm and dry.     Capillary Refill: Capillary refill takes less than 2 seconds.     Findings: No erythema or rash.  Neurological:     General: No focal deficit present.     Mental Status: She is alert.     Deep Tendon Reflexes: Reflexes normal.  Psychiatric:        Mood and Affect: Mood normal.     ED Results / Procedures / Treatments   Labs (all labs ordered are listed, but only abnormal results are displayed) Results for orders placed or performed during the hospital encounter of 06/11/23  Comprehensive metabolic panel  Result Value Ref Range   Sodium 140 135 - 145 mmol/L   Potassium 3.5 3.5 - 5.1 mmol/L   Chloride 104 98 - 111 mmol/L   CO2 24 22 - 32 mmol/L   Glucose, Bld 105 (H) 70 - 99 mg/dL   BUN 14 8 - 23 mg/dL   Creatinine, Ser 0.16 0.44 - 1.00 mg/dL   Calcium 9.7 8.9 - 01.0 mg/dL   Total Protein 7.6 6.5 - 8.1 g/dL   Albumin 4.3 3.5 - 5.0 g/dL   AST 39 15 - 41 U/L   ALT 26 0 - 44 U/L   Alkaline Phosphatase 129 (H) 38 - 126 U/L   Total Bilirubin 0.1 (L) 0.3 - 1.2 mg/dL   GFR, Estimated >93 >23 mL/min   Anion gap 12 5 - 15  Salicylate level  Result Value Ref Range   Salicylate Lvl <7.0 (L) 7.0 - 30.0 mg/dL  Acetaminophen level  Result Value Ref Range   Acetaminophen (Tylenol), Serum <10 (L) 10 - 30 ug/mL  Ethanol  Result Value Ref Range   Alcohol,  Ethyl (B) 307 (HH) <10 mg/dL  CBG monitoring, ED  Result Value Ref Range   Glucose-Capillary 95 70 - 99 mg/dL   CT Cervical Spine Wo Contrast  Result Date: 06/11/2023 CLINICAL DATA:  Polytrauma, blunt.  Fall. EXAM: CT CERVICAL SPINE WITHOUT CONTRAST TECHNIQUE: Multidetector CT imaging of the cervical spine was performed without intravenous contrast. Multiplanar CT image reconstructions were also generated. RADIATION DOSE REDUCTION: This exam was performed according to the departmental dose-optimization program which includes automated exposure control, adjustment of the mA and/or kV according to patient size and/or use of iterative reconstruction technique. COMPARISON:  None Available. FINDINGS: Alignment: Slight degenerative anterolisthesis of C2 on C3 and C7 on T1. Skull base and vertebrae: No acute fracture. No primary bone lesion or focal pathologic process. Soft tissues and spinal canal: No prevertebral fluid or swelling. No visible canal hematoma. Disc levels: Prior anterior fusion C3-C5. Moderate degenerative disc disease and advanced bilateral degenerative facet disease, left greater than right. Upper chest: No acute findings Other: None IMPRESSION: Postoperative and moderate to advanced degenerative disc/facet disease. No acute bony abnormality. Electronically Signed   By: Charlett Nose M.D.   On: 06/11/2023 01:47   CT Head Wo Contrast  Result Date: 06/11/2023 CLINICAL DATA:  Polytrauma, blunt.  Fall. EXAM: CT HEAD WITHOUT CONTRAST TECHNIQUE: Contiguous axial images were obtained from the base of the skull through the vertex without intravenous contrast. RADIATION DOSE REDUCTION: This exam was performed according to the departmental dose-optimization program which includes automated exposure control, adjustment of the mA and/or kV according to patient size and/or use of iterative reconstruction technique. COMPARISON:  11/09/2021 FINDINGS: Brain: There is age-advanced atrophy and chronic small  vessel disease changes. No acute intracranial abnormality. Specifically, no hemorrhage, hydrocephalus, mass lesion, acute infarction, or significant intracranial injury. Vascular: No hyperdense vessel or unexpected calcification. Skull: No acute calvarial abnormality. Sinuses/Orbits: No acute findings Other: None IMPRESSION: Atrophy, chronic microvascular disease. No acute intracranial abnormality. Electronically Signed   By: Charlett Nose M.D.   On: 06/11/2023 01:46     EKG  EKG Interpretation Date/Time:  Tuesday June 11 2023 00:28:10 EDT Ventricular Rate:  85 PR Interval:  163 QRS Duration:  90 QT Interval:  408 QTC Calculation: 486 R Axis:   -39  Text Interpretation: Sinus rhythm Left axis deviation Abnormal R-wave progression, early transition Confirmed by Marni Franzoni ([id number]) on 06/11/2023 3:49:03 AM         Radiology CT Cervical Spine Wo Contrast  Result Date: 06/11/2023 CLINICAL DATA:  Polytrauma, blunt.  Fall. EXAM: CT CERVICAL SPINE WITHOUT CONTRAST TECHNIQUE: Multidetector CT imaging of the cervical spine was performed without intravenous contrast. Multiplanar CT image reconstructions were also generated. RADIATION DOSE REDUCTION: This exam was performed according to the departmental dose-optimization program which includes automated exposure control, adjustment of the mA and/or kV according to patient size and/or use of iterative reconstruction technique. COMPARISON:  None Available. FINDINGS: Alignment: Slight degenerative anterolisthesis of C2 on C3 and C7 on T1. Skull base and vertebrae: No acute fracture. No primary bone lesion or focal pathologic process. Soft tissues and spinal canal: No prevertebral fluid or swelling. No visible canal hematoma. Disc levels: Prior anterior fusion C3-C5. Moderate degenerative disc disease and advanced bilateral degenerative facet disease, left greater than right. Upper chest: No acute findings Other: None IMPRESSION: Postoperative and moderate  to advanced degenerative disc/facet disease. No acute bony abnormality. Electronically Signed   By: Charlett Nose M.D.   On: 06/11/2023 01:47   CT Head Wo Contrast  Result Date: 06/11/2023 CLINICAL DATA:  Polytrauma, blunt.  Fall. EXAM: CT HEAD WITHOUT CONTRAST TECHNIQUE: Contiguous axial images were obtained from the base of the skull through the vertex without intravenous contrast. RADIATION  DOSE REDUCTION: This exam was performed according to the departmental dose-optimization program which includes automated exposure control, adjustment of the mA and/or kV according to patient size and/or use of iterative reconstruction technique. COMPARISON:  11/09/2021 FINDINGS: Brain: There is age-advanced atrophy and chronic small vessel disease changes. No acute intracranial abnormality. Specifically, no hemorrhage, hydrocephalus, mass lesion, acute infarction, or significant intracranial injury. Vascular: No hyperdense vessel or unexpected calcification. Skull: No acute calvarial abnormality. Sinuses/Orbits: No acute findings Other: None IMPRESSION: Atrophy, chronic microvascular disease. No acute intracranial abnormality. Electronically Signed   By: Charlett Nose M.D.   On: 06/11/2023 01:46    Procedures Procedures    Medications Ordered in ED Medications  ondansetron (ZOFRAN) injection 4 mg (4 mg Intravenous Given 06/11/23 0059)  sodium chloride 0.9 % bolus 500 mL (0 mLs Intravenous Stopped 06/11/23 0154)    ED Course/ Medical Decision Making/ A&P                             Medical Decision Making Patient who fell, smells of ETOH  Amount and/or Complexity of Data Reviewed Independent Historian: EMS    Details: See above  External Data Reviewed: notes.    Details: Previous notes reviewed  Labs: ordered.    Details: Alcohol markedly positive 307, negative tylenol and salicylate level. Normal sodium 140, normal potassium 3.5, normal creatinine .33  Radiology: ordered and independent interpretation  performed.    Details: Negative head CT by me  ECG/medicine tests: ordered and independent interpretation performed. Decision-making details documented in ED Course.  Risk Prescription drug management. Risk Details: Patient is sleeping comfortably in the Room.  Observed.  Stable for discharge into the care of her husband post Po challenge.     Final Clinical Impression(s) / ED Diagnoses Final diagnoses:  Fall, initial encounter  Alcoholic intoxication without complication (HCC)   Return for intractable cough, coughing up blood, fevers > 100.4 unrelieved by medication, shortness of breath, intractable vomiting, chest pain, shortness of breath, weakness, numbness, changes in speech, facial asymmetry, abdominal pain, passing out, Inability to tolerate liquids or food, cough, altered mental status or any concerns. No signs of systemic illness or infection. The patient is nontoxic-appearing on exam and vital signs are within normal limits.  I have reviewed the triage vital signs and the nursing notes. Pertinent labs & imaging results that were available during my care of the patient were reviewed by me and considered in my medical decision making (see chart for details). After history, exam, and medical workup I feel the patient has been appropriately medically screened and is safe for discharge home. Pertinent diagnoses were discussed with the patient. Patient was given return precautions.  Rx / DC Orders ED Discharge Orders     None         Fusako Tanabe, MD 06/11/23 [phone]

## 2023-06-11 NOTE — ED Triage Notes (Signed)
Pt BIB [facility] for fall that happened around 2130 tonight. Husband found pt on the floor with altered mental status. Pt at baseline A&OX4 but now only oriented to person and place. Noted confused. No blood thinners. In c-collar on arrival to ED. CBG 116 en route.

## 2023-06-11 NOTE — ED Notes (Signed)
Refused to be cathed for UA and unable to get urine sample from patient.

## 2023-06-11 NOTE — ED Notes (Signed)
Pt is a&ox4, laying in the bed with a blanket covering her. Pt currently has no complaints. Side rails up x 2. Pt is close to RN station should she need anything.

## 2023-06-11 NOTE — ED Notes (Signed)
Tried calling husband Molly Maduro @ [phone]. No answer, left a voicemail.

## 2023-06-14 DIAGNOSIS — M25511 Pain in right shoulder: Secondary | ICD-10-CM | POA: Diagnosis not present

## 2023-06-14 DIAGNOSIS — F1010 Alcohol abuse, uncomplicated: Secondary | ICD-10-CM | POA: Diagnosis not present

## 2023-06-14 DIAGNOSIS — F1410 Cocaine abuse, uncomplicated: Secondary | ICD-10-CM | POA: Diagnosis not present

## 2023-06-14 DIAGNOSIS — M25521 Pain in right elbow: Secondary | ICD-10-CM | POA: Diagnosis not present

## 2023-06-14 DIAGNOSIS — W19XXXA Unspecified fall, initial encounter: Secondary | ICD-10-CM | POA: Diagnosis not present

## 2023-07-03 DIAGNOSIS — M25511 Pain in right shoulder: Secondary | ICD-10-CM | POA: Diagnosis not present

## 2023-07-03 DIAGNOSIS — M25521 Pain in right elbow: Secondary | ICD-10-CM | POA: Diagnosis not present

## 2023-07-15 DIAGNOSIS — M25511 Pain in right shoulder: Secondary | ICD-10-CM | POA: Diagnosis not present

## 2023-07-24 DIAGNOSIS — M25521 Pain in right elbow: Secondary | ICD-10-CM | POA: Diagnosis not present

## 2023-07-24 DIAGNOSIS — M25511 Pain in right shoulder: Secondary | ICD-10-CM | POA: Diagnosis not present

## 2023-08-21 DIAGNOSIS — M25521 Pain in right elbow: Secondary | ICD-10-CM | POA: Diagnosis not present

## 2023-08-21 DIAGNOSIS — S5011XA Contusion of right forearm, initial encounter: Secondary | ICD-10-CM | POA: Diagnosis not present

## 2023-08-21 DIAGNOSIS — M25511 Pain in right shoulder: Secondary | ICD-10-CM | POA: Diagnosis not present

## 2023-11-28 DIAGNOSIS — E039 Hypothyroidism, unspecified: Secondary | ICD-10-CM | POA: Diagnosis not present

## 2023-11-28 DIAGNOSIS — D539 Nutritional anemia, unspecified: Secondary | ICD-10-CM | POA: Diagnosis not present

## 2023-11-28 DIAGNOSIS — D649 Anemia, unspecified: Secondary | ICD-10-CM | POA: Diagnosis not present

## 2023-11-29 DIAGNOSIS — F1910 Other psychoactive substance abuse, uncomplicated: Secondary | ICD-10-CM | POA: Diagnosis not present

## 2023-11-29 DIAGNOSIS — D539 Nutritional anemia, unspecified: Secondary | ICD-10-CM | POA: Diagnosis not present

## 2023-11-29 DIAGNOSIS — I10 Essential (primary) hypertension: Secondary | ICD-10-CM | POA: Diagnosis not present

## 2023-11-29 DIAGNOSIS — D509 Iron deficiency anemia, unspecified: Secondary | ICD-10-CM | POA: Diagnosis not present

## 2023-11-29 DIAGNOSIS — E785 Hyperlipidemia, unspecified: Secondary | ICD-10-CM | POA: Diagnosis not present

## 2023-11-29 DIAGNOSIS — F411 Generalized anxiety disorder: Secondary | ICD-10-CM | POA: Diagnosis not present

## 2023-11-29 DIAGNOSIS — D649 Anemia, unspecified: Secondary | ICD-10-CM | POA: Diagnosis not present

## 2023-11-29 DIAGNOSIS — E46 Unspecified protein-calorie malnutrition: Secondary | ICD-10-CM | POA: Diagnosis not present

## 2023-11-29 DIAGNOSIS — E039 Hypothyroidism, unspecified: Secondary | ICD-10-CM | POA: Diagnosis not present

## 2023-11-29 DIAGNOSIS — K219 Gastro-esophageal reflux disease without esophagitis: Secondary | ICD-10-CM | POA: Diagnosis not present

## 2023-11-29 DIAGNOSIS — R000 Tachycardia, unspecified: Secondary | ICD-10-CM | POA: Diagnosis not present

## 2023-11-29 DIAGNOSIS — Z23 Encounter for immunization: Secondary | ICD-10-CM | POA: Diagnosis not present

## 2023-12-02 DIAGNOSIS — L089 Local infection of the skin and subcutaneous tissue, unspecified: Secondary | ICD-10-CM | POA: Diagnosis not present

## 2024-03-10 DIAGNOSIS — H40013 Open angle with borderline findings, low risk, bilateral: Secondary | ICD-10-CM | POA: Diagnosis not present

## 2024-05-06 DIAGNOSIS — M25511 Pain in right shoulder: Secondary | ICD-10-CM | POA: Diagnosis not present

## 2024-05-18 DIAGNOSIS — S42201A Unspecified fracture of upper end of right humerus, initial encounter for closed fracture: Secondary | ICD-10-CM | POA: Diagnosis not present

## 2024-05-19 DIAGNOSIS — X58XXXA Exposure to other specified factors, initial encounter: Secondary | ICD-10-CM | POA: Diagnosis not present

## 2024-05-19 DIAGNOSIS — G8918 Other acute postprocedural pain: Secondary | ICD-10-CM | POA: Diagnosis not present

## 2024-05-19 DIAGNOSIS — S42221A 2-part displaced fracture of surgical neck of right humerus, initial encounter for closed fracture: Secondary | ICD-10-CM | POA: Diagnosis not present

## 2024-05-19 DIAGNOSIS — Y999 Unspecified external cause status: Secondary | ICD-10-CM | POA: Diagnosis not present

## 2024-05-26 DIAGNOSIS — E785 Hyperlipidemia, unspecified: Secondary | ICD-10-CM | POA: Diagnosis not present

## 2024-05-26 DIAGNOSIS — D649 Anemia, unspecified: Secondary | ICD-10-CM | POA: Diagnosis not present

## 2024-06-01 DIAGNOSIS — Z4789 Encounter for other orthopedic aftercare: Secondary | ICD-10-CM | POA: Diagnosis not present

## 2024-06-11 DIAGNOSIS — Z1211 Encounter for screening for malignant neoplasm of colon: Secondary | ICD-10-CM | POA: Diagnosis not present

## 2024-07-01 DIAGNOSIS — D649 Anemia, unspecified: Secondary | ICD-10-CM | POA: Diagnosis not present

## 2024-07-13 DIAGNOSIS — Z4789 Encounter for other orthopedic aftercare: Secondary | ICD-10-CM | POA: Diagnosis not present

## 2024-07-13 DIAGNOSIS — S42201A Unspecified fracture of upper end of right humerus, initial encounter for closed fracture: Secondary | ICD-10-CM | POA: Diagnosis not present

## 2024-08-06 DIAGNOSIS — D649 Anemia, unspecified: Secondary | ICD-10-CM | POA: Diagnosis not present

## 2024-08-06 DIAGNOSIS — K700 Alcoholic fatty liver: Secondary | ICD-10-CM | POA: Diagnosis not present

## 2024-08-06 DIAGNOSIS — R195 Other fecal abnormalities: Secondary | ICD-10-CM | POA: Diagnosis not present

## 2024-08-11 ENCOUNTER — Other Ambulatory Visit: Payer: Self-pay | Admitting: Student

## 2024-08-11 DIAGNOSIS — K760 Fatty (change of) liver, not elsewhere classified: Secondary | ICD-10-CM

## 2024-08-20 DIAGNOSIS — K760 Fatty (change of) liver, not elsewhere classified: Secondary | ICD-10-CM | POA: Diagnosis not present

## 2024-08-25 DIAGNOSIS — K5730 Diverticulosis of large intestine without perforation or abscess without bleeding: Secondary | ICD-10-CM | POA: Diagnosis not present

## 2024-08-25 DIAGNOSIS — K449 Diaphragmatic hernia without obstruction or gangrene: Secondary | ICD-10-CM | POA: Diagnosis not present

## 2024-08-25 DIAGNOSIS — R195 Other fecal abnormalities: Secondary | ICD-10-CM | POA: Diagnosis not present

## 2024-08-25 DIAGNOSIS — K5520 Angiodysplasia of colon without hemorrhage: Secondary | ICD-10-CM | POA: Diagnosis not present

## 2024-08-25 DIAGNOSIS — D649 Anemia, unspecified: Secondary | ICD-10-CM | POA: Diagnosis not present

## 2024-08-25 DIAGNOSIS — Z1211 Encounter for screening for malignant neoplasm of colon: Secondary | ICD-10-CM | POA: Diagnosis not present

## 2024-11-04 ENCOUNTER — Other Ambulatory Visit: Payer: Self-pay | Admitting: Nurse Practitioner

## 2024-11-04 DIAGNOSIS — F1010 Alcohol abuse, uncomplicated: Secondary | ICD-10-CM

## 2024-11-04 DIAGNOSIS — K760 Fatty (change of) liver, not elsewhere classified: Secondary | ICD-10-CM

## 2024-11-04 DIAGNOSIS — F1090 Alcohol use, unspecified, uncomplicated: Secondary | ICD-10-CM

## 2024-11-04 DIAGNOSIS — Z148 Genetic carrier of other disease: Secondary | ICD-10-CM

## 2024-11-04 DIAGNOSIS — R748 Abnormal levels of other serum enzymes: Secondary | ICD-10-CM

## 2024-12-04 ENCOUNTER — Other Ambulatory Visit
